# Patient Record
Sex: Female | Born: 1975
Health system: Southern US, Community
[De-identification: ages and names within clinical notes are randomized; demographics above are authoritative.]

## PROBLEM LIST (undated history)

## (undated) DIAGNOSIS — R87619 Unspecified abnormal cytological findings in specimens from cervix uteri: Secondary | ICD-10-CM

## (undated) DIAGNOSIS — Z87442 Personal history of urinary calculi: Secondary | ICD-10-CM

## (undated) DIAGNOSIS — G43909 Migraine, unspecified, not intractable, without status migrainosus: Secondary | ICD-10-CM

## (undated) DIAGNOSIS — E2740 Unspecified adrenocortical insufficiency: Secondary | ICD-10-CM

## (undated) DIAGNOSIS — C55 Malignant neoplasm of uterus, part unspecified: Secondary | ICD-10-CM

## (undated) DIAGNOSIS — M797 Fibromyalgia: Secondary | ICD-10-CM

## (undated) DIAGNOSIS — F329 Major depressive disorder, single episode, unspecified: Secondary | ICD-10-CM

## (undated) DIAGNOSIS — F32A Depression, unspecified: Secondary | ICD-10-CM

## (undated) DIAGNOSIS — K769 Liver disease, unspecified: Secondary | ICD-10-CM

## (undated) DIAGNOSIS — K219 Gastro-esophageal reflux disease without esophagitis: Secondary | ICD-10-CM

## (undated) DIAGNOSIS — D219 Benign neoplasm of connective and other soft tissue, unspecified: Secondary | ICD-10-CM

## (undated) DIAGNOSIS — N809 Endometriosis, unspecified: Secondary | ICD-10-CM

## (undated) DIAGNOSIS — R091 Pleurisy: Secondary | ICD-10-CM

## (undated) DIAGNOSIS — E7800 Pure hypercholesterolemia, unspecified: Secondary | ICD-10-CM

## (undated) DIAGNOSIS — R55 Syncope and collapse: Secondary | ICD-10-CM

## (undated) DIAGNOSIS — M1990 Unspecified osteoarthritis, unspecified site: Secondary | ICD-10-CM

## (undated) DIAGNOSIS — F419 Anxiety disorder, unspecified: Secondary | ICD-10-CM

## (undated) DIAGNOSIS — C439 Malignant melanoma of skin, unspecified: Secondary | ICD-10-CM

## (undated) DIAGNOSIS — R569 Unspecified convulsions: Secondary | ICD-10-CM

## (undated) HISTORY — DX: Unspecified convulsions: R56.9

## (undated) HISTORY — DX: Unspecified abnormal cytological findings in specimens from cervix uteri: R87.619

## (undated) HISTORY — DX: Unspecified osteoarthritis, unspecified site: M19.90

## (undated) HISTORY — DX: Benign neoplasm of connective and other soft tissue, unspecified: D21.9

## (undated) HISTORY — PX: ABDOMINAL HYSTERECTOMY: SHX81

## (undated) HISTORY — PX: LAPAROSCOPY: SHX197

## (undated) HISTORY — DX: Liver disease, unspecified: K76.9

## (undated) HISTORY — DX: Syncope and collapse: R55

## (undated) HISTORY — DX: Gastro-esophageal reflux disease without esophagitis: K21.9

## (undated) HISTORY — PX: COLECTOMY: SHX59

## (undated) HISTORY — PX: DILATION AND CURETTAGE OF UTERUS: SHX78

## (undated) HISTORY — PX: LAPAROSCOPIC GASTRIC SLEEVE RESECTION: SHX5895

## (undated) HISTORY — PX: ABLATION: SHX5711

## (undated) HISTORY — DX: Anxiety disorder, unspecified: F41.9

## (undated) HISTORY — DX: Pure hypercholesterolemia, unspecified: E78.00

## (undated) HISTORY — DX: Malignant neoplasm of uterus, part unspecified: C55

## (undated) HISTORY — DX: Pleurisy: R09.1

---

## 1998-05-13 ENCOUNTER — Other Ambulatory Visit: Admission: RE | Admit: 1998-05-13 | Discharge: 1998-05-13 | Payer: Self-pay | Admitting: Obstetrics and Gynecology

## 1998-10-03 HISTORY — PX: MELANOMA EXCISION: SHX5266

## 1999-06-02 ENCOUNTER — Other Ambulatory Visit: Admission: RE | Admit: 1999-06-02 | Discharge: 1999-06-02 | Payer: Self-pay | Admitting: Obstetrics and Gynecology

## 1999-09-06 ENCOUNTER — Encounter (INDEPENDENT_AMBULATORY_CARE_PROVIDER_SITE_OTHER): Payer: Self-pay

## 1999-09-06 ENCOUNTER — Ambulatory Visit (HOSPITAL_COMMUNITY): Admission: RE | Admit: 1999-09-06 | Discharge: 1999-09-06 | Payer: Self-pay | Admitting: Obstetrics and Gynecology

## 2000-03-04 ENCOUNTER — Emergency Department (HOSPITAL_COMMUNITY): Admission: EM | Admit: 2000-03-04 | Discharge: 2000-03-04 | Payer: Self-pay | Admitting: Emergency Medicine

## 2000-06-21 ENCOUNTER — Other Ambulatory Visit: Admission: RE | Admit: 2000-06-21 | Discharge: 2000-06-21 | Payer: Self-pay | Admitting: *Deleted

## 2001-07-03 ENCOUNTER — Other Ambulatory Visit: Admission: RE | Admit: 2001-07-03 | Discharge: 2001-07-03 | Payer: Self-pay | Admitting: Obstetrics and Gynecology

## 2002-01-26 ENCOUNTER — Inpatient Hospital Stay (HOSPITAL_COMMUNITY): Admission: AD | Admit: 2002-01-26 | Discharge: 2002-01-30 | Payer: Self-pay | Admitting: Obstetrics and Gynecology

## 2002-01-31 ENCOUNTER — Encounter: Admission: RE | Admit: 2002-01-31 | Discharge: 2002-03-02 | Payer: Self-pay | Admitting: Obstetrics and Gynecology

## 2002-03-03 ENCOUNTER — Encounter: Admission: RE | Admit: 2002-03-03 | Discharge: 2002-04-02 | Payer: Self-pay | Admitting: Obstetrics and Gynecology

## 2002-03-05 ENCOUNTER — Other Ambulatory Visit: Admission: RE | Admit: 2002-03-05 | Discharge: 2002-03-05 | Payer: Self-pay | Admitting: Obstetrics and Gynecology

## 2002-05-03 ENCOUNTER — Encounter: Admission: RE | Admit: 2002-05-03 | Discharge: 2002-06-02 | Payer: Self-pay | Admitting: Obstetrics and Gynecology

## 2002-11-01 ENCOUNTER — Other Ambulatory Visit: Admission: RE | Admit: 2002-11-01 | Discharge: 2002-11-01 | Payer: Self-pay | Admitting: Obstetrics and Gynecology

## 2003-11-28 ENCOUNTER — Other Ambulatory Visit: Admission: RE | Admit: 2003-11-28 | Discharge: 2003-11-28 | Payer: Self-pay | Admitting: Obstetrics and Gynecology

## 2005-02-15 ENCOUNTER — Other Ambulatory Visit: Admission: RE | Admit: 2005-02-15 | Discharge: 2005-02-15 | Payer: Self-pay | Admitting: *Deleted

## 2006-04-25 ENCOUNTER — Other Ambulatory Visit: Admission: RE | Admit: 2006-04-25 | Discharge: 2006-04-25 | Payer: Self-pay | Admitting: *Deleted

## 2008-07-29 ENCOUNTER — Ambulatory Visit: Payer: Self-pay

## 2008-08-05 ENCOUNTER — Ambulatory Visit: Payer: Self-pay

## 2008-08-20 ENCOUNTER — Ambulatory Visit: Payer: Self-pay

## 2008-09-03 ENCOUNTER — Ambulatory Visit: Payer: Self-pay

## 2008-09-10 ENCOUNTER — Ambulatory Visit: Payer: Self-pay

## 2008-09-17 ENCOUNTER — Ambulatory Visit: Payer: Self-pay

## 2008-09-24 ENCOUNTER — Ambulatory Visit: Payer: Self-pay

## 2008-11-18 ENCOUNTER — Ambulatory Visit: Payer: Self-pay

## 2011-01-29 ENCOUNTER — Emergency Department (HOSPITAL_COMMUNITY)
Admission: EM | Admit: 2011-01-29 | Discharge: 2011-01-29 | Disposition: A | Discharge status: Home / Self Care | Payer: Self-pay | Attending: Emergency Medicine | Admitting: Emergency Medicine

## 2011-01-29 ENCOUNTER — Emergency Department (HOSPITAL_COMMUNITY): Payer: Self-pay

## 2011-01-29 DIAGNOSIS — R42 Dizziness and giddiness: Secondary | ICD-10-CM | POA: Insufficient documentation

## 2011-01-29 DIAGNOSIS — S52123A Displaced fracture of head of unspecified radius, initial encounter for closed fracture: Secondary | ICD-10-CM | POA: Insufficient documentation

## 2011-01-29 DIAGNOSIS — IMO0002 Reserved for concepts with insufficient information to code with codable children: Secondary | ICD-10-CM | POA: Insufficient documentation

## 2011-01-29 DIAGNOSIS — S0083XA Contusion of other part of head, initial encounter: Secondary | ICD-10-CM | POA: Insufficient documentation

## 2011-01-29 DIAGNOSIS — S0003XA Contusion of scalp, initial encounter: Secondary | ICD-10-CM | POA: Insufficient documentation

## 2011-01-29 DIAGNOSIS — Y92009 Unspecified place in unspecified non-institutional (private) residence as the place of occurrence of the external cause: Secondary | ICD-10-CM | POA: Insufficient documentation

## 2011-01-29 DIAGNOSIS — Z23 Encounter for immunization: Secondary | ICD-10-CM | POA: Insufficient documentation

## 2011-01-29 DIAGNOSIS — W11XXXA Fall on and from ladder, initial encounter: Secondary | ICD-10-CM | POA: Insufficient documentation

## 2011-01-29 DIAGNOSIS — R51 Headache: Secondary | ICD-10-CM | POA: Insufficient documentation

## 2011-02-02 ENCOUNTER — Other Ambulatory Visit: Payer: Self-pay | Admitting: Orthopedic Surgery

## 2011-02-02 DIAGNOSIS — M25532 Pain in left wrist: Secondary | ICD-10-CM

## 2011-02-11 ENCOUNTER — Ambulatory Visit (HOSPITAL_COMMUNITY): Payer: Self-pay | Admitting: Specialist

## 2011-02-18 NOTE — H&P (Signed)
[facility]  Patient:    Denise Avila, Denise Avila Visit Number: [id number] MRN: [id number]          Service Type: OBS Location: 910A [id number] Attending Physician:  Genia Del Dictated by:   Lenoard Aden, M.D. Admit Date:  01/26/2002   CC:         [facility]   History and Physical  CHIEF COMPLAINT:              Worsening low back pain and worsening situational depression for induction.  HISTORY OF PRESENT ILLNESS:   The patient is a 35 year old white female, G2, P0, EDC Feb 09, 2002, at 38 weeks who presents for aforementioned indications for induction.  PAST MEDICAL HISTORY:         Remarkable for cryosurgery x 2 in 1994 and 1996, questionable history of mild endometriosis.  ALLERGIES:                    CODEINE.  MEDICATIONS:                  Prenatal vitamins and Celexa 60 mg q.d.  OB/GYN HISTORY:               History of spontaneous abortion in January 2001 without a D&E.  History of laparoscopy in December 2000 with questionable diagnosis of endometriosis.  FAMILY HISTORY:               Diabetes, heart disease, and liver cancer.  PRENATAL LABORATORY DATA:     Blood type A negative, Rh antibody negative, toxoplasmosis titer negative, VDRL nonreactive, rubella immune, hepatitis B surface antigen negative, HIV nonreactive, GC and chlamydia negative.  Group B strep performed at 35 weeks was also negative.  PREGNANCY HISTORY:            Pregnancy was complicated by size/date discrepancy, cryosurgery to the cervix, and multiple domestic situational issues to include terminal illness and death of her father who died during the pregnancy which has been a worsening problem during her pregnancy.  PHYSICAL EXAMINATION:  GENERAL:                      Well-developed, well-nourished white female in no apparent distress.  HEENT:                        Normal.  LUNGS:                        Clear.  ABDOMEN:                       Soft, gravid, nontender.  Estimated fetal weight 7-1/2 to 8 pounds.  PELVIC:                       No CVA tenderness.  Cervix is closed, 3 cm long, firm, vertex, -2.  EXTREMITIES:                  No cords.  NEUROLOGIC:                   Nonfocal.  IMPRESSION:                   1. Intrauterine pregnancy at 38 weeks.  2. Worsening situational depression on Celexa.                               3. History of cryosurgery.                               4. Unfavorable cervix.  PLAN:                         Proceed with cervical ripening in the form of Cervidil.  Attempt at vaginal delivery.  RhoGAM as needed postpartum. Increased C section risk.  Indication includes worsening emotional situation for the patient and her family which preclude the possible risk of C section and necessitate need for induction and attempted vaginal delivery. Dictated by:   Lenoard Aden, M.D. Attending Physician:  Genia Del DD:  01/25/02 TD:  01/25/02 Job: [id number]

## 2011-02-24 ENCOUNTER — Encounter (INDEPENDENT_AMBULATORY_CARE_PROVIDER_SITE_OTHER): Payer: Self-pay | Admitting: Obstetrics & Gynecology

## 2011-02-24 DIAGNOSIS — Z3480 Encounter for supervision of other normal pregnancy, unspecified trimester: Secondary | ICD-10-CM

## 2011-02-24 DIAGNOSIS — O359XX0 Maternal care for (suspected) fetal abnormality and damage, unspecified, not applicable or unspecified: Secondary | ICD-10-CM

## 2011-03-24 ENCOUNTER — Encounter: Payer: Self-pay | Admitting: Obstetrics & Gynecology

## 2011-05-18 ENCOUNTER — Encounter: Payer: Private Health Insurance - Indemnity | Admitting: Obstetrics & Gynecology

## 2011-07-20 ENCOUNTER — Other Ambulatory Visit: Payer: Self-pay | Admitting: *Deleted

## 2011-07-20 NOTE — Progress Notes (Signed)
Incision is clean and dry.  There is a small open area in the middle with a slight amount of clear drainage.  Patient is reassured.

## 2011-08-22 ENCOUNTER — Ambulatory Visit: Payer: Self-pay | Admitting: Obstetrics & Gynecology

## 2011-08-22 ENCOUNTER — Ambulatory Visit: Payer: Private Health Insurance - Indemnity | Admitting: Obstetrics & Gynecology

## 2012-02-06 DIAGNOSIS — Z0000 Encounter for general adult medical examination without abnormal findings: Secondary | ICD-10-CM | POA: Insufficient documentation

## 2014-12-02 DIAGNOSIS — R87619 Unspecified abnormal cytological findings in specimens from cervix uteri: Secondary | ICD-10-CM

## 2014-12-02 HISTORY — DX: Unspecified abnormal cytological findings in specimens from cervix uteri: R87.619

## 2014-12-08 ENCOUNTER — Other Ambulatory Visit: Payer: Self-pay | Admitting: Physician Assistant

## 2014-12-08 ENCOUNTER — Other Ambulatory Visit (HOSPITAL_COMMUNITY)
Admission: RE | Admit: 2014-12-08 | Discharge: 2014-12-08 | Disposition: A | Discharge status: Home / Self Care | Payer: BLUE CROSS/BLUE SHIELD | Source: Ambulatory Visit | Attending: Physician Assistant | Admitting: Physician Assistant

## 2014-12-08 DIAGNOSIS — Z124 Encounter for screening for malignant neoplasm of cervix: Secondary | ICD-10-CM | POA: Insufficient documentation

## 2014-12-08 DIAGNOSIS — Z1151 Encounter for screening for human papillomavirus (HPV): Secondary | ICD-10-CM | POA: Insufficient documentation

## 2014-12-09 LAB — CYTOLOGY - PAP

## 2015-02-01 ENCOUNTER — Encounter (HOSPITAL_COMMUNITY): Payer: Self-pay

## 2015-02-01 ENCOUNTER — Inpatient Hospital Stay (HOSPITAL_COMMUNITY)
Admission: EM | Admit: 2015-02-01 | Discharge: 2015-02-10 | DRG: 872 | Disposition: A | Discharge status: Home / Self Care | Payer: BLUE CROSS/BLUE SHIELD | Attending: Family Medicine | Admitting: Family Medicine

## 2015-02-01 DIAGNOSIS — N201 Calculus of ureter: Secondary | ICD-10-CM

## 2015-02-01 DIAGNOSIS — N202 Calculus of kidney with calculus of ureter: Secondary | ICD-10-CM | POA: Diagnosis present

## 2015-02-01 DIAGNOSIS — A4151 Sepsis due to Escherichia coli [E. coli]: Principal | ICD-10-CM | POA: Diagnosis present

## 2015-02-01 DIAGNOSIS — T402X5A Adverse effect of other opioids, initial encounter: Secondary | ICD-10-CM | POA: Diagnosis not present

## 2015-02-01 DIAGNOSIS — G43909 Migraine, unspecified, not intractable, without status migrainosus: Secondary | ICD-10-CM | POA: Diagnosis present

## 2015-02-01 DIAGNOSIS — T368X5A Adverse effect of other systemic antibiotics, initial encounter: Secondary | ICD-10-CM | POA: Diagnosis not present

## 2015-02-01 DIAGNOSIS — N200 Calculus of kidney: Secondary | ICD-10-CM

## 2015-02-01 DIAGNOSIS — K521 Toxic gastroenteritis and colitis: Secondary | ICD-10-CM | POA: Diagnosis not present

## 2015-02-01 DIAGNOSIS — R52 Pain, unspecified: Secondary | ICD-10-CM

## 2015-02-01 DIAGNOSIS — R091 Pleurisy: Secondary | ICD-10-CM

## 2015-02-01 DIAGNOSIS — N1330 Unspecified hydronephrosis: Secondary | ICD-10-CM | POA: Diagnosis present

## 2015-02-01 DIAGNOSIS — K5909 Other constipation: Secondary | ICD-10-CM | POA: Diagnosis not present

## 2015-02-01 DIAGNOSIS — Z880 Allergy status to penicillin: Secondary | ICD-10-CM

## 2015-02-01 DIAGNOSIS — N12 Tubulo-interstitial nephritis, not specified as acute or chronic: Secondary | ICD-10-CM | POA: Diagnosis present

## 2015-02-01 DIAGNOSIS — N3080 Other cystitis without hematuria: Secondary | ICD-10-CM | POA: Diagnosis present

## 2015-02-01 DIAGNOSIS — R1032 Left lower quadrant pain: Secondary | ICD-10-CM | POA: Diagnosis not present

## 2015-02-01 DIAGNOSIS — E876 Hypokalemia: Secondary | ICD-10-CM | POA: Diagnosis not present

## 2015-02-01 DIAGNOSIS — D696 Thrombocytopenia, unspecified: Secondary | ICD-10-CM | POA: Diagnosis present

## 2015-02-01 HISTORY — DX: Endometriosis, unspecified: N80.9

## 2015-02-01 HISTORY — DX: Pleurisy: R09.1

## 2015-02-01 LAB — URINALYSIS, ROUTINE W REFLEX MICROSCOPIC
Bilirubin Urine: NEGATIVE
Glucose, UA: NEGATIVE mg/dL
Hgb urine dipstick: NEGATIVE
Ketones, ur: >80 mg/dL — AB
Nitrite: NEGATIVE
PH: 8.5 — AB (ref 5.0–8.0)
PROTEIN: 30 mg/dL — AB
SPECIFIC GRAVITY, URINE: 1.022 (ref 1.005–1.030)
Urobilinogen, UA: 1 mg/dL (ref 0.0–1.0)

## 2015-02-01 LAB — COMPREHENSIVE METABOLIC PANEL
ALT: 20 U/L (ref 14–54)
ANION GAP: 10 (ref 5–15)
AST: 20 U/L (ref 15–41)
Albumin: 4 g/dL (ref 3.5–5.0)
Alkaline Phosphatase: 62 U/L (ref 38–126)
BUN: 20 mg/dL (ref 6–20)
CALCIUM: 9.1 mg/dL (ref 8.9–10.3)
CO2: 19 mmol/L — AB (ref 22–32)
CREATININE: 1.07 mg/dL — AB (ref 0.44–1.00)
Chloride: 109 mmol/L (ref 101–111)
Glucose, Bld: 139 mg/dL — ABNORMAL HIGH (ref 70–99)
Potassium: 3.7 mmol/L (ref 3.5–5.1)
SODIUM: 138 mmol/L (ref 135–145)
TOTAL PROTEIN: 7.1 g/dL (ref 6.5–8.1)
Total Bilirubin: 1 mg/dL (ref 0.3–1.2)

## 2015-02-01 LAB — CBC WITH DIFFERENTIAL/PLATELET
Basophils Absolute: 0 10*3/uL (ref 0.0–0.1)
Basophils Relative: 0 % (ref 0–1)
EOS PCT: 0 % (ref 0–5)
Eosinophils Absolute: 0 10*3/uL (ref 0.0–0.7)
HEMATOCRIT: 38.8 % (ref 36.0–46.0)
Hemoglobin: 13.5 g/dL (ref 12.0–15.0)
LYMPHS ABS: 0.4 10*3/uL — AB (ref 0.7–4.0)
LYMPHS PCT: 3 % — AB (ref 12–46)
MCH: 29.9 pg (ref 26.0–34.0)
MCHC: 34.8 g/dL (ref 30.0–36.0)
MCV: 85.8 fL (ref 78.0–100.0)
MONO ABS: 0.8 10*3/uL (ref 0.1–1.0)
Monocytes Relative: 6 % (ref 3–12)
Neutro Abs: 13 10*3/uL — ABNORMAL HIGH (ref 1.7–7.7)
Neutrophils Relative %: 91 % — ABNORMAL HIGH (ref 43–77)
Platelets: 165 10*3/uL (ref 150–400)
RBC: 4.52 MIL/uL (ref 3.87–5.11)
RDW: 12.5 % (ref 11.5–15.5)
WBC: 14.3 10*3/uL — AB (ref 4.0–10.5)

## 2015-02-01 LAB — URINE MICROSCOPIC-ADD ON

## 2015-02-01 LAB — LIPASE, BLOOD: Lipase: 26 U/L (ref 22–51)

## 2015-02-01 LAB — POC URINE PREG, ED: Preg Test, Ur: NEGATIVE

## 2015-02-01 MED ORDER — SODIUM CHLORIDE 0.9 % IV BOLUS (SEPSIS)
1000.0000 mL | Freq: Once | INTRAVENOUS | Status: AC
  Administered 2015-02-01: 1000 mL via INTRAVENOUS

## 2015-02-01 MED ORDER — ONDANSETRON HCL 4 MG/2ML IJ SOLN: 4.0000 mg | Freq: Once | INTRAMUSCULAR | Status: DC

## 2015-02-01 MED ORDER — HYDROMORPHONE HCL 1 MG/ML IJ SOLN
1.0000 mg | Freq: Once | INTRAMUSCULAR | Status: AC
  Administered 2015-02-01: 1 mg via INTRAVENOUS
  Filled 2015-02-01: qty 1

## 2015-02-01 MED ORDER — PROMETHAZINE HCL 25 MG/ML IJ SOLN
12.5000 mg | Freq: Once | INTRAMUSCULAR | Status: AC
  Administered 2015-02-01: 12.5 mg via INTRAVENOUS
  Filled 2015-02-01: qty 1

## 2015-02-01 MED ORDER — ONDANSETRON 4 MG PO TBDP
8.0000 mg | ORAL_TABLET | Freq: Once | ORAL | Status: AC
  Administered 2015-02-01: 8 mg via ORAL

## 2015-02-01 MED ORDER — ONDANSETRON 4 MG PO TBDP
ORAL_TABLET | ORAL | Status: AC
  Filled 2015-02-01: qty 2

## 2015-02-01 MED ORDER — IOHEXOL 300 MG/ML  SOLN: 25.0000 mL | INTRAMUSCULAR | Status: DC

## 2015-02-01 NOTE — ED Notes (Addendum)
Pt here for abd pain and emesis today. The abd pain has been bothering her the past two weeks denies any diarrhea. Denies pregnancy. Goes to MD tomorrow to discuss hysterectomy. Pt does have endometriosis and this is worse.

## 2015-02-01 NOTE — ED Provider Notes (Signed)
CSN: [id number]     Arrival date & time 02/01/15  1658 History   First MD Initiated Contact with Patient 02/01/15 1909     Chief Complaint  Patient presents with  . Abdominal Pain  . Emesis      HPI Pt here for abd pain and emesis today. The abd pain has been bothering her the past two weeks denies any diarrhea. Denies pregnancy. Goes to MD tomorrow to discuss hysterectomy. Pt does have endometriosis and this is worse.  Past Medical History  Diagnosis Date  . Endometriosis    Past Surgical History  Procedure Laterality Date  . Ablation      uterine  . Dilation and curettage of uterus     No family history on file. History  Substance Use Topics  . Smoking status: Never Smoker   . Smokeless tobacco: Not on file  . Alcohol Use: No   OB History    No data available     Review of Systems  Constitutional: Negative for fever.  Gastrointestinal: Positive for nausea, vomiting and abdominal pain. Negative for constipation and blood in stool.      Allergies  Penicillins  Home Medications   Prior to Admission medications   Not on File   BP 113/59 mmHg  Pulse 70  Temp(Src) 99.7 F (37.6 C) (Oral)  Resp 18  SpO2 97%  LMP 12/23/2014 Physical Exam  Constitutional: She is oriented to person, place, and time. She appears well-developed and well-nourished. No distress.  HENT:  Head: Normocephalic and atraumatic.  Eyes: Pupils are equal, round, and reactive to light.  Neck: Normal range of motion.  Cardiovascular: Normal rate and intact distal pulses.   Pulmonary/Chest: No respiratory distress.  Abdominal: Normal appearance. She exhibits no distension. There is tenderness in the left lower quadrant. There is no rigidity, no rebound and no guarding.  Musculoskeletal: Normal range of motion.  Neurological: She is alert and oriented to person, place, and time. No cranial nerve deficit.  Skin: Skin is warm and dry. No rash noted.  Psychiatric: She has a normal mood and  affect. Her behavior is normal.  Nursing note and vitals reviewed.   ED Course  Procedures (including critical care time)  Medications    HYDROmorphone (DILAUDID) injection 1 mg (not administered)  sodium chloride 0.9 % bolus 1,000 mL (not administered)  promethazine (PHENERGAN) injection 12.5 mg (not administered)  ondansetron (ZOFRAN-ODT) disintegrating tablet 8 mg (8 mg Oral Given 02/01/15 1854)    Labs Review Labs Reviewed  CBC WITH DIFFERENTIAL/PLATELET - Abnormal; Notable for the following:    WBC 14.3 (*)    Neutrophils Relative % 91 (*)    Neutro Abs 13.0 (*)    Lymphocytes Relative 3 (*)    Lymphs Abs 0.4 (*)    All other components within normal limits  COMPREHENSIVE METABOLIC PANEL - Abnormal; Notable for the following:    CO2 19 (*)    Glucose, Bld 139 (*)    Creatinine, Ser 1.07 (*)    All other components within normal limits  URINALYSIS, ROUTINE W REFLEX MICROSCOPIC - Abnormal; Notable for the following:    APPearance CLOUDY (*)    pH 8.5 (*)    Ketones, ur >80 (*)    Protein, ur 30 (*)    Leukocytes, UA MODERATE (*)    All other components within normal limits  URINE MICROSCOPIC-ADD ON - Abnormal; Notable for the following:    Bacteria, UA MANY (*)    All  other components within normal limits  URINE CULTURE  LIPASE, BLOOD  POC URINE PREG, ED  POC URINE PREG, ED    Imaging Review Ct Abdomen Pelvis W Contrast  02/02/2015   CLINICAL DATA:  Endometriosis, pelvic pain. Hysterectomy scheduled for tomorrow.  EXAM: CT ABDOMEN AND PELVIS WITH CONTRAST  TECHNIQUE: Multidetector CT imaging of the abdomen and pelvis was performed using the standard protocol following bolus administration of intravenous contrast.  CONTRAST:  155mL OMNIPAQUE IOHEXOL 300 MG/ML  SOLN  COMPARISON:  None.  FINDINGS: LUNG BASES: Included view of the lung bases are clear. Visualized heart and pericardium are unremarkable.  SOLID ORGANS: The liver, gallbladder, pancreas and adrenal glands are  unremarkable. 12 mm hypodensity in the spleen may reflect a cyst or lymphangioma, spleen is otherwise unremarkable.  GASTROINTESTINAL TRACT: Tiny hiatal hernia. The stomach, small and large bowel are normal in course and caliber without inflammatory changes. Enteric contrast has not yet reached the distal small bowel. Normal appendix.  KIDNEYS/ URINARY TRACT: Kidneys are orthotopic. Two delayed nephrogram on the LEFT, with mild hydroureteronephrosis and urothelial enhancement to the level of the pelvis. 6 mm calcification in the LEFT pelvis may reflect urolithiasis. Too small to characterize hypodensities in RIGHT kidney. No RIGHT obstructive uropathy. Multiple small calcifications the LEFT pelvis.  PERITONEUM/RETROPERITONEUM: Aortoiliac vessels are normal in course and caliber. No lymphadenopathy by CT size criteria. Peripherally enhancing 2.3 x 3.2 cm LEFT adnexal low low-density lesion. Small to moderate round of low-density free fluid in the pelvis without intraperitoneal free air.  SOFT TISSUE/OSSEOUS STRUCTURES: Non-suspicious. Genital piercing. Small fat containing umbilical hernia.  IMPRESSION: Small to moderate amount of free fluid in the pelvis with suspected collapsing, ruptured LEFT adnexal 3.2 cm cyst. This would be better characterized on pelvic sonogram.  LEFT pyelonephritis, mild hydronephrosis into the level of the distal ureter where a 6 mm calcification is seen, likely reflecting obstructing urolithiasis, less likely phlebolith.   Electronically Signed   By: Elon Alas   On: 02/02/2015 00:46   Spoke with Dr. Junious Silk from urology who wants the patient transferred to [facility] long emergency department.  Patient will be stented and then admitted for acute pyelonephritis.  MDM   Final diagnoses:  Pain  Pyelonephritis  Left ureteral stone        Leonard Schwartz, MD 02/02/15 [phone]

## 2015-02-01 NOTE — ED Notes (Signed)
Bedside toilet placed at bedside. Pt ambulated to bedside toilet with this RN

## 2015-02-02 ENCOUNTER — Inpatient Hospital Stay (HOSPITAL_COMMUNITY): Payer: BLUE CROSS/BLUE SHIELD | Admitting: Anesthesiology

## 2015-02-02 ENCOUNTER — Encounter (HOSPITAL_COMMUNITY)
Admission: EM | Disposition: A | Discharge status: Home / Self Care | Payer: Self-pay | Source: Home / Self Care | Attending: Internal Medicine

## 2015-02-02 ENCOUNTER — Emergency Department (HOSPITAL_COMMUNITY): Payer: BLUE CROSS/BLUE SHIELD

## 2015-02-02 ENCOUNTER — Encounter (HOSPITAL_COMMUNITY): Payer: Self-pay | Admitting: Radiology

## 2015-02-02 DIAGNOSIS — N12 Tubulo-interstitial nephritis, not specified as acute or chronic: Secondary | ICD-10-CM

## 2015-02-02 DIAGNOSIS — A4151 Sepsis due to Escherichia coli [E. coli]: Secondary | ICD-10-CM | POA: Diagnosis present

## 2015-02-02 DIAGNOSIS — N1330 Unspecified hydronephrosis: Secondary | ICD-10-CM | POA: Diagnosis present

## 2015-02-02 DIAGNOSIS — N1339 Other hydronephrosis: Secondary | ICD-10-CM | POA: Diagnosis not present

## 2015-02-02 DIAGNOSIS — G43809 Other migraine, not intractable, without status migrainosus: Secondary | ICD-10-CM | POA: Diagnosis not present

## 2015-02-02 DIAGNOSIS — K5909 Other constipation: Secondary | ICD-10-CM | POA: Diagnosis not present

## 2015-02-02 DIAGNOSIS — R1032 Left lower quadrant pain: Secondary | ICD-10-CM | POA: Diagnosis present

## 2015-02-02 DIAGNOSIS — K521 Toxic gastroenteritis and colitis: Secondary | ICD-10-CM | POA: Diagnosis not present

## 2015-02-02 DIAGNOSIS — N3080 Other cystitis without hematuria: Secondary | ICD-10-CM | POA: Diagnosis present

## 2015-02-02 DIAGNOSIS — G43909 Migraine, unspecified, not intractable, without status migrainosus: Secondary | ICD-10-CM | POA: Diagnosis present

## 2015-02-02 DIAGNOSIS — Z880 Allergy status to penicillin: Secondary | ICD-10-CM | POA: Diagnosis not present

## 2015-02-02 DIAGNOSIS — N132 Hydronephrosis with renal and ureteral calculous obstruction: Secondary | ICD-10-CM

## 2015-02-02 DIAGNOSIS — N201 Calculus of ureter: Secondary | ICD-10-CM | POA: Diagnosis not present

## 2015-02-02 DIAGNOSIS — N200 Calculus of kidney: Secondary | ICD-10-CM

## 2015-02-02 DIAGNOSIS — E876 Hypokalemia: Secondary | ICD-10-CM | POA: Diagnosis not present

## 2015-02-02 DIAGNOSIS — T368X5A Adverse effect of other systemic antibiotics, initial encounter: Secondary | ICD-10-CM | POA: Diagnosis not present

## 2015-02-02 DIAGNOSIS — D696 Thrombocytopenia, unspecified: Secondary | ICD-10-CM | POA: Diagnosis present

## 2015-02-02 DIAGNOSIS — T402X5A Adverse effect of other opioids, initial encounter: Secondary | ICD-10-CM | POA: Diagnosis not present

## 2015-02-02 DIAGNOSIS — N202 Calculus of kidney with calculus of ureter: Secondary | ICD-10-CM | POA: Diagnosis present

## 2015-02-02 HISTORY — PX: CYSTOSCOPY W/ URETERAL STENT PLACEMENT: SHX1429

## 2015-02-02 LAB — CBC
HCT: 35.3 % — ABNORMAL LOW (ref 36.0–46.0)
Hemoglobin: 11.8 g/dL — ABNORMAL LOW (ref 12.0–15.0)
MCH: 29.8 pg (ref 26.0–34.0)
MCHC: 33.4 g/dL (ref 30.0–36.0)
MCV: 89.1 fL (ref 78.0–100.0)
PLATELETS: 145 10*3/uL — AB (ref 150–400)
RBC: 3.96 MIL/uL (ref 3.87–5.11)
RDW: 12.7 % (ref 11.5–15.5)
WBC: 20.4 10*3/uL — ABNORMAL HIGH (ref 4.0–10.5)

## 2015-02-02 LAB — BASIC METABOLIC PANEL
ANION GAP: 4 — AB (ref 5–15)
BUN: 22 mg/dL — AB (ref 6–20)
CO2: 21 mmol/L — ABNORMAL LOW (ref 22–32)
Calcium: 8.1 mg/dL — ABNORMAL LOW (ref 8.9–10.3)
Chloride: 110 mmol/L (ref 101–111)
Creatinine, Ser: 0.83 mg/dL (ref 0.44–1.00)
GFR calc Af Amer: >60 mL/min (ref >60)
GFR calc non Af Amer: >60 mL/min (ref >60)
Glucose, Bld: 135 mg/dL — ABNORMAL HIGH (ref 70–99)
Potassium: 3.5 mmol/L (ref 3.5–5.1)
Sodium: 135 mmol/L (ref 135–145)

## 2015-02-02 SURGERY — CYSTOSCOPY, WITH RETROGRADE PYELOGRAM AND URETERAL STENT INSERTION
Anesthesia: General | Site: Ureter | Laterality: Left

## 2015-02-02 MED ORDER — OXYCODONE-ACETAMINOPHEN 5-325 MG PO TABS
1.0000 | ORAL_TABLET | Freq: Four times a day (QID) | ORAL | Status: DC | PRN
  Administered 2015-02-02: 2 via ORAL
  Administered 2015-02-02 – 2015-02-03 (×3): 1 via ORAL
  Filled 2015-02-02: qty 2
  Filled 2015-02-02 (×2): qty 1
  Filled 2015-02-02: qty 2
  Filled 2015-02-02: qty 1

## 2015-02-02 MED ORDER — HYDROMORPHONE HCL 1 MG/ML IJ SOLN
0.5000 mg | INTRAMUSCULAR | Status: DC | PRN
  Administered 2015-02-02 – 2015-02-03 (×2): 0.5 mg via INTRAVENOUS
  Filled 2015-02-02 (×2): qty 1

## 2015-02-02 MED ORDER — FENTANYL CITRATE (PF) 100 MCG/2ML IJ SOLN
INTRAMUSCULAR | Status: DC | PRN
  Administered 2015-02-02: 50 ug via INTRAVENOUS
  Administered 2015-02-02 (×2): 25 ug via INTRAVENOUS
  Administered 2015-02-02: 50 ug via INTRAVENOUS

## 2015-02-02 MED ORDER — PROPOFOL 10 MG/ML IV BOLUS
INTRAVENOUS | Status: AC
  Filled 2015-02-02: qty 20

## 2015-02-02 MED ORDER — TAMSULOSIN HCL 0.4 MG PO CAPS
0.4000 mg | ORAL_CAPSULE | Freq: Every day | ORAL | Status: DC
  Administered 2015-02-02 – 2015-02-09 (×8): 0.4 mg via ORAL
  Filled 2015-02-02 (×9): qty 1

## 2015-02-02 MED ORDER — SODIUM CHLORIDE 0.9 % IV SOLN
INTRAVENOUS | Status: DC
  Administered 2015-02-05 – 2015-02-08 (×5): via INTRAVENOUS

## 2015-02-02 MED ORDER — PROPOFOL 10 MG/ML IV BOLUS
INTRAVENOUS | Status: DC | PRN
  Administered 2015-02-02: 170 mg via INTRAVENOUS

## 2015-02-02 MED ORDER — LACTATED RINGERS IV SOLN: INTRAVENOUS | Status: DC

## 2015-02-02 MED ORDER — FENTANYL CITRATE (PF) 100 MCG/2ML IJ SOLN
INTRAMUSCULAR | Status: AC
Start: 2015-02-02 — End: 2015-02-02
  Filled 2015-02-02: qty 2

## 2015-02-02 MED ORDER — ALUM & MAG HYDROXIDE-SIMETH 200-200-20 MG/5ML PO SUSP: 30.0000 mL | Freq: Four times a day (QID) | ORAL | Status: DC | PRN

## 2015-02-02 MED ORDER — HYDROMORPHONE HCL 1 MG/ML IJ SOLN
0.5000 mg | INTRAMUSCULAR | Status: DC | PRN
  Administered 2015-02-02: 0.5 mg via INTRAVENOUS
  Filled 2015-02-02: qty 1

## 2015-02-02 MED ORDER — MEPERIDINE HCL 50 MG/ML IJ SOLN: 6.2500 mg | INTRAMUSCULAR | Status: DC | PRN

## 2015-02-02 MED ORDER — FENTANYL CITRATE (PF) 100 MCG/2ML IJ SOLN
INTRAMUSCULAR | Status: AC
  Filled 2015-02-02: qty 2

## 2015-02-02 MED ORDER — SCOPOLAMINE 1 MG/3DAYS TD PT72
MEDICATED_PATCH | TRANSDERMAL | Status: AC
  Filled 2015-02-02: qty 1

## 2015-02-02 MED ORDER — SODIUM CHLORIDE 0.9 % IV SOLN
INTRAVENOUS | Status: DC
  Administered 2015-02-02 – 2015-02-06 (×4): via INTRAVENOUS

## 2015-02-02 MED ORDER — PROMETHAZINE HCL 25 MG/ML IJ SOLN
12.5000 mg | Freq: Once | INTRAMUSCULAR | Status: AC
  Administered 2015-02-02: 12.5 mg via INTRAVENOUS
  Filled 2015-02-02: qty 1

## 2015-02-02 MED ORDER — DEXTROSE 5 % IV SOLN
2.0000 g | Freq: Once | INTRAVENOUS | Status: AC
  Administered 2015-02-02: 2 g via INTRAVENOUS
  Filled 2015-02-02: qty 2

## 2015-02-02 MED ORDER — IOHEXOL 300 MG/ML  SOLN
INTRAMUSCULAR | Status: DC | PRN
  Administered 2015-02-02: 50 mL

## 2015-02-02 MED ORDER — MIDAZOLAM HCL 5 MG/5ML IJ SOLN
INTRAMUSCULAR | Status: DC | PRN
  Administered 2015-02-02: 2 mg via INTRAVENOUS

## 2015-02-02 MED ORDER — IOHEXOL 300 MG/ML  SOLN
100.0000 mL | Freq: Once | INTRAMUSCULAR | Status: AC | PRN
  Administered 2015-02-02: 100 mL via INTRAVENOUS

## 2015-02-02 MED ORDER — ENOXAPARIN SODIUM 150 MG/ML ~~LOC~~ SOLN: 1.0000 mg/kg | Freq: Two times a day (BID) | SUBCUTANEOUS | Status: DC

## 2015-02-02 MED ORDER — LIDOCAINE HCL (CARDIAC) 20 MG/ML IV SOLN
INTRAVENOUS | Status: DC | PRN
  Administered 2015-02-02: 50 mg via INTRAVENOUS

## 2015-02-02 MED ORDER — ONDANSETRON HCL 4 MG/2ML IJ SOLN
4.0000 mg | Freq: Four times a day (QID) | INTRAMUSCULAR | Status: DC | PRN
  Administered 2015-02-02 – 2015-02-03 (×2): 4 mg via INTRAVENOUS
  Filled 2015-02-02 (×2): qty 2

## 2015-02-02 MED ORDER — ACETAMINOPHEN 650 MG RE SUPP: 650.0000 mg | Freq: Four times a day (QID) | RECTAL | Status: DC | PRN

## 2015-02-02 MED ORDER — ACETAMINOPHEN 325 MG PO TABS
650.0000 mg | ORAL_TABLET | Freq: Four times a day (QID) | ORAL | Status: DC | PRN
  Administered 2015-02-02 – 2015-02-05 (×6): 650 mg via ORAL
  Filled 2015-02-02 (×7): qty 2

## 2015-02-02 MED ORDER — PROMETHAZINE HCL 25 MG/ML IJ SOLN
6.2500 mg | INTRAMUSCULAR | Status: DC | PRN
Start: 2015-02-02 — End: 2015-02-02

## 2015-02-02 MED ORDER — KETOROLAC TROMETHAMINE 30 MG/ML IJ SOLN: 30.0000 mg | Freq: Once | INTRAMUSCULAR | Status: DC | PRN

## 2015-02-02 MED ORDER — SODIUM CHLORIDE 0.9 % IR SOLN
Status: DC | PRN
  Administered 2015-02-02: 3000 mL

## 2015-02-02 MED ORDER — MIDAZOLAM HCL 2 MG/2ML IJ SOLN
INTRAMUSCULAR | Status: AC
  Filled 2015-02-02: qty 2

## 2015-02-02 MED ORDER — DEXTROSE 5 % IV SOLN
1.0000 g | INTRAVENOUS | Status: DC
  Administered 2015-02-03 – 2015-02-08 (×6): 1 g via INTRAVENOUS
  Filled 2015-02-02 (×6): qty 10

## 2015-02-02 MED ORDER — KETOROLAC TROMETHAMINE 15 MG/ML IJ SOLN
15.0000 mg | Freq: Three times a day (TID) | INTRAMUSCULAR | Status: DC
Start: 2015-02-02 — End: 2015-02-05
  Administered 2015-02-02 – 2015-02-05 (×9): 15 mg via INTRAVENOUS
  Filled 2015-02-02 (×14): qty 1

## 2015-02-02 MED ORDER — LACTATED RINGERS IV SOLN
INTRAVENOUS | Status: DC | PRN
  Administered 2015-02-02: 03:00:00 via INTRAVENOUS

## 2015-02-02 MED ORDER — FENTANYL CITRATE (PF) 100 MCG/2ML IJ SOLN
100.0000 ug | Freq: Once | INTRAMUSCULAR | Status: AC
  Administered 2015-02-02: 100 ug via INTRAVENOUS
  Filled 2015-02-02: qty 2

## 2015-02-02 MED ORDER — HYDROMORPHONE HCL 1 MG/ML IJ SOLN: 0.2500 mg | INTRAMUSCULAR | Status: DC | PRN

## 2015-02-02 MED ORDER — ONDANSETRON HCL 4 MG PO TABS: 4.0000 mg | ORAL_TABLET | Freq: Four times a day (QID) | ORAL | Status: DC | PRN

## 2015-02-02 SURGICAL SUPPLY — 4 items
CATH INTERMIT  6FR 70CM (CATHETERS) ×2 IMPLANT
GUIDEWIRE STR DUAL SENSOR (WIRE) ×2 IMPLANT
PACK CYSTO (CUSTOM PROCEDURE TRAY) ×2 IMPLANT
STENT CONTOUR 6FRX26X.038 (STENTS) ×2 IMPLANT

## 2015-02-02 NOTE — Discharge Instructions (Signed)
Ureteral Stent Implantation, Care After Refer to this sheet in the next few weeks. These instructions provide you with information on caring for yourself after your procedure. Your health care provider may also give you more specific instructions. Your treatment has been planned according to current medical practices, but problems sometimes occur. Call your health care provider if you have any problems or questions after your procedure. WHAT TO EXPECT AFTER THE PROCEDURE You should be back to normal activity within 48 hours after the procedure. Nausea and vomiting may occur and are commonly the result of anesthesia. It is common to experience sharp pain in the back or lower abdomen and penis with voiding. This is caused by movement of the ends of the stent with the act of urinating.It usually goes away within minutes after you have stopped urinating. HOME CARE INSTRUCTIONS Make sure to drink plenty of fluids. You may have small amounts of bleeding, causing your urine to be red. This is normal. Certain movements may trigger pain or a feeling that you need to urinate. You may be given medicines to prevent infection or bladder spasms. Be sure to take all medicines as directed. Only take over-the-counter or prescription medicines for pain, discomfort, or fever as directed by your health care provider. Do not take aspirin, as this can make bleeding worse. Your stent will be left in until the stone is removed. This will be done in the coming weeks. The office will call with surgery date. Be sure to keep all follow-up appointments so your health care provider can check that you are healing properly. SEEK MEDICAL CARE IF:  You experience increasing pain.  Your pain medicine is not working. SEEK IMMEDIATE MEDICAL CARE IF:  Your urine is dark red or has blood clots.  You are leaking urine (incontinent).  You have a fever, chills, feeling sick to your stomach (nausea), or vomiting.  Your pain is not  relieved by pain medicine.  The end of the stent comes out of the urethra.  You are unable to urinate. Document Released: 05/22/2013 Document Revised: 09/24/2013 Document Reviewed: 05/22/2013 [facility] Patient Information 2015 [place], [place]. This information is not intended to replace advice given to you by your health care provider. Make sure you discuss any questions you have with your health care provider.

## 2015-02-02 NOTE — Anesthesia Preprocedure Evaluation (Addendum)
Anesthesia Evaluation  Patient identified by MRN, date of birth, ID band Patient awake    Reviewed: Allergy & Precautions, NPO status   Airway Mallampati: III  TM Distance: >3 FB Neck ROM: Full    Dental no notable dental hx. (+) Teeth Intact, Partial Upper,    Pulmonary neg pulmonary ROS,  breath sounds clear to auscultation  Pulmonary exam normal       Cardiovascular negative cardio ROS  Rhythm:Regular Rate:Normal     Neuro/Psych negative neurological ROS  negative psych ROS   GI/Hepatic negative GI ROS, Neg liver ROS,   Endo/Other    Renal/GU Renal disease (Left ureteral calculus)Left ureteral calculus Acute pyelonephritis  negative genitourinary   Musculoskeletal negative musculoskeletal ROS (+)   Abdominal (+)  Abdomen: soft and tender.    Peds  Hematology negative hematology ROS (+)   Anesthesia Other Findings   Reproductive/Obstetrics negative OB ROS Hx/o Endometriosis                           Anesthesia Physical Anesthesia Plan  ASA: I and emergent  Anesthesia Plan: General   Post-op Pain Management:    Induction: Intravenous  Airway Management Planned: LMA  Additional Equipment:   Intra-op Plan:   Post-operative Plan: Extubation in OR  Informed Consent: I have reviewed the patients History and Physical, chart, labs and discussed the procedure including the risks, benefits and alternatives for the proposed anesthesia with the patient or authorized representative who has indicated his/her understanding and acceptance.   Dental advisory given  Plan Discussed with: CRNA, Anesthesiologist and Surgeon  Anesthesia Plan Comments:         Anesthesia Quick Evaluation

## 2015-02-02 NOTE — Progress Notes (Signed)
Day of Surgery Subjective: Patient reports pain in LLQ and left flank with voiding. Pain better than yesterday. No fever. WBC up to 20.   Objective: Vital signs in last 24 hours: Temp:  [97.8 F (36.6 C)-99.9 F (37.7 C)] 98.8 F (37.1 C) (05/02 1344) Pulse Rate:  [56-86] 79 (05/02 1344) Resp:  [13-26] 16 (05/02 1344) BP: (95-113)/(46-68) 98/48 mmHg (05/02 1344) SpO2:  [95 %-100 %] 96 % (05/02 1344) Weight:  [95.255 kg (210 lb)] 95.255 kg (210 lb) (05/02 0455)  Intake/Output from previous day: 05/01 0701 - 05/02 0700 In: 700 [I.V.:700] Out: 275 [Urine:275] Intake/Output this shift: Total I/O In: 240 [P.O.:240] Out: 400 [Urine:400]  Physical Exam:  NAD In bed   Lab Results:  Recent Labs  02/01/15 1739 02/02/15 0745  HGB 13.5 11.8*  HCT 38.8 35.3*   BMET  Recent Labs  02/01/15 1739 02/02/15 0745  NA 138 135  K 3.7 3.5  CL 109 110  CO2 19* 21*  GLUCOSE 139* 135*  BUN 20 22*  CREATININE 1.07* 0.83  CALCIUM 9.1 8.1*   No results for input(s): LABPT, INR in the last 72 hours. No results for input(s): LABURIN in the last 72 hours. No results found for this or any previous visit.  Studies/Results: Ct Abdomen Pelvis W Contrast  02/02/2015   CLINICAL DATA:  Endometriosis, pelvic pain. Hysterectomy scheduled for tomorrow.  EXAM: CT ABDOMEN AND PELVIS WITH CONTRAST  TECHNIQUE: Multidetector CT imaging of the abdomen and pelvis was performed using the standard protocol following bolus administration of intravenous contrast.  CONTRAST:  125mL OMNIPAQUE IOHEXOL 300 MG/ML  SOLN  COMPARISON:  None.  FINDINGS: LUNG BASES: Included view of the lung bases are clear. Visualized heart and pericardium are unremarkable.  SOLID ORGANS: The liver, gallbladder, pancreas and adrenal glands are unremarkable. 12 mm hypodensity in the spleen may reflect a cyst or lymphangioma, spleen is otherwise unremarkable.  GASTROINTESTINAL TRACT: Tiny hiatal hernia. The stomach, small and large bowel  are normal in course and caliber without inflammatory changes. Enteric contrast has not yet reached the distal small bowel. Normal appendix.  KIDNEYS/ URINARY TRACT: Kidneys are orthotopic. Two delayed nephrogram on the LEFT, with mild hydroureteronephrosis and urothelial enhancement to the level of the pelvis. 6 mm calcification in the LEFT pelvis may reflect urolithiasis. Too small to characterize hypodensities in RIGHT kidney. No RIGHT obstructive uropathy. Multiple small calcifications the LEFT pelvis.  PERITONEUM/RETROPERITONEUM: Aortoiliac vessels are normal in course and caliber. No lymphadenopathy by CT size criteria. Peripherally enhancing 2.3 x 3.2 cm LEFT adnexal low low-density lesion. Small to moderate round of low-density free fluid in the pelvis without intraperitoneal free air.  SOFT TISSUE/OSSEOUS STRUCTURES: Non-suspicious. Genital piercing. Small fat containing umbilical hernia.  IMPRESSION: Small to moderate amount of free fluid in the pelvis with suspected collapsing, ruptured LEFT adnexal 3.2 cm cyst. This would be better characterized on pelvic sonogram.  LEFT pyelonephritis, mild hydronephrosis into the level of the distal ureter where a 6 mm calcification is seen, likely reflecting obstructing urolithiasis, less likely phlebolith.   Electronically Signed   By: Elon Alas   On: 02/02/2015 00:46    Assessment/Plan:  Pyelonephritis, left ureteral stone s/p left ureteral stent - cultures pending. I'll add percocet and tamsulosin for stent pain. Pain is typical of ureteral stents and should improve. I encouraged patient to ambulate in halls some.    LOS: 0 days   Shunda Rabadi 02/02/2015, 4:49 PM

## 2015-02-02 NOTE — ED Notes (Signed)
Patient transported to CT 

## 2015-02-02 NOTE — Anesthesia Postprocedure Evaluation (Signed)
  Anesthesia Post-op Note  Patient: Denise Avila  Procedure(s) Performed: Procedure(s): CYSTOSCOPY WITH RETROGRADE PYELOGRAM/URETERAL STENT PLACEMENT (Left)  Patient Location: PACU  Anesthesia Type:General  Level of Consciousness: awake, alert  and sedated  Airway and Oxygen Therapy: Patient Spontanous Breathing and Patient connected to nasal cannula oxygen  Post-op Pain: mild  Post-op Assessment: Post-op Vital signs reviewed, Patient's Cardiovascular Status Stable, Respiratory Function Stable, Patent Airway, No signs of Nausea or vomiting and Pain level controlled  Post-op Vital Signs: Reviewed and stable  Last Vitals:  Filed Vitals:   02/02/15 0400  BP: 102/58  Pulse: 83  Temp:   Resp: 19    Complications: No apparent anesthesia complications

## 2015-02-02 NOTE — Consult Note (Signed)
Consult: Left hydronephrosis, left ureteral stone, Left pyelonephritis, UTI Requested by: Dr. Audie Pinto   History of Present Illness: This is a 39 year old female with about a two-week history of left lower quadrant abdominal pain. The pain became much more severe today and she had some nausea and vomiting. She also had left flank pain. Patient has a history of endometriosis but this pain his been much worse.  Her white count was 14 and urinalysis showed many bacteria. She doesn't have any voiding complaints. CT scan of the abdomen and pelvis with IV contrast was obtained. This showed delayed uptake of contrast by the left kidney compared to the right and hydronephrosis consistent with obstruction. There appears to be a 6 mm distal stone a few centimeters from the ureterovesical junction. I reviewed all the images. Patient temperature was rising to 99.9.   Past Medical History  Diagnosis Date  . Endometriosis    Past Surgical History  Procedure Laterality Date  . Ablation      uterine  . Dilation and curettage of uterus      Home Medications:   (Not in a hospital admission) Allergies:  Allergies  Allergen Reactions  . Penicillins Rash    History reviewed. No pertinent family history. Social History:  reports that she has never smoked. She does not have any smokeless tobacco history on file. She reports that she does not drink alcohol or use illicit drugs.  ROS: A complete review of systems was performed.  All systems are negative except for pertinent findings as noted. ROS   Physical Exam:  Vital signs in last 24 hours: Temp:  [97.8 F (36.6 C)-99.9 F (37.7 C)] 98.6 F (37 C) (05/02 0232) Pulse Rate:  [56-86] 75 (05/02 0232) Resp:  [13-26] 23 (05/02 0232) BP: (95-113)/(46-68) 112/68 mmHg (05/02 0232) SpO2:  [95 %-100 %] 100 % (05/02 0232) General:  Alert and oriented, No acute distress HEENT: Normocephalic, atraumatic Neck: No JVD or lymphadenopathy Cardiovascular:  Regular rate and rhythm Lungs: Regular rate and effort Abdomen: Soft, nontender, nondistended, no abdominal masses Back: No CVA tenderness Extremities: No edema Neurologic: Grossly intact  Laboratory Data:  Results for orders placed or performed during the hospital encounter of 02/01/15 (from the past 24 hour(s))  CBC with Differential     Status: Abnormal   Collection Time: 02/01/15  5:39 PM  Result Value Ref Range   WBC 14.3 (H) 4.0 - 10.5 K/uL   RBC 4.52 3.87 - 5.11 MIL/uL   Hemoglobin 13.5 12.0 - 15.0 g/dL   HCT 38.8 36.0 - 46.0 %   MCV 85.8 78.0 - 100.0 fL   MCH 29.9 26.0 - 34.0 pg   MCHC 34.8 30.0 - 36.0 g/dL   RDW 12.5 11.5 - 15.5 %   Platelets 165 150 - 400 K/uL   Neutrophils Relative % 91 (H) 43 - 77 %   Neutro Abs 13.0 (H) 1.7 - 7.7 K/uL   Lymphocytes Relative 3 (L) 12 - 46 %   Lymphs Abs 0.4 (L) 0.7 - 4.0 K/uL   Monocytes Relative 6 3 - 12 %   Monocytes Absolute 0.8 0.1 - 1.0 K/uL   Eosinophils Relative 0 0 - 5 %   Eosinophils Absolute 0.0 0.0 - 0.7 K/uL   Basophils Relative 0 0 - 1 %   Basophils Absolute 0.0 0.0 - 0.1 K/uL  Comprehensive metabolic panel     Status: Abnormal   Collection Time: 02/01/15  5:39 PM  Result Value Ref Range  Sodium 138 135 - 145 mmol/L   Potassium 3.7 3.5 - 5.1 mmol/L   Chloride 109 101 - 111 mmol/L   CO2 19 (L) 22 - 32 mmol/L   Glucose, Bld 139 (H) 70 - 99 mg/dL   BUN 20 6 - 20 mg/dL   Creatinine, Ser 1.07 (H) 0.44 - 1.00 mg/dL   Calcium 9.1 8.9 - 10.3 mg/dL   Total Protein 7.1 6.5 - 8.1 g/dL   Albumin 4.0 3.5 - 5.0 g/dL   AST 20 15 - 41 U/L   ALT 20 14 - 54 U/L   Alkaline Phosphatase 62 38 - 126 U/L   Total Bilirubin 1.0 0.3 - 1.2 mg/dL   GFR calc non Af Amer >60 >60 mL/min   GFR calc Af Amer >60 >60 mL/min   Anion gap 10 5 - 15  Lipase, blood     Status: None   Collection Time: 02/01/15  5:39 PM  Result Value Ref Range   Lipase 26 22 - 51 U/L  Urinalysis, Routine w reflex microscopic     Status: Abnormal   Collection  Time: 02/01/15  8:04 PM  Result Value Ref Range   Color, Urine YELLOW YELLOW   APPearance CLOUDY (A) CLEAR   Specific Gravity, Urine 1.022 1.005 - 1.030   pH 8.5 (H) 5.0 - 8.0   Glucose, UA NEGATIVE NEGATIVE mg/dL   Hgb urine dipstick NEGATIVE NEGATIVE   Bilirubin Urine NEGATIVE NEGATIVE   Ketones, ur >80 (A) NEGATIVE mg/dL   Protein, ur 30 (A) NEGATIVE mg/dL   Urobilinogen, UA 1.0 0.0 - 1.0 mg/dL   Nitrite NEGATIVE NEGATIVE   Leukocytes, UA MODERATE (A) NEGATIVE  Urine microscopic-add on     Status: Abnormal   Collection Time: 02/01/15  8:04 PM  Result Value Ref Range   Squamous Epithelial / LPF RARE RARE   WBC, UA 21-50 <3 WBC/hpf   Bacteria, UA MANY (A) RARE  POC urine preg, ED (not at [facility])     Status: None   Collection Time: 02/01/15  8:37 PM  Result Value Ref Range   Preg Test, Ur NEGATIVE NEGATIVE   No results found for this or any previous visit (from the past 240 hour(s)). Creatinine:  Recent Labs  02/01/15 1739  CREATININE 1.07*    Impression/Assessment/plan:  Left pyelonephritis, UTI, left ureteral stone, left hydronephrosis - I discussed the CT findings with the patient and her family. We discussed concern for pending serious infection or sepsis given her white count, UTI, hydronephrosis and ureteral stone. We discussed the nature, potential benefits, risks and alternatives to Cystoscopy, left retrograde pyelogram and left ureteral stent, including side effects of the proposed treatment, the likelihood of the patient achieving the goals of the procedure, and any potential problems that might occur during the procedure or recuperation.  we discussed the rationale for not performing ureteroscopy and stone manipulation in the face of possible infection and in light of this the need first staged procedure with ureteroscopy at a future date. We discussed stent pain/discomfort.  I did discuss with the patient and the family there was a possibility there is no ureteral stone  as there are some pelvic phleboliths and she did not have IV contrast or delayed imaging but I do still feel stent is warranted given the signs of obstruction with delayed uptake of contrast and hydronephrosis. All questions answered. Patient elects to proceed. Urine Cx is pending. Appreciate hospitalist service admission for medical management, IV fluids, abx, etc.  Martha Ellerby 02/02/2015, 2:57 AM

## 2015-02-02 NOTE — Transfer of Care (Signed)
Immediate Anesthesia Transfer of Care Note  Patient: Rosio Weiss  Procedure(s) Performed: Procedure(s): CYSTOSCOPY WITH RETROGRADE PYELOGRAM/URETERAL STENT PLACEMENT (Left)  Patient Location: PACU  Anesthesia Type:General  Level of Consciousness:  sedated, patient cooperative and responds to stimulation  Airway & Oxygen Therapy:Patient Spontanous Breathing and Patient connected to face mask oxgen  Post-op Assessment:  Report given to PACU RN and Post -op Vital signs reviewed and stable  Post vital signs:  Reviewed and stable  Last Vitals:  Filed Vitals:   02/02/15 0232  BP: 112/68  Pulse: 75  Temp: 37 C  Resp: 23    Complications: No apparent anesthesia complications

## 2015-02-02 NOTE — Op Note (Signed)
Preoperative diagnosis: Left pyelonephritis, left hydronephrosis, left ureteral stone Postoperative diagnosis: Same  Procedure: Cystoscopy, left retrograde pyelogram, left ureteral stent placement  Surgeon: Kryslyn Helbig  Type of anesthesia: Gen.  Indication for procedure: Patient is a 39 year old female with signs of infection, left distal ureteral stone left hydronephrosis. She was brought for urgent left ureteral stent placement.  Findings: On cystoscopy the urethra was normal. The trigone and ureteral orifices were normal and in their normal orthotopic position. The bladder mucosa appeared normal apart from some diffuse mild cystitis cystica type changes seen with urinary tract infection. There were no tumors, there were no stones or foreign bodies in the bladder.  On scout imaging a calcification was seen in the left lower quadrant. There was delayed nephrogram with appearance of only the left renal cortex. On the right there was delayed nephrogram of a normal decompressed right renal pelvis and proximal right ureter. The infundibulum and calyces on the right had already drained.   Left retrograde pyelogram-this outlined a single ureter single collecting system unit with left distal ureteral stone appearing is a filling defect. The proximal ureter was dilated, the collecting system was dilated. There was a delayed nephrogram with outlined of the cortex only but after wire placement there was brisk filling of the collecting system.   description of procedure: After consent was obtained patient brought to the operating room. After adequate anesthesia she is placed in lithotomy position and prepped and draped in the usual sterile fashion. The cystoscope was inserted per urethra and the bladder inspected. The urine was drained and the bladder filled with fresh irrigation 3. The left ureteral orifice was cannulated with a 6 [place] open-ended catheter and gentle injection of contrast was performed.  Contrast hit the stone and washed back into the bladder. A sensor wire was advanced proximal to the stone and immediately after passing the wire copious amounts of thick purulent fluid drained. The wire was advanced to the collecting system. The 6 [place] open-ended catheter was advanced into the region of the left proximal ureter and the wire removed. There was a brisk hydronephrotic drip. I sent urine for culture. On imaging now the collecting system appeared as the contrast began to drain. A second gentle retrograde was performed to confirm placement in the lumen in this outlined the proximal ureter and renal pelvis and connected to the contrast in the collecting system. The collecting system was dilated but there were no filling defects. The wire was replaced and the 6 [place] open-ended catheter removed. A 6 x 26 and a meter stent was advanced. The wire was removed with a good coil seen in the left renal pelvis and a good coil in the bladder. Again copious amounts of thick white drainage continued and then began to clear. The stent holes were draining fluid briskly. The bladder was drained and the scope removed. The patient was awakened and brought to the recovery room in stable condition.   Complications: None  Blood loss: Minimal   Specimens: Left kidney aspirate for culture   Drains: 6 x 26 cm left ureteral stent   disposition: Patient stable to PACU

## 2015-02-02 NOTE — Anesthesia Procedure Notes (Signed)
Procedure Name: LMA Insertion Date/Time: 02/02/2015 3:16 AM Performed by: Anne Fu Pre-anesthesia Checklist: Patient identified, Emergency Drugs available, Suction available, Patient being monitored and Timeout performed Patient Re-evaluated:Patient Re-evaluated prior to inductionOxygen Delivery Method: Circle system utilized Preoxygenation: Pre-oxygenation with 100% oxygen Intubation Type: IV induction Ventilation: Mask ventilation without difficulty LMA: LMA inserted LMA Size: 4.0 Number of attempts: 1 Placement Confirmation: positive ETCO2 and breath sounds checked- equal and bilateral Tube secured with: Tape

## 2015-02-02 NOTE — ED Notes (Signed)
Bed: KZ60 Expected date: 02/02/15 Expected time: 1:49 AM Means of arrival: Ambulance Comments: Transfer from [place]

## 2015-02-02 NOTE — ED Notes (Signed)
Dr. Lucy Chris at bedside

## 2015-02-02 NOTE — H&P (Signed)
[facility] Admission History and Physical       Denise Avila FFM:[id number] DOB: [birth year] DOA: 02/01/2015  Referring physician: EDP PCP: No primary care provider on file.  Specialists:   Chief Complaint: Fever Chills   HPI: Denise Avila is a 39 y.o. female with a history of Endometriosis who presents to the [facility] ED with complaints of Left ABD and Flank pain x 2 weeks with fevers and chills past 24 hours with nausea and vomiting.  She was found to have Left Pyelonephritis and Hydronephrosis due to an obstructing stone.   Urology was consulted and requested transfer to [facility] for emergent Ureteral Stent placement.    Review of Systems:  Constitutional: No Weight Loss, No Weight Gain, Night Sweats, +Fevers, +Chills, Dizziness, Light Headedness, Fatigue, or Generalized Weakness HEENT: No Headaches, Difficulty Swallowing,Tooth/Dental Problems,Sore Throat,  No Sneezing, Rhinitis, Ear Ache, Nasal Congestion, or Post Nasal Drip,  Cardio-vascular:  No Chest pain, Orthopnea, PND, Edema in Lower Extremities, Anasarca, Dizziness, Palpitations  Resp: No Dyspnea, No DOE, No Productive Cough, No Non-Productive Cough, No Hemoptysis, No Wheezing.    GI: No Heartburn, Indigestion, +Abdominal Pain, +Nausea, +Vomiting, Diarrhea, Constipation, Hematemesis, Hematochezia, Melena, Change in Bowel Habits,  Loss of Appetite  GU: No Dysuria, No Change in Color of Urine, No Urgency or Urinary Frequency, + Left Flank pain.  Musculoskeletal: No Joint Pain or Swelling, No Decreased Range of Motion, No Back Pain.  Neurologic: No Syncope, No Seizures, Muscle Weakness, Paresthesia, Vision Disturbance or Loss, No Diplopia, No Vertigo, No Difficulty Walking,  Skin: No Rash or Lesions. Psych: No Change in Mood or Affect, No Depression or Anxiety, No Memory loss, No Confusion, or Hallucinations   Past Medical History  Diagnosis Date  . Endometriosis      Past Surgical History  Procedure  Laterality Date  . Ablation      uterine  . Dilation and curettage of uterus        Prior to Admission medications   Not on File     Allergies  Allergen Reactions  . Penicillins Rash    Social History:  reports that she has never smoked. She does not have any smokeless tobacco history on file. She reports that she does not drink alcohol or use illicit drugs.    History reviewed. No pertinent family history.     Physical Exam:  GEN:  Pleasant Obese  40 y.o. Caucasian female examined and in no acute distress; cooperative with exam Filed Vitals:   02/02/15 0126 02/02/15 0131 02/02/15 0132 02/02/15 0232  BP:  104/54 104/54 112/68  Pulse:  77 73 75  Temp: 99.9 F (37.7 C) 99.9 F (37.7 C)  98.6 F (37 C)  TempSrc: Oral   Oral  Resp:  26 20 23   SpO2:  99% 99% 100%   Blood pressure 112/68, pulse 75, temperature 98.6 F (37 C), temperature source Oral, resp. rate 23, last menstrual period 12/23/2014, SpO2 100 %. PSYCH: She is alert and oriented x4; does not appear anxious does not appear depressed; affect is normal HEENT: Normocephalic and Atraumatic, Mucous membranes pink; PERRLA; EOM intact; Fundi:  Benign;  No scleral icterus, Nares: Patent, Oropharynx: Clear,  Fair Dentition,    Neck:  FROM, No Cervical Lymphadenopathy nor Thyromegaly or Carotid Bruit; No JVD; Breasts:: Not examined CHEST WALL: No tenderness CHEST: Normal respiration, clear to auscultation bilaterally HEART: Regular rate and rhythm; no murmurs rubs or gallops BACK: No kyphosis or scoliosis; No CVA tenderness ABDOMEN: Positive  Bowel Sounds, Obese, Soft Non-Tender, No Rebound or Guarding; No Masses, No Organomegaly. Rectal Exam: Not done EXTREMITIES: No Cyanosis, Clubbing, or Edema; No Ulcerations. Genitalia: not examined PULSES: 2+ and symmetric SKIN: Normal hydration no rash or ulceration CNS:  Alert and Oriented x 4, No Focal Deficits Vascular: pulses palpable throughout    Labs on Admission:    Basic Metabolic Panel:  Recent Labs Lab 02/01/15 1739  NA 138  K 3.7  CL 109  CO2 19*  GLUCOSE 139*  BUN 20  CREATININE 1.07*  CALCIUM 9.1   Liver Function Tests:  Recent Labs Lab 02/01/15 1739  AST 20  ALT 20  ALKPHOS 62  BILITOT 1.0  PROT 7.1  ALBUMIN 4.0    Recent Labs Lab 02/01/15 1739  LIPASE 26   No results for input(s): AMMONIA in the last 168 hours. CBC:  Recent Labs Lab 02/01/15 1739  WBC 14.3*  NEUTROABS 13.0*  HGB 13.5  HCT 38.8  MCV 85.8  PLT 165   Cardiac Enzymes: No results for input(s): CKTOTAL, CKMB, CKMBINDEX, TROPONINI in the last 168 hours.  BNP (last 3 results) No results for input(s): BNP in the last 8760 hours.  ProBNP (last 3 results) No results for input(s): PROBNP in the last 8760 hours.  CBG: No results for input(s): GLUCAP in the last 168 hours.  Radiological Exams on Admission: Ct Abdomen Pelvis W Contrast  02/02/2015   CLINICAL DATA:  Endometriosis, pelvic pain. Hysterectomy scheduled for tomorrow.  EXAM: CT ABDOMEN AND PELVIS WITH CONTRAST  TECHNIQUE: Multidetector CT imaging of the abdomen and pelvis was performed using the standard protocol following bolus administration of intravenous contrast.  CONTRAST:  177mL OMNIPAQUE IOHEXOL 300 MG/ML  SOLN  COMPARISON:  None.  FINDINGS: LUNG BASES: Included view of the lung bases are clear. Visualized heart and pericardium are unremarkable.  SOLID ORGANS: The liver, gallbladder, pancreas and adrenal glands are unremarkable. 12 mm hypodensity in the spleen may reflect a cyst or lymphangioma, spleen is otherwise unremarkable.  GASTROINTESTINAL TRACT: Tiny hiatal hernia. The stomach, small and large bowel are normal in course and caliber without inflammatory changes. Enteric contrast has not yet reached the distal small bowel. Normal appendix.  KIDNEYS/ URINARY TRACT: Kidneys are orthotopic. Two delayed nephrogram on the LEFT, with mild hydroureteronephrosis and urothelial enhancement to  the level of the pelvis. 6 mm calcification in the LEFT pelvis may reflect urolithiasis. Too small to characterize hypodensities in RIGHT kidney. No RIGHT obstructive uropathy. Multiple small calcifications the LEFT pelvis.  PERITONEUM/RETROPERITONEUM: Aortoiliac vessels are normal in course and caliber. No lymphadenopathy by CT size criteria. Peripherally enhancing 2.3 x 3.2 cm LEFT adnexal low low-density lesion. Small to moderate round of low-density free fluid in the pelvis without intraperitoneal free air.  SOFT TISSUE/OSSEOUS STRUCTURES: Non-suspicious. Genital piercing. Small fat containing umbilical hernia.  IMPRESSION: Small to moderate amount of free fluid in the pelvis with suspected collapsing, ruptured LEFT adnexal 3.2 cm cyst. This would be better characterized on pelvic sonogram.  LEFT pyelonephritis, mild hydronephrosis into the level of the distal ureter where a 6 mm calcification is seen, likely reflecting obstructing urolithiasis, less likely phlebolith.   Electronically Signed   By: Elon Alas   On: 02/02/2015 00:46     EKG: Independently reviewed.    Assessment/Plan:   39 y.o. female with  Active Problems:   1.   Pyelonephritis/Nephrolithiasis/Hydronephrosis   Urology Dr Junious Silk for Ureteral Stent Placement   IV Abxs   IVFs  2.   Endometriosis   Pain control        3.   DVT Prophylaxis   SCDs          Code Status:     FULL CODE        Family Communication:   Family at Bedside    Disposition Plan:    Inpatient   Status        Time spent:  [facility] Pager [phone]   If [place] Please Contact the Day Rounding Team MD for [facility]  If 7PM-7AM, Please Contact Night-Floor Coverage  [email or web address] Password [email or web address] 02/02/2015, 2:59 AM     ADDENDUM:   Patient was seen and examined on 02/02/2015

## 2015-02-02 NOTE — Progress Notes (Signed)
ANTIBIOTIC CONSULT NOTE - INITIAL  Pharmacy Consult for Rocephin Indication: UTI  Allergies  Allergen Reactions  . Penicillins Rash    Patient Measurements:   Wt=pending  Vital Signs: Temp: 99.7 F (37.6 C) (05/02 0455) Temp Source: Oral (05/02 0232) BP: 104/50 mmHg (05/02 0455) Pulse Rate: 83 (05/02 0455) Intake/Output from previous day: 05/01 0701 - 05/02 0700 In: 700 [I.V.:700] Out: 275 [Urine:275] Intake/Output from this shift: Total I/O In: 700 [I.V.:700] Out: 275 [Urine:275]  Labs:  Recent Labs  02/01/15 1739  WBC 14.3*  HGB 13.5  PLT 165  CREATININE 1.07*   CrCl cannot be calculated (Unknown ideal weight.). No results for input(s): VANCOTROUGH, VANCOPEAK, VANCORANDOM, GENTTROUGH, GENTPEAK, GENTRANDOM, TOBRATROUGH, TOBRAPEAK, TOBRARND, AMIKACINPEAK, AMIKACINTROU, AMIKACIN in the last 72 hours.   Microbiology: No results found for this or any previous visit (from the past 720 hour(s)).  Medical History: Past Medical History  Diagnosis Date  . Endometriosis     Medications:  No prescriptions prior to admission   Scheduled:  . [START ON 02/03/2015] cefTRIAXone (ROCEPHIN)  IV  1 g Intravenous Q24H  . enoxaparin (LOVENOX) injection  1 mg/kg Subcutaneous Q12H   Infusions:  . sodium chloride    . sodium chloride     Assessment: 62 yoF c/o left lower quadrant abdominal pain. Rocephin per Rx for UTI.  Goal of Therapy:  Treat UTI  Plan:   Rocephin 1Gm IV q24h  F/u cultures  Dorrene German 02/02/2015,5:19 AM

## 2015-02-02 NOTE — Plan of Care (Signed)
Problem: Phase I Progression Outcomes Goal: Voiding-avoid urinary catheter unless indicated Outcome: Completed/Met Date Met:  02/02/15 Voided 0515

## 2015-02-02 NOTE — Progress Notes (Signed)
Patient seen and examined  Status post,Cystoscopy, left retrograde pyelogram, left ureteral stent placement by Festus Aloe, MD on 5/2  Plan Left pyelonephritis, left hydronephrosis, left ureteral stone Continue Rocephin Follow cultures Continue gentle hydration Minimize sedation Unfortunately no blood culture ordered, will order now   History of endometriosis stable  Leukocytosis worsening, will monitor

## 2015-02-03 ENCOUNTER — Encounter (HOSPITAL_COMMUNITY): Payer: Self-pay | Admitting: Urology

## 2015-02-03 LAB — CBC
HCT: 35.3 % — ABNORMAL LOW (ref 36.0–46.0)
Hemoglobin: 11.8 g/dL — ABNORMAL LOW (ref 12.0–15.0)
MCH: 30.2 pg (ref 26.0–34.0)
MCHC: 33.4 g/dL (ref 30.0–36.0)
MCV: 90.3 fL (ref 78.0–100.0)
Platelets: 107 10*3/uL — ABNORMAL LOW (ref 150–400)
RBC: 3.91 MIL/uL (ref 3.87–5.11)
RDW: 13.2 % (ref 11.5–15.5)
WBC: 8.3 10*3/uL (ref 4.0–10.5)

## 2015-02-03 LAB — URINE CULTURE: Colony Count: >=100000

## 2015-02-03 LAB — COMPREHENSIVE METABOLIC PANEL
ALT: 22 U/L (ref 14–54)
AST: 22 U/L (ref 15–41)
Albumin: 3.2 g/dL — ABNORMAL LOW (ref 3.5–5.0)
Alkaline Phosphatase: 59 U/L (ref 38–126)
Anion gap: 7 (ref 5–15)
BUN: 19 mg/dL (ref 6–20)
CO2: 23 mmol/L (ref 22–32)
Calcium: 8.2 mg/dL — ABNORMAL LOW (ref 8.9–10.3)
Chloride: 109 mmol/L (ref 101–111)
Creatinine, Ser: 0.98 mg/dL (ref 0.44–1.00)
GFR calc Af Amer: >60 mL/min (ref >60)
GFR calc non Af Amer: >60 mL/min (ref >60)
GLUCOSE: 117 mg/dL — AB (ref 70–99)
POTASSIUM: 3.4 mmol/L — AB (ref 3.5–5.1)
Sodium: 139 mmol/L (ref 135–145)
TOTAL PROTEIN: 6 g/dL — AB (ref 6.5–8.1)
Total Bilirubin: 0.8 mg/dL (ref 0.3–1.2)

## 2015-02-03 MED ORDER — BISACODYL 5 MG PO TBEC: 5.0000 mg | DELAYED_RELEASE_TABLET | Freq: Every day | ORAL | Status: DC | PRN

## 2015-02-03 MED ORDER — POLYETHYLENE GLYCOL 3350 17 G PO PACK
17.0000 g | PACK | Freq: Every day | ORAL | Status: DC
  Administered 2015-02-03 – 2015-02-09 (×7): 17 g via ORAL

## 2015-02-03 MED ORDER — HYDROCODONE-ACETAMINOPHEN 5-325 MG PO TABS
1.0000 | ORAL_TABLET | ORAL | Status: DC | PRN
  Administered 2015-02-03 – 2015-02-05 (×5): 2 via ORAL
  Administered 2015-02-05: 1 via ORAL
  Administered 2015-02-06 – 2015-02-10 (×9): 2 via ORAL
  Filled 2015-02-03 (×14): qty 2
  Filled 2015-02-03: qty 1

## 2015-02-03 MED ORDER — PHENAZOPYRIDINE HCL 200 MG PO TABS
200.0000 mg | ORAL_TABLET | Freq: Three times a day (TID) | ORAL | Status: AC
  Administered 2015-02-03 – 2015-02-06 (×9): 200 mg via ORAL
  Filled 2015-02-03 (×9): qty 1

## 2015-02-03 MED ORDER — DOCUSATE SODIUM 100 MG PO CAPS
100.0000 mg | ORAL_CAPSULE | Freq: Two times a day (BID) | ORAL | Status: DC
  Administered 2015-02-03 – 2015-02-04 (×3): 100 mg via ORAL

## 2015-02-03 MED ORDER — HYDROMORPHONE HCL 1 MG/ML IJ SOLN
1.0000 mg | INTRAMUSCULAR | Status: DC | PRN
  Administered 2015-02-03 – 2015-02-06 (×4): 1 mg via INTRAVENOUS
  Filled 2015-02-03 (×4): qty 1

## 2015-02-03 NOTE — Progress Notes (Addendum)
[facility] PROGRESS NOTE  Denise Avila QVZ:[id number] DOB: 08/21/1976 DOA: 02/01/2015 PCP: No primary care provider on file.  Assessment/Plan: Active Problems:   Pyelonephritis   Nephrolithiasis   Hydronephrosis    Left pyelonephritis, left hydronephrosis, left ureteral stone Continue Rocephin, urine culture shows greater than 100,000 colonies, speciation and sensitivity pending Follow cultures Continue gentle hydration Minimize sedation Blood culture 2 pending Started on flomax  Constipation :will start colace and ambulate   History of endometriosis stable  Leukocytosis resolved, WBC count normal this morning   Code Status: full Family Communication: family updated about patient's clinical progress Disposition Plan: Anticipate discharge tomorrow after the culture results   Brief narrative: 39 year old female with about a two-week history of left lower quadrant abdominal pain. The pain became much more severe today and she had some nausea and vomiting. She also had left flank pain. Patient has a history of endometriosis but this pain his been much worse.  Her white count was 14 and urinalysis showed many bacteria. She doesn't have any voiding complaints. CT scan of the abdomen and pelvis with IV contrast was obtained. This showed delayed uptake of contrast by the left kidney compared to the right and hydronephrosis consistent with obstruction. There appears to be a 6 mm distal stone a few centimeters from the ureterovesical junction  Consultants:  Urology  Procedures:  None  Antibiotics: Rocephin  HPI/Subjective: Patient reports pain in LLQ and left flank with voiding, severe not releived with toradol  Objective: Filed Vitals:   02/02/15 2143 02/03/15 0128 02/03/15 0459 02/03/15 1047  BP: 97/51 97/52 101/53 98/58  Pulse: 86 80 86 74  Temp: 99.3 F (37.4 C) 99.8 F (37.7 C) 99.6 F (37.6 C) 99.3 F (37.4 C)  TempSrc: Oral Oral Oral Oral  Resp:  16 16 16 16   Height:      Weight:      SpO2: 100% 96% 97% 98%    Intake/Output Summary (Last 24 hours) at 02/03/15 1059 Last data filed at 02/03/15 0850  Gross per 24 hour  Intake   1920 ml  Output   1300 ml  Net    620 ml    Exam:  General: No acute respiratory distress Lungs: Clear to auscultation bilaterally without wheezes or crackles Cardiovascular: Regular rate and rhythm without murmur gallop or rub normal S1 and S2 Abdomen: Nontender, nondistended, soft, bowel sounds positive, no rebound, no ascites, no appreciable mass Extremities: No significant cyanosis, clubbing, or edema bilateral lower extremities      Data Reviewed: Basic Metabolic Panel:  Recent Labs Lab 02/01/15 1739 02/02/15 0745 02/03/15 0450  NA 138 135 139  K 3.7 3.5 3.4*  CL 109 110 109  CO2 19* 21* 23  GLUCOSE 139* 135* 117*  BUN 20 22* 19  CREATININE 1.07* 0.83 0.98  CALCIUM 9.1 8.1* 8.2*    Liver Function Tests:  Recent Labs Lab 02/01/15 1739 02/03/15 0450  AST 20 22  ALT 20 22  ALKPHOS 62 59  BILITOT 1.0 0.8  PROT 7.1 6.0*  ALBUMIN 4.0 3.2*    Recent Labs Lab 02/01/15 1739  LIPASE 26   No results for input(s): AMMONIA in the last 168 hours.  CBC:  Recent Labs Lab 02/01/15 1739 02/02/15 0745 02/03/15 0450  WBC 14.3* 20.4* 8.3  NEUTROABS 13.0*  --   --   HGB 13.5 11.8* 11.8*  HCT 38.8 35.3* 35.3*  MCV 85.8 89.1 90.3  PLT 165 145* 107*    Cardiac Enzymes: No  results for input(s): CKTOTAL, CKMB, CKMBINDEX, TROPONINI in the last 168 hours. BNP (last 3 results) No results for input(s): BNP in the last 8760 hours.  ProBNP (last 3 results) No results for input(s): PROBNP in the last 8760 hours.    CBG: No results for input(s): GLUCAP in the last 168 hours.  Recent Results (from the past 240 hour(s))  Urine culture     Status: None   Collection Time: 02/01/15  8:04 PM  Result Value Ref Range Status   Specimen Description URINE, RANDOM  Final   Special  Requests ADDED 2307  Final   Colony Count   Final    >=100,000 COLONIES/ML Performed at [facility]    Culture   Final    Multiple bacterial morphotypes present, none predominant. Suggest appropriate recollection if clinically indicated. Performed at [facility]    Report Status 02/03/2015 FINAL  Final  Urine culture     Status: None (Preliminary result)   Collection Time: 02/02/15  3:31 AM  Result Value Ref Range Status   Specimen Description URINE, RANDOM CYSTOSCOPE  Final   Special Requests NONE  Final   Colony Count   Final    >=100,000 COLONIES/ML Performed at [facility]    Culture   Final    [place] Performed at [facility]    Report Status PENDING  Incomplete  Culture, blood (routine x 2)     Status: None (Preliminary result)   Collection Time: 02/02/15 11:20 AM  Result Value Ref Range Status   Specimen Description BLOOD RIGHT ARM  Final   Special Requests BOTTLES DRAWN AEROBIC AND ANAEROBIC 10CC  Final   Culture   Final           BLOOD CULTURE RECEIVED NO GROWTH TO DATE CULTURE WILL BE HELD FOR 5 DAYS BEFORE ISSUING A FINAL NEGATIVE REPORT Performed at [facility]    Report Status PENDING  Incomplete  Culture, blood (routine x 2)     Status: None (Preliminary result)   Collection Time: 02/02/15 11:23 AM  Result Value Ref Range Status   Specimen Description BLOOD RIGHT HAND  Final   Special Requests BOTTLES DRAWN AEROBIC AND ANAEROBIC 5CC  Final   Culture   Final           BLOOD CULTURE RECEIVED NO GROWTH TO DATE CULTURE WILL BE HELD FOR 5 DAYS BEFORE ISSUING A FINAL NEGATIVE REPORT Performed at [facility]    Report Status PENDING  Incomplete     Studies: Ct Abdomen Pelvis W Contrast  02/02/2015   CLINICAL DATA:  Endometriosis, pelvic pain. Hysterectomy scheduled for tomorrow.  EXAM: CT ABDOMEN AND PELVIS WITH CONTRAST  TECHNIQUE: Multidetector CT imaging of the abdomen and pelvis was performed  using the standard protocol following bolus administration of intravenous contrast.  CONTRAST:  131mL OMNIPAQUE IOHEXOL 300 MG/ML  SOLN  COMPARISON:  None.  FINDINGS: LUNG BASES: Included view of the lung bases are clear. Visualized heart and pericardium are unremarkable.  SOLID ORGANS: The liver, gallbladder, pancreas and adrenal glands are unremarkable. 12 mm hypodensity in the spleen may reflect a cyst or lymphangioma, spleen is otherwise unremarkable.  GASTROINTESTINAL TRACT: Tiny hiatal hernia. The stomach, small and large bowel are normal in course and caliber without inflammatory changes. Enteric contrast has not yet reached the distal small bowel. Normal appendix.  KIDNEYS/ URINARY TRACT: Kidneys are orthotopic. Two delayed nephrogram on the LEFT, with mild hydroureteronephrosis and urothelial  enhancement to the level of the pelvis. 6 mm calcification in the LEFT pelvis may reflect urolithiasis. Too small to characterize hypodensities in RIGHT kidney. No RIGHT obstructive uropathy. Multiple small calcifications the LEFT pelvis.  PERITONEUM/RETROPERITONEUM: Aortoiliac vessels are normal in course and caliber. No lymphadenopathy by CT size criteria. Peripherally enhancing 2.3 x 3.2 cm LEFT adnexal low low-density lesion. Small to moderate round of low-density free fluid in the pelvis without intraperitoneal free air.  SOFT TISSUE/OSSEOUS STRUCTURES: Non-suspicious. Genital piercing. Small fat containing umbilical hernia.  IMPRESSION: Small to moderate amount of free fluid in the pelvis with suspected collapsing, ruptured LEFT adnexal 3.2 cm cyst. This would be better characterized on pelvic sonogram.  LEFT pyelonephritis, mild hydronephrosis into the level of the distal ureter where a 6 mm calcification is seen, likely reflecting obstructing urolithiasis, less likely phlebolith.   Electronically Signed   By: Elon Alas   On: 02/02/2015 00:46    Scheduled Meds: . cefTRIAXone (ROCEPHIN)  IV  1 g  Intravenous Q24H  . ketorolac  15 mg Intravenous 3 times per day  . tamsulosin  0.4 mg Oral QPC supper   Continuous Infusions: . sodium chloride    . sodium chloride 100 mL/hr at 02/02/15 5638    Active Problems:   Pyelonephritis   Nephrolithiasis   Hydronephrosis    Time spent: 40 minutes   [facility] Pager [phone]. If 7PM-7AM, please contact night-coverage at [email or web address], password [facility] 02/03/2015, 10:59 AM  LOS: 1 day

## 2015-02-03 NOTE — Progress Notes (Addendum)
Patient ID: Denise Avila, female   DOB: Dec 13, 1975, 39 y.o.   MRN: [id number] Post-op note  Subjective: The patient is doing well.  She still has left flank discomfort and "pain with urination".  She is voiding without difficulty.  WBC has decreased to 8.3 today.  Urine culture showing >100k colonies but speciation and sensitivities are still pending. She thinks the percocet is making her jittery and dizzy.  She also feels bloated and has not had a BM since Sunday. She did ambulate today.  Objective: Vital signs in last 24 hours: Temp:  [98.8 F (37.1 C)-99.8 F (37.7 C)] 99.3 F (37.4 C) (05/03 1047) Pulse Rate:  [74-86] 74 (05/03 1047) Resp:  [16] 16 (05/03 1047) BP: (97-101)/(48-58) 98/58 mmHg (05/03 1047) SpO2:  [96 %-100 %] 98 % (05/03 1047)  Intake/Output from previous day: 05/02 0701 - 05/03 0700 In: 1680 [P.O.:480; I.V.:1200] Out: 1500 [Urine:1500] Intake/Output this shift: Total I/O In: 240 [P.O.:240] Out: -   Physical Exam:  General: Alert and oriented. Abdomen: Soft, Nondistended.  Lab Results:  Recent Labs  02/01/15 1739 02/02/15 0745 02/03/15 0450  HGB 13.5 11.8* 11.8*  HCT 38.8 35.3* 35.3*   CBC    Component Value Date/Time   WBC 8.3 02/03/2015 0450   RBC 3.91 02/03/2015 0450   HGB 11.8* 02/03/2015 0450   HCT 35.3* 02/03/2015 0450   PLT 107* 02/03/2015 0450   MCV 90.3 02/03/2015 0450   MCH 30.2 02/03/2015 0450   MCHC 33.4 02/03/2015 0450   RDW 13.2 02/03/2015 0450   LYMPHSABS 0.4* 02/01/2015 1739   MONOABS 0.8 02/01/2015 1739   EOSABS 0.0 02/01/2015 1739   BASOSABS 0.0 02/01/2015 1739    Assessment/Plan: Continue Rocephin and f/u urine culture  D/C percocet due sx and constipation.  Will add pyridium.  Explained to pt that if pyridium is not sufficient to alleviate stent pain can try Ultram or Toradol.   Stool softners and laxatives for constipation.     LOS: 1 day   DANCY, AMANDA 02/03/2015, 11:20 AM   Attending attestation: pt  personally seen and examined today on rounds. I agree with the above. Kidney aspirate growing > 100K GNR.

## 2015-02-04 DIAGNOSIS — A4151 Sepsis due to Escherichia coli [E. coli]: Principal | ICD-10-CM

## 2015-02-04 LAB — URINE CULTURE: Colony Count: >=100000

## 2015-02-04 MED ORDER — CITALOPRAM HYDROBROMIDE 20 MG PO TABS
20.0000 mg | ORAL_TABLET | ORAL | Status: DC
  Administered 2015-02-04 – 2015-02-09 (×6): 20 mg via ORAL
  Filled 2015-02-04 (×7): qty 1

## 2015-02-04 MED ORDER — SENNOSIDES-DOCUSATE SODIUM 8.6-50 MG PO TABS
1.0000 | ORAL_TABLET | Freq: Two times a day (BID) | ORAL | Status: DC
  Administered 2015-02-04 – 2015-02-09 (×9): 1 via ORAL
  Filled 2015-02-04 (×16): qty 1

## 2015-02-04 MED ORDER — TRAZODONE HCL 50 MG PO TABS
50.0000 mg | ORAL_TABLET | Freq: Every evening | ORAL | Status: DC | PRN
  Administered 2015-02-04 – 2015-02-09 (×6): 50 mg via ORAL
  Filled 2015-02-04 (×6): qty 1

## 2015-02-04 MED ORDER — GUAIFENESIN 100 MG/5ML PO SOLN
5.0000 mL | ORAL | Status: DC | PRN
  Administered 2015-02-04 – 2015-02-09 (×3): 100 mg via ORAL
  Filled 2015-02-04 (×2): qty 10
  Filled 2015-02-04: qty 50

## 2015-02-04 MED ORDER — CITALOPRAM HYDROBROMIDE 20 MG PO TABS
20.0000 mg | ORAL_TABLET | Freq: Every day | ORAL | Status: DC
  Filled 2015-02-04: qty 1

## 2015-02-04 NOTE — Progress Notes (Signed)
2 Days Post-Op   Subjective: Patient reports no complaints.  Feeling better.  Afebrile.  Urine culture from kidney growing greater than 100,000 gram-negative rods.  Sensitivities pending.  Objective: Vital signs in last 24 hours: Temp:  [98.4 F (36.9 C)-101.2 F (38.4 C)] 99 F (37.2 C) (05/04 0537) Pulse Rate:  [62-89] 66 (05/04 0537) Resp:  [15-24] 16 (05/04 0537) BP: (93-124)/(52-64) 93/52 mmHg (05/04 0537) SpO2:  [96 %] 96 % (05/04 0537)  Intake/Output from previous day: 05/03 0701 - 05/04 0700 In: 2040 [P.O.:840; I.V.:1200] Out: 1900 [Urine:1900] Intake/Output this shift: Total I/O In: 240 [P.O.:240] Out: -   Physical Exam:  No acute distress Watching TV, just ate breakfast  Lab Results:  Recent Labs  02/01/15 1739 02/02/15 0745 02/03/15 0450  HGB 13.5 11.8* 11.8*  HCT 38.8 35.3* 35.3*   BMET  Recent Labs  02/02/15 0745 02/03/15 0450  NA 135 139  K 3.5 3.4*  CL 110 109  CO2 21* 23  GLUCOSE 135* 117*  BUN 22* 19  CREATININE 0.83 0.98  CALCIUM 8.1* 8.2*   No results for input(s): LABPT, INR in the last 72 hours. No results for input(s): LABURIN in the last 72 hours. Results for orders placed or performed during the hospital encounter of 02/01/15  Urine culture     Status: None   Collection Time: 02/01/15  8:04 PM  Result Value Ref Range Status   Specimen Description URINE, RANDOM  Final   Special Requests ADDED 2307  Final   Colony Count   Final    >=100,000 COLONIES/ML Performed at [facility]    Culture   Final    Multiple bacterial morphotypes present, none predominant. Suggest appropriate recollection if clinically indicated. Performed at [facility]    Report Status 02/03/2015 FINAL  Final  Urine culture     Status: None (Preliminary result)   Collection Time: 02/02/15  3:31 AM  Result Value Ref Range Status   Specimen Description URINE, RANDOM CYSTOSCOPE  Final   Special Requests NONE  Final   Colony Count   Final     >=100,000 COLONIES/ML Performed at [facility]    Culture   Final    [place] Performed at [facility]    Report Status PENDING  Incomplete  Culture, blood (routine x 2)     Status: None (Preliminary result)   Collection Time: 02/02/15 11:20 AM  Result Value Ref Range Status   Specimen Description BLOOD RIGHT ARM  Final   Special Requests BOTTLES DRAWN AEROBIC AND ANAEROBIC 10CC  Final   Culture   Final           BLOOD CULTURE RECEIVED NO GROWTH TO DATE CULTURE WILL BE HELD FOR 5 DAYS BEFORE ISSUING A FINAL NEGATIVE REPORT Performed at [facility]    Report Status PENDING  Incomplete  Culture, blood (routine x 2)     Status: None (Preliminary result)   Collection Time: 02/02/15 11:23 AM  Result Value Ref Range Status   Specimen Description BLOOD RIGHT HAND  Final   Special Requests BOTTLES DRAWN AEROBIC AND ANAEROBIC 5CC  Final   Culture   Final           BLOOD CULTURE RECEIVED NO GROWTH TO DATE CULTURE WILL BE HELD FOR 5 DAYS BEFORE ISSUING A FINAL NEGATIVE REPORT Performed at [facility]    Report Status PENDING  Incomplete    Studies/Results: No results found.  Assessment -- "pyelonephritis, left ureteral stone - home on by mouth antibiotics once his sensitivity returned.  Maybe later today.   LOS: 2 days   Litsy Epting 02/04/2015, 12:01 PM

## 2015-02-04 NOTE — Progress Notes (Signed)
[facility] PROGRESS NOTE  Sona Nations HFW:[id number] DOB: 1976/09/05 DOA: 02/01/2015 PCP: No primary care provider on file.  Assessment/Plan: 39 y/o female with PMH of endometriosis presented with L flank pain, associated with nausea, vomiting -admitted with pyelonephritis/sepsis, L sided nephrolithiasis, hydronephrosis.    1. L pyelonephritis/sepsis. Febrile on 5/3. Blood cultures: NGTD. Urine cultures: E coli -cont IV atx, IVF, antiemetics. Monitor. Will transition to PO atx when able to tolerate PO. WBC-improved    2. L sided  left ureteral stone.  -5/2: Cystoscopy, left retrograde pyelogram, left ureteral stent placement; per urology  3. History of endometriosis. L sided ruptured adnexal cyst. Patient is scheduling  hysterectomy as outpatient  4. Thrombocytopenia in the setting of sepsis. No s/s fo bleeding. Cont monitor 5. Constipation. abd exam: unremarkable. Good BS. Increase bowel regimen     Code Status: full Family Communication: d/w patient, her husband  (indicate person spoken with, relationship, and if by phone, the number) Disposition Plan: home pend clinical improvement    Consultants:  Urology   Procedures:  Antibiotics:  Ceftriaxone 5/3>>>   (indicate start date, and stop date if known)  HPI/Subjective: alert  Objective: Filed Vitals:   02/04/15 1515  BP: 108/56  Pulse: 52  Temp: 98.5 F (36.9 C)  Resp: 16    Intake/Output Summary (Last 24 hours) at 02/04/15 1546 Last data filed at 02/04/15 1516  Gross per 24 hour  Intake   1920 ml  Output   1800 ml  Net    120 ml   Filed Weights   02/02/15 0455  Weight: 95.255 kg (210 lb)    Exam:   General:  alert  Cardiovascular: s1,s2 rrr  Respiratory: CTA BL  Abdomen: soft, nt,nd   Musculoskeletal: no Leg edema   Data Reviewed: Basic Metabolic Panel:  Recent Labs Lab 02/01/15 1739 02/02/15 0745 02/03/15 0450  NA 138 135 139  K 3.7 3.5 3.4*  CL 109 110 109  CO2 19* 21*  23  GLUCOSE 139* 135* 117*  BUN 20 22* 19  CREATININE 1.07* 0.83 0.98  CALCIUM 9.1 8.1* 8.2*   Liver Function Tests:  Recent Labs Lab 02/01/15 1739 02/03/15 0450  AST 20 22  ALT 20 22  ALKPHOS 62 59  BILITOT 1.0 0.8  PROT 7.1 6.0*  ALBUMIN 4.0 3.2*    Recent Labs Lab 02/01/15 1739  LIPASE 26   No results for input(s): AMMONIA in the last 168 hours. CBC:  Recent Labs Lab 02/01/15 1739 02/02/15 0745 02/03/15 0450  WBC 14.3* 20.4* 8.3  NEUTROABS 13.0*  --   --   HGB 13.5 11.8* 11.8*  HCT 38.8 35.3* 35.3*  MCV 85.8 89.1 90.3  PLT 165 145* 107*   Cardiac Enzymes: No results for input(s): CKTOTAL, CKMB, CKMBINDEX, TROPONINI in the last 168 hours. BNP (last 3 results) No results for input(s): BNP in the last 8760 hours.  ProBNP (last 3 results) No results for input(s): PROBNP in the last 8760 hours.  CBG: No results for input(s): GLUCAP in the last 168 hours.  Recent Results (from the past 240 hour(s))  Urine culture     Status: None   Collection Time: 02/01/15  8:04 PM  Result Value Ref Range Status   Specimen Description URINE, RANDOM  Final   Special Requests ADDED 2307  Final   Colony Count   Final    >=100,000 COLONIES/ML Performed at [facility]    Culture   Final    Multiple bacterial  morphotypes present, none predominant. Suggest appropriate recollection if clinically indicated. Performed at [facility]    Report Status 02/03/2015 FINAL  Final  Urine culture     Status: None   Collection Time: 02/02/15  3:31 AM  Result Value Ref Range Status   Specimen Description URINE, RANDOM CYSTOSCOPE  Final   Special Requests NONE  Final   Colony Count   Final    >=100,000 COLONIES/ML Performed at [facility]    Culture   Final    ESCHERICHIA COLI Performed at [facility]    Report Status 02/04/2015 FINAL  Final   Organism ID, Bacteria ESCHERICHIA COLI  Final      Susceptibility   Escherichia coli - MIC*     AMPICILLIN <=2 SENSITIVE Sensitive     CEFAZOLIN <=4 SENSITIVE Sensitive     CEFTRIAXONE <=1 SENSITIVE Sensitive     CIPROFLOXACIN <=0.25 SENSITIVE Sensitive     GENTAMICIN <=1 SENSITIVE Sensitive     LEVOFLOXACIN <=0.12 SENSITIVE Sensitive     NITROFURANTOIN <=16 SENSITIVE Sensitive     TOBRAMYCIN <=1 SENSITIVE Sensitive     TRIMETH/SULFA >=320 RESISTANT Resistant     PIP/TAZO <=4 SENSITIVE Sensitive     * ESCHERICHIA COLI  Culture, blood (routine x 2)     Status: None (Preliminary result)   Collection Time: 02/02/15 11:20 AM  Result Value Ref Range Status   Specimen Description BLOOD RIGHT ARM  Final   Special Requests BOTTLES DRAWN AEROBIC AND ANAEROBIC 10CC  Final   Culture   Final           BLOOD CULTURE RECEIVED NO GROWTH TO DATE CULTURE WILL BE HELD FOR 5 DAYS BEFORE ISSUING A FINAL NEGATIVE REPORT Performed at [facility]    Report Status PENDING  Incomplete  Culture, blood (routine x 2)     Status: None (Preliminary result)   Collection Time: 02/02/15 11:23 AM  Result Value Ref Range Status   Specimen Description BLOOD RIGHT HAND  Final   Special Requests BOTTLES DRAWN AEROBIC AND ANAEROBIC 5CC  Final   Culture   Final           BLOOD CULTURE RECEIVED NO GROWTH TO DATE CULTURE WILL BE HELD FOR 5 DAYS BEFORE ISSUING A FINAL NEGATIVE REPORT Performed at [facility]    Report Status PENDING  Incomplete     Studies: No results found.  Scheduled Meds: . cefTRIAXone (ROCEPHIN)  IV  1 g Intravenous Q24H  . docusate sodium  100 mg Oral BID  . ketorolac  15 mg Intravenous 3 times per day  . phenazopyridine  200 mg Oral TID WC  . polyethylene glycol  17 g Oral Daily  . tamsulosin  0.4 mg Oral QPC supper   Continuous Infusions: . sodium chloride    . sodium chloride 100 mL/hr at 02/04/15 1455    Active Problems:   Pyelonephritis   Nephrolithiasis   Hydronephrosis    Time spent: >35 minutes     Kinnie Feil  [facility] Pager  [phone]. If 7PM-7AM, please contact night-coverage at [email or web address], password [facility] 02/04/2015, 3:46 PM  LOS: 2 days

## 2015-02-05 DIAGNOSIS — G43809 Other migraine, not intractable, without status migrainosus: Secondary | ICD-10-CM

## 2015-02-05 MED ORDER — KETOROLAC TROMETHAMINE 15 MG/ML IJ SOLN
15.0000 mg | Freq: Once | INTRAMUSCULAR | Status: AC
  Administered 2015-02-05: 15 mg via INTRAVENOUS
  Filled 2015-02-05: qty 1

## 2015-02-05 MED ORDER — KETOROLAC TROMETHAMINE 15 MG/ML IJ SOLN
15.0000 mg | Freq: Four times a day (QID) | INTRAMUSCULAR | Status: AC | PRN
  Administered 2015-02-05 – 2015-02-06 (×3): 15 mg via INTRAVENOUS
  Filled 2015-02-05 (×2): qty 1

## 2015-02-05 MED ORDER — BUTALBITAL-APAP-CAFFEINE 50-325-40 MG PO TABS
1.0000 | ORAL_TABLET | Freq: Four times a day (QID) | ORAL | Status: DC | PRN
  Administered 2015-02-05 – 2015-02-10 (×9): 1 via ORAL
  Filled 2015-02-05 (×9): qty 1

## 2015-02-05 MED ORDER — BUTALBITAL-APAP-CAFFEINE 50-325-40 MG PO TABS
1.0000 | ORAL_TABLET | Freq: Once | ORAL | Status: AC
  Administered 2015-02-05: 1 via ORAL
  Filled 2015-02-05: qty 1

## 2015-02-05 NOTE — Progress Notes (Signed)
[place] for Rocephin Indication: UTI  Allergies  Allergen Reactions  . Penicillins Rash    Patient Measurements: Height: 6' (182.9 cm) Weight: 210 lb (95.255 kg) IBW/kg (Calculated) : 73.1   Vital Signs: Temp: 99.8 F (37.7 C) (05/05 0530) Temp Source: Oral (05/05 0530) BP: 111/69 mmHg (05/05 0530) Pulse Rate: 64 (05/05 0530) Intake/Output from previous day: 05/04 0701 - 05/05 0700 In: 3908.3 [P.O.:1200; I.V.:2658.3; IV Piggyback:50] Out: 2000 [Urine:2000] Intake/Output from this shift:    Labs:  Recent Labs  02/03/15 0450  WBC 8.3  HGB 11.8*  PLT 107*  CREATININE 0.98   Estimated Creatinine Clearance: 100.8 mL/min (by C-G formula based on Cr of 0.98).    Microbiology: Recent Results (from the past 720 hour(s))  Urine culture     Status: None   Collection Time: 02/01/15  8:04 PM  Result Value Ref Range Status   Specimen Description URINE, RANDOM  Final   Special Requests ADDED 2307  Final   Colony Count   Final    >=100,000 COLONIES/ML Performed at [facility]    Culture   Final    Multiple bacterial morphotypes present, none predominant. Suggest appropriate recollection if clinically indicated. Performed at [facility]    Report Status 02/03/2015 FINAL  Final  Urine culture     Status: None   Collection Time: 02/02/15  3:31 AM  Result Value Ref Range Status   Specimen Description URINE, RANDOM CYSTOSCOPE  Final   Special Requests NONE  Final   Colony Count   Final    >=100,000 COLONIES/ML Performed at [facility]    Culture   Final    ESCHERICHIA COLI Performed at [facility]    Report Status 02/04/2015 FINAL  Final   Organism ID, Bacteria ESCHERICHIA COLI  Final      Susceptibility   Escherichia coli - MIC*    AMPICILLIN <=2 SENSITIVE Sensitive     CEFAZOLIN <=4 SENSITIVE Sensitive     CEFTRIAXONE <=1 SENSITIVE Sensitive     CIPROFLOXACIN <=0.25 SENSITIVE Sensitive      GENTAMICIN <=1 SENSITIVE Sensitive     LEVOFLOXACIN <=0.12 SENSITIVE Sensitive     NITROFURANTOIN <=16 SENSITIVE Sensitive     TOBRAMYCIN <=1 SENSITIVE Sensitive     TRIMETH/SULFA >=320 RESISTANT Resistant     PIP/TAZO <=4 SENSITIVE Sensitive     * ESCHERICHIA COLI  Culture, blood (routine x 2)     Status: None (Preliminary result)   Collection Time: 02/02/15 11:20 AM  Result Value Ref Range Status   Specimen Description BLOOD RIGHT ARM  Final   Special Requests BOTTLES DRAWN AEROBIC AND ANAEROBIC 10CC  Final   Culture   Final           BLOOD CULTURE RECEIVED NO GROWTH TO DATE CULTURE WILL BE HELD FOR 5 DAYS BEFORE ISSUING A FINAL NEGATIVE REPORT Performed at [facility]    Report Status PENDING  Incomplete  Culture, blood (routine x 2)     Status: None (Preliminary result)   Collection Time: 02/02/15 11:23 AM  Result Value Ref Range Status   Specimen Description BLOOD RIGHT HAND  Final   Special Requests BOTTLES DRAWN AEROBIC AND ANAEROBIC 5CC  Final   Culture   Final           BLOOD CULTURE RECEIVED NO GROWTH TO DATE CULTURE WILL BE HELD FOR 5 DAYS BEFORE ISSUING A FINAL NEGATIVE REPORT Performed at [facility]    Report Status PENDING  Incomplete    Medical History: Past Medical History  Diagnosis Date  . Endometriosis     Medications:  Prescriptions prior to admission  Medication Sig Dispense Refill Last Dose  . citalopram (CELEXA) 20 MG tablet Take 20 mg by mouth at bedtime.   01/31/2015 at Unknown time  . traZODone (DESYREL) 50 MG tablet Take 50 mg by mouth at bedtime.   01/31/2015 at unknown   Scheduled:  . butalbital-acetaminophen-caffeine  1 tablet Oral Once  . cefTRIAXone (ROCEPHIN)  IV  1 g Intravenous Q24H  . citalopram  20 mg Oral Q24H  . ketorolac  15 mg Intravenous Once  . phenazopyridine  200 mg Oral TID WC  . polyethylene glycol  17 g Oral Daily  . senna-docusate  1 tablet Oral BID  . tamsulosin  0.4 mg Oral QPC supper    Infusions:  . sodium chloride 100 mL/hr at 02/05/15 1021  . sodium chloride 100 mL/hr at 02/05/15 0042   Assessment:39 yoF presented with lower quandrant abdominal pain x2 weeks and new n/v, flank pain.  CT showed hydronephrosis c/w obstruction of left kidney; 81mm distal stone.  Rocephin per Rx for UTI/pyelonephritis.    Goal of Therapy:  Appropriate abx dosing for indication, eradication of infection.  Today: D4 Rocephin 1g IV q24h Temp curve improving Leukocytosis resolved Still with intermittent nausea. UCx growing E.Coli (see susceptibilities above)   Plan:   Continue Rocephin 1Gm IV q24h  Anticipate de-escalation to PO once nausea resolves.  Clayburn Pert, PharmD, BCPS Pager: [phone] 02/05/2015  12:26 PM

## 2015-02-05 NOTE — Progress Notes (Signed)
[facility] PROGRESS NOTE  Alandra Sando DJS:[id number] DOB: 26-Oct-1975 DOA: 02/01/2015 PCP: No primary care provider on file.  Assessment/Plan: 39 y/o female with PMH of endometriosis presented with L flank pain, associated with nausea, vomiting -admitted with pyelonephritis/sepsis, L sided nephrolithiasis, hydronephrosis.    1. L pyelonephritis/sepsis. Blood cultures: NGTD. Urine cultures: E coli -still intermittent febrile, nauseated. Will cont IV atx, IVF, antiemetics. Monitor. Will transition to PO atx when able to tolerate PO. WBC-improved    2. L sided  left ureteral stone.  -5/2: Cystoscopy, left retrograde pyelogram, left ureteral stent placement. per urology  3. History of endometriosis. L sided ruptured adnexal cyst. Patient is scheduling  hysterectomy as outpatient  4. Thrombocytopenia in the setting of sepsis. No s/s fo bleeding. Cont monitor, repeat CBC in AM  5. Constipation. abd exam: unremarkable. Good BS. Increase bowel regimen 6. Migraines likely worse in the setting of sepsis/fever. Will try Fioricet, toradol prn      Code Status: full Family Communication: d/w patient, her husband  (indicate person spoken with, relationship, and if by phone, the number) Disposition Plan: home pend clinical improvement. 24-48 hrs   Consultants:  Urology   Procedures:  Antibiotics:  Ceftriaxone 5/3>>>   (indicate start date, and stop date if known)  HPI/Subjective: alert  Objective: Filed Vitals:   02/05/15 0530  BP: 111/69  Pulse: 64  Temp: 99.8 F (37.7 C)  Resp: 16    Intake/Output Summary (Last 24 hours) at 02/05/15 1056 Last data filed at 02/05/15 0530  Gross per 24 hour  Intake 3268.33 ml  Output   2000 ml  Net 1268.33 ml   Filed Weights   02/02/15 0455  Weight: 95.255 kg (210 lb)    Exam:   General:  alert  Cardiovascular: s1,s2 rrr  Respiratory: CTA BL  Abdomen: soft, nt,nd   Musculoskeletal: no Leg edema   Data  Reviewed: Basic Metabolic Panel:  Recent Labs Lab 02/01/15 1739 02/02/15 0745 02/03/15 0450  NA 138 135 139  K 3.7 3.5 3.4*  CL 109 110 109  CO2 19* 21* 23  GLUCOSE 139* 135* 117*  BUN 20 22* 19  CREATININE 1.07* 0.83 0.98  CALCIUM 9.1 8.1* 8.2*   Liver Function Tests:  Recent Labs Lab 02/01/15 1739 02/03/15 0450  AST 20 22  ALT 20 22  ALKPHOS 62 59  BILITOT 1.0 0.8  PROT 7.1 6.0*  ALBUMIN 4.0 3.2*    Recent Labs Lab 02/01/15 1739  LIPASE 26   No results for input(s): AMMONIA in the last 168 hours. CBC:  Recent Labs Lab 02/01/15 1739 02/02/15 0745 02/03/15 0450  WBC 14.3* 20.4* 8.3  NEUTROABS 13.0*  --   --   HGB 13.5 11.8* 11.8*  HCT 38.8 35.3* 35.3*  MCV 85.8 89.1 90.3  PLT 165 145* 107*   Cardiac Enzymes: No results for input(s): CKTOTAL, CKMB, CKMBINDEX, TROPONINI in the last 168 hours. BNP (last 3 results) No results for input(s): BNP in the last 8760 hours.  ProBNP (last 3 results) No results for input(s): PROBNP in the last 8760 hours.  CBG: No results for input(s): GLUCAP in the last 168 hours.  Recent Results (from the past 240 hour(s))  Urine culture     Status: None   Collection Time: 02/01/15  8:04 PM  Result Value Ref Range Status   Specimen Description URINE, RANDOM  Final   Special Requests ADDED 2307  Final   Colony Count   Final    >=100,000  COLONIES/ML Performed at [facility]   Final    Multiple bacterial morphotypes present, none predominant. Suggest appropriate recollection if clinically indicated. Performed at [facility]    Report Status 02/03/2015 FINAL  Final  Urine culture     Status: None   Collection Time: 02/02/15  3:31 AM  Result Value Ref Range Status   Specimen Description URINE, RANDOM CYSTOSCOPE  Final   Special Requests NONE  Final   Colony Count   Final    >=100,000 COLONIES/ML Performed at [facility]    Culture   Final    ESCHERICHIA COLI Performed at  [facility]    Report Status 02/04/2015 FINAL  Final   Organism ID, Bacteria ESCHERICHIA COLI  Final      Susceptibility   Escherichia coli - MIC*    AMPICILLIN <=2 SENSITIVE Sensitive     CEFAZOLIN <=4 SENSITIVE Sensitive     CEFTRIAXONE <=1 SENSITIVE Sensitive     CIPROFLOXACIN <=0.25 SENSITIVE Sensitive     GENTAMICIN <=1 SENSITIVE Sensitive     LEVOFLOXACIN <=0.12 SENSITIVE Sensitive     NITROFURANTOIN <=16 SENSITIVE Sensitive     TOBRAMYCIN <=1 SENSITIVE Sensitive     TRIMETH/SULFA >=320 RESISTANT Resistant     PIP/TAZO <=4 SENSITIVE Sensitive     * ESCHERICHIA COLI  Culture, blood (routine x 2)     Status: None (Preliminary result)   Collection Time: 02/02/15 11:20 AM  Result Value Ref Range Status   Specimen Description BLOOD RIGHT ARM  Final   Special Requests BOTTLES DRAWN AEROBIC AND ANAEROBIC 10CC  Final   Culture   Final           BLOOD CULTURE RECEIVED NO GROWTH TO DATE CULTURE WILL BE HELD FOR 5 DAYS BEFORE ISSUING A FINAL NEGATIVE REPORT Performed at [facility]    Report Status PENDING  Incomplete  Culture, blood (routine x 2)     Status: None (Preliminary result)   Collection Time: 02/02/15 11:23 AM  Result Value Ref Range Status   Specimen Description BLOOD RIGHT HAND  Final   Special Requests BOTTLES DRAWN AEROBIC AND ANAEROBIC 5CC  Final   Culture   Final           BLOOD CULTURE RECEIVED NO GROWTH TO DATE CULTURE WILL BE HELD FOR 5 DAYS BEFORE ISSUING A FINAL NEGATIVE REPORT Performed at [facility]    Report Status PENDING  Incomplete     Studies: No results found.  Scheduled Meds: . butalbital-acetaminophen-caffeine  1 tablet Oral Once  . cefTRIAXone (ROCEPHIN)  IV  1 g Intravenous Q24H  . citalopram  20 mg Oral Q24H  . ketorolac  15 mg Intravenous Once  . phenazopyridine  200 mg Oral TID WC  . polyethylene glycol  17 g Oral Daily  . senna-docusate  1 tablet Oral BID  . tamsulosin  0.4 mg Oral QPC supper   Continuous  Infusions: . sodium chloride 100 mL/hr at 02/05/15 1021  . sodium chloride 100 mL/hr at 02/05/15 7846    Active Problems:   Pyelonephritis   Nephrolithiasis   Hydronephrosis    Time spent: >35 minutes     Kinnie Feil  [facility] Pager [phone]. If 7PM-7AM, please contact night-coverage at [email or web address], password [facility] 02/05/2015, 10:56 AM  LOS: 3 days

## 2015-02-06 LAB — CBC
HEMATOCRIT: 30.3 % — AB (ref 36.0–46.0)
HEMOGLOBIN: 10 g/dL — AB (ref 12.0–15.0)
MCH: 29 pg (ref 26.0–34.0)
MCHC: 33 g/dL (ref 30.0–36.0)
MCV: 87.8 fL (ref 78.0–100.0)
Platelets: 146 10*3/uL — ABNORMAL LOW (ref 150–400)
RBC: 3.45 MIL/uL — AB (ref 3.87–5.11)
RDW: 13.1 % (ref 11.5–15.5)
WBC: 5.4 10*3/uL (ref 4.0–10.5)

## 2015-02-06 MED ORDER — POTASSIUM CHLORIDE CRYS ER 20 MEQ PO TBCR
40.0000 meq | EXTENDED_RELEASE_TABLET | Freq: Once | ORAL | Status: AC
  Administered 2015-02-06: 40 meq via ORAL
  Filled 2015-02-06: qty 2

## 2015-02-06 MED ORDER — HYDROMORPHONE HCL 1 MG/ML IJ SOLN
1.0000 mg | INTRAMUSCULAR | Status: DC | PRN
  Administered 2015-02-06 – 2015-02-08 (×2): 1 mg via INTRAVENOUS
  Filled 2015-02-06 (×2): qty 1

## 2015-02-06 NOTE — Progress Notes (Addendum)
Patient ID: Denise Avila, female   DOB: Jun 09, 1976, 39 y.o.   MRN: [id number]  [facility] PROGRESS NOTE  Denise Avila [id number] DOB: 1975/11/24 DOA: 02/01/2015 PCP: No primary care provider on file.   Brief narrative:    39 y/o female with PMH of endometriosis presented with 2 weeks duration of progressive and sharp, 10/10 in severity L flank pain, associated with nausea, vomiting and no specific alleviating factors, radiating to the entire ant abd area. Admitted with pyelonephritis/sepsis, L sided nephrolithiasis, hydronephrosis.   Assessment/Plan:    Active Problems:   Sepsis secondary to left nephrolithiasis, pyelonephritis - criteria for sepsis met on admission with RR as high as 26 bpm, T 99.35F, WBC 14 K, source E. Coli pyelonephritis  - started on rocephin and we are continuing same regimen - appreciate urology team following - continue to provide analgesia as needed   Nephrolithiasis, left sided ureteral stone and with hydronephrosis  - s/p left cystoscopy 5/2 with left retrograde pyelogram, left ureteral stent placement  - management per urology team     History of endometriosis - L sided ruptured adnexal cyst.  - Patient is schedulinghysterectomy as outpatient   Thrombocytopenia in the setting of sepsis - no signs of active bleeding - repeat CBC in AM   Constipation - abd exam: unremarkable. Good BS. - add bowel regimen    Migraines likely worse in the setting of sepsis/fever - continue Fioricet, toradol prn    Hypokalemia - supplement and repeat BMP in AM  DVT prophylaxis - SCD's  Code Status: Full.  Family Communication:  plan of care discussed with the patient Disposition Plan: Home when transitioned to oral ABX regimen and once tolerating PO intake   IV access:  Peripheral IV  Procedures and diagnostic studies:    Ct Abdomen Pelvis W Contrast  02/02/2015   Small to moderate amount of free fluid in the pelvis with suspected collapsing,  ruptured LEFT adnexal 3.2 cm cyst. This would be better characterized on pelvic sonogram.  LEFT pyelonephritis, mild hydronephrosis into the level of the distal ureter where a 6 mm calcification is seen, likely reflecting obstructing urolithiasis, less likely phlebolith.     Medical Consultants:  Urology   Other Consultants:  None   IAnti-Infectives:   Rocephin 5/3 -->  Faye Ramsay, MD  [facility] Pager [phone]  If 7PM-7AM, please contact night-coverage [email or web address] Password [email or web address] 02/06/2015, 5:28 PM   LOS: 4 days   HPI/Subjective: No events overnight. Still with mild left flank pain   Objective: Filed Vitals:   02/05/15 2125 02/06/15 0450 02/06/15 1302 02/06/15 1644  BP: 117/67 117/70 129/84 127/80  Pulse: 62 60 54 52  Temp: 98 F (36.7 C) 99.2 F (37.3 C) 98 F (36.7 C)   TempSrc: Oral Oral Axillary   Resp: 16 16 16    Height:      Weight:      SpO2: 100% 93% 98% 100%    Intake/Output Summary (Last 24 hours) at 02/06/15 1728 Last data filed at 02/06/15 1303  Gross per 24 hour  Intake   2060 ml  Output   1850 ml  Net    210 ml    Exam:   General:  Pt is alert, follows commands appropriately, not in acute distress  Cardiovascular: Regular rate and rhythm,  no rubs, no gallops  Respiratory: Clear to auscultation bilaterally, no wheezing, no crackles, no rhonchi  Abdomen: Soft, tender in left flank area but pt says much better now, non distended, bowel  sounds present, no guarding  Extremities: No edema, pulses DP and PT palpable bilaterally   Data Reviewed: Basic Metabolic Panel:  Recent Labs Lab 02/01/15 1739 02/02/15 0745 02/03/15 0450  NA 138 135 139  K 3.7 3.5 3.4*  CL 109 110 109  CO2 19* 21* 23  GLUCOSE 139* 135* 117*  BUN 20 22* 19  CREATININE 1.07* 0.83 0.98  CALCIUM 9.1 8.1* 8.2*   Liver Function Tests:  Recent Labs Lab 02/01/15 1739 02/03/15 0450  AST 20 22  ALT 20 22  ALKPHOS 62 59  BILITOT 1.0 0.8  PROT 7.1 6.0*   ALBUMIN 4.0 3.2*    Recent Labs Lab 02/01/15 1739  LIPASE 26   CBC:  Recent Labs Lab 02/01/15 1739 02/02/15 0745 02/03/15 0450 02/06/15 0455  WBC 14.3* 20.4* 8.3 5.4  NEUTROABS 13.0*  --   --   --   HGB 13.5 11.8* 11.8* 10.0*  HCT 38.8 35.3* 35.3* 30.3*  MCV 85.8 89.1 90.3 87.8  PLT 165 145* 107* 146*     Recent Results (from the past 240 hour(s))  Urine culture     Status: None   Collection Time: 02/01/15  8:04 PM  Result Value Ref Range Status   Specimen Description URINE, RANDOM  Final   Special Requests ADDED 2307  Final   Colony Count   Final    >=100,000 COLONIES/ML Performed at [facility]    Culture   Final    Multiple bacterial morphotypes present, none predominant. Suggest appropriate recollection if clinically indicated. Performed at [facility]    Report Status 02/03/2015 FINAL  Final  Urine culture     Status: None   Collection Time: 02/02/15  3:31 AM  Result Value Ref Range Status   Specimen Description URINE, RANDOM CYSTOSCOPE  Final   Special Requests NONE  Final   Colony Count   Final    >=100,000 COLONIES/ML Performed at [facility]    Culture   Final    ESCHERICHIA COLI Performed at [facility]    Report Status 02/04/2015 FINAL  Final   Organism ID, Bacteria ESCHERICHIA COLI  Final      Susceptibility   Escherichia coli - MIC*    AMPICILLIN <=2 SENSITIVE Sensitive     CEFAZOLIN <=4 SENSITIVE Sensitive     CEFTRIAXONE <=1 SENSITIVE Sensitive     CIPROFLOXACIN <=0.25 SENSITIVE Sensitive     GENTAMICIN <=1 SENSITIVE Sensitive     LEVOFLOXACIN <=0.12 SENSITIVE Sensitive     NITROFURANTOIN <=16 SENSITIVE Sensitive     TOBRAMYCIN <=1 SENSITIVE Sensitive     TRIMETH/SULFA >=320 RESISTANT Resistant     PIP/TAZO <=4 SENSITIVE Sensitive     * ESCHERICHIA COLI  Culture, blood (routine x 2)     Status: None (Preliminary result)   Collection Time: 02/02/15 11:20 AM  Result Value Ref Range Status    Specimen Description BLOOD RIGHT ARM  Final   Special Requests BOTTLES DRAWN AEROBIC AND ANAEROBIC 10CC  Final   Culture   Final           BLOOD CULTURE RECEIVED NO GROWTH TO DATE CULTURE WILL BE HELD FOR 5 DAYS BEFORE ISSUING A FINAL NEGATIVE REPORT Performed at [facility]    Report Status PENDING  Incomplete  Culture, blood (routine x 2)     Status: None (Preliminary result)   Collection Time: 02/02/15 11:23 AM  Result Value Ref Range Status   Specimen Description BLOOD RIGHT  HAND  Final   Special Requests BOTTLES DRAWN AEROBIC AND ANAEROBIC 5CC  Final   Culture   Final           BLOOD CULTURE RECEIVED NO GROWTH TO DATE CULTURE WILL BE HELD FOR 5 DAYS BEFORE ISSUING A FINAL NEGATIVE REPORT Performed at [facility]    Report Status PENDING  Incomplete     Scheduled Meds: . cefTRIAXone (ROCEPHIN)  IV  1 g Intravenous Q24H  . citalopram  20 mg Oral Q24H  . polyethylene glycol  17 g Oral Daily  . senna-docusate  1 tablet Oral BID  . tamsulosin  0.4 mg Oral QPC supper   Continuous Infusions: . sodium chloride 100 mL/hr at 02/06/15 0003  . sodium chloride 100 mL/hr at 02/06/15 1019

## 2015-02-07 LAB — BASIC METABOLIC PANEL
ANION GAP: 8 (ref 5–15)
BUN: 5 mg/dL — ABNORMAL LOW (ref 6–20)
CALCIUM: 8.2 mg/dL — AB (ref 8.9–10.3)
CO2: 24 mmol/L (ref 22–32)
CREATININE: 0.5 mg/dL (ref 0.44–1.00)
Chloride: 109 mmol/L (ref 101–111)
GFR calc non Af Amer: >60 mL/min (ref >60)
Glucose, Bld: 100 mg/dL — ABNORMAL HIGH (ref 70–99)
Potassium: 3.4 mmol/L — ABNORMAL LOW (ref 3.5–5.1)
Sodium: 141 mmol/L (ref 135–145)

## 2015-02-07 LAB — CBC
HCT: 32 % — ABNORMAL LOW (ref 36.0–46.0)
Hemoglobin: 10.6 g/dL — ABNORMAL LOW (ref 12.0–15.0)
MCH: 28.8 pg (ref 26.0–34.0)
MCHC: 33.1 g/dL (ref 30.0–36.0)
MCV: 87 fL (ref 78.0–100.0)
PLATELETS: 166 10*3/uL (ref 150–400)
RBC: 3.68 MIL/uL — AB (ref 3.87–5.11)
RDW: 13 % (ref 11.5–15.5)
WBC: 4.5 10*3/uL (ref 4.0–10.5)

## 2015-02-07 MED ORDER — POTASSIUM CHLORIDE CRYS ER 20 MEQ PO TBCR
40.0000 meq | EXTENDED_RELEASE_TABLET | Freq: Two times a day (BID) | ORAL | Status: AC
  Administered 2015-02-07 (×2): 40 meq via ORAL
  Filled 2015-02-07 (×2): qty 2

## 2015-02-07 MED ORDER — KETOROLAC TROMETHAMINE 15 MG/ML IJ SOLN
15.0000 mg | Freq: Four times a day (QID) | INTRAMUSCULAR | Status: AC | PRN
  Administered 2015-02-07 – 2015-02-09 (×4): 15 mg via INTRAVENOUS
  Filled 2015-02-07 (×4): qty 1

## 2015-02-07 NOTE — Progress Notes (Signed)
Dear Doctor: Doyle Askew, This patient has been identified as a candidate for Midline  for the following reason (s): poor veins/poor circulatory system (CHF, COPD, emphysema, diabetes, steroid use, IV drug abuse, etc.) If you agree, please write an order for the indicated device. For any questions contact the Vascular Access Team at [phone] if no answer, please leave a message.  This patient now has iv in upper arm shoulder area.  Difficult veins.  Thank you for supporting the early vascular access assessment program.

## 2015-02-07 NOTE — Progress Notes (Signed)
Patient ID: Denise Avila, female   DOB: [birth year], 39 y.o.   MRN: [id number]  [facility] PROGRESS NOTE  Denise Avila [id number] DOB: June 03, 1976 DOA: 02/01/2015 PCP: No primary care provider on file.   Brief narrative:    39 y/o female with PMH of endometriosis presented with 2 weeks duration of progressive and sharp, 10/10 in severity L flank pain, associated with nausea, vomiting and no specific alleviating factors, radiating to the entire ant abd area. Admitted with pyelonephritis/sepsis, L sided nephrolithiasis, hydronephrosis.   Assessment/Plan:    Active Problems:   Sepsis secondary to left nephrolithiasis, pyelonephritis - criteria for sepsis met on admission with RR as high as 26 bpm, T 99.46F, WBC 14 K, source E. Coli pyelonephritis  - started on rocephin, pt still with poor oral intake so will hold off transitioning to PO ABX today and will reassess in AM - appreciate urology team following - continue to provide analgesia as needed   Nephrolithiasis, left sided ureteral stone and with hydronephrosis  - s/p left cystoscopy 5/2 with left retrograde pyelogram, left ureteral stent placement  - management per urology team     History of endometriosis - L sided ruptured adnexal cyst.  - Patient is schedulinghysterectomy as outpatient   Thrombocytopenia in the setting of sepsis - no signs of active bleeding, resolved  - repeat CBC in AM   Constipation - abd exam: unremarkable. Good BS. - has small BM yesterday    Migraines likely worse in the setting of sepsis/fever - continue Fioricet, toradol prn    Hypokalemia - continue to supplement and repeat BMP in AM  DVT prophylaxis - SCD's  Code Status: Full.  Family Communication:  plan of care discussed with the patient Disposition Plan: Home when transitioned to oral ABX regimen and once tolerating PO intake better  IV access:  Peripheral IV  Procedures and diagnostic studies:    Ct Abdomen Pelvis W  Contrast  02/02/2015   Small to moderate amount of free fluid in the pelvis with suspected collapsing, ruptured LEFT adnexal 3.2 cm cyst. This would be better characterized on pelvic sonogram.  LEFT pyelonephritis, mild hydronephrosis into the level of the distal ureter where a 6 mm calcification is seen, likely reflecting obstructing urolithiasis, less likely phlebolith.     Medical Consultants:  Urology   Other Consultants:  None   IAnti-Infectives:   Rocephin 5/3 -->  Faye Ramsay, MD  [facility] Pager [phone]  If 7PM-7AM, please contact night-coverage [email or web address] Password [email or web address] 02/07/2015, 11:11 AM   LOS: 5 days   HPI/Subjective: No events overnight. Still with mild left flank pain   Objective: Filed Vitals:   02/06/15 1302 02/06/15 1644 02/06/15 2231 02/07/15 0438  BP: 129/84 127/80 128/57 125/70  Pulse: 54 52 59 55  Temp: 98 F (36.7 C)  100.2 F (37.9 C) 99.4 F (37.4 C)  TempSrc: Axillary  Oral Oral  Resp: 16  16 16   Height:      Weight:      SpO2: 98% 100% 99% 97%    Intake/Output Summary (Last 24 hours) at 02/07/15 1111 Last data filed at 02/07/15 0549  Gross per 24 hour  Intake 1846.66 ml  Output   1850 ml  Net  -3.34 ml    Exam:   General:  Pt is alert, follows commands appropriately, not in acute distress  Cardiovascular: Regular rate and rhythm,  no rubs, no gallops  Respiratory: Clear to auscultation bilaterally, no wheezing, no crackles, no rhonchi  Abdomen:  Soft, tender in left flank area but improved, non distended, bowel sounds present, no guarding  Extremities: No edema, pulses DP and PT palpable bilaterally   Data Reviewed: Basic Metabolic Panel:  Recent Labs Lab 02/01/15 1739 02/02/15 0745 02/03/15 0450 02/07/15 0423  NA 138 135 139 141  K 3.7 3.5 3.4* 3.4*  CL 109 110 109 109  CO2 19* 21* 23 24  GLUCOSE 139* 135* 117* 100*  BUN 20 22* 19 5*  CREATININE 1.07* 0.83 0.98 0.50  CALCIUM 9.1 8.1* 8.2* 8.2*   Liver  Function Tests:  Recent Labs Lab 02/01/15 1739 02/03/15 0450  AST 20 22  ALT 20 22  ALKPHOS 62 59  BILITOT 1.0 0.8  PROT 7.1 6.0*  ALBUMIN 4.0 3.2*    Recent Labs Lab 02/01/15 1739  LIPASE 26   CBC:  Recent Labs Lab 02/01/15 1739 02/02/15 0745 02/03/15 0450 02/06/15 0455 02/07/15 0423  WBC 14.3* 20.4* 8.3 5.4 4.5  NEUTROABS 13.0*  --   --   --   --   HGB 13.5 11.8* 11.8* 10.0* 10.6*  HCT 38.8 35.3* 35.3* 30.3* 32.0*  MCV 85.8 89.1 90.3 87.8 87.0  PLT 165 145* 107* 146* 166     Recent Results (from the past 240 hour(s))  Urine culture     Status: None   Collection Time: 02/01/15  8:04 PM  Result Value Ref Range Status   Specimen Description URINE, RANDOM  Final   Special Requests ADDED 2307  Final   Colony Count   Final    >=100,000 COLONIES/ML Performed at [facility]    Culture   Final    Multiple bacterial morphotypes present, none predominant. Suggest appropriate recollection if clinically indicated. Performed at [facility]    Report Status 02/03/2015 FINAL  Final  Urine culture     Status: None   Collection Time: 02/02/15  3:31 AM  Result Value Ref Range Status   Specimen Description URINE, RANDOM CYSTOSCOPE  Final   Special Requests NONE  Final   Colony Count   Final    >=100,000 COLONIES/ML Performed at [facility]    Culture   Final    ESCHERICHIA COLI Performed at [facility]    Report Status 02/04/2015 FINAL  Final   Organism ID, Bacteria ESCHERICHIA COLI  Final      Susceptibility   Escherichia coli - MIC*    AMPICILLIN <=2 SENSITIVE Sensitive     CEFAZOLIN <=4 SENSITIVE Sensitive     CEFTRIAXONE <=1 SENSITIVE Sensitive     CIPROFLOXACIN <=0.25 SENSITIVE Sensitive     GENTAMICIN <=1 SENSITIVE Sensitive     LEVOFLOXACIN <=0.12 SENSITIVE Sensitive     NITROFURANTOIN <=16 SENSITIVE Sensitive     TOBRAMYCIN <=1 SENSITIVE Sensitive     TRIMETH/SULFA >=320 RESISTANT Resistant     PIP/TAZO <=4  SENSITIVE Sensitive     * ESCHERICHIA COLI  Culture, blood (routine x 2)     Status: None (Preliminary result)   Collection Time: 02/02/15 11:20 AM  Result Value Ref Range Status   Specimen Description BLOOD RIGHT ARM  Final   Special Requests BOTTLES DRAWN AEROBIC AND ANAEROBIC 10CC  Final   Culture   Final           BLOOD CULTURE RECEIVED NO GROWTH TO DATE CULTURE WILL BE HELD FOR 5 DAYS BEFORE ISSUING A FINAL NEGATIVE REPORT Performed at [facility]    Report Status PENDING  Incomplete  Culture,  blood (routine x 2)     Status: None (Preliminary result)   Collection Time: 02/02/15 11:23 AM  Result Value Ref Range Status   Specimen Description BLOOD RIGHT HAND  Final   Special Requests BOTTLES DRAWN AEROBIC AND ANAEROBIC 5CC  Final   Culture   Final           BLOOD CULTURE RECEIVED NO GROWTH TO DATE CULTURE WILL BE HELD FOR 5 DAYS BEFORE ISSUING A FINAL NEGATIVE REPORT Performed at [facility]    Report Status PENDING  Incomplete     Scheduled Meds: . cefTRIAXone (ROCEPHIN)  IV  1 g Intravenous Q24H  . citalopram  20 mg Oral Q24H  . polyethylene glycol  17 g Oral Daily  . senna-docusate  1 tablet Oral BID  . tamsulosin  0.4 mg Oral QPC supper   Continuous Infusions: . sodium chloride 50 mL/hr at 02/06/15 2204

## 2015-02-07 NOTE — Progress Notes (Signed)
5 Days Post-Op Subjective: Patient reports her discharge has been slowed bu headache, nausea, poor po. She said her flank pain and lower urinary tract symptoms have resolved.   Objective: Vital signs in last 24 hours: Temp:  [98 F (36.7 C)-100.2 F (37.9 C)] 99.4 F (37.4 C) (05/07 0438) Pulse Rate:  [52-59] 55 (05/07 0438) Resp:  [16] 16 (05/07 0438) BP: (125-129)/(57-84) 125/70 mmHg (05/07 0438) SpO2:  [97 %-100 %] 97 % (05/07 0438)  Intake/Output from previous day: 05/06 0701 - 05/07 0700 In: 2266.7 [P.O.:480; I.V.:1686.7; IV Piggyback:100] Out: 2550 [Urine:2550] Intake/Output this shift:    Physical Exam:  NAD Texting on her phone  Lab Results:  Recent Labs  02/06/15 0455 02/07/15 0423  HGB 10.0* 10.6*  HCT 30.3* 32.0*   BMET  Recent Labs  02/07/15 0423  NA 141  K 3.4*  CL 109  CO2 24  GLUCOSE 100*  BUN 5*  CREATININE 0.50  CALCIUM 8.2*   No results for input(s): LABPT, INR in the last 72 hours. No results for input(s): LABURIN in the last 72 hours. Results for orders placed or performed during the hospital encounter of 02/01/15  Urine culture     Status: None   Collection Time: 02/01/15  8:04 PM  Result Value Ref Range Status   Specimen Description URINE, RANDOM  Final   Special Requests ADDED 2307  Final   Colony Count   Final    >=100,000 COLONIES/ML Performed at [facility]    Culture   Final    Multiple bacterial morphotypes present, none predominant. Suggest appropriate recollection if clinically indicated. Performed at [facility]    Report Status 02/03/2015 FINAL  Final  Urine culture     Status: None   Collection Time: 02/02/15  3:31 AM  Result Value Ref Range Status   Specimen Description URINE, RANDOM CYSTOSCOPE  Final   Special Requests NONE  Final   Colony Count   Final    >=100,000 COLONIES/ML Performed at [facility]    Culture   Final    ESCHERICHIA COLI Performed at [facility]    Report Status 02/04/2015 FINAL  Final   Organism ID, Bacteria ESCHERICHIA COLI  Final      Susceptibility   Escherichia coli - MIC*    AMPICILLIN <=2 SENSITIVE Sensitive     CEFAZOLIN <=4 SENSITIVE Sensitive     CEFTRIAXONE <=1 SENSITIVE Sensitive     CIPROFLOXACIN <=0.25 SENSITIVE Sensitive     GENTAMICIN <=1 SENSITIVE Sensitive     LEVOFLOXACIN <=0.12 SENSITIVE Sensitive     NITROFURANTOIN <=16 SENSITIVE Sensitive     TOBRAMYCIN <=1 SENSITIVE Sensitive     TRIMETH/SULFA >=320 RESISTANT Resistant     PIP/TAZO <=4 SENSITIVE Sensitive     * ESCHERICHIA COLI  Culture, blood (routine x 2)     Status: None (Preliminary result)   Collection Time: 02/02/15 11:20 AM  Result Value Ref Range Status   Specimen Description BLOOD RIGHT ARM  Final   Special Requests BOTTLES DRAWN AEROBIC AND ANAEROBIC 10CC  Final   Culture   Final           BLOOD CULTURE RECEIVED NO GROWTH TO DATE CULTURE WILL BE HELD FOR 5 DAYS BEFORE ISSUING A FINAL NEGATIVE REPORT Performed at [facility]    Report Status PENDING  Incomplete  Culture, blood (routine x 2)     Status: None (Preliminary result)   Collection Time: 02/02/15 11:23 AM  Result Value Ref Range Status   Specimen Description BLOOD RIGHT HAND  Final   Special Requests BOTTLES DRAWN AEROBIC AND ANAEROBIC 5CC  Final   Culture   Final           BLOOD CULTURE RECEIVED NO GROWTH TO DATE CULTURE WILL BE HELD FOR 5 DAYS BEFORE ISSUING A FINAL NEGATIVE REPORT Performed at [facility]    Report Status PENDING  Incomplete    Studies/Results: No results found.  Postoperative day 5-left ureteral stent for left ureteral stone and pyelonephritis caused by Escherichia coli. Transitioning to by mouth abx. I'll set her up for the OR over the coming weeks for definitive stone management with ureteroscopy.   LOS: 5 days   Denise Avila 02/07/2015, 9:09 AM

## 2015-02-08 LAB — BASIC METABOLIC PANEL
Anion gap: 7 (ref 5–15)
BUN: 10 mg/dL (ref 6–20)
CALCIUM: 8.3 mg/dL — AB (ref 8.9–10.3)
CHLORIDE: 108 mmol/L (ref 101–111)
CO2: 23 mmol/L (ref 22–32)
Creatinine, Ser: 0.58 mg/dL (ref 0.44–1.00)
GFR calc Af Amer: >60 mL/min (ref >60)
GFR calc non Af Amer: >60 mL/min (ref >60)
Glucose, Bld: 97 mg/dL (ref 70–99)
Potassium: 4.1 mmol/L (ref 3.5–5.1)
Sodium: 138 mmol/L (ref 135–145)

## 2015-02-08 LAB — CULTURE, BLOOD (ROUTINE X 2)
CULTURE: NO GROWTH
Culture: NO GROWTH

## 2015-02-08 LAB — CBC
HCT: 34.7 % — ABNORMAL LOW (ref 36.0–46.0)
Hemoglobin: 11.7 g/dL — ABNORMAL LOW (ref 12.0–15.0)
MCH: 29.5 pg (ref 26.0–34.0)
MCHC: 33.7 g/dL (ref 30.0–36.0)
MCV: 87.4 fL (ref 78.0–100.0)
Platelets: 228 10*3/uL (ref 150–400)
RBC: 3.97 MIL/uL (ref 3.87–5.11)
RDW: 13 % (ref 11.5–15.5)
WBC: 5 10*3/uL (ref 4.0–10.5)

## 2015-02-08 MED ORDER — CIPROFLOXACIN HCL 500 MG PO TABS
500.0000 mg | ORAL_TABLET | Freq: Two times a day (BID) | ORAL | Status: DC
  Administered 2015-02-08 – 2015-02-09 (×2): 500 mg via ORAL
  Filled 2015-02-08 (×4): qty 1

## 2015-02-08 NOTE — Progress Notes (Signed)
[facility] for Rocephin Indication: UTI, pyelonephritis  Allergies  Allergen Reactions  . Penicillins Rash    Patient Measurements: Height: 6' (182.9 cm) Weight: 210 lb (95.255 kg) IBW/kg (Calculated) : 73.1   Vital Signs: Temp: 98 F (36.7 C) (05/08 0501) Temp Source: Oral (05/08 0501) BP: 109/52 mmHg (05/08 0501) Pulse Rate: 50 (05/08 0501) Intake/Output from previous day: 05/07 0701 - 05/08 0700 In: 1667.5 [P.O.:720; I.V.:897.5; IV Piggyback:50] Out: -  Intake/Output from this shift:    Labs:  Recent Labs  02/06/15 0455 02/07/15 0423 02/08/15 0508  WBC 5.4 4.5 5.0  HGB 10.0* 10.6* 11.7*  PLT 146* 166 228  CREATININE  --  0.50 0.58   Estimated Creatinine Clearance: 123.4 mL/min (by C-G formula based on Cr of 0.58).    Microbiology: Recent Results (from the past 720 hour(s))  Urine culture     Status: None   Collection Time: 02/01/15  8:04 PM  Result Value Ref Range Status   Specimen Description URINE, RANDOM  Final   Special Requests ADDED 2307  Final   Colony Count   Final    >=100,000 COLONIES/ML Performed at [facility]    Culture   Final    Multiple bacterial morphotypes present, none predominant. Suggest appropriate recollection if clinically indicated. Performed at [facility]    Report Status 02/03/2015 FINAL  Final  Urine culture     Status: None   Collection Time: 02/02/15  3:31 AM  Result Value Ref Range Status   Specimen Description URINE, RANDOM CYSTOSCOPE  Final   Special Requests NONE  Final   Colony Count   Final    >=100,000 COLONIES/ML Performed at [facility]    Culture   Final    ESCHERICHIA COLI Performed at [facility]    Report Status 02/04/2015 FINAL  Final   Organism ID, Bacteria ESCHERICHIA COLI  Final      Susceptibility   Escherichia coli - MIC*    AMPICILLIN <=2 SENSITIVE Sensitive     CEFAZOLIN <=4 SENSITIVE Sensitive     CEFTRIAXONE <=1  SENSITIVE Sensitive     CIPROFLOXACIN <=0.25 SENSITIVE Sensitive     GENTAMICIN <=1 SENSITIVE Sensitive     LEVOFLOXACIN <=0.12 SENSITIVE Sensitive     NITROFURANTOIN <=16 SENSITIVE Sensitive     TOBRAMYCIN <=1 SENSITIVE Sensitive     TRIMETH/SULFA >=320 RESISTANT Resistant     PIP/TAZO <=4 SENSITIVE Sensitive     * ESCHERICHIA COLI  Culture, blood (routine x 2)     Status: None   Collection Time: 02/02/15 11:20 AM  Result Value Ref Range Status   Specimen Description BLOOD RIGHT ARM  Final   Special Requests BOTTLES DRAWN AEROBIC AND ANAEROBIC 10CC  Final   Culture   Final    NO GROWTH 5 DAYS Performed at [facility]    Report Status 02/08/2015 FINAL  Final  Culture, blood (routine x 2)     Status: None   Collection Time: 02/02/15 11:23 AM  Result Value Ref Range Status   Specimen Description BLOOD RIGHT HAND  Final   Special Requests BOTTLES DRAWN AEROBIC AND ANAEROBIC 5CC  Final   Culture   Final    NO GROWTH 5 DAYS Performed at [facility]    Report Status 02/08/2015 FINAL  Final    Medical History: Past Medical History  Diagnosis Date  . Endometriosis     Medications:  Prescriptions prior to  admission  Medication Sig Dispense Refill Last Dose  . citalopram (CELEXA) 20 MG tablet Take 20 mg by mouth at bedtime.   01/31/2015 at Unknown time  . traZODone (DESYREL) 50 MG tablet Take 50 mg by mouth at bedtime.   01/31/2015 at unknown   Scheduled:  . cefTRIAXone (ROCEPHIN)  IV  1 g Intravenous Q24H  . citalopram  20 mg Oral Q24H  . polyethylene glycol  17 g Oral Daily  . senna-docusate  1 tablet Oral BID  . tamsulosin  0.4 mg Oral QPC supper   Infusions:  . sodium chloride 50 mL/hr at 02/08/15 0303   Assessment: 65 yoF presented with lower quandrant abdominal pain x2 weeks and new n/v, flank pain.  CT showed hydronephrosis c/w obstruction of left kidney; 31mm distal stone.  Rocephin per Rx for UTI/pyelonephritis.    Goal of Therapy:  Appropriate  abx dosing for indication, eradication of infection.  Today, 02/08/2015: D#7 Rocephin 1g IV q24h Tmax: 57F Leukocytosis resolved UCx growing E.Coli (see susceptibilities above)   Plan:   Continue Rocephin 1g IV q24h  Await word on planned duration of therapy.  Anticipate de-escalation to PO once oral intake improves.   Lindell Spar, PharmD, BCPS Pager: [phone] 02/08/2015 9:47 AM

## 2015-02-08 NOTE — Progress Notes (Signed)
Patient ID: Denise Avila, female   DOB: 06/05/1976, 39 y.o.   MRN: [id number]  [facility] PROGRESS NOTE  Denise Avila [id number]:[id number] DOB: 10/12/1975 DOA: 02/01/2015 PCP: No primary care provider on file.   Brief narrative:    39 y/o female with PMH of endometriosis presented with 2 weeks duration of progressive and sharp, 10/10 in severity L flank pain, associated with nausea, vomiting and no specific alleviating factors, radiating to the entire ant abd area. Admitted with pyelonephritis/sepsis, L sided nephrolithiasis, hydronephrosis.   Assessment/Plan:    Active Problems:   Sepsis secondary to left nephrolithiasis, pyelonephritis - criteria for sepsis met on admission with RR as high as 26 bpm, T 99.76F, WBC 14 K, source E. Coli pyelonephritis  - started on rocephin, today is day #6/14, will transition to oral Cipro today and see how pt tolerates  - appreciate urology team following, pt advised to follow up with Dr. Junious Silk  - he will set her up for the OR over the coming weeks for definitive stone management with ureteroscopy. - continue to provide analgesia as needed   Nephrolithiasis, left sided ureteral stone and with hydronephrosis  - s/p left cystoscopy 5/2 with left retrograde pyelogram, left ureteral stent placement  - transition to oral ABX Cipro 5/8 and possible d/c in AMif pt tolerating well    History of endometriosis - L sided ruptured adnexal cyst.  - Patient is schedulinghysterectomy as outpatient   Thrombocytopenia in the setting of sepsis - no signs of active bleeding, resolved    Constipation - abd exam: unremarkable. Good BS. - has small BM yesterday    Migraines likely worse in the setting of sepsis/fever - continue Fioricet, toradol prn    Hypokalemia - supplemented and WNL this AM - repeat BMP in AM  DVT prophylaxis - SCD's  Code Status: Full.  Family Communication:  plan of care discussed with the patient Disposition Plan: Barrier to  discharge --> pt reports poor oral intake, will transition to oral ABX today and if she is tolerating well, will plan on d/c in AM home   IV access:  Peripheral IV  Procedures and diagnostic studies:    Ct Abdomen Pelvis W Contrast  02/02/2015   Small to moderate amount of free fluid in the pelvis with suspected collapsing, ruptured LEFT adnexal 3.2 cm cyst. This would be better characterized on pelvic sonogram.  LEFT pyelonephritis, mild hydronephrosis into the level of the distal ureter where a 6 mm calcification is seen, likely reflecting obstructing urolithiasis, less likely phlebolith.     Medical Consultants:  Urology   Other Consultants:  None   IAnti-Infectives:   Rocephin 5/3 --> May 16th, 2016   Faye Ramsay, MD  [facility] Pager [phone]  If 7PM-7AM, please contact night-coverage [email or web address] Password [email or web address] 02/08/2015, 2:05 PM   LOS: 6 days   HPI/Subjective: No events overnight. Still with nausea and poor oral intake.   Objective: Filed Vitals:   02/07/15 0438 02/07/15 1339 02/07/15 2025 02/08/15 0501  BP: 125/70 119/55 107/62 109/52  Pulse: 55 50 53 50  Temp: 99.4 F (37.4 C) 99 F (37.2 C) 98 F (36.7 C) 98 F (36.7 C)  TempSrc: Oral Oral Axillary Oral  Resp: _0 Height:      Weight:      SpO2: 97% 97% 98% 95%    Intake/Output Summary (Last 24 hours) at 02/08/15 1405 Last data filed at 02/08/15 0550  Gross per 24 hour  Intake 845.01 ml  Output      0 ml  Net 845.01 ml    Exam:   General:  Pt is alert, follows commands appropriately, not in acute distress  Cardiovascular: Regular rhythm, bradycardic, no rubs, no gallops  Respiratory: Clear to auscultation bilaterally, no wheezing, no crackles, no rhonchi  Abdomen: Soft, non tender and non distended, bowel sounds present, no guarding  Extremities: No edema, pulses DP and PT palpable bilaterally   Data Reviewed: Basic Metabolic Panel:  Recent Labs Lab 02/01/15 1739  02/02/15 0745 02/03/15 0450 02/07/15 0423 02/08/15 0508  NA 138 135 139 141 138  K 3.7 3.5 3.4* 3.4* 4.1  CL 109 110 109 109 108  CO2 19* 21* _0 GLUCOSE 139* 135* 117* 100* 97  BUN 20 22* 19 5* 10  CREATININE 1.07* 0.83 0.98 0.50 0.58  CALCIUM 9.1 8.1* 8.2* 8.2* 8.3*   Liver Function Tests:  Recent Labs Lab 02/01/15 1739 02/03/15 0450  AST 20 22  ALT 20 22  ALKPHOS 62 59  BILITOT 1.0 0.8  PROT 7.1 6.0*  ALBUMIN 4.0 3.2*    Recent Labs Lab 02/01/15 1739  LIPASE 26   CBC:  Recent Labs Lab 02/01/15 1739 02/02/15 0745 02/03/15 0450 02/06/15 0455 02/07/15 0423 02/08/15 0508  WBC 14.3* 20.4* 8.3 5.4 4.5 5.0  NEUTROABS 13.0*  --   --   --   --   --   HGB 13.5 11.8* 11.8* 10.0* 10.6* 11.7*  HCT 38.8 35.3* 35.3* 30.3* 32.0* 34.7*  MCV 85.8 89.1 90.3 87.8 87.0 87.4  PLT 165 145* 107* 146* 166 228     Recent Results (from the past 240 hour(s))  Urine culture     Status: None   Collection Time: 02/01/15  8:04 PM  Result Value Ref Range Status   Specimen Description URINE, RANDOM  Final   Special Requests ADDED 2307  Final   Colony Count   Final    >=100,000 COLONIES/ML Performed at [facility]    Culture   Final    Multiple bacterial morphotypes present, none predominant. Suggest appropriate recollection if clinically indicated. Performed at [facility]    Report Status 02/03/2015 FINAL  Final  Urine culture     Status: None   Collection Time: 02/02/15  3:31 AM  Result Value Ref Range Status   Specimen Description URINE, RANDOM CYSTOSCOPE  Final   Special Requests NONE  Final   Colony Count   Final    >=100,000 COLONIES/ML Performed at [place]   Final    ESCHERICHIA COLI Performed at [facility]    Report Status 02/04/2015 FINAL  Final   Organism ID, Bacteria ESCHERICHIA COLI  Final      Susceptibility   Escherichia coli - MIC*    AMPICILLIN <=2 SENSITIVE Sensitive     CEFAZOLIN <=4  SENSITIVE Sensitive     CEFTRIAXONE <=1 SENSITIVE Sensitive     CIPROFLOXACIN <=0.25 SENSITIVE Sensitive     GENTAMICIN <=1 SENSITIVE Sensitive     LEVOFLOXACIN <=0.12 SENSITIVE Sensitive     NITROFURANTOIN <=16 SENSITIVE Sensitive     TOBRAMYCIN <=1 SENSITIVE Sensitive     TRIMETH/SULFA >=320 RESISTANT Resistant     PIP/TAZO <=4 SENSITIVE Sensitive     * ESCHERICHIA COLI  Culture, blood (routine x 2)     Status: None   Collection Time: 02/02/15 11:20 AM  Result Value Ref Range Status   Specimen Description BLOOD  RIGHT ARM  Final   Special Requests BOTTLES DRAWN AEROBIC AND ANAEROBIC 10CC  Final   Culture   Final    NO GROWTH 5 DAYS Performed at [facility]    Report Status 02/08/2015 FINAL  Final  Culture, blood (routine x 2)     Status: None   Collection Time: 02/02/15 11:23 AM  Result Value Ref Range Status   Specimen Description BLOOD RIGHT HAND  Final   Special Requests BOTTLES DRAWN AEROBIC AND ANAEROBIC 5CC  Final   Culture   Final    NO GROWTH 5 DAYS Performed at [facility]    Report Status 02/08/2015 FINAL  Final     Scheduled Meds: . ciprofloxacin  500 mg Oral BID  . citalopram  20 mg Oral Q24H  . polyethylene glycol  17 g Oral Daily  . senna-docusate  1 tablet Oral BID  . tamsulosin  0.4 mg Oral QPC supper   Continuous Infusions:

## 2015-02-09 DIAGNOSIS — N1339 Other hydronephrosis: Secondary | ICD-10-CM

## 2015-02-09 LAB — BASIC METABOLIC PANEL
Anion gap: 11 (ref 5–15)
BUN: 11 mg/dL (ref 6–20)
CO2: 21 mmol/L — ABNORMAL LOW (ref 22–32)
CREATININE: 0.65 mg/dL (ref 0.44–1.00)
Calcium: 8.5 mg/dL — ABNORMAL LOW (ref 8.9–10.3)
Chloride: 106 mmol/L (ref 101–111)
GFR calc Af Amer: >60 mL/min (ref >60)
GFR calc non Af Amer: >60 mL/min (ref >60)
GLUCOSE: 103 mg/dL — AB (ref 70–99)
Potassium: 4.1 mmol/L (ref 3.5–5.1)
Sodium: 138 mmol/L (ref 135–145)

## 2015-02-09 MED ORDER — PHENAZOPYRIDINE HCL 100 MG PO TABS
100.0000 mg | ORAL_TABLET | Freq: Three times a day (TID) | ORAL | Status: DC
  Administered 2015-02-09 – 2015-02-10 (×2): 100 mg via ORAL
  Filled 2015-02-09 (×3): qty 1

## 2015-02-09 MED ORDER — LEVOFLOXACIN 500 MG PO TABS
500.0000 mg | ORAL_TABLET | Freq: Every day | ORAL | Status: DC
  Administered 2015-02-09: 500 mg via ORAL
  Filled 2015-02-09 (×2): qty 1

## 2015-02-09 NOTE — Progress Notes (Signed)
Patient ID: Denise Avila, female   DOB: 07-08-1976, 39 y.o.   MRN: [id number]  [facility] PROGRESS NOTE  Denise Avila [id number] DOB: June 14, 1976 DOA: 02/01/2015 PCP: No primary care provider on file.   Brief narrative:    39 y/o female with PMH of endometriosis presented with 2 weeks duration of progressive and sharp, 10/10 in severity L flank pain, associated with nausea, vomiting and no specific alleviating factors, radiating to the entire ant abd area. Admitted with pyelonephritis/sepsis, L sided nephrolithiasis, hydronephrosis.   Assessment/Plan:    Active Problems:   Sepsis secondary to left nephrolithiasis, pyelonephritis - criteria for sepsis met on admission with RR as high as 26 bpm, T 99.72F, WBC 14 K, source E. Coli pyelonephritis  - started on rocephin, today is for 6 days - We'll need to follow-up with urologist after discharge for further evaluation - Reported diarrhea on Cipro as such will change to Levaquin    Nephrolithiasis, left sided ureteral stone and with hydronephrosis  - s/p left cystoscopy 5/2 with left retrograde pyelogram, left ureteral stent placement  - transition to oral ABX Cipro 5/8 and possible d/c in AMif pt tolerating well     History of endometriosis - L sided ruptured adnexal cyst.  - Patient is schedulinghysterectomy as outpatient    Thrombocytopenia in the setting of sepsis - no signs of active bleeding, resolved     Constipation - resolved and reporting loose stools with addition of Cipro    Migraines likely worse in the setting of sepsis/fever - continue Fioricet, toradol prn     Hypokalemia - Within normal limits on last check  DVT prophylaxis - SCD's  Code Status: Full.  Family Communication:  plan of care discussed with the patient Disposition Plan: If tolerating oral antibiotics will consider discharge next a.m.  IV access:  Peripheral IV  Procedures and diagnostic studies:    Ct Abdomen Pelvis W  Contrast  02/02/2015   Small to moderate amount of free fluid in the pelvis with suspected collapsing, ruptured LEFT adnexal 3.2 cm cyst. This would be better characterized on pelvic sonogram.  LEFT pyelonephritis, mild hydronephrosis into the level of the distal ureter where a 6 mm calcification is seen, likely reflecting obstructing urolithiasis, less likely phlebolith.     Medical Consultants:  Urology   Other Consultants:  None   IAnti-Infectives:   Rocephin 5/3 --> May 16th, 2016   Velvet Bathe, MD  [facility] Pager [phone]  If 7PM-7AM, please contact night-coverage [email or web address] Password [email or web address] 02/09/2015, 6:30 PM   LOS: 7 days   HPI/Subjective: Patient reports several bouts of diarrhea starting at midnight last night. Denies any bloody diarrhea.  Objective: Filed Vitals:   02/08/15 1433 02/08/15 2221 02/09/15 0456 02/09/15 1348  BP: 110/64 111/69 102/56 100/56  Pulse: 50 47 56 65  Temp: 98.9 F (37.2 C) 99 F (37.2 C) 98.4 F (36.9 C) 98.3 F (36.8 C)  TempSrc: Oral Oral Oral Oral  Resp: 16 16 16 16   Height:      Weight:      SpO2: 97% 98% 98%     Intake/Output Summary (Last 24 hours) at 02/09/15 1830 Last data filed at 02/09/15 1300  Gross per 24 hour  Intake 1387.5 ml  Output      0 ml  Net 1387.5 ml    Exam:   General:  Pt is alert, follows commands appropriately, not in acute distress  Cardiovascular: Regular rhythm, bradycardic, no rubs, no gallops  Respiratory: Clear to  auscultation bilaterally, no wheezing, no crackles, no rhonchi  Abdomen: Soft, non tender and non distended, bowel sounds present, no guarding  Extremities: No edema, pulses DP and PT palpable bilaterally   Data Reviewed: Basic Metabolic Panel:  Recent Labs Lab 02/03/15 0450 02/07/15 0423 02/08/15 0508 02/09/15 0451  NA 139 141 138 138  K 3.4* 3.4* 4.1 4.1  CL 109 109 108 106  CO2 23 24 23  21*  GLUCOSE 117* 100* 97 103*  BUN 19 5* 10 11  CREATININE 0.98 0.50 0.58 0.65   CALCIUM 8.2* 8.2* 8.3* 8.5*   Liver Function Tests:  Recent Labs Lab 02/03/15 0450  AST 22  ALT 22  ALKPHOS 59  BILITOT 0.8  PROT 6.0*  ALBUMIN 3.2*   No results for input(s): LIPASE, AMYLASE in the last 168 hours. CBC:  Recent Labs Lab 02/03/15 0450 02/06/15 0455 02/07/15 0423 02/08/15 0508  WBC 8.3 5.4 4.5 5.0  HGB 11.8* 10.0* 10.6* 11.7*  HCT 35.3* 30.3* 32.0* 34.7*  MCV 90.3 87.8 87.0 87.4  PLT 107* 146* 166 228     Recent Results (from the past 240 hour(s))  Urine culture     Status: None   Collection Time: 02/01/15  8:04 PM  Result Value Ref Range Status   Specimen Description URINE, RANDOM  Final   Special Requests ADDED 2307  Final   Colony Count   Final    >=100,000 COLONIES/ML Performed at [facility]    Culture   Final    Multiple bacterial morphotypes present, none predominant. Suggest appropriate recollection if clinically indicated. Performed at [facility]    Report Status 02/03/2015 FINAL  Final  Urine culture     Status: None   Collection Time: 02/02/15  3:31 AM  Result Value Ref Range Status   Specimen Description URINE, RANDOM CYSTOSCOPE  Final   Special Requests NONE  Final   Colony Count   Final    >=100,000 COLONIES/ML Performed at [facility]    Culture   Final    ESCHERICHIA COLI Performed at [facility]    Report Status 02/04/2015 FINAL  Final   Organism ID, Bacteria ESCHERICHIA COLI  Final      Susceptibility   Escherichia coli - MIC*    AMPICILLIN <=2 SENSITIVE Sensitive     CEFAZOLIN <=4 SENSITIVE Sensitive     CEFTRIAXONE <=1 SENSITIVE Sensitive     CIPROFLOXACIN <=0.25 SENSITIVE Sensitive     GENTAMICIN <=1 SENSITIVE Sensitive     LEVOFLOXACIN <=0.12 SENSITIVE Sensitive     NITROFURANTOIN <=16 SENSITIVE Sensitive     TOBRAMYCIN <=1 SENSITIVE Sensitive     TRIMETH/SULFA >=320 RESISTANT Resistant     PIP/TAZO <=4 SENSITIVE Sensitive     * ESCHERICHIA COLI  Culture, blood  (routine x 2)     Status: None   Collection Time: 02/02/15 11:20 AM  Result Value Ref Range Status   Specimen Description BLOOD RIGHT ARM  Final   Special Requests BOTTLES DRAWN AEROBIC AND ANAEROBIC 10CC  Final   Culture   Final    NO GROWTH 5 DAYS Performed at [facility]    Report Status 02/08/2015 FINAL  Final  Culture, blood (routine x 2)     Status: None   Collection Time: 02/02/15 11:23 AM  Result Value Ref Range Status   Specimen Description BLOOD RIGHT HAND  Final   Special Requests BOTTLES DRAWN AEROBIC AND ANAEROBIC 5CC  Final   Culture  Final    NO GROWTH 5 DAYS Performed at [facility]    Report Status 02/08/2015 FINAL  Final     Scheduled Meds: . citalopram  20 mg Oral Q24H  . levofloxacin  500 mg Oral QHS  . phenazopyridine  100 mg Oral TID WC  . polyethylene glycol  17 g Oral Daily  . senna-docusate  1 tablet Oral BID  . tamsulosin  0.4 mg Oral QPC supper   Continuous Infusions:

## 2015-02-10 ENCOUNTER — Other Ambulatory Visit: Payer: Self-pay | Admitting: Urology

## 2015-02-10 MED ORDER — TAMSULOSIN HCL 0.4 MG PO CAPS: 0.4000 mg | ORAL_CAPSULE | Freq: Every day | ORAL | Status: DC

## 2015-02-10 MED ORDER — PHENAZOPYRIDINE HCL 100 MG PO TABS: 100.0000 mg | ORAL_TABLET | Freq: Three times a day (TID) | ORAL | Status: DC

## 2015-02-10 MED ORDER — HYDROCODONE-ACETAMINOPHEN 5-325 MG PO TABS: 1.0000 | ORAL_TABLET | ORAL | Status: DC | PRN

## 2015-02-10 MED ORDER — LEVOFLOXACIN 500 MG PO TABS: 500.0000 mg | ORAL_TABLET | Freq: Every day | ORAL | Status: DC

## 2015-02-10 NOTE — Discharge Summary (Signed)
Physician Discharge Summary  Denise Avila [id number]:[id number] DOB: [birth year] DOA: 02/01/2015  PCP: No primary care provider on file.  Admit date: 02/01/2015 Discharge date: 02/10/2015  Time spent: More than 35 minutes   Recommendations for Outpatient Follow-up:  1. Patient will need follow-up with urologist after discharge. She is to call their office to follow-up with them based on neurologist recommendation  Discharge Diagnoses:  Active Problems:   Pyelonephritis   Nephrolithiasis   Hydronephrosis   Discharge Condition: Stable  Diet recommendation: Regular diet  Filed Weights   02/02/15 0455  Weight: 95.255 kg (210 lb)    History of present illness:  39 y/o female with PMH of endometriosis presented with 2 weeks duration of progressive and sharp, 10/10 in severity L flank pain, associated with nausea, vomiting and no specific alleviating factors, radiating to the entire ant abd area. Admitted with pyelonephritis/sepsis, L sided nephrolithiasis, hydronephrosis.  Hospital Course:  Sepsis secondary to left nephrolithiasis, pyelonephritis - criteria for sepsis met on admission with RR as high as 26 bpm, T 99.59F, WBC 14 K, source E. Coli pyelonephritis  - We'll need to follow-up with urologist after discharge for further evaluation - Reported diarrhea on Cipro as such will change to Levaquin   Nephrolithiasis, left sided ureteral stone and with hydronephrosis  - s/p left cystoscopy 5/2 with left retrograde pyelogram, left ureteral stent placement  - transition to oral ABX Levaquin patient to continue for 7 more days for treatment of 14 days total of antibiotics   History of endometriosis - L sided ruptured adnexal cyst.  - Patient is schedulinghysterectomy as outpatient   Thrombocytopenia in the setting of sepsis - no signs of active bleeding, resolved    Constipation -Resolved   Migraines likely worse in the setting of sepsis/fever - continue Fioricet, toradol  prn   Hypokalemia - Within normal limits on last check   Procedures:  Please see above and refer to EMR  Consultations:  Urologist: Dr. Junious Silk  Discharge Exam: Filed Vitals:   02/10/15 0536  BP: 106/56  Pulse: 67  Temp: 98.9 F (37.2 C)  Resp: 16    General: Pt in nad, alert and awake Cardiovascular: no cyanosis Respiratory: No increased work of breathing, no audible wheezes, chest rise  Discharge Instructions   Discharge Instructions    Call MD for:  difficulty breathing, headache or visual disturbances    Complete by:  As directed      Call MD for:  temperature >100.4    Complete by:  As directed      Diet - low sodium heart healthy    Complete by:  As directed      Increase activity slowly    Complete by:  As directed           Discharge Medication List as of 02/10/2015 10:51 AM    START taking these medications   Details  HYDROcodone-acetaminophen (NORCO/VICODIN) 5-325 MG per tablet Take 1-2 tablets by mouth every 4 (four) hours as needed for moderate pain or severe pain., Starting 02/10/2015, Until Discontinued, Print    levofloxacin (LEVAQUIN) 500 MG tablet Take 1 tablet (500 mg total) by mouth at bedtime., Starting 02/10/2015, Until Discontinued, Print    phenazopyridine (PYRIDIUM) 100 MG tablet Take 1 tablet (100 mg total) by mouth 3 (three) times daily with meals., Starting 02/10/2015, Until Discontinued, Normal    tamsulosin (FLOMAX) 0.4 MG CAPS capsule Take 1 capsule (0.4 mg total) by mouth daily after supper., Starting 02/10/2015, Until Discontinued, Print  CONTINUE these medications which have NOT CHANGED   Details  citalopram (CELEXA) 20 MG tablet Take 20 mg by mouth at bedtime., Until Discontinued, Historical Med    traZODone (DESYREL) 50 MG tablet Take 50 mg by mouth at bedtime., Until Discontinued, Historical Med       Allergies  Allergen Reactions  . Penicillins Rash   Follow-up Information    Follow up with [facility],  MATTHEW, MD.   Specialty:  Urology   Why:  Office will call with appointment   Contact information:   [place] [id number] [phone]        The results of significant diagnostics from this hospitalization (including imaging, microbiology, ancillary and laboratory) are listed below for reference.    Significant Diagnostic Studies: Ct Abdomen Pelvis W Contrast  02/02/2015   CLINICAL DATA:  Endometriosis, pelvic pain. Hysterectomy scheduled for tomorrow.  EXAM: CT ABDOMEN AND PELVIS WITH CONTRAST  TECHNIQUE: Multidetector CT imaging of the abdomen and pelvis was performed using the standard protocol following bolus administration of intravenous contrast.  CONTRAST:  19m OMNIPAQUE IOHEXOL 300 MG/ML  SOLN  COMPARISON:  None.  FINDINGS: LUNG BASES: Included view of the lung bases are clear. Visualized heart and pericardium are unremarkable.  SOLID ORGANS: The liver, gallbladder, pancreas and adrenal glands are unremarkable. 12 mm hypodensity in the spleen may reflect a cyst or lymphangioma, spleen is otherwise unremarkable.  GASTROINTESTINAL TRACT: Tiny hiatal hernia. The stomach, small and large bowel are normal in course and caliber without inflammatory changes. Enteric contrast has not yet reached the distal small bowel. Normal appendix.  KIDNEYS/ URINARY TRACT: Kidneys are orthotopic. Two delayed nephrogram on the LEFT, with mild hydroureteronephrosis and urothelial enhancement to the level of the pelvis. 6 mm calcification in the LEFT pelvis may reflect urolithiasis. Too small to characterize hypodensities in RIGHT kidney. No RIGHT obstructive uropathy. Multiple small calcifications the LEFT pelvis.  PERITONEUM/RETROPERITONEUM: Aortoiliac vessels are normal in course and caliber. No lymphadenopathy by CT size criteria. Peripherally enhancing 2.3 x 3.2 cm LEFT adnexal low low-density lesion. Small to moderate round of low-density free fluid in the pelvis without intraperitoneal free  air.  SOFT TISSUE/OSSEOUS STRUCTURES: Non-suspicious. Genital piercing. Small fat containing umbilical hernia.  IMPRESSION: Small to moderate amount of free fluid in the pelvis with suspected collapsing, ruptured LEFT adnexal 3.2 cm cyst. This would be better characterized on pelvic sonogram.  LEFT pyelonephritis, mild hydronephrosis into the level of the distal ureter where a 6 mm calcification is seen, likely reflecting obstructing urolithiasis, less likely phlebolith.   Electronically Signed   By: CElon Alas  On: 02/02/2015 00:46    Microbiology: Recent Results (from the past 240 hour(s))  Urine culture     Status: None   Collection Time: 02/01/15  8:04 PM  Result Value Ref Range Status   Specimen Description URINE, RANDOM  Final   Special Requests ADDED 2307  Final   Colony Count   Final    >=100,000 COLONIES/ML [facility]   Culture   Final    Multiple bacterial morphotypes present, none predominant. Suggest appropriate recollection if clinically indicated. [facility]formed at [facility]   Report Status 02/03/2015 FINAL  Final  Urine culture     Status: None   Collection Time: 02/02/15  3:31 AM  Result Value Ref Range Status   Specimen Description URINE, RANDOM CYSTOSCOPE  Final   Special Requests NONE  Final   Colony Count  Final    >=100,000 COLONIES/ML Performed at [facility]    Culture   Final    ESCHERICHIA COLI Performed at [facility]    Report Status 02/04/2015 FINAL  Final   Organism ID, Bacteria ESCHERICHIA COLI  Final      Susceptibility   Escherichia coli - MIC*    AMPICILLIN <=2 SENSITIVE Sensitive     CEFAZOLIN <=4 SENSITIVE Sensitive     CEFTRIAXONE <=1 SENSITIVE Sensitive     CIPROFLOXACIN <=0.25 SENSITIVE Sensitive     GENTAMICIN <=1 SENSITIVE Sensitive     LEVOFLOXACIN <=0.12 SENSITIVE Sensitive     NITROFURANTOIN <=16 SENSITIVE Sensitive     TOBRAMYCIN <=1 SENSITIVE Sensitive     TRIMETH/SULFA  >=320 RESISTANT Resistant     PIP/TAZO <=4 SENSITIVE Sensitive     * ESCHERICHIA COLI  Culture, blood (routine x 2)     Status: None   Collection Time: 02/02/15 11:20 AM  Result Value Ref Range Status   Specimen Description BLOOD RIGHT ARM  Final   Special Requests BOTTLES DRAWN AEROBIC AND ANAEROBIC 10CC  Final   Culture   Final    NO GROWTH 5 DAYS Performed at [facility]    Report Status 02/08/2015 FINAL  Final  Culture, blood (routine x 2)     Status: None   Collection Time: 02/02/15 11:23 AM  Result Value Ref Range Status   Specimen Description BLOOD RIGHT HAND  Final   Special Requests BOTTLES DRAWN AEROBIC AND ANAEROBIC 5CC  Final   Culture   Final    NO GROWTH 5 DAYS Performed at [facility]    Report Status 02/08/2015 FINAL  Final     Labs: Basic Metabolic Panel:  Recent Labs Lab 02/07/15 0423 02/08/15 0508 02/09/15 0451  NA 141 138 138  K 3.4* 4.1 4.1  CL 109 108 106  CO2 24 23 21*  GLUCOSE 100* 97 103*  BUN 5* 10 11  CREATININE 0.50 0.58 0.65  CALCIUM 8.2* 8.3* 8.5*   Liver Function Tests: No results for input(s): AST, ALT, ALKPHOS, BILITOT, PROT, ALBUMIN in the last 168 hours. No results for input(s): LIPASE, AMYLASE in the last 168 hours. No results for input(s): AMMONIA in the last 168 hours. CBC:  Recent Labs Lab 02/06/15 0455 02/07/15 0423 02/08/15 0508  WBC 5.4 4.5 5.0  HGB 10.0* 10.6* 11.7*  HCT 30.3* 32.0* 34.7*  MCV 87.8 87.0 87.4  PLT 146* 166 228   Cardiac Enzymes: No results for input(s): CKTOTAL, CKMB, CKMBINDEX, TROPONINI in the last 168 hours. BNP: BNP (last 3 results) No results for input(s): BNP in the last 8760 hours.  ProBNP (last 3 results) No results for input(s): PROBNP in the last 8760 hours.  CBG: No results for input(s): GLUCAP in the last 168 hours.     Signed:  Velvet Bathe  [facility] 02/10/2015, 1:27 PM

## 2015-02-10 NOTE — Progress Notes (Signed)
RN reviewed discharge instructions with patient. All questions answered.   Paperwork and prescriptions given.   NT rolled patient down in wheelchair with all belongings to family car.

## 2015-02-11 ENCOUNTER — Encounter (HOSPITAL_COMMUNITY): Payer: Self-pay | Admitting: *Deleted

## 2015-02-11 ENCOUNTER — Other Ambulatory Visit (HOSPITAL_COMMUNITY): Payer: Self-pay | Admitting: Anesthesiology

## 2015-02-11 NOTE — Progress Notes (Signed)
LM with Fraser Din at [facility] requesting a reminder to Dr Junious Silk that pre op orders need to be signed

## 2015-02-13 ENCOUNTER — Ambulatory Visit (HOSPITAL_COMMUNITY): Payer: BLUE CROSS/BLUE SHIELD | Admitting: Certified Registered Nurse Anesthetist

## 2015-02-13 ENCOUNTER — Encounter (HOSPITAL_COMMUNITY)
Admission: RE | Disposition: A | Discharge status: Home / Self Care | Payer: Self-pay | Source: Ambulatory Visit | Attending: Urology

## 2015-02-13 ENCOUNTER — Ambulatory Visit (HOSPITAL_COMMUNITY)
Admission: RE | Admit: 2015-02-13 | Discharge: 2015-02-13 | Disposition: A | Discharge status: Home / Self Care | Payer: BLUE CROSS/BLUE SHIELD | Source: Ambulatory Visit | Attending: Urology | Admitting: Urology

## 2015-02-13 ENCOUNTER — Encounter (HOSPITAL_COMMUNITY): Payer: Self-pay | Admitting: Certified Registered Nurse Anesthetist

## 2015-02-13 DIAGNOSIS — N201 Calculus of ureter: Secondary | ICD-10-CM

## 2015-02-13 DIAGNOSIS — Z881 Allergy status to other antibiotic agents status: Secondary | ICD-10-CM | POA: Insufficient documentation

## 2015-02-13 DIAGNOSIS — Z880 Allergy status to penicillin: Secondary | ICD-10-CM | POA: Diagnosis not present

## 2015-02-13 DIAGNOSIS — G43909 Migraine, unspecified, not intractable, without status migrainosus: Secondary | ICD-10-CM | POA: Insufficient documentation

## 2015-02-13 HISTORY — DX: Personal history of urinary calculi: Z87.442

## 2015-02-13 HISTORY — DX: Migraine, unspecified, not intractable, without status migrainosus: G43.909

## 2015-02-13 HISTORY — PX: CYSTOSCOPY WITH URETEROSCOPY AND STENT PLACEMENT: SHX6377

## 2015-02-13 HISTORY — PX: HOLMIUM LASER APPLICATION: SHX5852

## 2015-02-13 LAB — HCG, SERUM, QUALITATIVE: Preg, Serum: NEGATIVE

## 2015-02-13 SURGERY — CYSTOURETEROSCOPY, WITH STENT INSERTION
Anesthesia: General

## 2015-02-13 MED ORDER — LIDOCAINE HCL (CARDIAC) 20 MG/ML IV SOLN
INTRAVENOUS | Status: DC | PRN
  Administered 2015-02-13: 50 mg via INTRAVENOUS

## 2015-02-13 MED ORDER — MIDAZOLAM HCL 5 MG/5ML IJ SOLN
INTRAMUSCULAR | Status: DC | PRN
  Administered 2015-02-13: 2 mg via INTRAVENOUS

## 2015-02-13 MED ORDER — PROMETHAZINE HCL 25 MG/ML IJ SOLN
6.2500 mg | INTRAMUSCULAR | Status: DC | PRN
  Administered 2015-02-13: 6.25 mg via INTRAVENOUS
  Filled 2015-02-13: qty 1

## 2015-02-13 MED ORDER — FENTANYL CITRATE (PF) 100 MCG/2ML IJ SOLN
INTRAMUSCULAR | Status: DC | PRN
  Administered 2015-02-13 (×3): 25 ug via INTRAVENOUS
  Administered 2015-02-13: 50 ug via INTRAVENOUS

## 2015-02-13 MED ORDER — SODIUM CHLORIDE 0.9 % IR SOLN
Status: DC | PRN
  Administered 2015-02-13: 4000 mL

## 2015-02-13 MED ORDER — ONDANSETRON HCL 4 MG/2ML IJ SOLN
INTRAMUSCULAR | Status: DC | PRN
  Administered 2015-02-13: 4 mg via INTRAVENOUS

## 2015-02-13 MED ORDER — KETOROLAC TROMETHAMINE 30 MG/ML IJ SOLN
INTRAMUSCULAR | Status: AC
  Filled 2015-02-13: qty 1

## 2015-02-13 MED ORDER — CEFAZOLIN SODIUM-DEXTROSE 2-3 GM-% IV SOLR
INTRAVENOUS | Status: AC
  Filled 2015-02-13: qty 50

## 2015-02-13 MED ORDER — CEFAZOLIN SODIUM-DEXTROSE 2-3 GM-% IV SOLR
2.0000 g | Freq: Once | INTRAVENOUS | Status: AC
  Administered 2015-02-13: 2 g via INTRAVENOUS

## 2015-02-13 MED ORDER — LIDOCAINE HCL (CARDIAC) 20 MG/ML IV SOLN
INTRAVENOUS | Status: AC
  Filled 2015-02-13: qty 5

## 2015-02-13 MED ORDER — HYDROMORPHONE HCL 1 MG/ML IJ SOLN
0.2500 mg | INTRAMUSCULAR | Status: DC | PRN
  Administered 2015-02-13 (×4): 0.5 mg via INTRAVENOUS

## 2015-02-13 MED ORDER — OXYCODONE HCL 5 MG/5ML PO SOLN
5.0000 mg | Freq: Once | ORAL | Status: DC | PRN
  Filled 2015-02-13: qty 5

## 2015-02-13 MED ORDER — LACTATED RINGERS IV SOLN
INTRAVENOUS | Status: DC
  Administered 2015-02-13: 09:00:00 via INTRAVENOUS

## 2015-02-13 MED ORDER — HYDROMORPHONE HCL 1 MG/ML IJ SOLN
INTRAMUSCULAR | Status: AC
  Filled 2015-02-13: qty 1

## 2015-02-13 MED ORDER — ONDANSETRON HCL 4 MG/2ML IJ SOLN
INTRAMUSCULAR | Status: AC
  Filled 2015-02-13: qty 2

## 2015-02-13 MED ORDER — ACETAMINOPHEN 10 MG/ML IV SOLN
1000.0000 mg | Freq: Once | INTRAVENOUS | Status: AC
  Administered 2015-02-13: 1000 mg via INTRAVENOUS
  Filled 2015-02-13: qty 100

## 2015-02-13 MED ORDER — PROPOFOL 10 MG/ML IV BOLUS
INTRAVENOUS | Status: DC | PRN
  Administered 2015-02-13: 170 mg via INTRAVENOUS

## 2015-02-13 MED ORDER — KETOROLAC TROMETHAMINE 30 MG/ML IJ SOLN
30.0000 mg | Freq: Once | INTRAMUSCULAR | Status: AC | PRN
  Administered 2015-02-13: 30 mg via INTRAVENOUS

## 2015-02-13 MED ORDER — OXYCODONE HCL 5 MG PO TABS: 5.0000 mg | ORAL_TABLET | Freq: Once | ORAL | Status: DC | PRN

## 2015-02-13 MED ORDER — MIDAZOLAM HCL 2 MG/2ML IJ SOLN
INTRAMUSCULAR | Status: AC
  Filled 2015-02-13: qty 2

## 2015-02-13 MED ORDER — FENTANYL CITRATE (PF) 250 MCG/5ML IJ SOLN
INTRAMUSCULAR | Status: AC
  Filled 2015-02-13: qty 5

## 2015-02-13 MED ORDER — LACTATED RINGERS IV SOLN
INTRAVENOUS | Status: DC | PRN
  Administered 2015-02-13: 07:00:00 via INTRAVENOUS

## 2015-02-13 MED ORDER — PROPOFOL 10 MG/ML IV BOLUS
INTRAVENOUS | Status: AC
  Filled 2015-02-13: qty 20

## 2015-02-13 MED ORDER — MEPERIDINE HCL 50 MG/ML IJ SOLN: 6.2500 mg | INTRAMUSCULAR | Status: DC | PRN

## 2015-02-13 SURGICAL SUPPLY — 25 items
BAG URO CATCHER STRL LF (DRAPE) ×3 IMPLANT
BASKET STNLS GEMINI 4WIRE 3FR (BASKET) IMPLANT
BASKET ZERO TIP NITINOL 2.4FR (BASKET) ×3 IMPLANT
BSKT STON RTRVL GEM 120X11 3FR (BASKET)
BSKT STON RTRVL ZERO TP 2.4FR (BASKET) ×1
CATH INTERMIT  6FR 70CM (CATHETERS) IMPLANT
CATH URET 5FR 28IN CONE TIP (BALLOONS)
CATH URET 5FR 70CM CONE TIP (BALLOONS) IMPLANT
CATH URET DUAL LUMEN 6-10FR 50 (CATHETERS) IMPLANT
CLOTH BEACON ORANGE TIMEOUT ST (SAFETY) ×3 IMPLANT
EXTRACTOR STONE NITINOL NGAGE (UROLOGICAL SUPPLIES) IMPLANT
FIBER LASER FLEXIVA 1000 (UROLOGICAL SUPPLIES) IMPLANT
FIBER LASER FLEXIVA 200 (UROLOGICAL SUPPLIES) ×3 IMPLANT
FIBER LASER FLEXIVA 365 (UROLOGICAL SUPPLIES) IMPLANT
FIBER LASER FLEXIVA 550 (UROLOGICAL SUPPLIES) IMPLANT
FIBER LASER TRAC TIP (UROLOGICAL SUPPLIES) ×3 IMPLANT
GLOVE BIO SURGEON STRL SZ7.5 (GLOVE) ×3 IMPLANT
GOWN STRL REUS W/TWL XL LVL3 (GOWN DISPOSABLE) ×3 IMPLANT
GUIDEWIRE ANG ZIPWIRE 038X150 (WIRE) IMPLANT
GUIDEWIRE STR DUAL SENSOR (WIRE) ×3 IMPLANT
MANIFOLD NEPTUNE II (INSTRUMENTS) ×3 IMPLANT
PACK CYSTO (CUSTOM PROCEDURE TRAY) ×3 IMPLANT
STENT CONTOUR 6FRX26X.038 (STENTS) ×3 IMPLANT
TUBING CONNECTING 10 (TUBING) ×3 IMPLANT
WIRE COONS/BENSON .038X145CM (WIRE) IMPLANT

## 2015-02-13 NOTE — Anesthesia Procedure Notes (Signed)
Procedure Name: LMA Insertion Date/Time: 02/13/2015 7:41 AM Performed by: Deliah Boston Pre-anesthesia Checklist: Patient identified, Emergency Drugs available, Suction available and Patient being monitored Patient Re-evaluated:Patient Re-evaluated prior to inductionOxygen Delivery Method: Circle system utilized Preoxygenation: Pre-oxygenation with 100% oxygen Intubation Type: IV induction Ventilation: Mask ventilation without difficulty LMA: LMA inserted LMA Size: 4.0 Number of attempts: 1 Placement Confirmation: positive ETCO2 and breath sounds checked- equal and bilateral Tube secured with: Tape Dental Injury: Teeth and Oropharynx as per pre-operative assessment

## 2015-02-13 NOTE — Transfer of Care (Signed)
Immediate Anesthesia Transfer of Care Note  Patient: Denise Avila  Procedure(s) Performed: Procedure(s): LEFT URETEROSCOPY WITH HOLMIUM LASER AND STENT PLACEMENT (Left) HOLMIUM LASER APPLICATION (N/A)  Patient Location: PACU  Anesthesia Type:General  Level of Consciousness: Patient easily awoken, sedated, comfortable, cooperative, following commands, responds to stimulation.   Airway & Oxygen Therapy: Patient spontaneously breathing, ventilating well, oxygen via simple oxygen mask.  Post-op Assessment: Report given to PACU RN, vital signs reviewed and stable, moving all extremities.   Post vital signs: Reviewed and stable.  Complications: No apparent anesthesia complications

## 2015-02-13 NOTE — Op Note (Signed)
Preoperative diagnosis: Left ureteral stone Postoperative diagnosis: Left ureteral stone  Procedure: Cystoscopy, left ureteroscopy with holmium laser lithotripsy, stone basket extraction, left ureteral stent exchange  Surgeon: Lurae Hornbrook  Type of anesthesia: Gen.  Indication for procedure: Patient is a 39 year old female with a pyelonephritis and obstructing left distal ureteral stone. She was stented about 2 weeks ago and brought back today for definitive stone management with left ureteroscopy. She has been well on Levaquin.  Findings: In the left distal ureter stone was located and mostly dusted. The 2 largest fragments were retrieved with basket extraction.  Patient had no rash or obvious reaction after 2 g of cefazolin.  Description of procedure: After consent was obtained patient brought to the operating room. After adequate anesthesia she was placed in lithotomy position and prepped and draped in the usual sterile fashion. A timeout was performed to confirm the patient and procedure. The cystoscope was passed per urethra and the left ureteral stent grasped and removed through the urethral meatus after the bladder was triple rinsed. A sensor wire was advanced and cold in the collecting system. The semirigid ureteroscope was advanced adjacent to the wire after the stent was removed. The stone was located. A tuner microns laser fiber was deployed. The stone was fragmented into small pieces is a setting of 0.2-0.4 energy and 20-40 on the rate. Once it was whittled down to 2 small pieces and Nitinol basket was deployed and the 2 pieces were retrieved. Ureteroscope was then passed up over the iliacs into the proximal ureter without difficulty. There were no other stone fragments. The ureter appeared normal. The wire was in the lumen of the ureter. As the scope was withdrawn again there were no significant fragments in the ureteral injury. The wire was backloaded on the cystoscope and a 6 x 26 and a  meter stent was advanced. The wire was removed with a good coil reconstituting in the collecting system and a good coil in the bladder. The bladder was drained and the scope removed. She was awakened and taken to the recovery room in stable condition.  Complications: None Blood loss: Minimal  Specimens: Stone fragments to office lab  Drains: 6 x 26 cm left ureteral stent with string.

## 2015-02-13 NOTE — Anesthesia Postprocedure Evaluation (Signed)
Anesthesia Post Note  Patient: Denise Avila  Procedure(s) Performed: Procedure(s) (LRB): LEFT URETEROSCOPY WITH HOLMIUM LASER AND STENT PLACEMENT (Left) HOLMIUM LASER APPLICATION (N/A)  Anesthesia type: General  Patient location: PACU  Post pain: Pain level controlled  Post assessment: Post-op Vital signs reviewed  Last Vitals: BP 95/56 mmHg  Pulse 61  Temp(Src) 36.8 C (Oral)  Resp 16  Ht 6' (1.829 m)  Wt 198 lb (89.812 kg)  BMI 26.85 kg/m2  SpO2 98%  LMP 02/03/2015  Post vital signs: Reviewed  Level of consciousness: sedated  Complications: No apparent anesthesia complications

## 2015-02-13 NOTE — Discharge Instructions (Signed)
Ureteral Stent Implantation, Care After Refer to this sheet in the next few weeks. These instructions provide you with information on caring for yourself after your procedure. Your health care provider may also give you more specific instructions. Your treatment has been planned according to current medical practices, but problems sometimes occur. Call your health care provider if you have any problems or questions after your procedure. WHAT TO EXPECT AFTER THE PROCEDURE You should be back to normal activity within 48 hours after the procedure. Nausea and vomiting may occur and are commonly the result of anesthesia. It is common to experience sharp pain in the back or lower abdomen and penis with voiding. This is caused by movement of the ends of the stent with the act of urinating.It usually goes away within minutes after you have stopped urinating. HOME CARE INSTRUCTIONS Make sure to drink plenty of fluids. You may have small amounts of bleeding, causing your urine to be red. This is normal. Certain movements may trigger pain or a feeling that you need to urinate. You may be given medicines to prevent infection or bladder spasms. Be sure to take all medicines as directed. Only take over-the-counter or prescription medicines for pain, discomfort, or fever as directed by your health care provider. Do not take aspirin, as this can make bleeding worse.  Removal of the stent: Remove the stent by pulling the string with slow steady pressure on Monday morning, 02/16/2015.  Be sure to keep all follow-up appointments so your health care provider can check that you are healing properly. An ultrasound will be taken to make sure the ureter remains open.   SEEK MEDICAL CARE IF:  You experience increasing pain.  Your pain medicine is not working. SEEK IMMEDIATE MEDICAL CARE IF:  Your urine is dark red or has blood clots.  You are leaking urine (incontinent).  You have a fever, chills, feeling sick to  your stomach (nausea), or vomiting.  Your pain is not relieved by pain medicine.  The end of the stent comes out of the urethra.  You are unable to urinate. Document Released: 05/22/2013 Document Revised: 09/24/2013 Document Reviewed: 05/22/2013 [facility] Patient Information 2015 [place], [place]. This information is not intended to replace advice given to you by your health care provider. Make sure you discuss any questions you have with your health care provider.

## 2015-02-13 NOTE — Progress Notes (Signed)
Pt now is complaining of nausea with headache.  Flank pain unchanged.  Dr. Junious Silk of aware of pt's situation as well as they need pain medication and pyridium refilled.

## 2015-02-13 NOTE — H&P (Addendum)
H&P  History of Present Illness: Pt presents for left distal ureteral stone management. She was stented about 2 weeks ago for left pyelonephritis (e coli) and has been on Levaquin. No dysuria or fever.   Past Medical History  Diagnosis Date  . Endometriosis   . Migraines   . History of kidney stones    Past Surgical History  Procedure Laterality Date  . Ablation      uterine  . Dilation and curettage of uterus    . Cystoscopy w/ ureteral stent placement Left 02/02/2015    Procedure: CYSTOSCOPY WITH RETROGRADE PYELOGRAM/URETERAL STENT PLACEMENT;  Surgeon: Festus Aloe, MD;  Location: [facility];  Service: Urology;  Laterality: Left;  . Laparoscopy      x 4    Home Medications:  Prescriptions prior to admission  Medication Sig Dispense Refill Last Dose  . citalopram (CELEXA) 20 MG tablet Take 20 mg by mouth at bedtime.   02/12/2015 at 2200  . HYDROcodone-acetaminophen (NORCO/VICODIN) 5-325 MG per tablet Take 1-2 tablets by mouth every 4 (four) hours as needed for moderate pain or severe pain. 30 tablet 0 02/13/2015 at 0430  . levofloxacin (LEVAQUIN) 500 MG tablet Take 1 tablet (500 mg total) by mouth at bedtime. 5 tablet 0 02/12/2015 at 2200  . phenazopyridine (PYRIDIUM) 100 MG tablet Take 1 tablet (100 mg total) by mouth 3 (three) times daily with meals. 10 tablet 0 02/12/2015 at 1800  . tamsulosin (FLOMAX) 0.4 MG CAPS capsule Take 1 capsule (0.4 mg total) by mouth daily after supper. 30 capsule 0 02/12/2015 at 2200  . traZODone (DESYREL) 50 MG tablet Take 50 mg by mouth at bedtime.   02/12/2015 at 2200   Allergies:  Allergies  Allergen Reactions  . Amoxicillin     Rash all over [place]  . Ciprofloxacin Diarrhea and Nausea Only  . Penicillins Rash    Family History  Problem Relation Age of Onset  . Hypertension Mother   . Cancer - Other Father   . Diabetes Mellitus II Maternal Grandmother   . Heart disease Maternal Grandmother   . Cancer - Lung Maternal Grandfather   .  Osteoporosis Paternal Grandmother   . Melanoma Paternal Grandfather    Social History:  reports that she has never smoked. She has never used smokeless tobacco. She reports that she does not drink alcohol or use illicit drugs.  ROS: A complete review of systems was performed.  All systems are negative except for pertinent findings as noted. Review of Systems  Neurological: Positive for headaches.     Physical Exam:  Vital signs in last 24 hours: Temp:  [98 F (36.7 C)] 98 F (36.7 C) (05/13 0539) Pulse Rate:  [88] 88 (05/13 0539) Resp:  [18] 18 (05/13 0539) BP: (104)/(71) 104/71 mmHg (05/13 0539) SpO2:  [98 %] 98 % (05/13 0539) Weight:  [89.812 kg (198 lb)] 89.812 kg (198 lb) (05/13 0539) General:  Alert and oriented, No acute distress HEENT: Normocephalic, atraumatic Neck: No JVD or lymphadenopathy Cardiovascular: Regular rate and rhythm Lungs: Regular rate and effort Abdomen: Soft, nontender, nondistended, no abdominal masses Back: No CVA tenderness Extremities: No edema Neurologic: Grossly intact  Laboratory Data:  Results for orders placed or performed during the hospital encounter of 02/13/15 (from the past 24 hour(s))  hCG, serum, qualitative     Status: None   Collection Time: 02/13/15  6:30 AM  Result Value Ref Range   Preg, Serum NEGATIVE NEGATIVE   No results found for this  or any previous visit (from the past 240 hour(s)). Creatinine:  Recent Labs  02/07/15 0423 02/08/15 0508 02/09/15 0451  CREATININE 0.50 0.58 0.65    Impression/Assessment/plan: I discussed with the patient the nature, potential benefits, risks and alternatives to left URS/HLL/stent, including side effects of the proposed treatment, the likelihood of the patient achieving the goals of the procedure, and any potential problems that might occur during the procedure or recuperation. All questions answered. Patient elects to proceed.   Add: pt with "rash" with amoxicillin". Discussed  nature r/b/a to use of cefazolin IV. She elects to proceed.   Forestine Macho 02/13/2015, 7:14 AM

## 2015-02-13 NOTE — Anesthesia Preprocedure Evaluation (Signed)
Anesthesia Evaluation  Patient identified by MRN, date of birth, ID band Patient awake    Reviewed: Allergy & Precautions, NPO status   Airway Mallampati: III  TM Distance: >3 FB Neck ROM: Full    Dental no notable dental hx. (+) Teeth Intact, Partial Upper,    Pulmonary neg pulmonary ROS,  breath sounds clear to auscultation  Pulmonary exam normal       Cardiovascular negative cardio ROS Normal cardiovascular examRhythm:Regular Rate:Normal     Neuro/Psych  Headaches, negative psych ROS   GI/Hepatic negative GI ROS, Neg liver ROS,   Endo/Other    Renal/GU Renal disease (Left ureteral calculus)Left ureteral calculus Acute pyelonephritis     Musculoskeletal negative musculoskeletal ROS (+)   Abdominal (+)  Abdomen: soft and tender.    Peds  Hematology negative hematology ROS (+)   Anesthesia Other Findings   Reproductive/Obstetrics negative OB ROS Hx/o Endometriosis                             Anesthesia Physical  Anesthesia Plan  ASA: I  Anesthesia Plan: General   Post-op Pain Management:    Induction: Intravenous  Airway Management Planned: LMA  Additional Equipment: None  Intra-op Plan:   Post-operative Plan: Extubation in OR  Informed Consent: I have reviewed the patients History and Physical, chart, labs and discussed the procedure including the risks, benefits and alternatives for the proposed anesthesia with the patient or authorized representative who has indicated his/her understanding and acceptance.   Dental advisory given  Plan Discussed with: CRNA  Anesthesia Plan Comments:         Anesthesia Quick Evaluation

## 2015-02-13 NOTE — Progress Notes (Signed)
Mrs. Dula c/o of severe headache without nausea.  Her left flank pain is tolerable per pt.  Room darkened,Ice pack on head and IV tylenol infusing. Pt resting ok without any other complaints.

## 2015-02-16 ENCOUNTER — Encounter (HOSPITAL_COMMUNITY): Payer: Self-pay | Admitting: Urology

## 2015-02-24 ENCOUNTER — Encounter (HOSPITAL_COMMUNITY): Payer: Self-pay | Admitting: Emergency Medicine

## 2015-02-24 ENCOUNTER — Emergency Department (HOSPITAL_COMMUNITY)
Admission: EM | Admit: 2015-02-24 | Discharge: 2015-02-24 | Disposition: A | Discharge status: Home / Self Care | Payer: BLUE CROSS/BLUE SHIELD | Attending: Emergency Medicine | Admitting: Emergency Medicine

## 2015-02-24 ENCOUNTER — Emergency Department (HOSPITAL_COMMUNITY): Payer: BLUE CROSS/BLUE SHIELD

## 2015-02-24 DIAGNOSIS — Z792 Long term (current) use of antibiotics: Secondary | ICD-10-CM | POA: Diagnosis not present

## 2015-02-24 DIAGNOSIS — Z8679 Personal history of other diseases of the circulatory system: Secondary | ICD-10-CM | POA: Diagnosis not present

## 2015-02-24 DIAGNOSIS — Z87442 Personal history of urinary calculi: Secondary | ICD-10-CM | POA: Diagnosis not present

## 2015-02-24 DIAGNOSIS — R091 Pleurisy: Secondary | ICD-10-CM | POA: Diagnosis not present

## 2015-02-24 DIAGNOSIS — Z880 Allergy status to penicillin: Secondary | ICD-10-CM | POA: Insufficient documentation

## 2015-02-24 DIAGNOSIS — Z79899 Other long term (current) drug therapy: Secondary | ICD-10-CM | POA: Insufficient documentation

## 2015-02-24 DIAGNOSIS — R52 Pain, unspecified: Secondary | ICD-10-CM

## 2015-02-24 DIAGNOSIS — Z8742 Personal history of other diseases of the female genital tract: Secondary | ICD-10-CM | POA: Diagnosis not present

## 2015-02-24 DIAGNOSIS — R079 Chest pain, unspecified: Secondary | ICD-10-CM | POA: Diagnosis present

## 2015-02-24 LAB — CBC
HCT: 39.7 % (ref 36.0–46.0)
HEMOGLOBIN: 13.1 g/dL (ref 12.0–15.0)
MCH: 29 pg (ref 26.0–34.0)
MCHC: 33 g/dL (ref 30.0–36.0)
MCV: 87.8 fL (ref 78.0–100.0)
PLATELETS: 197 10*3/uL (ref 150–400)
RBC: 4.52 MIL/uL (ref 3.87–5.11)
RDW: 13 % (ref 11.5–15.5)
WBC: 5 10*3/uL (ref 4.0–10.5)

## 2015-02-24 LAB — I-STAT TROPONIN, ED: Troponin i, poc: 0 ng/mL (ref 0.00–0.08)

## 2015-02-24 LAB — BASIC METABOLIC PANEL
Anion gap: 11 (ref 5–15)
BUN: 12 mg/dL (ref 6–20)
CALCIUM: 9.2 mg/dL (ref 8.9–10.3)
CO2: 25 mmol/L (ref 22–32)
Chloride: 105 mmol/L (ref 101–111)
Creatinine, Ser: 0.76 mg/dL (ref 0.44–1.00)
GFR calc Af Amer: >60 mL/min (ref >60)
GFR calc non Af Amer: >60 mL/min (ref >60)
Glucose, Bld: 98 mg/dL (ref 65–99)
Potassium: 3.9 mmol/L (ref 3.5–5.1)
Sodium: 141 mmol/L (ref 135–145)

## 2015-02-24 MED ORDER — ONDANSETRON 8 MG PO TBDP: 8.0000 mg | ORAL_TABLET | Freq: Three times a day (TID) | ORAL | Status: DC | PRN

## 2015-02-24 MED ORDER — IOHEXOL 350 MG/ML SOLN
100.0000 mL | Freq: Once | INTRAVENOUS | Status: AC | PRN
  Administered 2015-02-24: 100 mL via INTRAVENOUS

## 2015-02-24 MED ORDER — ONDANSETRON HCL 4 MG/2ML IJ SOLN
4.0000 mg | Freq: Once | INTRAMUSCULAR | Status: AC
  Administered 2015-02-24: 4 mg via INTRAVENOUS
  Filled 2015-02-24: qty 2

## 2015-02-24 MED ORDER — HYDROMORPHONE HCL 1 MG/ML IJ SOLN
1.0000 mg | Freq: Once | INTRAMUSCULAR | Status: AC
  Administered 2015-02-24: 1 mg via INTRAVENOUS
  Filled 2015-02-24: qty 1

## 2015-02-24 MED ORDER — OXYCODONE-ACETAMINOPHEN 7.5-325 MG PO TABS: 1.0000 | ORAL_TABLET | ORAL | Status: DC | PRN

## 2015-02-24 MED ORDER — SODIUM CHLORIDE 0.9 % IV SOLN
INTRAVENOUS | Status: DC
  Administered 2015-02-24: 14:00:00 via INTRAVENOUS

## 2015-02-24 MED ORDER — SUCRALFATE 1 G PO TABS: 1.0000 g | ORAL_TABLET | Freq: Four times a day (QID) | ORAL | Status: DC

## 2015-02-24 MED ORDER — PREDNISONE 20 MG PO TABS: 40.0000 mg | ORAL_TABLET | Freq: Every day | ORAL | Status: DC

## 2015-02-24 MED ORDER — KETOROLAC TROMETHAMINE 30 MG/ML IJ SOLN
30.0000 mg | Freq: Once | INTRAMUSCULAR | Status: AC
  Administered 2015-02-24: 30 mg via INTRAVENOUS
  Filled 2015-02-24: qty 1

## 2015-02-24 NOTE — ED Notes (Signed)
MD at bedside. 

## 2015-02-24 NOTE — ED Notes (Signed)
Patient transported to CT 

## 2015-02-24 NOTE — ED Notes (Signed)
Pt from [facility]. Post ureteroscopy and JJ placement on May 13th and subsequent JJ removal. Now having mid and left sided chest pain radiating to right flank described as "pressure." Also having dysuria but no hematuria. MD would like rule out of PE or retained chip of left ureteral stone. Denies vomiting or diarrhea but has had nausea. No other c/c.

## 2015-02-24 NOTE — Discharge Instructions (Signed)

## 2015-02-24 NOTE — ED Notes (Signed)
Pt, being sent by Gaynelle Arabian MD, r/o PE and obstructive stone.   C/o dysuria and chest pain radiating into L arm.  Pt had stone removal 5/13.

## 2015-02-24 NOTE — ED Provider Notes (Addendum)
CSN: [id number]     Arrival date & time 02/24/15  1238 History   First MD Initiated Contact with Patient 02/24/15 1304     Chief Complaint  Patient presents with  . Chest Pain  . Flank Pain    left     (Consider location/radiation/quality/duration/timing/severity/associated sxs/prior Treatment) HPI Comments: Patient complaining of persistent left-sided pleuritic chest pain times 3 weeks. Pain is also worse with exertion.pain does not radiate to the patient's left arm ago through to her back. No associated syncope or near-syncope. Denies any leg pain or swelling. No prior history of DVT. They have a cough several days ago but since resolved. No fever or chills. Also had recent surgery for kidney stone but denies any hematuria or dysuria. No vomiting or diarrhea. Saw her urologist today who sent her here for evaluation of possible pulmonary embolism versus worsening renal colic  Patient is a 39 y.o. female presenting with chest pain and flank pain. The history is provided by the patient.  Chest Pain Flank Pain Associated symptoms include chest pain.    Past Medical History  Diagnosis Date  . Endometriosis   . Migraines   . History of kidney stones    Past Surgical History  Procedure Laterality Date  . Ablation      uterine  . Dilation and curettage of uterus    . Cystoscopy w/ ureteral stent placement Left 02/02/2015    Procedure: CYSTOSCOPY WITH RETROGRADE PYELOGRAM/URETERAL STENT PLACEMENT;  Surgeon: Festus Aloe, MD;  Location: [facility];  Service: Urology;  Laterality: Left;  . Laparoscopy      x 4  . Cystoscopy with ureteroscopy and stent placement Left 02/13/2015    Procedure: LEFT URETEROSCOPY WITH HOLMIUM LASER AND STENT PLACEMENT;  Surgeon: Festus Aloe, MD;  Location: [facility];  Service: Urology;  Laterality: Left;  . Holmium laser application N/A 1/32/4401    Procedure: HOLMIUM LASER APPLICATION;  Surgeon: Festus Aloe, MD;  Location: [facility];  Service: Urology;   Laterality: N/A;   Family History  Problem Relation Age of Onset  . Hypertension Mother   . Cancer - Other Father   . Diabetes Mellitus II Maternal Grandmother   . Heart disease Maternal Grandmother   . Cancer - Lung Maternal Grandfather   . Osteoporosis Paternal Grandmother   . Melanoma Paternal Grandfather    History  Substance Use Topics  . Smoking status: Never Smoker   . Smokeless tobacco: Never Used  . Alcohol Use: No   OB History    No data available     Review of Systems  Cardiovascular: Positive for chest pain.  Genitourinary: Positive for flank pain.  All other systems reviewed and are negative.     Allergies  Amoxicillin; Ciprofloxacin; and Penicillins  Home Medications   Prior to Admission medications   Medication Sig Start Date End Date Taking? Authorizing Provider  citalopram (CELEXA) 20 MG tablet Take 20 mg by mouth at bedtime.    Historical Provider, MD  HYDROcodone-acetaminophen (NORCO/VICODIN) 5-325 MG per tablet Take 1-2 tablets by mouth every 4 (four) hours as needed for moderate pain or severe pain. 02/10/15   Velvet Bathe, MD  levofloxacin (LEVAQUIN) 500 MG tablet Take 1 tablet (500 mg total) by mouth at bedtime. 02/10/15   Velvet Bathe, MD  phenazopyridine (PYRIDIUM) 100 MG tablet Take 1 tablet (100 mg total) by mouth 3 (three) times daily with meals. 02/10/15   Velvet Bathe, MD  tamsulosin (FLOMAX) 0.4 MG CAPS capsule Take 1 capsule (  0.4 mg total) by mouth daily after supper. 02/10/15   Velvet Bathe, MD  traZODone (DESYREL) 50 MG tablet Take 50 mg by mouth at bedtime.    Historical Provider, MD   BP 107/73 mmHg  Pulse 86  Temp(Src) 98.2 F (36.8 C) (Oral)  Resp 16  SpO2 100%  LMP 02/03/2015 Physical Exam  Constitutional: She is oriented to person, place, and time. She appears well-developed and well-nourished.  Non-toxic appearance. No distress.  HENT:  Head: Normocephalic and atraumatic.  Eyes: Conjunctivae, EOM and lids are normal. Pupils  are equal, round, and reactive to light.  Neck: Normal range of motion. Neck supple. No tracheal deviation present. No thyroid mass present.  Cardiovascular: Normal rate, regular rhythm and normal heart sounds.  Exam reveals no gallop.   No murmur heard. Pulmonary/Chest: Effort normal and breath sounds normal. No stridor. No respiratory distress. She has no decreased breath sounds. She has no wheezes. She has no rhonchi. She has no rales.  Abdominal: Soft. Normal appearance and bowel sounds are normal. She exhibits no distension. There is no tenderness. There is no rebound and no CVA tenderness.  Musculoskeletal: Normal range of motion. She exhibits no edema or tenderness.  Neurological: She is alert and oriented to person, place, and time. She has normal strength. No cranial nerve deficit or sensory deficit. GCS eye subscore is 4. GCS verbal subscore is 5. GCS motor subscore is 6.  Skin: Skin is warm and dry. No abrasion and no rash noted.  Psychiatric: She has a normal mood and affect. Her speech is normal and behavior is normal.  Nursing note and vitals reviewed.   ED Course  Procedures (including critical care time) Labs Review Labs Reviewed  BASIC METABOLIC PANEL  [place], ED    Imaging Review No results found.   EKG Interpretation   Date/Time:  Tuesday Feb 24 2015 13:01:36 EDT Ventricular Rate:  81 PR Interval:  149 QRS Duration: 86 QT Interval:  410 QTC Calculation: 476 R Axis:   7 Text Interpretation:  Sinus rhythm Borderline T abnormalities, anterior  leads Confirmed by Erica Richwine  MD, Atzel Mccambridge ([id number]) on 02/24/2015 1:21:07 PM      MDM   Final diagnoses:  Pain  Pain   Patient's chest abdominal CT negative for acute process. Patient given pain meds here and does so more colorful. Suspect the patient has pleurisy. Given Toradol IV here. Will place on anti-inflammatory discharge return precautions given     Lacretia Leigh, MD 02/24/15 1544  Lacretia Leigh, MD 02/24/15 [phone]

## 2015-04-13 ENCOUNTER — Encounter: Payer: Self-pay | Admitting: Gynecologic Oncology

## 2015-04-13 ENCOUNTER — Ambulatory Visit: Payer: BLUE CROSS/BLUE SHIELD | Attending: Gynecologic Oncology | Admitting: Gynecologic Oncology

## 2015-04-13 VITALS — BP 106/66 | HR 66 | Temp 98.5°F | Resp 18 | Ht 72.0 in | Wt 199.3 lb

## 2015-04-13 DIAGNOSIS — R87619 Unspecified abnormal cytological findings in specimens from cervix uteri: Secondary | ICD-10-CM

## 2015-04-13 NOTE — Progress Notes (Signed)
Consult Note: Gyn-Onc  Consult was requested by Dr. Nelda Marseille for the evaluation of Denise Avila 39 y.o. female with endometriosis and pelvic pain  CC:  Chief Complaint  Patient presents with  . Abnormal Pap Smear  . Endometriosis    Assessment/Plan:  Ms. Denise Avila  is a 39 y.o.  year old with abnormal uterine bleeding, endometriosis, chronic pelvic pain who has failed medical management with OCP's and IUD's and desires definitive surgical management with a hysterectomy.  I discussed with [name] that in order for surgery to resolve both her symptoms of bleeding and pain she would require hysterectomy with BSO because endometriosis in the pelvis would continue to be stimulated by residual ovarian tissue if a hysterectomy alone was performed, and therefore her symptoms would not be alleviated. Discussed that oophorectomy in premenopausal women in the absence of malignancy is associated with an earlier onset of course mortality. I discussed that we may be able to mitigate some of this with supplying the hormone replacement therapy (estrogenic only preparation) until age of natural menopause. This has not been associated with progression of endometriosis, or increased risk for breast cancer.  I discussed that in patients with documented severe pelvic adhesive disease there is substantial increased operative risks. These risks particular pertained to operative injury of visceral organs such as bowel, bladder, or ureters. I discussed that such injuries may be occult and only recognized postoperatively and require readmission, reoperation, and substantial postsurgical morbidity. I discussed that alternatives to therapy that the patient could consider would be pain management, and additional trials at medical therapy. However I agree with the patient that she has had an adequate trial of conservative medical therapies in the past and that surgical intervention is reasonable to entertain at this time  if she feels comfortable accepting the unavoidable surgical risks.  I also explained that her pelvic pain symptoms may not be cured by surgery, particularly if she has established central defects in pain perception or if her pain is caused by a non-gynecologic etiology. I also discussed that surgery can invoke injury that is new and a new source for ongoing pain.  I think she would be a good candidate for a minimally invasive approach with robotic assisted total hysterectomy with BSO. I discussed that conversion to laparotomy may be necessary if completion  Via is minimally invasive route is not felt to be safe. I discussed with the patient anticipated postoperative recovery (overnight stay) and restrictions postoperatively. I discussed operative risk including  bleeding, infection, damage to internal organs (such as bladder,ureters, bowels), blood clot, reoperation and rehospitalization.   HPI: Denise Avila is a very pleasant 39 year old woman who is seen in consultation at the request of Dr Nelda Marseille for a 20 year history of chronic pelvic pain, and abnormal uterine bleeding. She used oral contraception of pills for approximately 10 years in her teens and early 52s for manner menorrhagia. This failed to ameliorate her symptoms. She went on to attempt an intrauterine device for control symptoms however this became embedded into the uterine wall requiring retrieval (in the office). She again trial oral contraception of pills more recently for 3-4 month. However this did not control her symptoms. She underwent an endometrial ablation in 2004 however postoperatively continued to have irregular vaginal spotting and occasional bright red bleeding. She was being prepared for a hysterectomy when she took a preoperative pregnancy test which revealed an unexpected pregnancy. She has a history for this of primary infertility felt to be secondary  to endometriosis. She also had a history of protestant insufficiency and  pregnancy had experienced 6 spontaneous miscarriages. However she was successful in carrying this unexpected pregnancy to term.  Since delivery of her son she's continued to have chronic pelvic pain. It is unclear if this cyclic or constant. She takes Aleve for the pain however this that she is minimal release. She has bleeding most days of the month but his spotting and occasional bright red bleeding more consistent with a period. He strongly desires definitive surgical intervention.  She has a remote history of abnormal Pap smears with a history of cryotherapy and possibly a LEEP procedure. In July 2016 she had an ASCUS Pap that was HPV negative.  She has had for laparoscopies in the past for pelvic pain and infertility. She was told by her gynecologist at the time that should severe pelvic adhesive disease. She did not have resection of any gynecologic tissue during these surgeries per patient. She is no significant family history for breast or ovarian cancer. Her father had liver cancer, maternal grandfather had lung cancer, a paternal grandfather had melanoma.\  Current Meds:  Outpatient Encounter Prescriptions as of 04/13/2015  Medication Sig  . citalopram (CELEXA) 20 MG tablet Take 20 mg by mouth at bedtime.  . clonazePAM (KLONOPIN) 0.5 MG tablet Take 0.5 mg by mouth 2 (two) times daily as needed for anxiety.  . diclofenac (VOLTAREN) 75 MG EC tablet Take 75 mg by mouth 2 (two) times daily.  Marland Kitchen docusate sodium (COLACE) 100 MG capsule Take 100 mg by mouth daily.  . ondansetron (ZOFRAN ODT) 8 MG disintegrating tablet Take 1 tablet (8 mg total) by mouth every 8 (eight) hours as needed for nausea or vomiting.  . phenazopyridine (PYRIDIUM) 100 MG tablet Take 1 tablet (100 mg total) by mouth 3 (three) times daily with meals.  . sucralfate (CARAFATE) 1 G tablet Take 1 tablet (1 g total) by mouth 4 (four) times daily.  . traZODone (DESYREL) 50 MG tablet Take 100 mg by mouth at bedtime.   Marland Kitchen  HYDROcodone-acetaminophen (NORCO/VICODIN) 5-325 MG per tablet Take 1-2 tablets by mouth every 4 (four) hours as needed for moderate pain or severe pain. (Patient not taking: Reported on 04/13/2015)  . oxyCODONE-acetaminophen (PERCOCET) 7.5-325 MG per tablet Take 1 tablet by mouth every 4 (four) hours as needed for severe pain. (Patient not taking: Reported on 04/13/2015)  . tamsulosin (FLOMAX) 0.4 MG CAPS capsule Take 1 capsule (0.4 mg total) by mouth daily after supper. (Patient not taking: Reported on 04/13/2015)  . [DISCONTINUED] levofloxacin (LEVAQUIN) 500 MG tablet Take 1 tablet (500 mg total) by mouth at bedtime. (Patient not taking: Reported on 02/24/2015)  . [DISCONTINUED] predniSONE (DELTASONE) 20 MG tablet Take 2 tablets (40 mg total) by mouth daily. (Patient not taking: Reported on 04/13/2015)   No facility-administered encounter medications on file as of 04/13/2015.    Allergy:  Allergies  Allergen Reactions  . Amoxicillin     Rash all over [place]  . Ciprofloxacin Diarrhea and Nausea Only  . Penicillins Rash    Social Hx:   History   Social History  . Marital Status: Married    Spouse Name: N/A  . Number of Children: N/A  . Years of Education: N/A   Occupational History  . Not on file.   Social History Main Topics  . Smoking status: Never Smoker   . Smokeless tobacco: Never Used  . Alcohol Use: No  . Drug Use: No  .  Sexual Activity: Yes   Other Topics Concern  . Not on file   Social History Narrative    Past Surgical Hx:  Past Surgical History  Procedure Laterality Date  . Ablation      uterine  . Dilation and curettage of uterus    . Cystoscopy w/ ureteral stent placement Left 02/02/2015    Procedure: CYSTOSCOPY WITH RETROGRADE PYELOGRAM/URETERAL STENT PLACEMENT;  Surgeon: Festus Aloe, MD;  Location: [facility];  Service: Urology;  Laterality: Left;  . Laparoscopy      x 4  . Cystoscopy with ureteroscopy and stent placement Left 02/13/2015    Procedure: LEFT  URETEROSCOPY WITH HOLMIUM LASER AND STENT PLACEMENT;  Surgeon: Festus Aloe, MD;  Location: [facility];  Service: Urology;  Laterality: Left;  . Holmium laser application N/A [id number]    Procedure: HOLMIUM LASER APPLICATION;  Surgeon: Festus Aloe, MD;  Location: [facility];  Service: Urology;  Laterality: N/A;  . Melanoma excision  2000    melanoma removed from face, basal cell removed from nose    Past Medical Hx:  Past Medical History  Diagnosis Date  . Endometriosis   . Migraines   . History of kidney stones   . Pleurisy 02/2015    while hospitalized   . Fibroids     age 86  . Abnormal Pap smear of cervix 12/2014    Past Gynecological History:  + abnrmal paps in past, + laparoscopies for inferiltity, endometriosis. SVD x 1.  No LMP recorded.  Family Hx:  Family History  Problem Relation Age of Onset  . Hypertension Mother   . Cancer - Other Father     liver  . Diabetes Mellitus II Maternal Grandmother   . Heart disease Maternal Grandmother   . Cancer - Lung Maternal Grandfather   . Osteoporosis Paternal Grandmother   . Melanoma Paternal Grandfather     Review of Systems:  Constitutional  Feels well,    ENT Normal appearing ears and nares bilaterally Skin/Breast  No rash, sores, jaundice, itching, dryness Cardiovascular  No chest pain, shortness of breath, or edema  Pulmonary  No cough or wheeze.  Gastro Intestinal  No nausea, vomitting, or diarrhoea. No bright red blood per rectum, no abdominal pain, change in bowel movement, or constipation.  Genito Urinary  No frequency, urgency, dysuria, see HPI Musculo Skeletal  No myalgia, arthralgia, joint swelling or pain  Neurologic  No weakness, numbness, change in gait,  Psychology  No depression, anxiety, insomnia.   Vitals:  Blood pressure 106/66, pulse 66, temperature 98.5 F (36.9 C), temperature source Oral, resp. rate 18, height 6' (1.829 m), weight 199 lb 4.8 oz (90.402 kg), SpO2 100 %.  Physical  Exam: WD in NAD Neck  Supple NROM, without any enlargements.  Lymph Node Survey No cervical supraclavicular or inguinal adenopathy Cardiovascular  Pulse normal rate, regularity and rhythm. S1 and S2 normal.  Lungs  Clear to auscultation bilateraly, without wheezes/crackles/rhonchi. Good air movement.  Skin  No rash/lesions/breakdown  Psychiatry  Alert and oriented to person, place, and time  Abdomen  Normoactive bowel sounds, abdomen soft, non-tender and overweight without evidence of hernia.  Back No CVA tenderness Genito Urinary  Vulva/vagina: Normal external female genitalia.  No lesions. No discharge or bleeding.  Bladder/urethra:  No lesions or masses, well supported bladder  Vagina: normal  Cervix: Normal appearing, no lesions.  Uterus: Small, mobile, no parametrial involvement or nodularity.  Adnexa: no palpable masses. Rectal  Good tone, no masses no  cul de sac nodularity.  Extremities  No bilateral cyanosis, clubbing or edema.   Donaciano Eva, MD   04/13/2015, 1:52 PM

## 2015-04-13 NOTE — Patient Instructions (Signed)
Preparing for your Surgery  Plan for surgery on August 18 with Dr. Denman George.  Pre-operative Testing -You will receive a phone call from presurgical testing at [facility] to arrange for a pre-operative testing appointment before your surgery.  This appointment normally occurs one to two weeks before your scheduled surgery.   -Bring your insurance card, copy of an advanced directive if applicable, medication list  -At that visit, you will be asked to sign a consent for a possible blood transfusion in case a transfusion becomes necessary during surgery.  The need for a blood transfusion is rare but having consent is a necessary part of your care.     -You should not be taking blood thinners or aspirin at least ten days prior to surgery unless instructed by your surgeon.  Day Before Surgery at [place] will be asked to take in only clear liquids the day before surgery.  Examples of clear liquids include broths, jello, and clear juices.  No carbonated Beverages. You will be advised to have nothing to eat or drink after midnight the evening before.    Your role in recovery Your role is to become active as soon as directed by your doctor, while still giving yourself time to heal.  Rest when you feel tired. You will be asked to do the following in order to speed your recovery:  - Cough and breathe deeply. This helps toclear and expand your lungs and can prevent pneumonia. You may be given a spirometer to practice deep breathing. A staff member will show you how to use the spirometer. - Do mild physical activity. Walking or moving your legs help your circulation and body functions return to normal. A staff member will help you when you try to walk and will provide you with simple exercises. Do not try to get up or walk alone the first time. - Actively manage your pain. Managing your pain lets you move in comfort. We will ask you to rate your pain on a scale of zero to 10. It is your  responsibility to tell your doctor or nurse where and how much you hurt so your pain can be treated.  Special Considerations -If you are diabetic, you may be placed on insulin after surgery to have closer control over your blood sugars to promote healing and recovery.  This does not mean that you will be discharged on insulin.  If applicable, your oral antidiabetics will be resumed when you are tolerating a solid diet.  -Your final pathology results from surgery should be available by the Friday after surgery and the results will be relayed to you when available.  Blood Transfusion Information WHAT IS A BLOOD TRANSFUSION? A transfusion is the replacement of blood or some of its parts. Blood is made up of multiple cells which provide different functions.  Red blood cells carry oxygen and are used for blood loss replacement.  White blood cells fight against infection.  Platelets control bleeding.  Plasma helps clot blood.  Other blood products are available for specialized needs, such as hemophilia or other clotting disorders. BEFORE THE TRANSFUSION  Who gives blood for transfusions?   You may be able to donate blood to be used at a later date on yourself (autologous donation).  Relatives can be asked to donate blood. This is generally not any safer than if you have received blood from a stranger. The same precautions are taken to ensure safety when a relative's blood is donated.  Healthy volunteers  who are fully evaluated to make sure their blood is safe. This is blood bank blood. Transfusion therapy is the safest it has ever been in the practice of medicine. Before blood is taken from a donor, a complete history is taken to make sure that person has no history of diseases nor engages in risky social behavior (examples are intravenous drug use or sexual activity with multiple partners). The donor's travel history is screened to minimize risk of transmitting infections, such as malaria. The  donated blood is tested for signs of infectious diseases, such as HIV and hepatitis. The blood is then tested to be sure it is compatible with you in order to minimize the chance of a transfusion reaction. If you or a relative donates blood, this is often done in anticipation of surgery and is not appropriate for emergency situations. It takes many days to process the donated blood. RISKS AND COMPLICATIONS Although transfusion therapy is very safe and saves many lives, the main dangers of transfusion include:   Getting an infectious disease.  Developing a transfusion reaction. This is an allergic reaction to something in the blood you were given. Every precaution is taken to prevent this. The decision to have a blood transfusion has been considered carefully by your caregiver before blood is given. Blood is not given unless the benefits outweigh the risks. AFTER THE TRANSFUSION  Right after receiving a blood transfusion, you will usually feel much better and more energetic. This is especially true if your red blood cells have gotten low (anemic). The transfusion raises the level of the red blood cells which carry oxygen, and this usually causes an energy increase.  The nurse administering the transfusion will monitor you carefully for complications. HOME CARE INSTRUCTIONS  No special instructions are needed after a transfusion. You may find your energy is better. Speak with your caregiver about any limitations on activity for underlying diseases you may have. SEEK MEDICAL CARE IF:   Your condition is not improving after your transfusion.  You develop redness or irritation at the intravenous (IV) site. SEEK IMMEDIATE MEDICAL CARE IF:  Any of the following symptoms occur over the next 12 hours:  Shaking chills.  You have a temperature by mouth above 102 F (38.9 C), not controlled by medicine.  Chest, back, or muscle pain.  People around you feel you are not acting correctly or are  confused.  Shortness of breath or difficulty breathing.  Dizziness and fainting.  You get a rash or develop hives.  You have a decrease in urine output.  Your urine turns a dark color or changes to pink, red, or brown. Any of the following symptoms occur over the next 10 days:  You have a temperature by mouth above 102 F (38.9 C), not controlled by medicine.  Shortness of breath.  Weakness after normal activity.  The white part of the eye turns yellow (jaundice).  You have a decrease in the amount of urine or are urinating less often.  Your urine turns a dark color or changes to pink, red, or brown. Document Released: 09/16/2000 Document Revised: 12/12/2011 Document Reviewed: 05/05/2008 [facility] Patient Information 2015 [place], [place]. This information is not intended to replace advice given to you by your health care provider. Make sure you discuss any questions you have with your health care provider.

## 2015-05-15 NOTE — Patient Instructions (Addendum)
YOUR PROCEDURE IS SCHEDULED ON :  05/21/15  REPORT TO [facility] MAIN ENTRANCE FOLLOW SIGNS TO EAST ELEVATOR - GO TO 3rd FLOOR CHECK IN AT 3 EAST NURSES STATION (SHORT STAY) AT:  5:30 AM  CALL THIS NUMBER IF YOU HAVE PROBLEMS THE MORNING OF SURGERY [phone]  REMEMBER:ONLY 1 PER PERSON MAY GO TO SHORT STAY WITH YOU TO GET READY THE MORNING OF YOUR SURGERY  DO NOT EAT FOOD OR DRINK LIQUIDS AFTER MIDNIGHT  TAKE THESE MEDICINES THE MORNING OF SURGERY: CLONAZEPAM / [place] LIQUID DIET  Foods Allowed                                                                     Foods Excluded  Coffee and tea, regular and decaf                             liquids that you cannot  Plain Jell-O in any flavor                                             see through such as: Fruit ices (not with fruit pulp)                                     milk, soups, orange juice  Iced Popsicles                                                 All solid food Carbonated beverages, regular and diet                                    Cranberry, grape and apple juices Sports drinks like Gatorade Lightly seasoned clear broth or consume(fat free) Sugar, honey syrup _____________________________________________________________________  YOU MAY NOT HAVE ANY METAL ON YOUR BODY INCLUDING HAIR PINS AND PIERCING'S. DO NOT WEAR JEWELRY, MAKEUP, LOTIONS, POWDERS OR PERFUMES. DO NOT WEAR NAIL POLISH. DO NOT SHAVE 48 HRS PRIOR TO SURGERY. MEN MAY SHAVE FACE AND NECK.  DO NOT [place]. [facility] IS NOT RESPONSIBLE FOR VALUABLES.  CONTACTS, DENTURES OR PARTIALS MAY NOT BE WORN TO SURGERY. LEAVE SUITCASE IN CAR. CAN BE BROUGHT TO ROOM AFTER SURGERY.  PATIENTS DISCHARGED THE DAY OF SURGERY WILL NOT BE ALLOWED TO DRIVE HOME.  PLEASE READ OVER THE FOLLOWING INSTRUCTION  SHEETS _________________________________________________________________________________                                          [facility] - PREPARING FOR SURGERY  Before surgery, you can play an important role.  Because skin is not sterile, your  skin needs to be as free of germs as possible.  You can reduce the number of germs on your skin by washing with CHG (chlorahexidine gluconate) soap before surgery.  CHG is an antiseptic cleaner which kills germs and bonds with the skin to continue killing germs even after washing. Please DO NOT use if you have an allergy to CHG or antibacterial soaps.  If your skin becomes reddened/irritated stop using the CHG and inform your nurse when you arrive at Short Stay. Do not shave (including legs and underarms) for at least 48 hours prior to the first CHG shower.  You may shave your face. Please follow these instructions carefully:   1.  Shower with CHG Soap the night before surgery and the  morning of Surgery.   2.  If you choose to wash your hair, wash your hair first as usual with your  normal  Shampoo.   3.  After you shampoo, rinse your hair and body thoroughly to remove the  shampoo.                                         4.  Use CHG as you would any other liquid soap.  You can apply chg directly  to the skin and wash . Gently wash with scrungie or clean wascloth    5.  Apply the CHG Soap to your body ONLY FROM THE NECK DOWN.   Do not use on open                           Wound or open sores. Avoid contact with eyes, ears mouth and genitals (private parts).                        Genitals (private parts) with your normal soap.              6.  Wash thoroughly, paying special attention to the area where your surgery  will be performed.   7.  Thoroughly rinse your body with warm water from the neck down.   8.  DO NOT shower/wash with your normal soap after using and rinsing off  the CHG Soap .                9.  Pat yourself dry with a clean  towel.             10.  Wear clean night clothes to bed after shower             11.  Place clean sheets on your bed the night of your first shower and do not  sleep with pets.  Day of Surgery : Do not apply any lotions/deodorants the morning of surgery.  Please wear clean clothes to the hospital/surgery center.  FAILURE TO FOLLOW THESE INSTRUCTIONS MAY RESULT IN THE CANCELLATION OF YOUR SURGERY    PATIENT SIGNATURE_________________________________  ______________________________________________________________________     Denise Avila  An incentive spirometer is a tool that can help keep your lungs clear and active. This tool measures how well you are filling your lungs with each breath. Taking long deep breaths may help reverse or decrease the chance of developing breathing (pulmonary) problems (especially infection) following:  A long period of time when you are unable to move or be active. BEFORE THE PROCEDURE  If the spirometer includes an indicator to show your best effort, your nurse or respiratory therapist will set it to a desired goal.  If possible, sit up straight or lean slightly forward. Try not to slouch.  Hold the incentive spirometer in an upright position. INSTRUCTIONS FOR USE   Sit on the edge of your bed if possible, or sit up as far as you can in bed or on a chair.  Hold the incentive spirometer in an upright position.  Breathe out normally.  Place the mouthpiece in your mouth and seal your lips tightly around it.  Breathe in slowly and as deeply as possible, raising the piston or the ball toward the top of the column.  Hold your breath for 3-5 seconds or for as long as possible. Allow the piston or ball to fall to the bottom of the column.  Remove the mouthpiece from your mouth and breathe out normally.  Rest for a few seconds and repeat Steps 1 through 7 at least 10 times every 1-2 hours when you are awake. Take your time and take a few  normal breaths between deep breaths.  The spirometer may include an indicator to show your best effort. Use the indicator as a goal to work toward during each repetition.  After each set of 10 deep breaths, practice coughing to be sure your lungs are clear. If you have an incision (the cut made at the time of surgery), support your incision when coughing by placing a pillow or rolled up towels firmly against it. Once you are able to get out of bed, walk around indoors and cough well. You may stop using the incentive spirometer when instructed by your caregiver.  RISKS AND COMPLICATIONS  Take your time so you do not get dizzy or light-headed.  If you are in pain, you may need to take or ask for pain medication before doing incentive spirometry. It is harder to take a deep breath if you are having pain. AFTER USE  Rest and breathe slowly and easily.  It can be helpful to keep track of a log of your progress. Your caregiver can provide you with a simple table to help with this. If you are using the spirometer at home, follow these instructions: [place] IF:   You are having difficultly using the spirometer.  You have trouble using the spirometer as often as instructed.  Your pain medication is not giving enough relief while using the spirometer.  You develop fever of 100.5 F (38.1 C) or higher. SEEK IMMEDIATE MEDICAL CARE IF:   You cough up bloody sputum that had not been present before.  You develop fever of 102 F (38.9 C) or greater.  You develop worsening pain at or near the incision site. MAKE SURE YOU:   Understand these instructions.  Will watch your condition.  Will get help right away if you are not doing well or get worse. Document Released: 01/30/2007 Document Revised: 12/12/2011 Document Reviewed: 04/02/2007 ExitCare Patient Information 2014 Exit[employer], [place].   ________________________________________________________________________  WHAT IS A BLOOD  TRANSFUSION? Blood Transfusion Information  A transfusion is the replacement of blood or some of its parts. Blood is made up of multiple cells which provide different functions.  Red blood cells carry oxygen and are used for blood loss replacement.  White blood cells fight against infection.  Platelets control bleeding.  Plasma helps clot blood.  Other blood products are available for specialized needs, such as hemophilia or other clotting  disorders. BEFORE THE TRANSFUSION  Who gives blood for transfusions?   Healthy volunteers who are fully evaluated to make sure their blood is safe. This is blood bank blood. Transfusion therapy is the safest it has ever been in the practice of medicine. Before blood is taken from a donor, a complete history is taken to make sure that person has no history of diseases nor engages in risky social behavior (examples are intravenous drug use or sexual activity with multiple partners). The donor's travel history is screened to minimize risk of transmitting infections, such as malaria. The donated blood is tested for signs of infectious diseases, such as HIV and hepatitis. The blood is then tested to be sure it is compatible with you in order to minimize the chance of a transfusion reaction. If you or a relative donates blood, this is often done in anticipation of surgery and is not appropriate for emergency situations. It takes many days to process the donated blood. RISKS AND COMPLICATIONS Although transfusion therapy is very safe and saves many lives, the main dangers of transfusion include:   Getting an infectious disease.  Developing a transfusion reaction. This is an allergic reaction to something in the blood you were given. Every precaution is taken to prevent this. The decision to have a blood transfusion has been considered carefully by your caregiver before blood is given. Blood is not given unless the benefits outweigh the risks. AFTER THE  TRANSFUSION  Right after receiving a blood transfusion, you will usually feel much better and more energetic. This is especially true if your red blood cells have gotten low (anemic). The transfusion raises the level of the red blood cells which carry oxygen, and this usually causes an energy increase.  The nurse administering the transfusion will monitor you carefully for complications. HOME CARE INSTRUCTIONS  No special instructions are needed after a transfusion. You may find your energy is better. Speak with your caregiver about any limitations on activity for underlying diseases you may have. SEEK MEDICAL CARE IF:   Your condition is not improving after your transfusion.  You develop redness or irritation at the intravenous (IV) site. SEEK IMMEDIATE MEDICAL CARE IF:  Any of the following symptoms occur over the next 12 hours:  Shaking chills.  You have a temperature by mouth above 102 F (38.9 C), not controlled by medicine.  Chest, back, or muscle pain.  People around you feel you are not acting correctly or are confused.  Shortness of breath or difficulty breathing.  Dizziness and fainting.  You get a rash or develop hives.  You have a decrease in urine output.  Your urine turns a dark color or changes to pink, red, or brown. Any of the following symptoms occur over the next 10 days:  You have a temperature by mouth above 102 F (38.9 C), not controlled by medicine.  Shortness of breath.  Weakness after normal activity.  The white part of the eye turns yellow (jaundice).  You have a decrease in the amount of urine or are urinating less often.  Your urine turns a dark color or changes to pink, red, or brown. Document Released: 09/16/2000 Document Revised: 12/12/2011 Document Reviewed: 05/05/2008 [facility] Patient Information 2014 [place], [place].  _______________________________________________________________________

## 2015-05-18 ENCOUNTER — Encounter (HOSPITAL_COMMUNITY)
Admission: RE | Admit: 2015-05-18 | Discharge: 2015-05-18 | Disposition: A | Discharge status: Home / Self Care | Payer: BLUE CROSS/BLUE SHIELD | Source: Ambulatory Visit | Attending: Gynecologic Oncology | Admitting: Gynecologic Oncology

## 2015-05-18 ENCOUNTER — Encounter (HOSPITAL_COMMUNITY): Payer: Self-pay

## 2015-05-18 DIAGNOSIS — N831 Corpus luteum cyst: Secondary | ICD-10-CM | POA: Diagnosis not present

## 2015-05-18 DIAGNOSIS — N802 Endometriosis of fallopian tube: Secondary | ICD-10-CM | POA: Diagnosis not present

## 2015-05-18 DIAGNOSIS — D259 Leiomyoma of uterus, unspecified: Secondary | ICD-10-CM | POA: Diagnosis not present

## 2015-05-18 DIAGNOSIS — R102 Pelvic and perineal pain: Secondary | ICD-10-CM | POA: Diagnosis present

## 2015-05-18 DIAGNOSIS — N830 Follicular cyst of ovary: Secondary | ICD-10-CM | POA: Diagnosis not present

## 2015-05-18 DIAGNOSIS — Z791 Long term (current) use of non-steroidal anti-inflammatories (NSAID): Secondary | ICD-10-CM | POA: Diagnosis not present

## 2015-05-18 DIAGNOSIS — Z79899 Other long term (current) drug therapy: Secondary | ICD-10-CM | POA: Diagnosis not present

## 2015-05-18 DIAGNOSIS — N801 Endometriosis of ovary: Secondary | ICD-10-CM | POA: Diagnosis not present

## 2015-05-18 DIAGNOSIS — N800 Endometriosis of uterus: Secondary | ICD-10-CM | POA: Diagnosis not present

## 2015-05-18 HISTORY — DX: Malignant melanoma of skin, unspecified: C43.9

## 2015-05-18 HISTORY — DX: Depression, unspecified: F32.A

## 2015-05-18 HISTORY — DX: Major depressive disorder, single episode, unspecified: F32.9

## 2015-05-18 LAB — CBC WITH DIFFERENTIAL/PLATELET
Basophils Absolute: 0 10*3/uL (ref 0.0–0.1)
Basophils Relative: 0 % (ref 0–1)
Eosinophils Absolute: 0.1 10*3/uL (ref 0.0–0.7)
Eosinophils Relative: 3 % (ref 0–5)
HEMATOCRIT: 35.1 % — AB (ref 36.0–46.0)
HEMOGLOBIN: 11.7 g/dL — AB (ref 12.0–15.0)
LYMPHS ABS: 1.7 10*3/uL (ref 0.7–4.0)
LYMPHS PCT: 35 % (ref 12–46)
MCH: 29.8 pg (ref 26.0–34.0)
MCHC: 33.3 g/dL (ref 30.0–36.0)
MCV: 89.3 fL (ref 78.0–100.0)
MONO ABS: 0.3 10*3/uL (ref 0.1–1.0)
MONOS PCT: 6 % (ref 3–12)
NEUTROS ABS: 2.8 10*3/uL (ref 1.7–7.7)
NEUTROS PCT: 56 % (ref 43–77)
Platelets: 167 10*3/uL (ref 150–400)
RBC: 3.93 MIL/uL (ref 3.87–5.11)
RDW: 12.8 % (ref 11.5–15.5)
WBC: 4.9 10*3/uL (ref 4.0–10.5)

## 2015-05-18 LAB — URINALYSIS, ROUTINE W REFLEX MICROSCOPIC
BILIRUBIN URINE: NEGATIVE
Glucose, UA: NEGATIVE mg/dL
Hgb urine dipstick: NEGATIVE
Ketones, ur: NEGATIVE mg/dL
Leukocytes, UA: NEGATIVE
Nitrite: NEGATIVE
PH: 5.5 (ref 5.0–8.0)
Protein, ur: NEGATIVE mg/dL
SPECIFIC GRAVITY, URINE: 1.026 (ref 1.005–1.030)
Urobilinogen, UA: 1 mg/dL (ref 0.0–1.0)

## 2015-05-18 LAB — COMPREHENSIVE METABOLIC PANEL
ALT: 15 U/L (ref 14–54)
ANION GAP: 4 — AB (ref 5–15)
AST: 15 U/L (ref 15–41)
Albumin: 3.9 g/dL (ref 3.5–5.0)
Alkaline Phosphatase: 54 U/L (ref 38–126)
BILIRUBIN TOTAL: 0.7 mg/dL (ref 0.3–1.2)
BUN: 19 mg/dL (ref 6–20)
CO2: 26 mmol/L (ref 22–32)
Calcium: 8.5 mg/dL — ABNORMAL LOW (ref 8.9–10.3)
Chloride: 107 mmol/L (ref 101–111)
Creatinine, Ser: 0.67 mg/dL (ref 0.44–1.00)
GFR calc Af Amer: >60 mL/min (ref >60)
Glucose, Bld: 92 mg/dL (ref 65–99)
Potassium: 3.8 mmol/L (ref 3.5–5.1)
Sodium: 137 mmol/L (ref 135–145)
TOTAL PROTEIN: 6.6 g/dL (ref 6.5–8.1)

## 2015-05-18 LAB — ABO/RH: ABO/RH(D): A NEG

## 2015-05-18 LAB — PREGNANCY, URINE: PREG TEST UR: NEGATIVE

## 2015-05-20 MED ORDER — GENTAMICIN SULFATE 40 MG/ML IJ SOLN
5.0000 mg/kg | INTRAVENOUS | Status: AC
  Administered 2015-05-21: 450 mg via INTRAVENOUS
  Filled 2015-05-20: qty 11.25

## 2015-05-21 ENCOUNTER — Ambulatory Visit (HOSPITAL_COMMUNITY): Payer: BLUE CROSS/BLUE SHIELD | Admitting: Certified Registered Nurse Anesthetist

## 2015-05-21 ENCOUNTER — Encounter (HOSPITAL_COMMUNITY): Payer: Self-pay | Admitting: *Deleted

## 2015-05-21 ENCOUNTER — Ambulatory Visit (HOSPITAL_COMMUNITY)
Admission: RE | Admit: 2015-05-21 | Discharge: 2015-05-22 | Disposition: A | Discharge status: Home / Self Care | Payer: BLUE CROSS/BLUE SHIELD | Source: Ambulatory Visit | Attending: Gynecologic Oncology | Admitting: Gynecologic Oncology

## 2015-05-21 ENCOUNTER — Encounter (HOSPITAL_COMMUNITY)
Admission: RE | Disposition: A | Discharge status: Home / Self Care | Payer: Self-pay | Source: Ambulatory Visit | Attending: Gynecologic Oncology

## 2015-05-21 DIAGNOSIS — D259 Leiomyoma of uterus, unspecified: Secondary | ICD-10-CM | POA: Insufficient documentation

## 2015-05-21 DIAGNOSIS — N809 Endometriosis, unspecified: Secondary | ICD-10-CM | POA: Diagnosis not present

## 2015-05-21 DIAGNOSIS — N939 Abnormal uterine and vaginal bleeding, unspecified: Secondary | ICD-10-CM

## 2015-05-21 DIAGNOSIS — Z791 Long term (current) use of non-steroidal anti-inflammatories (NSAID): Secondary | ICD-10-CM | POA: Insufficient documentation

## 2015-05-21 DIAGNOSIS — N801 Endometriosis of ovary: Secondary | ICD-10-CM | POA: Insufficient documentation

## 2015-05-21 DIAGNOSIS — N831 Corpus luteum cyst: Secondary | ICD-10-CM | POA: Insufficient documentation

## 2015-05-21 DIAGNOSIS — N830 Follicular cyst of ovary: Secondary | ICD-10-CM | POA: Insufficient documentation

## 2015-05-21 DIAGNOSIS — N800 Endometriosis of uterus: Secondary | ICD-10-CM | POA: Insufficient documentation

## 2015-05-21 DIAGNOSIS — N802 Endometriosis of fallopian tube: Secondary | ICD-10-CM | POA: Insufficient documentation

## 2015-05-21 DIAGNOSIS — Z79899 Other long term (current) drug therapy: Secondary | ICD-10-CM | POA: Insufficient documentation

## 2015-05-21 DIAGNOSIS — N80109 Endometriosis of ovary, unspecified side, unspecified depth: Secondary | ICD-10-CM

## 2015-05-21 HISTORY — PX: ROBOTIC ASSISTED TOTAL HYSTERECTOMY WITH BILATERAL SALPINGO OOPHERECTOMY: SHX6086

## 2015-05-21 LAB — TYPE AND SCREEN
ABO/RH(D): A NEG
ANTIBODY SCREEN: NEGATIVE

## 2015-05-21 SURGERY — HYSTERECTOMY, TOTAL, ROBOT-ASSISTED, LAPAROSCOPIC, WITH BILATERAL SALPINGO-OOPHORECTOMY
Anesthesia: General | Laterality: Bilateral

## 2015-05-21 MED ORDER — ROCURONIUM BROMIDE 100 MG/10ML IV SOLN
INTRAVENOUS | Status: AC
  Filled 2015-05-21: qty 1

## 2015-05-21 MED ORDER — GLYCOPYRROLATE 0.2 MG/ML IJ SOLN
INTRAMUSCULAR | Status: AC
  Filled 2015-05-21: qty 3

## 2015-05-21 MED ORDER — CLINDAMYCIN PHOSPHATE 900 MG/50ML IV SOLN
INTRAVENOUS | Status: AC
  Filled 2015-05-21: qty 50

## 2015-05-21 MED ORDER — FENTANYL CITRATE (PF) 100 MCG/2ML IJ SOLN
25.0000 ug | INTRAMUSCULAR | Status: DC | PRN
  Administered 2015-05-21 (×3): 50 ug via INTRAVENOUS

## 2015-05-21 MED ORDER — MIDAZOLAM HCL 2 MG/2ML IJ SOLN
INTRAMUSCULAR | Status: AC
  Filled 2015-05-21: qty 4

## 2015-05-21 MED ORDER — FENTANYL CITRATE (PF) 250 MCG/5ML IJ SOLN
INTRAMUSCULAR | Status: AC
  Filled 2015-05-21: qty 25

## 2015-05-21 MED ORDER — ONDANSETRON HCL 4 MG PO TABS: 4.0000 mg | ORAL_TABLET | Freq: Four times a day (QID) | ORAL | Status: DC | PRN

## 2015-05-21 MED ORDER — FENTANYL CITRATE (PF) 100 MCG/2ML IJ SOLN
INTRAMUSCULAR | Status: DC | PRN
  Administered 2015-05-21: 100 ug via INTRAVENOUS
  Administered 2015-05-21: 50 ug via INTRAVENOUS

## 2015-05-21 MED ORDER — KETOROLAC TROMETHAMINE 30 MG/ML IJ SOLN
30.0000 mg | Freq: Four times a day (QID) | INTRAMUSCULAR | Status: AC | PRN
  Administered 2015-05-21 – 2015-05-22 (×3): 30 mg via INTRAVENOUS
  Filled 2015-05-21 (×2): qty 1

## 2015-05-21 MED ORDER — OXYCODONE-ACETAMINOPHEN 5-325 MG PO TABS
1.0000 | ORAL_TABLET | ORAL | Status: DC | PRN
  Administered 2015-05-21 – 2015-05-22 (×5): 2 via ORAL
  Filled 2015-05-21 (×6): qty 2

## 2015-05-21 MED ORDER — KCL IN DEXTROSE-NACL 20-5-0.45 MEQ/L-%-% IV SOLN
INTRAVENOUS | Status: DC
  Administered 2015-05-21 (×2): via INTRAVENOUS
  Filled 2015-05-21 (×4): qty 1000

## 2015-05-21 MED ORDER — ROCURONIUM BROMIDE 100 MG/10ML IV SOLN
INTRAVENOUS | Status: DC | PRN
  Administered 2015-05-21: 30 mg via INTRAVENOUS
  Administered 2015-05-21: 10 mg via INTRAVENOUS

## 2015-05-21 MED ORDER — EPHEDRINE SULFATE 50 MG/ML IJ SOLN
INTRAMUSCULAR | Status: DC | PRN
  Administered 2015-05-21 (×2): 10 mg via INTRAVENOUS

## 2015-05-21 MED ORDER — LACTATED RINGERS IV SOLN: INTRAVENOUS | Status: DC

## 2015-05-21 MED ORDER — FENTANYL CITRATE (PF) 100 MCG/2ML IJ SOLN
INTRAMUSCULAR | Status: AC
  Filled 2015-05-21: qty 2

## 2015-05-21 MED ORDER — IBUPROFEN 800 MG PO TABS
800.0000 mg | ORAL_TABLET | Freq: Three times a day (TID) | ORAL | Status: DC | PRN
  Administered 2015-05-22: 800 mg via ORAL
  Filled 2015-05-21: qty 1

## 2015-05-21 MED ORDER — ONDANSETRON HCL 4 MG/2ML IJ SOLN
4.0000 mg | Freq: Four times a day (QID) | INTRAMUSCULAR | Status: DC | PRN
  Administered 2015-05-21: 4 mg via INTRAVENOUS
  Filled 2015-05-21: qty 2

## 2015-05-21 MED ORDER — STERILE WATER FOR IRRIGATION IR SOLN
Status: DC | PRN
  Administered 2015-05-21: 1000 mL

## 2015-05-21 MED ORDER — PROPOFOL 10 MG/ML IV BOLUS
INTRAVENOUS | Status: AC
  Filled 2015-05-21: qty 20

## 2015-05-21 MED ORDER — SUCCINYLCHOLINE CHLORIDE 20 MG/ML IJ SOLN
INTRAMUSCULAR | Status: DC | PRN
  Administered 2015-05-21: 100 mg via INTRAVENOUS

## 2015-05-21 MED ORDER — ONDANSETRON HCL 4 MG/2ML IJ SOLN
INTRAMUSCULAR | Status: AC
  Filled 2015-05-21: qty 2

## 2015-05-21 MED ORDER — NEOSTIGMINE METHYLSULFATE 10 MG/10ML IV SOLN
INTRAVENOUS | Status: DC | PRN
  Administered 2015-05-21: 4 mg via INTRAVENOUS

## 2015-05-21 MED ORDER — ONDANSETRON HCL 4 MG/2ML IJ SOLN
INTRAMUSCULAR | Status: DC | PRN
  Administered 2015-05-21: 4 mg via INTRAVENOUS

## 2015-05-21 MED ORDER — LACTATED RINGERS IR SOLN
Status: DC | PRN
  Administered 2015-05-21: 1000 mL

## 2015-05-21 MED ORDER — PROMETHAZINE HCL 25 MG/ML IJ SOLN
INTRAMUSCULAR | Status: AC
  Filled 2015-05-21: qty 1

## 2015-05-21 MED ORDER — MEPERIDINE HCL 50 MG/ML IJ SOLN
6.2500 mg | INTRAMUSCULAR | Status: DC | PRN
  Administered 2015-05-21: 6.25 mg via INTRAVENOUS
  Administered 2015-05-21: 12.5 mg via INTRAVENOUS

## 2015-05-21 MED ORDER — TRAZODONE HCL 50 MG PO TABS: 50.0000 mg | ORAL_TABLET | Freq: Every evening | ORAL | Status: DC | PRN

## 2015-05-21 MED ORDER — LIDOCAINE HCL (CARDIAC) 20 MG/ML IV SOLN
INTRAVENOUS | Status: DC | PRN
  Administered 2015-05-21: 50 mg via INTRAVENOUS

## 2015-05-21 MED ORDER — NEOSTIGMINE METHYLSULFATE 10 MG/10ML IV SOLN
INTRAVENOUS | Status: AC
  Filled 2015-05-21: qty 1

## 2015-05-21 MED ORDER — TAMSULOSIN HCL 0.4 MG PO CAPS
0.4000 mg | ORAL_CAPSULE | Freq: Every day | ORAL | Status: DC
  Administered 2015-05-21: 0.4 mg via ORAL
  Filled 2015-05-21 (×2): qty 1

## 2015-05-21 MED ORDER — PROPOFOL 10 MG/ML IV BOLUS
INTRAVENOUS | Status: DC | PRN
  Administered 2015-05-21: 150 mg via INTRAVENOUS

## 2015-05-21 MED ORDER — CLINDAMYCIN PHOSPHATE 900 MG/50ML IV SOLN
900.0000 mg | INTRAVENOUS | Status: AC
  Administered 2015-05-21: 900 mg via INTRAVENOUS

## 2015-05-21 MED ORDER — MEPERIDINE HCL 50 MG/ML IJ SOLN
INTRAMUSCULAR | Status: AC
  Filled 2015-05-21: qty 1

## 2015-05-21 MED ORDER — PROMETHAZINE HCL 25 MG/ML IJ SOLN
6.2500 mg | INTRAMUSCULAR | Status: DC | PRN
  Administered 2015-05-21: 6.25 mg via INTRAVENOUS

## 2015-05-21 MED ORDER — GLYCOPYRROLATE 0.2 MG/ML IJ SOLN
INTRAMUSCULAR | Status: DC | PRN
  Administered 2015-05-21: .6 mg via INTRAVENOUS

## 2015-05-21 MED ORDER — LIDOCAINE HCL (CARDIAC) 20 MG/ML IV SOLN
INTRAVENOUS | Status: AC
  Filled 2015-05-21: qty 5

## 2015-05-21 MED ORDER — HYDROMORPHONE HCL 1 MG/ML IJ SOLN
0.2000 mg | INTRAMUSCULAR | Status: DC | PRN
  Administered 2015-05-21 – 2015-05-22 (×5): 0.5 mg via INTRAVENOUS
  Administered 2015-05-22 (×2): 0.6 mg via INTRAVENOUS
  Filled 2015-05-21 (×7): qty 1

## 2015-05-21 MED ORDER — LACTATED RINGERS IV SOLN
INTRAVENOUS | Status: DC | PRN
Start: 2015-05-21 — End: 2015-05-21
  Administered 2015-05-21 (×2): via INTRAVENOUS

## 2015-05-21 MED ORDER — MIDAZOLAM HCL 5 MG/5ML IJ SOLN
INTRAMUSCULAR | Status: DC | PRN
  Administered 2015-05-21: 2 mg via INTRAVENOUS

## 2015-05-21 MED ORDER — KETOROLAC TROMETHAMINE 30 MG/ML IJ SOLN
INTRAMUSCULAR | Status: AC
  Filled 2015-05-21: qty 1

## 2015-05-21 SURGICAL SUPPLY — 51 items
ADH SKN CLS APL DERMABOND .7 (GAUZE/BANDAGES/DRESSINGS) ×1
BAG SPEC RTRVL LRG 6X4 10 (ENDOMECHANICALS)
CHLORAPREP W/TINT 26ML (MISCELLANEOUS) ×2 IMPLANT
COVER SURGICAL LIGHT HANDLE (MISCELLANEOUS) IMPLANT
COVER TIP SHEARS 8 DVNC (MISCELLANEOUS) ×1 IMPLANT
COVER TIP SHEARS 8MM DA VINCI (MISCELLANEOUS) ×1
DERMABOND ADVANCED (GAUZE/BANDAGES/DRESSINGS) ×1
DERMABOND ADVANCED .7 DNX12 (GAUZE/BANDAGES/DRESSINGS) ×1 IMPLANT
DRAPE ARM DVNC X/XI (DISPOSABLE) ×4 IMPLANT
DRAPE COLUMN DVNC XI (DISPOSABLE) ×1 IMPLANT
DRAPE DA VINCI XI ARM (DISPOSABLE) ×4
DRAPE DA VINCI XI COLUMN (DISPOSABLE) ×1
DRAPE SHEET LG 3/4 BI-LAMINATE (DRAPES) ×4 IMPLANT
DRAPE SURG IRRIG POUCH 19X23 (DRAPES) ×2 IMPLANT
DRAPE TABLE BACK 44X90 PK DISP (DRAPES) IMPLANT
DRAPE WARM FLUID 44X44 (DRAPE) ×2 IMPLANT
ELECT REM PT RETURN 9FT ADLT (ELECTROSURGICAL) ×2
ELECTRODE REM PT RTRN 9FT ADLT (ELECTROSURGICAL) ×1 IMPLANT
GLOVE BIO SURGEON STRL SZ 6 (GLOVE) ×6 IMPLANT
GLOVE BIO SURGEON STRL SZ 6.5 (GLOVE) ×8 IMPLANT
GOWN STRL REUS W/ TWL LRG LVL3 (GOWN DISPOSABLE) ×2 IMPLANT
GOWN STRL REUS W/TWL LRG LVL3 (GOWN DISPOSABLE) ×4
HOLDER FOLEY CATH W/STRAP (MISCELLANEOUS) ×2 IMPLANT
KIT BASIN OR (CUSTOM PROCEDURE TRAY) ×2 IMPLANT
LIQUID BAND (GAUZE/BANDAGES/DRESSINGS) ×2 IMPLANT
MANIPULATOR UTERINE 4.5 ZUMI (MISCELLANEOUS) ×2 IMPLANT
OCCLUDER COLPOPNEUMO (BALLOONS) ×2 IMPLANT
PEN SKIN MARKING BROAD (MISCELLANEOUS) ×2 IMPLANT
PORT ACCESS TROCAR AIRSEAL 12 (TROCAR) ×1 IMPLANT
PORT ACCESS TROCAR AIRSEAL 5M (TROCAR) ×1
POUCH SPECIMEN RETRIEVAL 10MM (ENDOMECHANICALS) IMPLANT
SEAL CANN UNIV 5-8 DVNC XI (MISCELLANEOUS) ×4 IMPLANT
SEAL XI 5MM-8MM UNIVERSAL (MISCELLANEOUS) ×4
SET IRRIG TUBING LAPAROSCOPIC (IRRIGATION / IRRIGATOR) ×2 IMPLANT
SET TRI-LUMEN FLTR TB AIRSEAL (TUBING) ×2 IMPLANT
SET TUBE IRRIG SUCTION NO TIP (IRRIGATION / IRRIGATOR) IMPLANT
SHEET LAVH (DRAPES) ×2 IMPLANT
SOLUTION ELECTROLUBE (MISCELLANEOUS) ×2 IMPLANT
SUT VIC AB 0 CT1 27 (SUTURE) ×2
SUT VIC AB 0 CT1 27XBRD ANTBC (SUTURE) ×1 IMPLANT
SUT VIC AB 4-0 PS2 27 (SUTURE) ×4 IMPLANT
SYR 50ML LL SCALE MARK (SYRINGE) ×2 IMPLANT
TOWEL OR 17X26 10 PK STRL BLUE (TOWEL DISPOSABLE) ×2 IMPLANT
TOWEL OR NON WOVEN STRL DISP B (DISPOSABLE) ×2 IMPLANT
TRAP SPECIMEN MUCOUS 40CC (MISCELLANEOUS) IMPLANT
TRAY FOLEY W/METER SILVER 14FR (SET/KITS/TRAYS/PACK) ×2 IMPLANT
TRAY FOLEY W/METER SILVER 16FR (SET/KITS/TRAYS/PACK) IMPLANT
TRAY LAPAROSCOPIC (CUSTOM PROCEDURE TRAY) ×2 IMPLANT
TROCAR BLADELESS OPT 5 100 (ENDOMECHANICALS) ×2 IMPLANT
TROCAR XCEL 12X100 BLDLESS (ENDOMECHANICALS) IMPLANT
WATER STERILE IRR 1500ML POUR (IV SOLUTION) IMPLANT

## 2015-05-21 NOTE — H&P (Signed)
Consult was requested by Dr. Nelda Marseille for the evaluation of Pavneet Markwood 39 y.o. female with endometriosis and pelvic pain  CC:  Chief Complaint  Patient presents with  . Abnormal Pap Smear  . Endometriosis    Assessment/Plan:  Ms. Trisha Ken is a 39 y.o. year old with abnormal uterine bleeding, endometriosis, chronic pelvic pain who has failed medical management with OCP's and IUD's and desires definitive surgical management with a hysterectomy.  I discussed with Gerald that in order for surgery to resolve both her symptoms of bleeding and pain she would require hysterectomy with BSO because endometriosis in the pelvis would continue to be stimulated by residual ovarian tissue if a hysterectomy alone was performed, and therefore her symptoms would not be alleviated. Discussed that oophorectomy in premenopausal women in the absence of malignancy is associated with an earlier onset of course mortality. I discussed that we may be able to mitigate some of this with supplying the hormone replacement therapy (estrogenic only preparation) until age of natural menopause. This has not been associated with progression of endometriosis, or increased risk for breast cancer.  I discussed that in patients with documented severe pelvic adhesive disease there is substantial increased operative risks. These risks particular pertained to operative injury of visceral organs such as bowel, bladder, or ureters. I discussed that such injuries may be occult and only recognized postoperatively and require readmission, reoperation, and substantial postsurgical morbidity. I discussed that alternatives to therapy that the patient could consider would be pain management, and additional trials at medical therapy. However I agree with the patient that she has had an adequate trial of conservative medical therapies in the past and that surgical intervention is reasonable to entertain at this time if she feels  comfortable accepting the unavoidable surgical risks.  I also explained that her pelvic pain symptoms may not be cured by surgery, particularly if she has established central defects in pain perception or if her pain is caused by a non-gynecologic etiology. I also discussed that surgery can invoke injury that is new and a new source for ongoing pain.  I think she would be a good candidate for a minimally invasive approach with robotic assisted total hysterectomy with BSO. I discussed that conversion to laparotomy may be necessary if completion Via is minimally invasive route is not felt to be safe. I discussed with the patient anticipated postoperative recovery (overnight stay) and restrictions postoperatively. I discussed operative risk including bleeding, infection, damage to internal organs (such as bladder,ureters, bowels), blood clot, reoperation and rehospitalization.   HPI: Tamre Cass is a very pleasant 39 year old woman who is seen in consultation at the request of Dr Nelda Marseille for a 20 year history of chronic pelvic pain, and abnormal uterine bleeding. She used oral contraception of pills for approximately 10 years in her teens and early 28s for manner menorrhagia. This failed to ameliorate her symptoms. She went on to attempt an intrauterine device for control symptoms however this became embedded into the uterine wall requiring retrieval (in the office). She again trial oral contraception of pills more recently for 3-4 month. However this did not control her symptoms. She underwent an endometrial ablation in 2004 however postoperatively continued to have irregular vaginal spotting and occasional bright red bleeding. She was being prepared for a hysterectomy when she took a preoperative pregnancy test which revealed an unexpected pregnancy. She has a history for this of primary infertility felt to be secondary to endometriosis. She also had a history of  protestant insufficiency and pregnancy had  experienced 6 spontaneous miscarriages. However she was successful in carrying this unexpected pregnancy to term.  Since delivery of her son she's continued to have chronic pelvic pain. It is unclear if this cyclic or constant. She takes Aleve for the pain however this that she is minimal release. She has bleeding most days of the month but his spotting and occasional bright red bleeding more consistent with a period. He strongly desires definitive surgical intervention.  She has a remote history of abnormal Pap smears with a history of cryotherapy and possibly a LEEP procedure. In July 2016 she had an ASCUS Pap that was HPV negative.  She has had for laparoscopies in the past for pelvic pain and infertility. She was told by her gynecologist at the time that should severe pelvic adhesive disease. She did not have resection of any gynecologic tissue during these surgeries per patient. She is no significant family history for breast or ovarian cancer. Her father had liver cancer, maternal grandfather had lung cancer, a paternal grandfather had melanoma.\  Current Meds:  Outpatient Encounter Prescriptions as of 04/13/2015  Medication Sig  . citalopram (CELEXA) 20 MG tablet Take 20 mg by mouth at bedtime.  . clonazePAM (KLONOPIN) 0.5 MG tablet Take 0.5 mg by mouth 2 (two) times daily as needed for anxiety.  . diclofenac (VOLTAREN) 75 MG EC tablet Take 75 mg by mouth 2 (two) times daily.  Marland Kitchen docusate sodium (COLACE) 100 MG capsule Take 100 mg by mouth daily.  . ondansetron (ZOFRAN ODT) 8 MG disintegrating tablet Take 1 tablet (8 mg total) by mouth every 8 (eight) hours as needed for nausea or vomiting.  . phenazopyridine (PYRIDIUM) 100 MG tablet Take 1 tablet (100 mg total) by mouth 3 (three) times daily with meals.  . sucralfate (CARAFATE) 1 G tablet Take 1 tablet (1 g total) by mouth 4 (four) times daily.  . traZODone (DESYREL) 50 MG tablet Take 100 mg by mouth at bedtime.    Marland Kitchen HYDROcodone-acetaminophen (NORCO/VICODIN) 5-325 MG per tablet Take 1-2 tablets by mouth every 4 (four) hours as needed for moderate pain or severe pain. (Patient not taking: Reported on 04/13/2015)  . oxyCODONE-acetaminophen (PERCOCET) 7.5-325 MG per tablet Take 1 tablet by mouth every 4 (four) hours as needed for severe pain. (Patient not taking: Reported on 04/13/2015)  . tamsulosin (FLOMAX) 0.4 MG CAPS capsule Take 1 capsule (0.4 mg total) by mouth daily after supper. (Patient not taking: Reported on 04/13/2015)  . [DISCONTINUED] levofloxacin (LEVAQUIN) 500 MG tablet Take 1 tablet (500 mg total) by mouth at bedtime. (Patient not taking: Reported on 02/24/2015)  . [DISCONTINUED] predniSONE (DELTASONE) 20 MG tablet Take 2 tablets (40 mg total) by mouth daily. (Patient not taking: Reported on 04/13/2015)   No facility-administered encounter medications on file as of 04/13/2015.    Allergy:  Allergies  Allergen Reactions  . Amoxicillin     Rash all over [place]  . Ciprofloxacin Diarrhea and Nausea Only  . Penicillins Rash    Social Hx:  History   Social History  . Marital Status: Married    Spouse Name: N/A  . Number of Children: N/A  . Years of Education: N/A   Occupational History  . Not on file.   Social History Main Topics  . Smoking status: Never Smoker   . Smokeless tobacco: Never Used  . Alcohol Use: No  . Drug Use: No  . Sexual Activity: Yes   Other Topics Concern  .  Not on file   Social History Narrative    Past Surgical Hx:  Past Surgical History  Procedure Laterality Date  . Ablation      uterine  . Dilation and curettage of uterus    . Cystoscopy w/ ureteral stent placement Left 02/02/2015    Procedure: CYSTOSCOPY WITH RETROGRADE PYELOGRAM/URETERAL STENT PLACEMENT; Surgeon: Festus Aloe, MD; Location: [facility]; Service: Urology; Laterality: Left;   . Laparoscopy      x 4  . Cystoscopy with ureteroscopy and stent placement Left 02/13/2015    Procedure: LEFT URETEROSCOPY WITH HOLMIUM LASER AND STENT PLACEMENT; Surgeon: Festus Aloe, MD; Location: [facility]; Service: Urology; Laterality: Left;  . Holmium laser application N/A 0/07/2724    Procedure: HOLMIUM LASER APPLICATION; Surgeon: Festus Aloe, MD; Location: [facility]; Service: Urology; Laterality: N/A;  . Melanoma excision  2000    melanoma removed from face, basal cell removed from nose    Past Medical Hx:  Past Medical History  Diagnosis Date  . Endometriosis   . Migraines   . History of kidney stones   . Pleurisy 02/2015    while hospitalized   . Fibroids     age 69  . Abnormal Pap smear of cervix 12/2014    Past Gynecological History: + abnrmal paps in past, + laparoscopies for inferiltity, endometriosis. SVD x 1. No LMP recorded.  Family Hx:  Family History  Problem Relation Age of Onset  . Hypertension Mother   . Cancer - Other Father     liver  . Diabetes Mellitus II Maternal Grandmother   . Heart disease Maternal Grandmother   . Cancer - Lung Maternal Grandfather   . Osteoporosis Paternal Grandmother   . Melanoma Paternal Grandfather     Review of Systems:  Constitutional  Feels well,  ENT Normal appearing ears and nares bilaterally Skin/Breast  No rash, sores, jaundice, itching, dryness Cardiovascular  No chest pain, shortness of breath, or edema  Pulmonary  No cough or wheeze.  Gastro Intestinal  No nausea, vomitting, or diarrhoea. No bright red blood per rectum, no abdominal pain, change in bowel movement, or constipation.  Genito Urinary  No frequency, urgency, dysuria, see HPI Musculo Skeletal  No myalgia, arthralgia, joint swelling or pain  Neurologic  No weakness, numbness, change in gait,  Psychology  No depression,  anxiety, insomnia.   Vitals: Blood pressure 106/66, pulse 66, temperature 98.5 F (36.9 C), temperature source Oral, resp. rate 18, height 6' (1.829 m), weight 199 lb 4.8 oz (90.402 kg), SpO2 100 %.  Physical Exam: WD in NAD Neck  Supple NROM, without any enlargements.  Lymph Node Survey No cervical supraclavicular or inguinal adenopathy Cardiovascular  Pulse normal rate, regularity and rhythm. S1 and S2 normal.  Lungs  Clear to auscultation bilateraly, without wheezes/crackles/rhonchi. Good air movement.  Skin  No rash/lesions/breakdown  Psychiatry  Alert and oriented to person, place, and time  Abdomen  Normoactive bowel sounds, abdomen soft, non-tender and overweight without evidence of hernia.  Back No CVA tenderness Genito Urinary  Vulva/vagina: Normal external female genitalia. No lesions. No discharge or bleeding. Bladder/urethra: No lesions or masses, well supported bladder Vagina: normal Cervix: Normal appearing, no lesions. Uterus: Small, mobile, no parametrial involvement or nodularity. Adnexa: no palpable masses. Rectal  Good tone, no masses no cul de sac nodularity.  Extremities  No bilateral cyanosis, clubbing or edema.   Donaciano Eva, MD

## 2015-05-21 NOTE — Anesthesia Preprocedure Evaluation (Addendum)
Anesthesia Evaluation  Patient identified by MRN, date of birth, ID band Patient awake    Reviewed: Allergy & Precautions, NPO status , Patient's Chart, lab work & pertinent test results  Airway Mallampati: II  TM Distance: >3 FB Neck ROM: Full    Dental no notable dental hx.    Pulmonary neg pulmonary ROS,  breath sounds clear to auscultation  Pulmonary exam normal       Cardiovascular negative cardio ROS Normal cardiovascular examRhythm:Regular Rate:Normal     Neuro/Psych negative neurological ROS  negative psych ROS   GI/Hepatic negative GI ROS, Neg liver ROS,   Endo/Other  negative endocrine ROS  Renal/GU negative Renal ROS  negative genitourinary   Musculoskeletal negative musculoskeletal ROS (+)   Abdominal   Peds negative pediatric ROS (+)  Hematology negative hematology ROS (+)   Anesthesia Other Findings   Reproductive/Obstetrics negative OB ROS                             Anesthesia Physical Anesthesia Plan  ASA: II  Anesthesia Plan: General   Post-op Pain Management:    Induction: Intravenous  Airway Management Planned: Oral ETT  Additional Equipment:   Intra-op Plan:   Post-operative Plan: Extubation in OR  Informed Consent: I have reviewed the patients History and Physical, chart, labs and discussed the procedure including the risks, benefits and alternatives for the proposed anesthesia with the patient or authorized representative who has indicated his/her understanding and acceptance.   Dental advisory given  Plan Discussed with: CRNA  Anesthesia Plan Comments:         Anesthesia Quick Evaluation

## 2015-05-21 NOTE — Transfer of Care (Signed)
Immediate Anesthesia Transfer of Care Note  Patient: Denise Avila  Procedure(s) Performed: Procedure(s): XI ROBOTIC ASSISTED TOTAL HYSTERECTOMY WITH BILATERAL SALPINGO OOPHORECTOMY (Bilateral)  Patient Location: PACU  Anesthesia Type:General  Level of Consciousness: awake and alert   Airway & Oxygen Therapy: Patient Spontanous Breathing and Patient connected to face mask oxygen  Post-op Assessment: Report given to RN and Post -op Vital signs reviewed and stable  Post vital signs: Reviewed and stable  Last Vitals:  Filed Vitals:   05/21/15 0921  BP:   Pulse: 55  Temp:   Resp:     Complications: No apparent anesthesia complications

## 2015-05-21 NOTE — Anesthesia Procedure Notes (Signed)
Procedure Name: Intubation Date/Time: 05/21/2015 7:36 AM Performed by: Dimas Millin, Berenise Hunton F Pre-anesthesia Checklist: Patient identified, Emergency Drugs available, Suction available, Patient being monitored and Timeout performed Patient Re-evaluated:Patient Re-evaluated prior to inductionOxygen Delivery Method: Circle system utilized Preoxygenation: Pre-oxygenation with 100% oxygen Intubation Type: IV induction Ventilation: Mask ventilation without difficulty Laryngoscope Size: Miller and 2 Grade View: Grade I Tube type: Oral Tube size: 7.0 mm Number of attempts: 1 Airway Equipment and Method: Stylet Placement Confirmation: ETT inserted through vocal cords under direct vision,  positive ETCO2 and breath sounds checked- equal and bilateral Secured at: 22 cm Tube secured with: Tape Dental Injury: Teeth and Oropharynx as per pre-operative assessment  Comments: Endoguard used for protection of teeth.

## 2015-05-21 NOTE — Op Note (Signed)
OPERATIVE NOTE  Surgeon: Donaciano Eva   Assistants: Dr Delsa Sale (an MD assistant was necessary for tissue manipulation, management of robotic instrumentation, retraction and positioning due to the complexity of the case and hospital policies).   Anesthesia: General endotracheal anesthesia  ASA Class: 2   Pre-operative Diagnosis: abnormal uterine bleeding and pelvic pain  Post-operative Diagnosis: endometriosis  Operation: Robotic-assisted laparoscopic hysterectomy with bilateral salpingoophorectomy   Surgeon: Donaciano Eva  Assistant Surgeon: Lahoma Crocker MD  Anesthesia: GET  Urine Output: 300cc  Operative Findings:  : normal appearing uterus, powderburn endometriosis lesion on right ovary. No pelvic adhesive disease.  Estimated Blood Loss:  less than 50 mL      Total IV Fluids: 500 ml         Specimens: uterus, cervix, bilateral tubes and ovaries         Complications:  None; patient tolerated the procedure well.         Disposition: PACU - hemodynamically stable.  Procedure Details  The patient was seen in the Holding Room. The risks, benefits, complications, treatment options, and expected outcomes were discussed with the patient.  The patient concurred with the proposed plan, giving informed consent.  The site of surgery properly noted/marked. The patient was identified as Denise Avila and the procedure verified as a Robotic-assisted hysterectomy with bilateral salpingo oophorectomy. A Time Out was held and the above information confirmed.  After induction of anesthesia, the patient was draped and prepped in the usual sterile manner. Pt was placed in supine position after anesthesia and draped and prepped in the usual sterile manner. The abdominal drape was placed after the CholoraPrep had been allowed to dry for 3 minutes.  Her arms were tucked to her side with all appropriate precautions.  The shoulders were secured to the table with padded  shoulder blocks.  The patient was placed in the semi-lithotomy position in [place].  The perineum was prepped with Betadine.  Foley catheter was placed.  A sterile speculum was placed in the vagina.  The cervix was grasped with a single-tooth tenaculum and dilated with Kennon Rounds dilators.  The ZUMI uterine manipulator with a medium colpotomizer ring was placed without difficulty.  A pneum occluder balloon was placed over the manipulator.  A second time-out was performed.  OG tube placement was confirmed and to suction.    Procedure:  The patient was brought to the operating room where general anesthesia was administered with no complications.  The patient was placed in the dorsal lithotomy position in padded Allen stirrups.  The arms were tucked at the sides with gel pads protecting the elbows and foam protecting the hands. The patient was then prepped.  A Foley was placed to gravity.  A medium size KOH ring was used to place around the cervix after the cervix had been dilated and then a RUMI manipulator was attached in the normal manner.  The patient was then draped in the normal manner.  Next, a 5 mm skin incision was made 1 cm below the subcostal margin in the midclavicular line.  The 5 mm Optiview port and scope was used for direct entry.  Opening pressure was under 10 mm CO2.  The abdomen was insufflated and the findings were noted as above.   At this point and all points during the procedure, the patient's intra-abdominal pressure did not exceed 15 mmHg. Next, an 69mm skin incision was made in the umbilicus and a right and left port was placed about  10 cm lateral to the robot port on the right and left side.  All ports were placed under direct visualization.  The patient was placed in steep Trendelenburg.  Bowel was away into the upper abdomen.  The robot was docked in the normal manner.  The hysterectomy was started after the round ligament on the right side was incised and the retroperitoneum was  entered and the pararectal space was developed.  The ureter was noted to be on the medial leaf of the broad ligament.  The peritoneum above the ureter was incised and stretched and the infundibulopelvic ligament was skeletonized, cauterized and cut.  The posterior peritoneum was taken down to the level of the KOH ring.  The anterior peritoneum was also taken down.  The bladder flap was created to the level of the KOH ring.  The uterine artery on the right side was skeletonized, cauterized and cut in the normal manner.  A similar procedure was performed on the left.  The colpotomy was made and the uterus, cervix, bilateral ovaries and tubes were amputated and delivered through the vagina.  Pedicles were inspected and excellent hemostasis was achieved.    The colpotomy at the vaginal cuff was closed with Vicryl on a CT1 needle in a running manner.  Irrigation was used and excellent hemostasis was achieved.  At this point in the procedure was completed.  Robotic instruments were removed under direct visulaization.  The robot was undocked. The 10 mm ports were closed with Vicryl on a UR-5 needle and the fascia was closed with 0 Vicryl on a UR-5 needle.  The skin was closed with 4-0 Vicryl in a subcuticular manner.  Dermabond was applied.  Sponge, lap and needle counts correct x 2.  The patient was taken to the recovery room in stable condition.  The vagina was swabbed with  minimal bleeding noted.   All instrument and needle counts were correct x  3.   The patient was transferred to the recovery room in a stable condition.  Donaciano Eva, MD

## 2015-05-21 NOTE — Anesthesia Postprocedure Evaluation (Signed)
  Anesthesia Post-op Note  Patient: Denise Avila  Procedure(s) Performed: Procedure(s) (LRB): XI ROBOTIC ASSISTED TOTAL HYSTERECTOMY WITH BILATERAL SALPINGO OOPHORECTOMY (Bilateral)  Patient Location: PACU  Anesthesia Type: General  Level of Consciousness: awake and alert   Airway and Oxygen Therapy: Patient Spontanous Breathing  Post-op Pain: mild  Post-op Assessment: Post-op Vital signs reviewed, Patient's Cardiovascular Status Stable, Respiratory Function Stable, Patent Airway and No signs of Nausea or vomiting  Last Vitals:  Filed Vitals:   05/21/15 1237  BP: 98/62  Pulse: 66  Temp: 36.7 C  Resp: 18    Post-op Vital Signs: stable   Complications: No apparent anesthesia complications

## 2015-05-22 ENCOUNTER — Encounter (HOSPITAL_COMMUNITY): Payer: Self-pay | Admitting: Gynecologic Oncology

## 2015-05-22 DIAGNOSIS — D259 Leiomyoma of uterus, unspecified: Secondary | ICD-10-CM | POA: Diagnosis not present

## 2015-05-22 LAB — BASIC METABOLIC PANEL
ANION GAP: 7 (ref 5–15)
BUN: 9 mg/dL (ref 6–20)
CO2: 25 mmol/L (ref 22–32)
Calcium: 8.3 mg/dL — ABNORMAL LOW (ref 8.9–10.3)
Chloride: 106 mmol/L (ref 101–111)
Creatinine, Ser: 0.64 mg/dL (ref 0.44–1.00)
GFR calc Af Amer: >60 mL/min (ref >60)
GLUCOSE: 105 mg/dL — AB (ref 65–99)
POTASSIUM: 3.9 mmol/L (ref 3.5–5.1)
Sodium: 138 mmol/L (ref 135–145)

## 2015-05-22 LAB — CBC
HEMATOCRIT: 33.9 % — AB (ref 36.0–46.0)
Hemoglobin: 11 g/dL — ABNORMAL LOW (ref 12.0–15.0)
MCH: 29 pg (ref 26.0–34.0)
MCHC: 32.4 g/dL (ref 30.0–36.0)
MCV: 89.4 fL (ref 78.0–100.0)
PLATELETS: 147 10*3/uL — AB (ref 150–400)
RBC: 3.79 MIL/uL — AB (ref 3.87–5.11)
RDW: 12.9 % (ref 11.5–15.5)
WBC: 6.9 10*3/uL (ref 4.0–10.5)

## 2015-05-22 MED ORDER — IBUPROFEN 800 MG PO TABS: 800.0000 mg | ORAL_TABLET | Freq: Three times a day (TID) | ORAL | Status: DC | PRN

## 2015-05-22 MED ORDER — ONDANSETRON HCL 4 MG PO TABS: 4.0000 mg | ORAL_TABLET | Freq: Four times a day (QID) | ORAL | Status: DC | PRN

## 2015-05-22 MED ORDER — DIPHENHYDRAMINE HCL 25 MG PO CAPS
25.0000 mg | ORAL_CAPSULE | Freq: Four times a day (QID) | ORAL | Status: DC | PRN
  Administered 2015-05-22: 25 mg via ORAL
  Filled 2015-05-22: qty 1

## 2015-05-22 MED ORDER — ESTROGENS CONJUGATED 0.45 MG PO TABS
0.4500 mg | ORAL_TABLET | Freq: Every day | ORAL | Status: DC
Start: 2015-05-22 — End: 2015-06-22

## 2015-05-22 MED ORDER — GABAPENTIN 300 MG PO CAPS: 300.0000 mg | ORAL_CAPSULE | Freq: Three times a day (TID) | ORAL | Status: DC

## 2015-05-22 MED ORDER — DIPHENHYDRAMINE HCL 25 MG PO CAPS: 25.0000 mg | ORAL_CAPSULE | Freq: Four times a day (QID) | ORAL | Status: DC | PRN

## 2015-05-22 MED ORDER — OXYCODONE-ACETAMINOPHEN 5-325 MG PO TABS: 1.0000 | ORAL_TABLET | ORAL | Status: DC | PRN

## 2015-05-22 NOTE — Progress Notes (Signed)
Discharge instructions and prescriptions given to patient.  Questions answered

## 2015-05-22 NOTE — Discharge Instructions (Signed)
05/21/2015  Return to work: 4 weeks  Pain Medication: Take an anti-inflammatory regularly, around the clock, for 2 weeks. You can take EITHER diclofenac, or ibuprofen or naproxen (xoltaren), but NOT ALL THREE drugs together.  Activity: 1. Be up and out of the bed during the day.  Take a nap if needed.  You may walk up steps but be careful and use the hand rail.  Stair climbing will tire you more than you think, you may need to stop part way and rest.   2. No lifting or straining for 6 weeks.  3. No driving for 1 weeks.  Do Not drive if you are taking narcotic pain medicine.  4. Shower daily.  Use soap and water on your incision and pat dry; don't rub.   5. No sexual activity and nothing in the vagina for 6 weeks.  Diet: 1. Low sodium Heart Healthy Diet is recommended.  2. It is safe to use a laxative if you have difficulty moving your bowels.   Wound Care: 1. Keep clean and dry.  Shower daily.  Reasons to call the Doctor:   Fever - Oral temperature greater than 100.4 degrees Fahrenheit  Foul-smelling vaginal discharge  Difficulty urinating  Nausea and vomiting  Increased pain at the site of the incision that is unrelieved with pain medicine.  Difficulty breathing with or without chest pain  New calf pain especially if only on one side  Sudden, continuing increased vaginal bleeding with or without clots.   Follow-up: 1. See Everitt Amber in 4 weeks.  Contacts: For questions or concerns you should contact:  Dr. Everitt Amber at [phone]  or at [place]

## 2015-05-22 NOTE — Discharge Summary (Signed)
Physician Discharge Summary  Patient ID: Denise Avila MRN: [id number] DOB/AGE: [birth year] 39 y.o.  Admit date: 05/21/2015 Discharge date: 05/22/2015  Admission Diagnoses: <principal problem not specified>  Discharge Diagnoses:  Active Problems:   Endometriosis of ovary   Abnormal uterine bleeding   Endometriosis   Discharged Condition: good  Hospital Course: patient was admitted for total robotic hysterectomy, BSO for endometriosis and pelvic pain on 05/21/15. Surgery was uncomplicated and uneventful. Postoperatively she experienced greater than expected pain with no localizing source. The patient has a history significant for chronic pain and narcotic use. She was meeting discharge criteria on POD 1.  Consults: None  Significant Diagnostic Studies: labs:  CBC    Component Value Date/Time   WBC 6.9 05/22/2015 0425   RBC 3.79* 05/22/2015 0425   HGB 11.0* 05/22/2015 0425   HCT 33.9* 05/22/2015 0425   PLT 147* 05/22/2015 0425   MCV 89.4 05/22/2015 0425   MCH 29.0 05/22/2015 0425   MCHC 32.4 05/22/2015 0425   RDW 12.9 05/22/2015 0425   LYMPHSABS 1.7 05/18/2015 1450   MONOABS 0.3 05/18/2015 1450   EOSABS 0.1 05/18/2015 1450   BASOSABS 0.0 05/18/2015 1450      Treatments: surgery: see above  Discharge Exam: Blood pressure 90/50, pulse 56, temperature 98.1 F (36.7 C), temperature source Oral, resp. rate 18, height 6' (1.829 m), weight 197 lb 15.6 oz (89.8 kg), SpO2 97 %. General appearance: alert GI: soft, non-tender; bowel sounds normal; no masses,  no organomegaly Incision/Wound: clean, dry and intact x 4  Disposition: 01-Home or Self Care  Discharge Instructions    (HEART FAILURE PATIENTS) Call MD:  Anytime you have any of the following symptoms: 1) 3 pound weight gain in 24 hours or 5 pounds in 1 week 2) shortness of breath, with or without a dry hacking cough 3) swelling in the hands, feet or stomach 4) if you have to sleep on extra pillows at night in order  to breathe.    Complete by:  As directed      Call MD for:  difficulty breathing, headache or visual disturbances    Complete by:  As directed      Call MD for:  extreme fatigue    Complete by:  As directed      Call MD for:  hives    Complete by:  As directed      Call MD for:  persistant dizziness or light-headedness    Complete by:  As directed      Call MD for:  persistant nausea and vomiting    Complete by:  As directed      Call MD for:  redness, tenderness, or signs of infection (pain, swelling, redness, odor or green/yellow discharge around incision site)    Complete by:  As directed      Call MD for:  severe uncontrolled pain    Complete by:  As directed      Call MD for:  temperature >100.4    Complete by:  As directed      Diet - low sodium heart healthy    Complete by:  As directed      Diet general    Complete by:  As directed      Driving Restrictions    Complete by:  As directed   No driving for 7 days or until off narcotic pain medication     Increase activity slowly    Complete by:  As directed  Remove dressing in 24 hours    Complete by:  As directed      Sexual Activity Restrictions    Complete by:  As directed   No intercourse for 6 weeks            Medication List    TAKE these medications        citalopram 20 MG tablet  Commonly known as:  CELEXA  Take 20 mg by mouth at bedtime.     clonazePAM 0.5 MG tablet  Commonly known as:  KLONOPIN  Take 0.5 mg by mouth 2 (two) times daily as needed for anxiety.     diclofenac 75 MG EC tablet  Commonly known as:  VOLTAREN  Take 75 mg by mouth 2 (two) times daily.     diphenhydrAMINE 25 mg capsule  Commonly known as:  BENADRYL  Take 1 capsule (25 mg total) by mouth every 6 (six) hours as needed for itching.     docusate sodium 100 MG capsule  Commonly known as:  COLACE  Take 200 mg by mouth daily.     gabapentin 300 MG capsule  Commonly known as:  NEURONTIN  Take 1 capsule (300 mg total) by  mouth 3 (three) times daily.     HYDROcodone-acetaminophen 5-325 MG per tablet  Commonly known as:  NORCO/VICODIN  Take 1-2 tablets by mouth every 4 (four) hours as needed for moderate pain or severe pain.     ibuprofen 800 MG tablet  Commonly known as:  ADVIL,MOTRIN  Take 1 tablet (800 mg total) by mouth every 8 (eight) hours as needed (mild pain).     naproxen sodium 220 MG tablet  Commonly known as:  ANAPROX  Take 440 mg by mouth 3 (three) times daily as needed (pain).     ondansetron 4 MG tablet  Commonly known as:  ZOFRAN  Take 1 tablet (4 mg total) by mouth every 6 (six) hours as needed for nausea.     ondansetron 8 MG disintegrating tablet  Commonly known as:  ZOFRAN ODT  Take 1 tablet (8 mg total) by mouth every 8 (eight) hours as needed for nausea or vomiting.     oxyCODONE-acetaminophen 7.5-325 MG per tablet  Commonly known as:  PERCOCET  Take 1 tablet by mouth every 4 (four) hours as needed for severe pain.     oxyCODONE-acetaminophen 5-325 MG per tablet  Commonly known as:  PERCOCET/ROXICET  Take 1-2 tablets by mouth every 4 (four) hours as needed (moderate to severe pain (when tolerating fluids)).     phenazopyridine 100 MG tablet  Commonly known as:  PYRIDIUM  Take 1 tablet (100 mg total) by mouth 3 (three) times daily with meals.     sucralfate 1 G tablet  Commonly known as:  CARAFATE  Take 1 tablet (1 g total) by mouth 4 (four) times daily.     tamsulosin 0.4 MG Caps capsule  Commonly known as:  FLOMAX  Take 1 capsule (0.4 mg total) by mouth daily after supper.     traZODone 50 MG tablet  Commonly known as:  DESYREL  Take 100 mg by mouth at bedtime.         Signed: Donaciano Eva 05/22/2015, 9:18 AM

## 2015-05-22 NOTE — Progress Notes (Signed)
Staff walked with pt post op. She walked about 180 ft then stated that she felt dizzy and weak , and needed to sit down.  Staff grabbed a chair and helped pt in it, then took pt back to bed. Will continue to monitor.

## 2015-05-25 ENCOUNTER — Telehealth: Payer: Self-pay | Admitting: *Deleted

## 2015-05-25 ENCOUNTER — Other Ambulatory Visit: Payer: Self-pay | Admitting: Gynecologic Oncology

## 2015-05-25 DIAGNOSIS — E8940 Asymptomatic postprocedural ovarian failure: Secondary | ICD-10-CM

## 2015-05-25 MED ORDER — ESTRADIOL 0.5 MG PO TABS: 0.5000 mg | ORAL_TABLET | Freq: Every day | ORAL | Status: DC

## 2015-05-25 NOTE — Telephone Encounter (Signed)
Message left with Pt's husband with   Post-op appointment details. Pt has a scheduled appointment on 06/22/2015 with Dr. Denman George.

## 2015-05-25 NOTE — Progress Notes (Signed)
Received phone call from patient stating premarin tablets were too expensive.  Advised patient's husband on return call that estradiol 0.5 mg once daily would be sent to [facility] and would be on $4 list.  Advised to call for any questions or concerns.

## 2015-06-01 ENCOUNTER — Other Ambulatory Visit: Payer: Self-pay | Admitting: Gynecologic Oncology

## 2015-06-01 DIAGNOSIS — N801 Endometriosis of ovary: Secondary | ICD-10-CM

## 2015-06-01 DIAGNOSIS — N80109 Endometriosis of ovary, unspecified side, unspecified depth: Secondary | ICD-10-CM

## 2015-06-02 ENCOUNTER — Telehealth: Payer: Self-pay | Admitting: *Deleted

## 2015-06-02 MED ORDER — IBUPROFEN 800 MG PO TABS: 800.0000 mg | ORAL_TABLET | Freq: Three times a day (TID) | ORAL | Status: DC | PRN

## 2015-06-02 MED ORDER — OXYCODONE-ACETAMINOPHEN 5-325 MG PO TABS: 1.0000 | ORAL_TABLET | ORAL | Status: DC | PRN

## 2015-06-02 NOTE — Telephone Encounter (Signed)
Received electronic medication refill request from patient - called and notified patient that Joylene John, NP has sent refill for ibuprofen to her pharmacy and a paper prescription for Percocet has been placed in the prescription binder at [facility] for her to pickup. Instructed patient that she will need to bring a photo ID to pickup prescription.  Patient states she is still experiencing generalized abdominal pain and soreness - notes that the pain is the worst at the incision under her left breast. Denies any fevers, redness, swelling, or pus drainage from any incision sites. She states the upper left incision did have a small amount of clear/red drainage yesterday - told patient she can place neosporin and a bandaid over that site. Instructed patient to continue alternating ibuprofen with the percocet. Patient inquired if she needs to refill gabapentin that was prescribed at hospital discharge. Per Joylene John, NP patient instructed that she does not need to refill the gabapentin.  Instructed patient to please call our office if the pain persists or increases, she notices any signs of infection or has any other concerns prior to her followup appt scheduled for 06/22/15 with Dr. Denman George. Patient agreeable to this.

## 2015-06-22 ENCOUNTER — Encounter: Payer: Self-pay | Admitting: Gynecologic Oncology

## 2015-06-22 ENCOUNTER — Ambulatory Visit: Payer: BLUE CROSS/BLUE SHIELD | Attending: Gynecologic Oncology | Admitting: Gynecologic Oncology

## 2015-06-22 VITALS — BP 109/64 | HR 73 | Temp 98.2°F | Resp 18

## 2015-06-22 DIAGNOSIS — E8940 Asymptomatic postprocedural ovarian failure: Secondary | ICD-10-CM

## 2015-06-22 DIAGNOSIS — Z79890 Hormone replacement therapy (postmenopausal): Secondary | ICD-10-CM

## 2015-06-22 DIAGNOSIS — N801 Endometriosis of ovary: Secondary | ICD-10-CM | POA: Diagnosis present

## 2015-06-22 DIAGNOSIS — N80109 Endometriosis of ovary, unspecified side, unspecified depth: Secondary | ICD-10-CM

## 2015-06-22 MED ORDER — MAGNESIUM CITRATE PO SOLN: 296.0000 mL | Freq: Once | ORAL | Status: DC

## 2015-06-22 MED ORDER — ESTROGENS CONJUGATED 0.625 MG PO TABS: 0.4500 mg | ORAL_TABLET | Freq: Every day | ORAL | Status: DC

## 2015-06-22 NOTE — Progress Notes (Signed)
POSTOPERATIVE FOLLOWUP  Assessment:    39 y.o. year old with history of endoemtriosis.   S/p robotic hyst, bso on 05/21/15.   Plan: 1) Pathology reports reviewed today 2) Treatment counseling - she requires no specific followup or treatment other than estrogen replacement until approximately age 71. We will increase the dose of this today to 0.65 to see if this improves her symptosm of mood irritability. Counseled about mag citrate prn and regular miralx and avoidance of narcotics for constipation. I discussed that patients can develop chronic pain postoperatively particularly if it had preoperative chronic pelvic pain. We believe this is due to up regulation of C fibers in the pelvis. If this is the case she may need to see a chronic pelvic pain specialist for management of this.  With respect to her left chest wall pain that developed postoperatively. I do not believe that this is a cardiac or pulmonary process. Given its temporal onset postoperatively it may be secondary to some positioning in the operating room and potentially related to her thoracic spine. I recommend that she be evaluated by physical therapy for this and notify us if she does not have improvement in symptoms so that we can further evaluate. She was given the opportunity to ask questions, which were answered to her satisfaction, and she is agreement with the above mentioned plan of care.  3)  Return to clinic on a prn basis only. She will return to Dr Nelda Marseille for annual well woman care.    HPI:  Adisson Deak is a 39 y.o. year old No obstetric history on file. initially seen in consultation on 05/15/15 for abnormal uterine bleeding and endometroisis pain refractory to medical therapies.  She then underwent a robotic assisted TLH, BSO on 7/78/24 without complications.  Her postoperative course was uncomplicated.  Her final pathology revealed endometriosis of the right ovary. Interestingly, intraoperatively, her surgical findings  were unremarkable - there was no adhesive disease and only a 1cm powderburn lesion on the left ovary, but no other abnormalities.  She is seen today for a postoperative check and to discuss her pathology results and ongoing plan.  Since discharge from the hospital, she is feeling pain in her lower abdomen and constipation. She has significant discomfort since surgery.She is having symptoms of mood irritability postop. She is having symptoms of left chest wall discomfort postop - worse with elevation of arms above head. The pain is not better with antacids/antireflux meds. It is not associated with cough. She has no concerning risk factors for cardiac disease.    Review of systems: Constitutional:  She has no weight gain or weight loss. She has no fever or chills. Eyes: No blurred vision Ears, Nose, Mouth, Throat: No dizziness, headaches or changes in hearing. No mouth sores. Cardiovascular: + chest pain, no palpitations or edema. Respiratory:  No shortness of breath, wheezing or cough Gastrointestinal: She has normal bowel movements without diarrhea +constipation. She denies any nausea or vomiting. She denies blood in her stool or heart burn. Genitourinary:  She denies pelvic pain, pelvic pressure or changes in her urinary function. She has no hematuria, dysuria, or incontinence. + vaginal bleeding postop Musculoskeletal: Denies muscle weakness or joint pains.  Skin:  She has no skin changes, rashes or itching Neurological:  Denies dizziness or headaches. No neuropathy, no numbness or tingling. Psychiatric:  She denies depression or anxiety. Hematologic/Lymphatic:   No easy bruising or bleeding   Physical Exam: Blood pressure 109/64, pulse 73, temperature 98.2 F (36.8  C), temperature source Oral, resp. rate 18. General: Well dressed, well nourished in no apparent distress.   HEENT:  Normocephalic and atraumatic, no lesions.  Extraocular muscles intact. Sclerae anicteric. Pupils equal,  round, reactive. No mouth sores or ulcers. Thyroid is normal size, not nodular, midline. Skin:  No lesions or rashes. Abdomen:  Soft, nontender, nondistended.  No palpable masses.  No hepatosplenomegaly.  No ascites. Normal bowel sounds.  No hernias.  Incisions are well healed Genitourinary: Normal EGBUS  Vaginal cuff intact.  No bleeding or discharge.  No cul de sac fullness. Healing normally with no blood in vagina. Extremities: No cyanosis, clubbing or edema.  No calf tenderness or erythema. No palpable cords. Psychiatric: Mood and affect are appropriate. Neurological: Awake, alert and oriented x 3. Sensation is intact, no neuropathy.  Musculoskeletal: No pain, normal strength and range of motion.  Donaciano Eva, MD

## 2015-06-22 NOTE — Patient Instructions (Signed)
No follow ups at this time. Please call our office if you have any concerns.

## 2015-08-20 ENCOUNTER — Encounter (HOSPITAL_COMMUNITY): Payer: Self-pay

## 2015-08-20 ENCOUNTER — Ambulatory Visit (HOSPITAL_COMMUNITY)
Admission: EM | Admit: 2015-08-20 | Discharge: 2015-08-22 | Disposition: A | Discharge status: Home / Self Care | Payer: BLUE CROSS/BLUE SHIELD | Attending: Emergency Medicine | Admitting: Emergency Medicine

## 2015-08-20 DIAGNOSIS — Z79899 Other long term (current) drug therapy: Secondary | ICD-10-CM | POA: Insufficient documentation

## 2015-08-20 DIAGNOSIS — K5900 Constipation, unspecified: Secondary | ICD-10-CM

## 2015-08-20 DIAGNOSIS — T8132XA Disruption of internal operation (surgical) wound, not elsewhere classified, initial encounter: Secondary | ICD-10-CM | POA: Insufficient documentation

## 2015-08-20 DIAGNOSIS — T8131XA Disruption of external operation (surgical) wound, not elsewhere classified, initial encounter: Secondary | ICD-10-CM

## 2015-08-20 DIAGNOSIS — Z85820 Personal history of malignant melanoma of skin: Secondary | ICD-10-CM | POA: Insufficient documentation

## 2015-08-20 DIAGNOSIS — Y838 Other surgical procedures as the cause of abnormal reaction of the patient, or of later complication, without mention of misadventure at the time of the procedure: Secondary | ICD-10-CM | POA: Insufficient documentation

## 2015-08-20 DIAGNOSIS — T81328A Disruption or dehiscence of closure of other specified internal operation (surgical) wound, initial encounter: Secondary | ICD-10-CM

## 2015-08-20 DIAGNOSIS — F329 Major depressive disorder, single episode, unspecified: Secondary | ICD-10-CM | POA: Insufficient documentation

## 2015-08-20 DIAGNOSIS — N939 Abnormal uterine and vaginal bleeding, unspecified: Secondary | ICD-10-CM | POA: Insufficient documentation

## 2015-08-20 DIAGNOSIS — Z87442 Personal history of urinary calculi: Secondary | ICD-10-CM | POA: Insufficient documentation

## 2015-08-20 DIAGNOSIS — Z791 Long term (current) use of non-steroidal anti-inflammatories (NSAID): Secondary | ICD-10-CM | POA: Insufficient documentation

## 2015-08-20 DIAGNOSIS — Z90710 Acquired absence of both cervix and uterus: Secondary | ICD-10-CM | POA: Insufficient documentation

## 2015-08-20 NOTE — ED Notes (Signed)
Pt presents with c/o abdominal pain that started earlier this week and vaginal bleeding that started this evening. Pt reports she had a hysterectomy on [id number], no complications after the surgery. Pt reports she has been passing clots since the bleeding started. Pt also reports some vomiting tonight as well.

## 2015-08-21 ENCOUNTER — Encounter (HOSPITAL_COMMUNITY): Payer: Self-pay

## 2015-08-21 ENCOUNTER — Emergency Department (HOSPITAL_COMMUNITY): Payer: BLUE CROSS/BLUE SHIELD | Admitting: Certified Registered Nurse Anesthetist

## 2015-08-21 ENCOUNTER — Encounter (HOSPITAL_COMMUNITY)
Admission: EM | Disposition: A | Discharge status: Home / Self Care | Payer: Self-pay | Source: Home / Self Care | Attending: Emergency Medicine

## 2015-08-21 ENCOUNTER — Emergency Department (HOSPITAL_COMMUNITY): Payer: BLUE CROSS/BLUE SHIELD

## 2015-08-21 DIAGNOSIS — T81328A Disruption or dehiscence of closure of other specified internal operation (surgical) wound, initial encounter: Secondary | ICD-10-CM

## 2015-08-21 DIAGNOSIS — T8131XA Disruption of external operation (surgical) wound, not elsewhere classified, initial encounter: Secondary | ICD-10-CM | POA: Diagnosis not present

## 2015-08-21 DIAGNOSIS — N939 Abnormal uterine and vaginal bleeding, unspecified: Secondary | ICD-10-CM | POA: Diagnosis not present

## 2015-08-21 HISTORY — PX: REPAIR VAGINAL CUFF: SHX6067

## 2015-08-21 LAB — COMPREHENSIVE METABOLIC PANEL
ALBUMIN: 4 g/dL (ref 3.5–5.0)
ALK PHOS: 65 U/L (ref 38–126)
ALT: 27 U/L (ref 14–54)
AST: 28 U/L (ref 15–41)
Anion gap: 7 (ref 5–15)
BUN: 29 mg/dL — ABNORMAL HIGH (ref 6–20)
CALCIUM: 8.7 mg/dL — AB (ref 8.9–10.3)
CHLORIDE: 104 mmol/L (ref 101–111)
CO2: 25 mmol/L (ref 22–32)
CREATININE: 0.67 mg/dL (ref 0.44–1.00)
GFR calc Af Amer: >60 mL/min (ref >60)
GFR calc non Af Amer: >60 mL/min (ref >60)
GLUCOSE: 116 mg/dL — AB (ref 65–99)
Potassium: 3.8 mmol/L (ref 3.5–5.1)
SODIUM: 136 mmol/L (ref 135–145)
Total Bilirubin: 0.5 mg/dL (ref 0.3–1.2)
Total Protein: 7 g/dL (ref 6.5–8.1)

## 2015-08-21 LAB — LIPASE, BLOOD: LIPASE: 40 U/L (ref 11–51)

## 2015-08-21 LAB — URINALYSIS, ROUTINE W REFLEX MICROSCOPIC
BILIRUBIN URINE: NEGATIVE
Bilirubin Urine: NEGATIVE
GLUCOSE, UA: NEGATIVE mg/dL
Glucose, UA: NEGATIVE mg/dL
HGB URINE DIPSTICK: NEGATIVE
KETONES UR: NEGATIVE mg/dL
Ketones, ur: NEGATIVE mg/dL
Leukocytes, UA: NEGATIVE
Nitrite: NEGATIVE
Nitrite: NEGATIVE
PROTEIN: 100 mg/dL — AB
PROTEIN: NEGATIVE mg/dL
SPECIFIC GRAVITY, URINE: 1.026 (ref 1.005–1.030)
Specific Gravity, Urine: 1.021 (ref 1.005–1.030)
pH: 5.5 (ref 5.0–8.0)
pH: 7 (ref 5.0–8.0)

## 2015-08-21 LAB — URINE MICROSCOPIC-ADD ON

## 2015-08-21 LAB — WET PREP, GENITAL
CLUE CELLS WET PREP: NONE SEEN
SPERM: NONE SEEN
TRICH WET PREP: NONE SEEN
YEAST WET PREP: NONE SEEN

## 2015-08-21 LAB — CBC
HCT: 35 % — ABNORMAL LOW (ref 36.0–46.0)
HEMOGLOBIN: 11.7 g/dL — AB (ref 12.0–15.0)
MCH: 29.5 pg (ref 26.0–34.0)
MCHC: 33.4 g/dL (ref 30.0–36.0)
MCV: 88.4 fL (ref 78.0–100.0)
PLATELETS: 156 10*3/uL (ref 150–400)
RBC: 3.96 MIL/uL (ref 3.87–5.11)
RDW: 12.9 % (ref 11.5–15.5)
WBC: 9.4 10*3/uL (ref 4.0–10.5)

## 2015-08-21 SURGERY — REPAIR, VAGINAL CUFF
Anesthesia: General

## 2015-08-21 MED ORDER — DOCUSATE SODIUM 100 MG PO CAPS
300.0000 mg | ORAL_CAPSULE | Freq: Every day | ORAL | Status: DC
  Administered 2015-08-21 – 2015-08-22 (×2): 300 mg via ORAL

## 2015-08-21 MED ORDER — CLINDAMYCIN PHOSPHATE 900 MG/50ML IV SOLN
900.0000 mg | INTRAVENOUS | Status: AC
  Administered 2015-08-21: 900 mg via INTRAVENOUS

## 2015-08-21 MED ORDER — OXYCODONE-ACETAMINOPHEN 5-325 MG PO TABS: 1.0000 | ORAL_TABLET | ORAL | Status: DC | PRN

## 2015-08-21 MED ORDER — PROMETHAZINE HCL 25 MG/ML IJ SOLN
6.2500 mg | INTRAMUSCULAR | Status: DC | PRN
  Administered 2015-08-21: 6.25 mg via INTRAVENOUS

## 2015-08-21 MED ORDER — HYDROMORPHONE HCL 1 MG/ML IJ SOLN
1.0000 mg | Freq: Once | INTRAMUSCULAR | Status: DC
  Filled 2015-08-21: qty 1

## 2015-08-21 MED ORDER — ADULT MULTIVITAMIN W/MINERALS CH: 1.0000 | ORAL_TABLET | Freq: Every day | ORAL | Status: DC

## 2015-08-21 MED ORDER — PROPOFOL 10 MG/ML IV BOLUS
INTRAVENOUS | Status: AC
  Filled 2015-08-21: qty 20

## 2015-08-21 MED ORDER — PROPOFOL 10 MG/ML IV BOLUS
INTRAVENOUS | Status: DC | PRN
  Administered 2015-08-21: 150 mg via INTRAVENOUS

## 2015-08-21 MED ORDER — SODIUM CHLORIDE 0.9 % IV BOLUS (SEPSIS)
1000.0000 mL | Freq: Once | INTRAVENOUS | Status: AC
  Administered 2015-08-21: 1000 mL via INTRAVENOUS

## 2015-08-21 MED ORDER — DEXAMETHASONE SODIUM PHOSPHATE 10 MG/ML IJ SOLN
INTRAMUSCULAR | Status: AC
  Filled 2015-08-21: qty 1

## 2015-08-21 MED ORDER — ROCURONIUM BROMIDE 100 MG/10ML IV SOLN
INTRAVENOUS | Status: AC
  Filled 2015-08-21: qty 1

## 2015-08-21 MED ORDER — ESTRADIOL 1 MG PO TABS: 0.5000 mg | ORAL_TABLET | Freq: Every day | ORAL | Status: DC

## 2015-08-21 MED ORDER — ONDANSETRON 4 MG PO TBDP
8.0000 mg | ORAL_TABLET | Freq: Three times a day (TID) | ORAL | Status: DC | PRN
Start: 2015-08-21 — End: 2015-08-21

## 2015-08-21 MED ORDER — MIDAZOLAM HCL 2 MG/2ML IJ SOLN
INTRAMUSCULAR | Status: AC
  Filled 2015-08-21: qty 2

## 2015-08-21 MED ORDER — HYDROMORPHONE HCL 1 MG/ML IJ SOLN
1.0000 mg | Freq: Once | INTRAMUSCULAR | Status: AC
  Administered 2015-08-21: 1 mg via INTRAVENOUS

## 2015-08-21 MED ORDER — POTASSIUM CHLORIDE IN NACL 20-0.45 MEQ/L-% IV SOLN
INTRAVENOUS | Status: DC
  Administered 2015-08-21: 16:00:00 via INTRAVENOUS
  Filled 2015-08-21 (×2): qty 1000

## 2015-08-21 MED ORDER — HYDROMORPHONE HCL 1 MG/ML IJ SOLN
0.5000 mg | Freq: Once | INTRAMUSCULAR | Status: AC
  Administered 2015-08-21: 0.5 mg via INTRAVENOUS
  Filled 2015-08-21: qty 1

## 2015-08-21 MED ORDER — DIPHENHYDRAMINE HCL 25 MG PO CAPS: 25.0000 mg | ORAL_CAPSULE | Freq: Four times a day (QID) | ORAL | Status: DC | PRN

## 2015-08-21 MED ORDER — IBUPROFEN 200 MG PO TABS
800.0000 mg | ORAL_TABLET | Freq: Three times a day (TID) | ORAL | Status: DC | PRN
  Filled 2015-08-21: qty 4

## 2015-08-21 MED ORDER — HYDROMORPHONE HCL 1 MG/ML IJ SOLN
INTRAMUSCULAR | Status: AC
  Filled 2015-08-21: qty 1

## 2015-08-21 MED ORDER — HYDROCODONE-ACETAMINOPHEN 5-325 MG PO TABS
1.0000 | ORAL_TABLET | ORAL | Status: DC | PRN
  Administered 2015-08-21 – 2015-08-22 (×4): 2 via ORAL
  Filled 2015-08-21 (×4): qty 2

## 2015-08-21 MED ORDER — LIDOCAINE HCL (CARDIAC) 20 MG/ML IV SOLN
INTRAVENOUS | Status: AC
Start: 2015-08-21 — End: 2015-08-21
  Filled 2015-08-21: qty 5

## 2015-08-21 MED ORDER — LACTATED RINGERS IV SOLN
INTRAVENOUS | Status: DC | PRN
  Administered 2015-08-21 (×2): via INTRAVENOUS

## 2015-08-21 MED ORDER — MIDAZOLAM HCL 5 MG/5ML IJ SOLN
INTRAMUSCULAR | Status: DC | PRN
  Administered 2015-08-21: 2 mg via INTRAVENOUS

## 2015-08-21 MED ORDER — TRAZODONE HCL 100 MG PO TABS
100.0000 mg | ORAL_TABLET | Freq: Every day | ORAL | Status: DC
  Administered 2015-08-21: 100 mg via ORAL
  Filled 2015-08-21 (×2): qty 1

## 2015-08-21 MED ORDER — ACETAMINOPHEN 325 MG PO TABS: 650.0000 mg | ORAL_TABLET | ORAL | Status: DC | PRN

## 2015-08-21 MED ORDER — HYDROMORPHONE HCL 1 MG/ML IJ SOLN
0.5000 mg | Freq: Once | INTRAMUSCULAR | Status: DC
  Filled 2015-08-21: qty 1

## 2015-08-21 MED ORDER — METRONIDAZOLE IN NACL 5-0.79 MG/ML-% IV SOLN
INTRAVENOUS | Status: AC
  Filled 2015-08-21: qty 100

## 2015-08-21 MED ORDER — TAMSULOSIN HCL 0.4 MG PO CAPS
0.4000 mg | ORAL_CAPSULE | Freq: Every day | ORAL | Status: DC
  Filled 2015-08-21: qty 1

## 2015-08-21 MED ORDER — DICLOFENAC SODIUM 75 MG PO TBEC
75.0000 mg | DELAYED_RELEASE_TABLET | Freq: Two times a day (BID) | ORAL | Status: DC
  Administered 2015-08-21 – 2015-08-22 (×2): 75 mg via ORAL
  Filled 2015-08-21 (×3): qty 1

## 2015-08-21 MED ORDER — SUGAMMADEX SODIUM 200 MG/2ML IV SOLN
INTRAVENOUS | Status: DC | PRN
  Administered 2015-08-21: 200 mg via INTRAVENOUS

## 2015-08-21 MED ORDER — HYDROMORPHONE HCL 1 MG/ML IJ SOLN
1.0000 mg | Freq: Once | INTRAMUSCULAR | Status: AC
  Administered 2015-08-21: 1 mg via INTRAMUSCULAR

## 2015-08-21 MED ORDER — CYCLOBENZAPRINE HCL 10 MG PO TABS
10.0000 mg | ORAL_TABLET | Freq: Two times a day (BID) | ORAL | Status: DC
  Administered 2015-08-21 – 2015-08-22 (×2): 10 mg via ORAL
  Filled 2015-08-21 (×3): qty 1

## 2015-08-21 MED ORDER — ZOLPIDEM TARTRATE 5 MG PO TABS
5.0000 mg | ORAL_TABLET | Freq: Every evening | ORAL | Status: DC | PRN
Start: 2015-08-21 — End: 2015-08-22

## 2015-08-21 MED ORDER — ROCURONIUM BROMIDE 100 MG/10ML IV SOLN
INTRAVENOUS | Status: DC | PRN
  Administered 2015-08-21: 10 mg via INTRAVENOUS
  Administered 2015-08-21: 40 mg via INTRAVENOUS
  Administered 2015-08-21: 10 mg via INTRAVENOUS

## 2015-08-21 MED ORDER — ONDANSETRON HCL 4 MG/2ML IJ SOLN
INTRAMUSCULAR | Status: DC | PRN
Start: 2015-08-21 — End: 2015-08-21
  Administered 2015-08-21: 4 mg via INTRAVENOUS

## 2015-08-21 MED ORDER — HYDROMORPHONE HCL 1 MG/ML IJ SOLN
0.2000 mg | INTRAMUSCULAR | Status: DC | PRN
  Administered 2015-08-21 (×2): 0.5 mg via INTRAVENOUS
  Filled 2015-08-21 (×3): qty 1

## 2015-08-21 MED ORDER — ONDANSETRON HCL 4 MG/2ML IJ SOLN
4.0000 mg | Freq: Once | INTRAMUSCULAR | Status: AC
  Administered 2015-08-21: 4 mg via INTRAVENOUS
  Filled 2015-08-21: qty 2

## 2015-08-21 MED ORDER — POLYETHYLENE GLYCOL 3350 17 G PO PACK
17.0000 g | PACK | Freq: Every day | ORAL | Status: DC | PRN
  Administered 2015-08-22: 17 g via ORAL
  Filled 2015-08-21: qty 1

## 2015-08-21 MED ORDER — GABAPENTIN 300 MG PO CAPS: 300.0000 mg | ORAL_CAPSULE | Freq: Three times a day (TID) | ORAL | Status: DC

## 2015-08-21 MED ORDER — IOHEXOL 300 MG/ML  SOLN
100.0000 mL | Freq: Once | INTRAMUSCULAR | Status: AC | PRN
  Administered 2015-08-21: 100 mL via INTRAVENOUS

## 2015-08-21 MED ORDER — FENTANYL CITRATE (PF) 250 MCG/5ML IJ SOLN
INTRAMUSCULAR | Status: AC
  Filled 2015-08-21: qty 5

## 2015-08-21 MED ORDER — OCUVITE-LUTEIN PO CAPS
1.0000 | ORAL_CAPSULE | Freq: Every day | ORAL | Status: DC
  Administered 2015-08-21 – 2015-08-22 (×2): 1 via ORAL
  Filled 2015-08-21 (×3): qty 1

## 2015-08-21 MED ORDER — LIDOCAINE HCL (CARDIAC) 20 MG/ML IV SOLN
INTRAVENOUS | Status: DC | PRN
  Administered 2015-08-21: 60 mg via INTRAVENOUS

## 2015-08-21 MED ORDER — CLONAZEPAM 0.5 MG PO TABS: 0.5000 mg | ORAL_TABLET | Freq: Two times a day (BID) | ORAL | Status: DC | PRN

## 2015-08-21 MED ORDER — CLINDAMYCIN PHOSPHATE 900 MG/50ML IV SOLN
INTRAVENOUS | Status: AC
  Filled 2015-08-21: qty 50

## 2015-08-21 MED ORDER — FENTANYL CITRATE (PF) 100 MCG/2ML IJ SOLN
INTRAMUSCULAR | Status: DC | PRN
  Administered 2015-08-21: 100 ug via INTRAVENOUS
  Administered 2015-08-21: 50 ug via INTRAVENOUS

## 2015-08-21 MED ORDER — SUCRALFATE 1 G PO TABS
1.0000 g | ORAL_TABLET | Freq: Four times a day (QID) | ORAL | Status: DC
  Filled 2015-08-21 (×3): qty 1

## 2015-08-21 MED ORDER — SUGAMMADEX SODIUM 200 MG/2ML IV SOLN
INTRAVENOUS | Status: AC
  Filled 2015-08-21: qty 2

## 2015-08-21 MED ORDER — ESTROGENS CONJUGATED 0.45 MG PO TABS
0.4500 mg | ORAL_TABLET | Freq: Every day | ORAL | Status: DC
  Administered 2015-08-21 – 2015-08-22 (×2): 0.45 mg via ORAL
  Filled 2015-08-21 (×3): qty 1

## 2015-08-21 MED ORDER — HYDROMORPHONE HCL 1 MG/ML IJ SOLN
0.2500 mg | INTRAMUSCULAR | Status: DC | PRN
  Administered 2015-08-21 (×4): 0.5 mg via INTRAVENOUS

## 2015-08-21 MED ORDER — INFLUENZA VAC SPLIT QUAD 0.5 ML IM SUSY
0.5000 mL | PREFILLED_SYRINGE | INTRAMUSCULAR | Status: DC
  Filled 2015-08-21 (×2): qty 0.5

## 2015-08-21 MED ORDER — DEXAMETHASONE SODIUM PHOSPHATE 10 MG/ML IJ SOLN
INTRAMUSCULAR | Status: DC | PRN
  Administered 2015-08-21: 10 mg via INTRAVENOUS

## 2015-08-21 MED ORDER — BISACODYL 10 MG RE SUPP: 10.0000 mg | Freq: Every day | RECTAL | Status: DC | PRN

## 2015-08-21 MED ORDER — LACTATED RINGERS IV SOLN
INTRAVENOUS | Status: DC
  Administered 2015-08-21: 1000 mL via INTRAVENOUS

## 2015-08-21 MED ORDER — PANTOPRAZOLE SODIUM 40 MG PO TBEC
40.0000 mg | DELAYED_RELEASE_TABLET | Freq: Every day | ORAL | Status: DC
  Administered 2015-08-21 – 2015-08-22 (×2): 40 mg via ORAL
  Filled 2015-08-21 (×2): qty 1

## 2015-08-21 MED ORDER — CITALOPRAM HYDROBROMIDE 20 MG PO TABS
20.0000 mg | ORAL_TABLET | Freq: Every day | ORAL | Status: DC
  Administered 2015-08-21: 20 mg via ORAL
  Filled 2015-08-21 (×2): qty 1

## 2015-08-21 MED ORDER — PROMETHAZINE HCL 25 MG/ML IJ SOLN
INTRAMUSCULAR | Status: AC
  Filled 2015-08-21: qty 1

## 2015-08-21 MED ORDER — HYDROMORPHONE HCL 1 MG/ML IJ SOLN
1.0000 mg | Freq: Once | INTRAMUSCULAR | Status: AC
  Administered 2015-08-21: 1 mg via INTRAVENOUS
  Filled 2015-08-21: qty 1

## 2015-08-21 MED ORDER — ALUM & MAG HYDROXIDE-SIMETH 200-200-20 MG/5ML PO SUSP: 30.0000 mL | ORAL | Status: DC | PRN

## 2015-08-21 MED ORDER — ONDANSETRON HCL 4 MG/2ML IJ SOLN
INTRAMUSCULAR | Status: AC
  Filled 2015-08-21: qty 2

## 2015-08-21 MED ORDER — METRONIDAZOLE IN NACL 5-0.79 MG/ML-% IV SOLN
500.0000 mg | INTRAVENOUS | Status: AC
  Administered 2015-08-21: 500 mg via INTRAVENOUS

## 2015-08-21 MED ORDER — HYDROCODONE-ACETAMINOPHEN 5-325 MG PO TABS
1.0000 | ORAL_TABLET | ORAL | Status: DC | PRN
  Filled 2015-08-21: qty 2

## 2015-08-21 SURGICAL SUPPLY — 23 items
DRAPE LEGGING IMPERMEABLE (DRAPES) ×2 IMPLANT
DRAPE LG THREE QUARTER DISP (DRAPES) ×4 IMPLANT
DRAPE WARM FLUID 44X44 (DRAPE) ×2 IMPLANT
ELECT BLADE 6 FLAT ULTRCLN (ELECTRODE) ×2 IMPLANT
ELECT REM PT RETURN 9FT ADLT (ELECTROSURGICAL) ×2
ELECTRODE REM PT RTRN 9FT ADLT (ELECTROSURGICAL) ×1 IMPLANT
GAUZE SPONGE 4X4 16PLY XRAY LF (GAUZE/BANDAGES/DRESSINGS) ×2 IMPLANT
GLOVE BIO SURGEON STRL SZ 6 (GLOVE) ×2 IMPLANT
GOWN STRL REUS W/TWL LRG LVL4 (GOWN DISPOSABLE) ×4 IMPLANT
HOLDER FOLEY CATH W/STRAP (MISCELLANEOUS) ×2 IMPLANT
KIT BASIN OR (CUSTOM PROCEDURE TRAY) ×2 IMPLANT
MANIFOLD NEPTUNE II (INSTRUMENTS) ×2 IMPLANT
MARKER SKIN DUAL TIP RULER LAB (MISCELLANEOUS) ×2 IMPLANT
PACK MINOR VAGINAL W LONG (CUSTOM PROCEDURE TRAY) ×2 IMPLANT
PENCIL BUTTON HOLSTER BLD 10FT (ELECTRODE) ×2 IMPLANT
SHEET LAVH (DRAPES) ×2 IMPLANT
SUT PDS AB 0 CT1 36 (SUTURE) ×10 IMPLANT
SUT VIC AB 0 CT1 18XCR BRD 8 (SUTURE) ×1 IMPLANT
SUT VIC AB 0 CT1 8-18 (SUTURE) ×2
TOWEL OR 17X26 10 PK STRL BLUE (TOWEL DISPOSABLE) ×4 IMPLANT
TOWEL OR NON WOVEN STRL DISP B (DISPOSABLE) ×2 IMPLANT
TRAY FOLEY W/METER SILVER 14FR (SET/KITS/TRAYS/PACK) ×2 IMPLANT
YANKAUER SUCT BULB TIP NO VENT (SUCTIONS) ×2 IMPLANT

## 2015-08-21 NOTE — Transfer of Care (Signed)
Immediate Anesthesia Transfer of Care Note  Patient: Denise Avila  Procedure(s) Performed: Procedure(s): REPAIR VAGINAL CUFF, EXAM UNDER ANESTHESIA (N/A)  Patient Location: PACU  Anesthesia Type:General  Level of Consciousness:  sedated, patient cooperative and responds to stimulation  Airway & Oxygen Therapy:Patient Spontanous Breathing and Patient connected to face mask oxgen  Post-op Assessment:  Report given to PACU RN and Post -op Vital signs reviewed and stable  Post vital signs:  Reviewed and stable  Last Vitals:  Filed Vitals:   08/21/15 0417  BP: 111/78  Pulse: 82  Temp:   Resp: 16    Complications: No apparent anesthesia complications

## 2015-08-21 NOTE — Anesthesia Procedure Notes (Signed)
Procedure Name: Intubation Date/Time: 08/21/2015 7:05 AM Performed by: Maxwell Caul Pre-anesthesia Checklist: Patient identified, Emergency Drugs available, Suction available and Patient being monitored Patient Re-evaluated:Patient Re-evaluated prior to inductionOxygen Delivery Method: Circle System Utilized Preoxygenation: Pre-oxygenation with 100% oxygen Intubation Type: IV induction Ventilation: Mask ventilation without difficulty Laryngoscope Size: Mac and 3 Grade View: Grade III Tube type: Oral Tube size: 7.0 mm Number of attempts: 2 Airway Equipment and Method: Stylet Placement Confirmation: ETT inserted through vocal cords under direct vision,  positive ETCO2 and breath sounds checked- equal and bilateral Secured at: 22 cm Tube secured with: Tape Dental Injury: Teeth and Oropharynx as per pre-operative assessment

## 2015-08-21 NOTE — ED Provider Notes (Signed)
CSN: [id number]     Arrival date & time 08/20/15  2311 History   First MD Initiated Contact with Patient 08/20/15 2358     Chief Complaint  Patient presents with  . Vaginal Bleeding  . Abdominal Pain     (Consider location/radiation/quality/duration/timing/severity/associated sxs/prior Treatment) HPI Denise Avila is a 39 y.o. female presents to ED with complaint of abdominal pain and bleeding. Pt states pain has been mild in diffuse abdomen for a week. States pain worsened this morning. Reports bright red blood with clots in underwear and "all over the bathroom" onset tonight. Pt reports hx of hysterectomy 3 months ago, no complications since. Denies rectal pain. States last bowel movement was this morning and was normal.  Denies blood in stool this morning. Pt also reports hx of kidney stones but states it did not feel like this. Stats urine is bloody, looks like "fruit punch." Denies pain with urination. States abdomen feels distended. Pain is all over but states mainly around belly button and left upper quadrant. No tx prior to coming in.    Past Medical History  Diagnosis Date  . Endometriosis   . Migraines   . History of kidney stones   . Pleurisy 02/2015    while hospitalized   . Fibroids     age 44  . Abnormal Pap smear of cervix 12/2014  . Skin cancer (melanoma) ([facility])   . Depression    Past Surgical History  Procedure Laterality Date  . Ablation      uterine  . Dilation and curettage of uterus    . Cystoscopy w/ ureteral stent placement Left 02/02/2015    Procedure: CYSTOSCOPY WITH RETROGRADE PYELOGRAM/URETERAL STENT PLACEMENT;  Surgeon: Festus Aloe, MD;  Location: [facility];  Service: Urology;  Laterality: Left;  . Laparoscopy      x 4  . Cystoscopy with ureteroscopy and stent placement Left 02/13/2015    Procedure: LEFT URETEROSCOPY WITH HOLMIUM LASER AND STENT PLACEMENT;  Surgeon: Festus Aloe, MD;  Location: [facility];  Service: Urology;  Laterality: Left;  .  Holmium laser application N/A [id number]    Procedure: HOLMIUM LASER APPLICATION;  Surgeon: Festus Aloe, MD;  Location: [facility];  Service: Urology;  Laterality: N/A;  . Melanoma excision  2000    melanoma removed from face, basal cell removed from nose  . Robotic assisted total hysterectomy with bilateral salpingo oopherectomy Bilateral 05/21/2015    Procedure: XI ROBOTIC ASSISTED TOTAL HYSTERECTOMY WITH BILATERAL SALPINGO OOPHORECTOMY;  Surgeon: Everitt Amber, MD;  Location: [facility];  Service: Gynecology;  Laterality: Bilateral;   Family History  Problem Relation Age of Onset  . Hypertension Mother   . Cancer - Other Father     liver  . Diabetes Mellitus II Maternal Grandmother   . Heart disease Maternal Grandmother   . Cancer - Lung Maternal Grandfather   . Osteoporosis Paternal Grandmother   . Melanoma Paternal Grandfather    Social History  Substance Use Topics  . Smoking status: Never Smoker   . Smokeless tobacco: Never Used  . Alcohol Use: No   OB History    No data available     Review of Systems  Constitutional: Negative for fever and chills.  Respiratory: Negative for cough, chest tightness and shortness of breath.   Cardiovascular: Negative for chest pain, palpitations and leg swelling.  Gastrointestinal: Positive for nausea, vomiting, abdominal pain and abdominal distention. Negative for diarrhea, constipation and rectal pain.  Genitourinary: Positive for hematuria. Negative for dysuria, flank  pain, vaginal pain and pelvic pain.  Musculoskeletal: Negative for myalgias, arthralgias, neck pain and neck stiffness.  Skin: Negative for rash.  Neurological: Negative for dizziness, weakness and headaches.  All other systems reviewed and are negative.     Allergies  Amoxicillin; Ciprofloxacin; and Penicillins  Home Medications   Prior to Admission medications   Medication Sig Start Date End Date Taking? Authorizing Provider  citalopram (CELEXA) 20 MG tablet Take 20  mg by mouth at bedtime.    Historical Provider, MD  clonazePAM (KLONOPIN) 0.5 MG tablet Take 0.5 mg by mouth 2 (two) times daily as needed for anxiety.    Historical Provider, MD  cyclobenzaprine (FLEXERIL) 10 MG tablet  06/18/15   Historical Provider, MD  diclofenac (VOLTAREN) 75 MG EC tablet Take 75 mg by mouth 2 (two) times daily.    Historical Provider, MD  diphenhydrAMINE (BENADRYL) 25 mg capsule Take 1 capsule (25 mg total) by mouth every 6 (six) hours as needed for itching. 05/22/15   Everitt Amber, MD  docusate sodium (COLACE) 100 MG capsule Take 200 mg by mouth daily.     Historical Provider, MD  estradiol (ESTRACE) 0.5 MG tablet Take 1 tablet (0.5 mg total) by mouth daily. 05/25/15   Dorothyann Gibbs, NP  estrogens, conjugated, (PREMARIN) 0.625 MG tablet Take 1 tablet (0.625 mg total) by mouth daily. Take daily for 21 days then do not take for 7 days. 06/22/15   Everitt Amber, MD  gabapentin (NEURONTIN) 300 MG capsule Take 1 capsule (300 mg total) by mouth 3 (three) times daily. 05/22/15   Everitt Amber, MD  HYDROcodone-acetaminophen (NORCO/VICODIN) 5-325 MG per tablet Take 1-2 tablets by mouth every 4 (four) hours as needed for moderate pain or severe pain. 02/10/15   Velvet Bathe, MD  ibuprofen (ADVIL,MOTRIN) 800 MG tablet Take 1 tablet (800 mg total) by mouth every 8 (eight) hours as needed (mild pain). 06/02/15   Dorothyann Gibbs, NP  magnesium citrate solution Take 296 mLs by mouth once. 06/22/15   Everitt Amber, MD  naproxen sodium (ANAPROX) 220 MG tablet Take 440 mg by mouth 3 (three) times daily as needed (pain).    Historical Provider, MD  ondansetron (ZOFRAN ODT) 8 MG disintegrating tablet Take 1 tablet (8 mg total) by mouth every 8 (eight) hours as needed for nausea or vomiting. 02/24/15   Lacretia Leigh, MD  ondansetron (ZOFRAN) 4 MG tablet Take 1 tablet (4 mg total) by mouth every 6 (six) hours as needed for nausea. 05/22/15   Everitt Amber, MD  oxyCODONE-acetaminophen (PERCOCET/ROXICET) 5-325 MG per  tablet Take 1-2 tablets by mouth every 4 (four) hours as needed (moderate to severe pain). 06/02/15   Dorothyann Gibbs, NP  phenazopyridine (PYRIDIUM) 100 MG tablet Take 1 tablet (100 mg total) by mouth 3 (three) times daily with meals. Patient taking differently: Take 100 mg by mouth 3 (three) times daily as needed for pain.  02/10/15   Velvet Bathe, MD  sucralfate (CARAFATE) 1 G tablet Take 1 tablet (1 g total) by mouth 4 (four) times daily. Patient taking differently: Take 1 g by mouth 2 (two) times daily.  02/24/15   Lacretia Leigh, MD  tamsulosin (FLOMAX) 0.4 MG CAPS capsule Take 1 capsule (0.4 mg total) by mouth daily after supper. 02/10/15   Velvet Bathe, MD  traZODone (DESYREL) 50 MG tablet Take 100 mg by mouth at bedtime.     Historical Provider, MD   BP 101/67 mmHg  Pulse 77  Temp(Src)  97.4 F (36.3 C) (Oral)  Resp 18  SpO2 100%  LMP 05/04/2015 (Approximate) Physical Exam  Constitutional: She is oriented to person, place, and time. She appears well-developed and well-nourished. No distress.  HENT:  Head: Normocephalic.  Eyes: Conjunctivae are normal.  Neck: Neck supple.  Cardiovascular: Normal rate, regular rhythm and normal heart sounds.   Pulmonary/Chest: Effort normal and breath sounds normal. No respiratory distress. She has no wheezes. She has no rales.  Abdominal: Soft. Bowel sounds are normal. She exhibits no distension. There is tenderness. There is no rebound.  Abdomen diffusely tender  Genitourinary:  Bright red blood around rectum and vaginal opening. Solid stool in rectum, brown, bright red blood on finger  Musculoskeletal: She exhibits no edema.  Neurological: She is alert and oriented to person, place, and time.  Skin: Skin is warm and dry.  Psychiatric: She has a normal mood and affect. Her behavior is normal.  Nursing note and vitals reviewed.   ED Course  Procedures (including critical care time) Labs Review Labs Reviewed  LIPASE, BLOOD  COMPREHENSIVE  METABOLIC PANEL  CBC  URINALYSIS, ROUTINE W REFLEX MICROSCOPIC (NOT AT [facility])    Imaging Review No results found. I have personally reviewed and evaluated these images and lab results as part of my medical decision-making.   EKG Interpretation None      MDM   Final diagnoses:  None    Pt with abdominal pain, bleeding. Unsure of source of bleeding at this time. Will check UA, labs, CT abd pelvis. Fluids, medications ordered. Vs norma at this time.   CT negative other than constipation. Pelvic exam performed, pt with vaginal bleeding, pink tissue at the cuff, with active oozing of blood. Pt is very tender over the area. Concerned about cuff injury vs endometriosis. Pt denies intercourse or injuries recently.   5:04 AM Spoke with Dr. Denman George, pt's surgeon, will come by and see her in ED. redosed pain and nausea medications.   6:25 AM Dr. Denman George at bedside, will take to OR.   Jeannett Senior, PA-C 08/22/15 [place], MD 08/22/15 [phone]

## 2015-08-21 NOTE — ED Notes (Signed)
Iv team at bedside  

## 2015-08-21 NOTE — ED Notes (Addendum)
Attempted lab draw, but unsuccessful. PA,Tatyana made aware and RN,Kristen.

## 2015-08-21 NOTE — Anesthesia Postprocedure Evaluation (Signed)
  Anesthesia Post-op Note  Patient: Denise Avila  Procedure(s) Performed: Procedure(s) (LRB): REPAIR VAGINAL CUFF, EXAM UNDER ANESTHESIA (N/A)  Patient Location: PACU  Anesthesia Type: General  Level of Consciousness: awake and alert   Airway and Oxygen Therapy: Patient Spontanous Breathing  Post-op Pain: mild  Post-op Assessment: Post-op Vital signs reviewed, Patient's Cardiovascular Status Stable, Respiratory Function Stable, Patent Airway and No signs of Nausea or vomiting  Last Vitals:  Filed Vitals:   08/21/15 0915  BP: 99/62  Pulse: 89  Temp: 36.7 C  Resp: 12    Post-op Vital Signs: stable   Complications: No apparent anesthesia complications

## 2015-08-21 NOTE — Anesthesia Preprocedure Evaluation (Signed)
Anesthesia Evaluation  Patient identified by MRN, date of birth, ID band Patient awake    Reviewed: Allergy & Precautions, NPO status , Patient's Chart, lab work & pertinent test results  Airway Mallampati: II  TM Distance: >3 FB Neck ROM: Full    Dental no notable dental hx. (+) Dental Advisory Given, Teeth Intact   Pulmonary neg pulmonary ROS,    Pulmonary exam normal breath sounds clear to auscultation       Cardiovascular negative cardio ROS Normal cardiovascular exam Rhythm:Regular Rate:Normal     Neuro/Psych negative neurological ROS  negative psych ROS   GI/Hepatic negative GI ROS, Neg liver ROS,   Endo/Other  negative endocrine ROS  Renal/GU negative Renal ROS  negative genitourinary   Musculoskeletal negative musculoskeletal ROS (+)   Abdominal   Peds negative pediatric ROS (+)  Hematology negative hematology ROS (+)   Anesthesia Other Findings   Reproductive/Obstetrics negative OB ROS                             Anesthesia Physical Anesthesia Plan  ASA: I and emergent  Anesthesia Plan: General   Post-op Pain Management:    Induction: Intravenous  Airway Management Planned: Oral ETT  Additional Equipment:   Intra-op Plan:   Post-operative Plan: Extubation in OR  Informed Consent:   Plan Discussed with: Surgeon  Anesthesia Plan Comments:         Anesthesia Quick Evaluation

## 2015-08-21 NOTE — Op Note (Signed)
OPERATIVE NOTE  PATIENT: Rein Reining DATE: 08/21/15   Preop Diagnosis: vaginal cuff dehiscence  Postoperative Diagnosis: same  Surgery: examination under anesthesia, repair and revision of vaginal cuff  Surgeons:  Donaciano Eva, MD Assistant: none  Anesthesia: General   Estimated blood loss: <52ml  IVF:  106ml   Urine output: [id number] ml   Complications: None   Pathology: vaginal cuff    Operative findings: A 3cm defect in the left sided vaginal cuff was noted. Non bleeding. No evidence for abscess or hematoma.  Procedure: The patient was identified in the preoperative holding area. Informed consent was signed on the chart. Patient was seen history was reviewed and exam was performed.   The patient was then taken to the operating room and placed in the supine position with SCD hose on. General anesthesia was then induced without difficulty. She was then placed in the dorsolithotomy position. The perineum was prepped with Betadine. The vagina was very carefully prepped with Betadine in the distal portion. IV clinda and flagyl were administered. The patient was then draped after the prep was dried. A foley catheter was placed under sterile conditions.  Timeout was performed the patient, procedure, antibiotic, allergy, and length of procedure.   The weighted speculum was placed in the posterior vagina. The right angle retractor was placed anteriorally to visualize the vaginal cuff. The defect was appreciated. Using metzenbaum scissors the remainder of the cuff was opened in its entirety. Copious irrigation with 1000cc of saline took place of the  Cul de sac and vagina. The vaginal cuff edges anteriorally and posteriorally were grasped with allis clamps and the bladder was dissected from the anterior vaginal cuff with sharp metzenbaum scissor dissection. The rectum was ensured to be not in close proximity to the posterior vaginal cuff.   Metzenbaum scissors and a  15 blade scalpel were used to sharply resect the distal 48mm of vaginal cuff circumferentially. This was sent for pathology. There was brisk bleeding from the cut edges.   A sponge stick was used to gently displace the sigmoid colon into the peritoneal cavity and away from the cuff. The vaginal cuff was then closed using 0-PDS. A total of 5 interrupted figure of 8 sutures were used to close the cuff. The left and right vaginal angles were closed first to ensure adequate visualization of these regions. The tied suture was held with hemostats for manipulation. The remainder of the cuff was closed with an additional 3 figure of 8 0-PDS sutures ensuring the posterior peritoneum was included and the vaginal cuff edges were everted.   After cuff closure a digital exam confirmed an intact cuff.  No bleeding was present.  Copious irrigation of the vagina again took place.   All instrument, suture, laparotomy, Ray-Tec, and needle counts were correct x2. The patient tolerated the procedure well and was taken recovery room in stable condition. This is Everitt Amber dictating an operative note on Denise Avila.  Donaciano Eva, MD

## 2015-08-21 NOTE — ED Notes (Signed)
Per Dr. Denman George, OR is ready and waiting. Consent and OR checklist are complete

## 2015-08-21 NOTE — ED Notes (Signed)
Initial order for 0.5mg  IV Dilaudid was pulled from pyxis without a waste. Patient did not have IV access so provider changed the order to 1mg  IM Dilaudid. Epic & Pyxis were not communicating due to downtime of Pyxis and 1mg  IM dose was not available to obtain. Patient received 1mg  IM Dilaudid. No waste to document as patient received the entire 1mg  of medication.

## 2015-08-21 NOTE — H&P (Addendum)
H&P: Gyn-Onc  Consult was requested by ER for the evaluation of Denise Avila 39 y.o. female with vaginal bleeding and pelvic pain 3 months after hysterectomy  CC:  Chief Complaint  Patient presents with  . Vaginal Bleeding  . Abdominal Pain    Assessment/Plan:  Ms. Denise Avila  is a 39 y.o.  year old with vaginal cuff dehiscence 3 months s/p robotic assisted total hysterectomy.  Recommend examination under anesthesia and vaginal cuff closure/revision.  I discussed the diagnosis with the patient and the anticipated procedure.  I discussed that there is a small possibility for requiring laparotomy to repair the vagina.  NPO,  Clinda Flagyl on call to OR.   HPI: Denise Avila is a 39 year old woman with a history of chronic pelvic pain and endometriosis. She is 3 months s/p robotic assisted total hysterectomy, BSO for pelvic pain on 05/21/15. Intraoperative findings were unremarkable and her surgery was uncomplicated with minimal EBL.  Postoperatively she experienced increased postop discomfort than what is typically encountered and has had problems with constipation postop.  Her pathology revealed an endometriotic cyst on the right and a small endometrioma implant on the left.   She began experiencing pelvic pain and discomfort on 08/20/15 and had emesis. She began passing blood vaginally (period volume) and with increasing pain she presented to the [facility] ED in the night of 11/17-11/18.  CT scan revealed stool filled colon, but no other abnormalities. No free air. No pelvic abscess or collection.  Labs were unremarkable.   The patient reports her last episode of intercourse was 2 weeks ago and denies placing other items in the vagina.   Current Meds: No current facility-administered medications on file prior to encounter.   Current Outpatient Prescriptions on File Prior to Encounter  Medication Sig Dispense Refill  . citalopram (CELEXA) 20 MG tablet Take 20 mg by  mouth at bedtime.    . clonazePAM (KLONOPIN) 0.5 MG tablet Take 0.5 mg by mouth 2 (two) times daily as needed for anxiety.    . cyclobenzaprine (FLEXERIL) 10 MG tablet Take 10 mg by mouth 2 (two) times daily.   0  . diclofenac (VOLTAREN) 75 MG EC tablet Take 75 mg by mouth 2 (two) times daily.    Marland Kitchen docusate sodium (COLACE) 100 MG capsule Take 300 mg by mouth daily.     Marland Kitchen estrogens, conjugated, (PREMARIN) 0.625 MG tablet Take 1 tablet (0.625 mg total) by mouth daily. Take daily for 21 days then do not take for 7 days. 30 tablet 6  . ibuprofen (ADVIL,MOTRIN) 800 MG tablet Take 1 tablet (800 mg total) by mouth every 8 (eight) hours as needed (mild pain). 30 tablet 0  . phenazopyridine (PYRIDIUM) 100 MG tablet Take 1 tablet (100 mg total) by mouth 3 (three) times daily with meals. (Patient taking differently: Take 100 mg by mouth 3 (three) times daily as needed for pain. ) 10 tablet 0  . traZODone (DESYREL) 50 MG tablet Take 100 mg by mouth at bedtime.     . diphenhydrAMINE (BENADRYL) 25 mg capsule Take 1 capsule (25 mg total) by mouth every 6 (six) hours as needed for itching. (Patient not taking: Reported on 08/21/2015) 30 capsule 0  . estradiol (ESTRACE) 0.5 MG tablet Take 1 tablet (0.5 mg total) by mouth daily. (Patient not taking: Reported on 08/21/2015) 306 tablet 6  . gabapentin (NEURONTIN) 300 MG capsule Take 1 capsule (300 mg total) by mouth 3 (three) times daily. (Patient not taking: Reported  on 08/21/2015) 42 capsule 1  . HYDROcodone-acetaminophen (NORCO/VICODIN) 5-325 MG per tablet Take 1-2 tablets by mouth every 4 (four) hours as needed for moderate pain or severe pain. (Patient not taking: Reported on 08/21/2015) 30 tablet 0  . magnesium citrate solution Take 296 mLs by mouth once. (Patient not taking: Reported on 08/21/2015) 300 mL 1  . ondansetron (ZOFRAN ODT) 8 MG disintegrating tablet Take 1 tablet (8 mg total) by mouth every 8 (eight) hours as needed for nausea or vomiting. (Patient not  taking: Reported on 08/21/2015) 20 tablet 0  . ondansetron (ZOFRAN) 4 MG tablet Take 1 tablet (4 mg total) by mouth every 6 (six) hours as needed for nausea. (Patient not taking: Reported on 08/21/2015) 20 tablet 0  . oxyCODONE-acetaminophen (PERCOCET/ROXICET) 5-325 MG per tablet Take 1-2 tablets by mouth every 4 (four) hours as needed (moderate to severe pain). (Patient not taking: Reported on 08/21/2015) 30 tablet 0  . sucralfate (CARAFATE) 1 G tablet Take 1 tablet (1 g total) by mouth 4 (four) times daily. (Patient not taking: Reported on 08/21/2015) 30 tablet 0  . tamsulosin (FLOMAX) 0.4 MG CAPS capsule Take 1 capsule (0.4 mg total) by mouth daily after supper. (Patient not taking: Reported on 08/21/2015) 30 capsule 0     Allergy:  Allergies  Allergen Reactions  . Ciprofloxacin Diarrhea and Nausea Only  . Penicillins Swelling and Rash    Has patient had a PCN reaction causing immediate rash, facial/tongue/throat swelling, SOB or lightheadedness with hypotension: Yes Has patient had a PCN reaction causing severe rash involving mucus membranes or skin necrosis: Yes Has patient had a PCN reaction that required hospitalization No  Lips swelling Has patient had a PCN reaction occurring within the last 10 years: Yes If all of the above answers are "NO", then may proceed with Cephalosporin use.     Social Hx:   Social History   Social History  . Marital Status: Married    Spouse Name: N/A  . Number of Children: N/A  . Years of Education: N/A   Occupational History  . Not on file.   Social History Main Topics  . Smoking status: Never Smoker   . Smokeless tobacco: Never Used  . Alcohol Use: No  . Drug Use: No  . Sexual Activity: Yes   Other Topics Concern  . Not on file   Social History Narrative    Past Surgical Hx:  Past Surgical History  Procedure Laterality Date  . Ablation      uterine  . Dilation and curettage of uterus    . Cystoscopy w/ ureteral stent  placement Left 02/02/2015    Procedure: CYSTOSCOPY WITH RETROGRADE PYELOGRAM/URETERAL STENT PLACEMENT;  Surgeon: Festus Aloe, MD;  Location: [facility];  Service: Urology;  Laterality: Left;  . Laparoscopy      x 4  . Cystoscopy with ureteroscopy and stent placement Left 02/13/2015    Procedure: LEFT URETEROSCOPY WITH HOLMIUM LASER AND STENT PLACEMENT;  Surgeon: Festus Aloe, MD;  Location: [facility];  Service: Urology;  Laterality: Left;  . Holmium laser application N/A [id number]    Procedure: HOLMIUM LASER APPLICATION;  Surgeon: Festus Aloe, MD;  Location: [facility];  Service: Urology;  Laterality: N/A;  . Melanoma excision  2000    melanoma removed from face, basal cell removed from nose  . Robotic assisted total hysterectomy with bilateral salpingo oopherectomy Bilateral 05/21/2015    Procedure: XI ROBOTIC ASSISTED TOTAL HYSTERECTOMY WITH BILATERAL SALPINGO OOPHORECTOMY;  Surgeon: Everitt Amber,  MD;  Location: [facility];  Service: Gynecology;  Laterality: Bilateral;    Past Medical Hx:  Past Medical History  Diagnosis Date  . Endometriosis   . Migraines   . History of kidney stones   . Pleurisy 02/2015    while hospitalized   . Fibroids     age 15  . Abnormal Pap smear of cervix 12/2014  . Skin cancer [place]) [place])   . Depression     Past Gynecological History:  Hysterectomy for endometriosis on 05/20/17. On HRT  Patient's last menstrual period was 05/04/2015 (approximate).   Family Hx:  Family History  Problem Relation Age of Onset  . Hypertension Mother   . Cancer - Other Father     liver  . Diabetes Mellitus II Maternal Grandmother   . Heart disease Maternal Grandmother   . Cancer - Lung Maternal Grandfather   . Osteoporosis Paternal Grandmother   . Melanoma Paternal Grandfather     Review of Systems:  Constitutional  Feels generally unwell with pain  ENT Normal appearing ears and nares bilaterally Skin/Breast  No rash, sores, jaundice, itching,  dryness Cardiovascular  No chest pain, shortness of breath, or edema  Pulmonary  No cough or wheeze.  Gastro Intestinal  + nausea, vomitting, or diarrhoea. No bright red blood per rectum, +abdominal pain, denies change in bowel movement, or constipation.  Genito Urinary  No frequency, urgency, dysuria, + leakage of blood vaginally Musculo Skeletal  No myalgia, arthralgia, joint swelling or pain  Neurologic  No weakness, numbness, change in gait,  Psychology  No depression, anxiety, insomnia.   Vitals:  Blood pressure 111/78, pulse 82, temperature 97.4 F (36.3 C), temperature source Oral, resp. rate 16, last menstrual period 05/04/2015, SpO2 99 %.  Physical Exam: WD in NAD Neck  Supple NROM, without any enlargements.  Lymph Node Survey No cervical supraclavicular or inguinal adenopathy Cardiovascular  Pulse normal rate, regularity and rhythm. S1 and S2 normal.  Lungs  Clear to auscultation bilateraly, without wheezes/crackles/rhonchi. Good air movement.  Skin  No rash/lesions/breakdown  Psychiatry  Alert and oriented to person, place, and time  Abdomen  Normoactive bowel sounds, abdomen soft, non-tender and obese without evidence of hernia.  Back No CVA tenderness Genito Urinary  Vulva/vagina: Normal external female genitalia.  No lesions. No discharge or bleeding.  Bladder/urethra:  No lesions or masses, well supported bladder  Vagina: No blood in vault. On speculum exam there is a friable 0.5cm piece of tissue at the vaginal apex. No intestines within the vault. On bimanual exam, there is tenderness at the vaginal apex. There is a 3cm defect at the left vaginal apex and digital palpation of this causes passage of fluid from the vagina.  Cervix: surgically absent  Uterus: surgically absent Small, mobile, no parametrial involvement or nodularity.  Adnexa: no palpable masses. Rectal  deferred Extremities  No bilateral cyanosis, clubbing or edema.   Donaciano Eva, MD  08/21/2015, 6:02 AM

## 2015-08-22 LAB — CBC
HCT: 33.4 % — ABNORMAL LOW (ref 36.0–46.0)
Hemoglobin: 11.1 g/dL — ABNORMAL LOW (ref 12.0–15.0)
MCH: 30.2 pg (ref 26.0–34.0)
MCHC: 33.2 g/dL (ref 30.0–36.0)
MCV: 91 fL (ref 78.0–100.0)
PLATELETS: 151 10*3/uL (ref 150–400)
RBC: 3.67 MIL/uL — ABNORMAL LOW (ref 3.87–5.11)
RDW: 13.1 % (ref 11.5–15.5)
WBC: 7.8 10*3/uL (ref 4.0–10.5)

## 2015-08-22 MED ORDER — OXYCODONE-ACETAMINOPHEN 5-325 MG PO TABS: 1.0000 | ORAL_TABLET | ORAL | Status: DC | PRN

## 2015-08-22 NOTE — Discharge Summary (Signed)
Physician Discharge Summary  Patient ID: Denise Avila MRN: [id number] DOB/AGE: 01-19-1976 39 y.o.  Admit date: 08/20/2015 Discharge date: 08/22/2015  Admission Diagnoses: Vaginal cuff dehiscence  Discharge Diagnoses:  Principal Problem:   Vaginal cuff dehiscence Active Problems:   Vaginal bleeding   Discharged Condition: good  Hospital Course: She presented to the ED with abdominal pain and vaginal bleeding.  She is 3 months s/p a robotic assisted total hysterectomy, BSO.  On 08/20/2015 - 08/21/2015, the patient underwent the following: Procedure(s): REPAIR VAGINAL CUFF, EXAM UNDER ANESTHESIA.   The postoperative course was uneventful.  She was discharged to home on postoperative day 1 tolerating a regular diet.  Consults: None  Significant Diagnostic Studies: radiology: CT scan: no acute findings  Treatments: surgery: see above  Discharge Exam: Blood pressure 101/61, pulse 57, temperature 98.2 F (36.8 C), temperature source Oral, resp. rate 15, last menstrual period 05/04/2015, SpO2 97 %. General appearance: alert Resp: clear to auscultation bilaterally Cardio: regular rate and rhythm, S1, S2 normal, no murmur, click, rub or gallop GI: soft, non-tender; bowel sounds normal; no masses,  no organomegaly Extremities: extremities normal, atraumatic, no cyanosis or edema PV loss: none  Disposition: 01-Home or Self Care  Discharge Instructions    Activity as tolerated - No restrictions    Complete by:  As directed      Call MD for:  extreme fatigue    Complete by:  As directed      Call MD for:  persistant dizziness or light-headedness    Complete by:  As directed      Call MD for:  persistant nausea and vomiting    Complete by:  As directed      Call MD for:  severe uncontrolled pain    Complete by:  As directed      Call MD for:  temperature >100.4    Complete by:  As directed      Diet general    Complete by:  As directed      Driving Restrictions     Complete by:  As directed   No driving while taking narcotics     Increase activity slowly    Complete by:  As directed      Lifting restrictions    Complete by:  As directed   No lifting > 5 lbs for 6 weeks     May shower / Bathe    Complete by:  As directed   No tub baths for 6 weeks     May walk up steps    Complete by:  As directed      Sexual Activity Restrictions    Complete by:  As directed   No intercourse for 12 weeks            Medication List    TAKE these medications        citalopram 20 MG tablet  Commonly known as:  CELEXA  Take 20 mg by mouth at bedtime.     clonazePAM 0.5 MG tablet  Commonly known as:  KLONOPIN  Take 0.5 mg by mouth 2 (two) times daily as needed for anxiety.     cyclobenzaprine 10 MG tablet  Commonly known as:  FLEXERIL  Take 10 mg by mouth 2 (two) times daily.     diclofenac 75 MG EC tablet  Commonly known as:  VOLTAREN  Take 75 mg by mouth 2 (two) times daily.     diphenhydrAMINE 25 mg capsule  Commonly known  as:  BENADRYL  Take 1 capsule (25 mg total) by mouth every 6 (six) hours as needed for itching.     docusate sodium 100 MG capsule  Commonly known as:  COLACE  Take 300 mg by mouth daily.     estradiol 0.5 MG tablet  Commonly known as:  ESTRACE  Take 1 tablet (0.5 mg total) by mouth daily.     estrogens (conjugated) 0.625 MG tablet  Commonly known as:  PREMARIN  Take 1 tablet (0.625 mg total) by mouth daily. Take daily for 21 days then do not take for 7 days.     gabapentin 300 MG capsule  Commonly known as:  NEURONTIN  Take 1 capsule (300 mg total) by mouth 3 (three) times daily.     HYDROcodone-acetaminophen 5-325 MG tablet  Commonly known as:  NORCO/VICODIN  Take 1-2 tablets by mouth every 4 (four) hours as needed for moderate pain or severe pain.     ibuprofen 800 MG tablet  Commonly known as:  ADVIL,MOTRIN  Take 1 tablet (800 mg total) by mouth every 8 (eight) hours as needed (mild pain).     magnesium  citrate solution  Take 296 mLs by mouth once.     multivitamin with minerals Tabs tablet  Take 1 tablet by mouth daily. Occuvite eye + multi     omeprazole 20 MG capsule  Commonly known as:  PRILOSEC  Take 20 mg by mouth daily.     ondansetron 4 MG tablet  Commonly known as:  ZOFRAN  Take 1 tablet (4 mg total) by mouth every 6 (six) hours as needed for nausea.     ondansetron 8 MG disintegrating tablet  Commonly known as:  ZOFRAN ODT  Take 1 tablet (8 mg total) by mouth every 8 (eight) hours as needed for nausea or vomiting.     oxyCODONE-acetaminophen 5-325 MG tablet  Commonly known as:  PERCOCET/ROXICET  Take 1-2 tablets by mouth every 4 (four) hours as needed for severe pain.     phenazopyridine 100 MG tablet  Commonly known as:  PYRIDIUM  Take 1 tablet (100 mg total) by mouth 3 (three) times daily with meals.     sucralfate 1 G tablet  Commonly known as:  CARAFATE  Take 1 tablet (1 g total) by mouth 4 (four) times daily.     tamsulosin 0.4 MG Caps capsule  Commonly known as:  FLOMAX  Take 1 capsule (0.4 mg total) by mouth daily after supper.     traZODone 50 MG tablet  Commonly known as:  DESYREL  Take 100 mg by mouth at bedtime.           Follow-up Information    Follow up with Donaciano Eva, MD In 2 weeks.   Specialty:  Obstetrics and Gynecology   Why:  postop f/u   Contact information:   [place] [address] [phone]       Signed: Agnes Lawrence 08/22/2015, 9:49 AM

## 2015-08-22 NOTE — Discharge Instructions (Signed)
Preventing Constipation After Surgery Constipation is when a person has fewer than 3 bowel movements a week; has difficulty having a bowel movement; or has stools that are dry, hard, or larger than normal. Many things can make constipation likely after surgery. They include:  Medicines, especially numbing medicines (anesthetics) and very strong pain medicines called narcotics.  Feeling stressed because of the surgery.  Eating different foods than normal.  Being less active. Symptoms of constipation include:  Having fewer than 3 bowel movements a week.  Straining to have a bowel movement.  Having hard, dry, or larger-than-normal stools.  Feeling full or bloated.  Having pain in the lower abdomen.  Not feeling relief after having a bowel movement. HOME CARE INSTRUCTIONS  Diet  Eat foods that have a lot of fiber. These include fruits, vegetables, whole grains, and beans. Limit foods high in fat and processed sugars. These include french fries, hamburgers, cookies, and candy.  Take a fiber supplement as directed. If you are not taking a fiber supplement and think that you are not getting enough fiber from foods, talk to your health care provider about adding a fiber supplement to your diet.  Drink clear fluids, especially water. Avoid drinking alcohol, caffeine, and soda. These can make constipation worse.  Drink enough fluids to keep your urine clear or pale yellow. Activity   After surgery, return to your normal activities slowly or when your health care provider says it is okay.  Start walking as soon as you can. Try to go a little farther each day.  Once your health care provider approves, do some sort of regular exercise. This helps prevent constipation. Bowel Movements  Go to the restroom when you have the urge to go. Do not hold it in.  Try drinking something hot to get a bowel movement started.  Keep track of how often you use the restroom. If you miss 2-3 bowel  movements, talk to your health care provider about medicines that prevent constipation. Your health care provider may suggest a stool softener, laxative, or fiber supplement.  Only take over-the-counter or prescription medicines as directed by your health care provider.  Do not take other medicines without talking to your health care provider first. If you become constipated and take a medicine to make you have a bowel movement, the problem may get worse. Other kinds of medicine can also make the problem worse. SEEK MEDICAL CARE IF:  You used stool softeners or laxatives and still have not had a bowel movement within 24-48 hours after using them.  You have not had a bowel movement in 3 days. SEEK IMMEDIATE MEDICAL CARE IF:   Your constipation lasts for more than 4 days or gets worse.  You have bright red blood in your stool.  You have abdominal or rectal pain.  You have very bad cramping.  You have thin, pencil-like stools.  You have unexplained weight loss.  You have a fever or persistent symptoms for more than 2-3 days.  You have a fever and your symptoms suddenly get worse.   This information is not intended to replace advice given to you by your health care provider. Make sure you discuss any questions you have with your health care provider.   Document Released: 01/14/2013 Document Revised: 10/10/2014 Document Reviewed: 01/14/2013 Elsevier Interactive Patient Education [employer].

## 2015-08-24 ENCOUNTER — Telehealth: Payer: Self-pay | Admitting: Gynecologic Oncology

## 2015-08-24 NOTE — Telephone Encounter (Signed)
Called to enquire about recovery. Left message. I will attempt contact tomorrow.  Donaciano Eva, MD

## 2015-08-26 ENCOUNTER — Other Ambulatory Visit: Payer: Self-pay | Admitting: Gynecologic Oncology

## 2015-08-26 MED ORDER — PEG 3350-KCL-NABCB-NACL-NASULF 236 G PO SOLR: 4000.0000 mL | Freq: Once | ORAL | Status: DC

## 2015-09-18 ENCOUNTER — Ambulatory Visit: Payer: BLUE CROSS/BLUE SHIELD | Attending: Gynecologic Oncology | Admitting: Gynecologic Oncology

## 2015-09-18 ENCOUNTER — Encounter: Payer: Self-pay | Admitting: Gynecologic Oncology

## 2015-09-18 VITALS — BP 101/74 | HR 67 | Temp 97.7°F | Resp 18 | Ht 72.0 in | Wt 211.5 lb

## 2015-09-18 DIAGNOSIS — N801 Endometriosis of ovary: Secondary | ICD-10-CM

## 2015-09-18 DIAGNOSIS — N80109 Endometriosis of ovary, unspecified side, unspecified depth: Secondary | ICD-10-CM

## 2015-09-18 DIAGNOSIS — K5901 Slow transit constipation: Secondary | ICD-10-CM | POA: Insufficient documentation

## 2015-09-18 MED ORDER — PEG 3350-KCL-NABCB-NACL-NASULF 236 G PO SOLR: 4000.0000 mL | Freq: Once | ORAL | Status: DC

## 2015-09-18 NOTE — Patient Instructions (Signed)
We will call you with an appt to meet with a gastroenterologist at [facility].  Follow up with Dr. Denman George on an as needed basis.  Refill on Golytely has been sent to your pharmacy.

## 2015-09-18 NOTE — Progress Notes (Signed)
POSTOPERATIVE FOLLOWUP  Assessment:    39 y.o. year old with history of endometriosis s/p robotic hyst, bso on 99991111 complicated by vaginal cuff dehiscence, s/p repair of vaginal cuff on 08/21/15. Chronic constipation.  Plan: 1) Vaginal cuff healing well. Recommended no intercourse for 8-10 weeks post repair. Discussed that delayed absorbable sutures were used and this will be associated with vaginal discharge. 2) Chronic constipation: patient reports it was present from May, 2016 - prior to her original surgery. Not on narcotics. No evidence of obstruction on imaging.  Will give go lytely again (as this helped her last time) and referral to GI for evaluation for a potential functional cause. I do not believe it is related to her surgery.  3)  F/U: Return to clinic on a prn basis only. She will return to Dr Nelda Marseille for annual well woman care.    HPI:  Denise Avila is a 39 y.o. year old No obstetric history on file. initially seen in consultation on 05/15/15 for abnormal uterine bleeding and endometroisis pain refractory to medical therapies.  She then underwent a robotic assisted TLH, BSO on 99991111 without complications.  Her postoperative course was uncomplicated.  Her final pathology revealed endometriosis of the right ovary. Interestingly, intraoperatively, her surgical findings were unremarkable - there was no adhesive disease and only a 1cm powderburn lesion on the left ovary, but no other abnormalities.  On 08/21/15 she presented to the [facility] ED (3 months after her hysterectomy) for vaginal discharge and pelvic pain. A CT of the abdomen showed a colon full of stool, but no obstruction or abnormalities. On physical exam she had a vaginal cuff dehiscence without evisceration. She was taken to the OR that day for a vaginal revision of the cuff and primary closure with delayed absorbable suture.  She is seen today for a postoperative check. Since her second surgery she has been doing well. She has  light spotting occasionally. Her main issue has been progressive constipation. She reports she has had worsening constipation since May, 2016 (before hysterectomy). She passes flatus and has no emesis, but feels bloated and full. She tries daily colace and miralax with no relief. She has tried milk of mag and mag citrate with no relief. She had relief 1 week ago with half a gallon of golytely.    Review of systems: Constitutional:  She has no weight gain or weight loss. She has no fever or chills. Eyes: No blurred vision Ears, Nose, Mouth, Throat: No dizziness, headaches or changes in hearing. No mouth sores. Cardiovascular:  no palpitations or edema. Respiratory:  No shortness of breath, wheezing or cough Gastrointestinal: + constipation and bloating. Genitourinary:  She denies pelvic pain, pelvic pressure or changes in her urinary function. She has no hematuria, dysuria, or incontinence. + vaginal spotting postop Musculoskeletal: Denies muscle weakness or joint pains.  Skin:  She has no skin changes, rashes or itching Neurological:  Denies dizziness or headaches. No neuropathy, no numbness or tingling. Psychiatric:  She denies depression or anxiety. Hematologic/Lymphatic:   No easy bruising or bleeding   Physical Exam: Blood pressure 101/74, pulse 67, temperature 97.7 F (36.5 C), temperature source Oral, resp. rate 18, height 6' (1.829 m), weight 211 lb 8 oz (95.936 kg), last menstrual period 05/04/2015, SpO2 100 %. General: Well dressed, well nourished in no apparent distress.   HEENT:  Normocephalic and atraumatic, no lesions.  Extraocular muscles intact. Sclerae anicteric. Pupils equal, round, reactive. No mouth sores or ulcers. Thyroid is normal size,  not nodular, midline. Skin:  No lesions or rashes. Abdomen:  Soft, nontender, nondistended.  No palpable masses.  No hepatosplenomegaly.  No ascites. Normal bowel sounds.  No hernias.  Incisions are well healed.  Genitourinary: Normal  EGBUS  Vaginal cuff intact.  No bleeding or discharge.  No cul de sac fullness. Sutures present/ Extremities: No cyanosis, clubbing or edema.  No calf tenderness or erythema. No palpable cords. Psychiatric: Mood and affect are appropriate. Neurological: Awake, alert and oriented x 3. Sensation is intact, no neuropathy.  Musculoskeletal: No pain, normal strength and range of motion.  Donaciano Eva, MD

## 2015-09-23 ENCOUNTER — Other Ambulatory Visit: Payer: Self-pay | Admitting: Gynecologic Oncology

## 2015-09-25 ENCOUNTER — Ambulatory Visit
Admission: RE | Admit: 2015-09-25 | Discharge: 2015-09-25 | Disposition: A | Discharge status: Home / Self Care | Payer: BLUE CROSS/BLUE SHIELD | Source: Ambulatory Visit | Attending: Physician Assistant | Admitting: Physician Assistant

## 2015-09-25 ENCOUNTER — Other Ambulatory Visit: Payer: Self-pay | Admitting: Physician Assistant

## 2015-09-25 DIAGNOSIS — K5909 Other constipation: Secondary | ICD-10-CM

## 2015-09-25 DIAGNOSIS — K56609 Unspecified intestinal obstruction, unspecified as to partial versus complete obstruction: Secondary | ICD-10-CM

## 2015-11-25 ENCOUNTER — Ambulatory Visit
Admission: RE | Admit: 2015-11-25 | Discharge: 2015-11-25 | Disposition: A | Discharge status: Home / Self Care | Payer: BLUE CROSS/BLUE SHIELD | Source: Ambulatory Visit | Attending: Physician Assistant | Admitting: Physician Assistant

## 2015-11-25 ENCOUNTER — Other Ambulatory Visit: Payer: Self-pay | Admitting: Physician Assistant

## 2015-11-25 DIAGNOSIS — K5900 Constipation, unspecified: Secondary | ICD-10-CM

## 2015-11-27 ENCOUNTER — Ambulatory Visit
Admission: RE | Admit: 2015-11-27 | Discharge: 2015-11-27 | Disposition: A | Discharge status: Home / Self Care | Payer: BLUE CROSS/BLUE SHIELD | Source: Ambulatory Visit | Attending: Physician Assistant | Admitting: Physician Assistant

## 2015-11-27 ENCOUNTER — Other Ambulatory Visit: Payer: Self-pay | Admitting: Physician Assistant

## 2015-11-27 DIAGNOSIS — K5900 Constipation, unspecified: Secondary | ICD-10-CM

## 2015-12-14 ENCOUNTER — Other Ambulatory Visit: Payer: Self-pay | Admitting: Physician Assistant

## 2015-12-14 ENCOUNTER — Ambulatory Visit
Admission: RE | Admit: 2015-12-14 | Discharge: 2015-12-14 | Disposition: A | Discharge status: Home / Self Care | Payer: BLUE CROSS/BLUE SHIELD | Source: Ambulatory Visit | Attending: Physician Assistant | Admitting: Physician Assistant

## 2015-12-14 DIAGNOSIS — R140 Abdominal distension (gaseous): Secondary | ICD-10-CM

## 2015-12-14 DIAGNOSIS — K5900 Constipation, unspecified: Secondary | ICD-10-CM

## 2015-12-14 DIAGNOSIS — R109 Unspecified abdominal pain: Secondary | ICD-10-CM

## 2016-02-03 DIAGNOSIS — R140 Abdominal distension (gaseous): Secondary | ICD-10-CM | POA: Diagnosis not present

## 2016-02-03 DIAGNOSIS — R112 Nausea with vomiting, unspecified: Secondary | ICD-10-CM | POA: Diagnosis not present

## 2016-02-03 DIAGNOSIS — K5909 Other constipation: Secondary | ICD-10-CM | POA: Diagnosis not present

## 2016-02-03 DIAGNOSIS — Z6830 Body mass index (BMI) 30.0-30.9, adult: Secondary | ICD-10-CM | POA: Diagnosis not present

## 2016-02-03 DIAGNOSIS — K599 Functional intestinal disorder, unspecified: Secondary | ICD-10-CM | POA: Diagnosis not present

## 2016-02-09 DIAGNOSIS — R112 Nausea with vomiting, unspecified: Secondary | ICD-10-CM | POA: Diagnosis not present

## 2016-02-11 DIAGNOSIS — R112 Nausea with vomiting, unspecified: Secondary | ICD-10-CM | POA: Diagnosis not present

## 2016-03-29 DIAGNOSIS — L259 Unspecified contact dermatitis, unspecified cause: Secondary | ICD-10-CM | POA: Diagnosis not present

## 2016-04-06 DIAGNOSIS — K5900 Constipation, unspecified: Secondary | ICD-10-CM | POA: Diagnosis not present

## 2016-04-06 DIAGNOSIS — R109 Unspecified abdominal pain: Secondary | ICD-10-CM | POA: Diagnosis not present

## 2016-04-06 DIAGNOSIS — R140 Abdominal distension (gaseous): Secondary | ICD-10-CM | POA: Diagnosis not present

## 2016-05-02 DIAGNOSIS — K5901 Slow transit constipation: Secondary | ICD-10-CM | POA: Diagnosis not present

## 2016-05-19 DIAGNOSIS — K5901 Slow transit constipation: Secondary | ICD-10-CM | POA: Diagnosis not present

## 2016-05-19 DIAGNOSIS — Z6832 Body mass index (BMI) 32.0-32.9, adult: Secondary | ICD-10-CM | POA: Diagnosis not present

## 2016-05-19 DIAGNOSIS — Z90722 Acquired absence of ovaries, bilateral: Secondary | ICD-10-CM | POA: Diagnosis not present

## 2016-05-19 DIAGNOSIS — Z87442 Personal history of urinary calculi: Secondary | ICD-10-CM | POA: Diagnosis not present

## 2016-05-19 DIAGNOSIS — K598 Other specified functional intestinal disorders: Secondary | ICD-10-CM | POA: Diagnosis not present

## 2016-05-19 DIAGNOSIS — Z01818 Encounter for other preprocedural examination: Secondary | ICD-10-CM | POA: Diagnosis not present

## 2016-05-19 DIAGNOSIS — K5900 Constipation, unspecified: Secondary | ICD-10-CM | POA: Diagnosis not present

## 2016-05-19 DIAGNOSIS — R112 Nausea with vomiting, unspecified: Secondary | ICD-10-CM | POA: Diagnosis not present

## 2016-05-24 DIAGNOSIS — K5900 Constipation, unspecified: Secondary | ICD-10-CM | POA: Diagnosis not present

## 2016-05-24 DIAGNOSIS — K5901 Slow transit constipation: Secondary | ICD-10-CM | POA: Diagnosis not present

## 2016-05-24 DIAGNOSIS — N289 Disorder of kidney and ureter, unspecified: Secondary | ICD-10-CM | POA: Diagnosis not present

## 2016-05-24 DIAGNOSIS — D1809 Hemangioma of other sites: Secondary | ICD-10-CM | POA: Diagnosis not present

## 2016-05-25 DIAGNOSIS — K598 Other specified functional intestinal disorders: Secondary | ICD-10-CM | POA: Diagnosis not present

## 2016-05-25 DIAGNOSIS — K5900 Constipation, unspecified: Secondary | ICD-10-CM | POA: Diagnosis not present

## 2016-05-25 DIAGNOSIS — K5909 Other constipation: Secondary | ICD-10-CM | POA: Diagnosis not present

## 2016-05-25 DIAGNOSIS — K5901 Slow transit constipation: Secondary | ICD-10-CM | POA: Diagnosis not present

## 2016-05-25 DIAGNOSIS — Z880 Allergy status to penicillin: Secondary | ICD-10-CM | POA: Diagnosis not present

## 2016-05-26 DIAGNOSIS — K598 Other specified functional intestinal disorders: Secondary | ICD-10-CM | POA: Diagnosis not present

## 2016-05-28 DIAGNOSIS — K598 Other specified functional intestinal disorders: Secondary | ICD-10-CM | POA: Diagnosis not present

## 2016-06-09 DIAGNOSIS — G43919 Migraine, unspecified, intractable, without status migrainosus: Secondary | ICD-10-CM | POA: Diagnosis not present

## 2016-06-10 DIAGNOSIS — R112 Nausea with vomiting, unspecified: Secondary | ICD-10-CM | POA: Diagnosis not present

## 2016-06-10 DIAGNOSIS — R197 Diarrhea, unspecified: Secondary | ICD-10-CM | POA: Diagnosis not present

## 2016-06-10 DIAGNOSIS — R188 Other ascites: Secondary | ICD-10-CM | POA: Diagnosis not present

## 2016-06-10 DIAGNOSIS — R9431 Abnormal electrocardiogram [ECG] [EKG]: Secondary | ICD-10-CM | POA: Diagnosis not present

## 2016-06-10 DIAGNOSIS — K429 Umbilical hernia without obstruction or gangrene: Secondary | ICD-10-CM | POA: Diagnosis not present

## 2016-06-12 DIAGNOSIS — G939 Disorder of brain, unspecified: Secondary | ICD-10-CM | POA: Diagnosis not present

## 2016-06-12 DIAGNOSIS — G43919 Migraine, unspecified, intractable, without status migrainosus: Secondary | ICD-10-CM | POA: Diagnosis not present

## 2016-06-12 DIAGNOSIS — R160 Hepatomegaly, not elsewhere classified: Secondary | ICD-10-CM | POA: Diagnosis not present

## 2016-06-12 DIAGNOSIS — G43019 Migraine without aura, intractable, without status migrainosus: Secondary | ICD-10-CM | POA: Diagnosis not present

## 2016-06-12 DIAGNOSIS — K760 Fatty (change of) liver, not elsewhere classified: Secondary | ICD-10-CM | POA: Diagnosis not present

## 2016-06-12 DIAGNOSIS — R51 Headache: Secondary | ICD-10-CM | POA: Diagnosis not present

## 2016-06-13 DIAGNOSIS — R51 Headache: Secondary | ICD-10-CM | POA: Diagnosis not present

## 2016-06-13 DIAGNOSIS — R112 Nausea with vomiting, unspecified: Secondary | ICD-10-CM | POA: Diagnosis not present

## 2016-06-13 DIAGNOSIS — R9431 Abnormal electrocardiogram [ECG] [EKG]: Secondary | ICD-10-CM | POA: Diagnosis not present

## 2016-06-13 DIAGNOSIS — E43 Unspecified severe protein-calorie malnutrition: Secondary | ICD-10-CM | POA: Diagnosis not present

## 2016-06-13 DIAGNOSIS — G43019 Migraine without aura, intractable, without status migrainosus: Secondary | ICD-10-CM | POA: Diagnosis not present

## 2016-06-13 DIAGNOSIS — Z90710 Acquired absence of both cervix and uterus: Secondary | ICD-10-CM | POA: Diagnosis not present

## 2016-06-13 DIAGNOSIS — G43919 Migraine, unspecified, intractable, without status migrainosus: Secondary | ICD-10-CM | POA: Diagnosis not present

## 2016-06-13 DIAGNOSIS — R197 Diarrhea, unspecified: Secondary | ICD-10-CM | POA: Diagnosis not present

## 2016-06-13 DIAGNOSIS — R079 Chest pain, unspecified: Secondary | ICD-10-CM | POA: Diagnosis not present

## 2016-06-13 DIAGNOSIS — I499 Cardiac arrhythmia, unspecified: Secondary | ICD-10-CM | POA: Diagnosis not present

## 2016-06-13 DIAGNOSIS — G43909 Migraine, unspecified, not intractable, without status migrainosus: Secondary | ICD-10-CM | POA: Diagnosis not present

## 2016-06-13 DIAGNOSIS — R110 Nausea: Secondary | ICD-10-CM | POA: Diagnosis not present

## 2016-06-13 DIAGNOSIS — G43009 Migraine without aura, not intractable, without status migrainosus: Secondary | ICD-10-CM | POA: Diagnosis not present

## 2016-06-14 DIAGNOSIS — R079 Chest pain, unspecified: Secondary | ICD-10-CM | POA: Diagnosis not present

## 2016-06-14 DIAGNOSIS — G43909 Migraine, unspecified, not intractable, without status migrainosus: Secondary | ICD-10-CM | POA: Diagnosis not present

## 2016-06-15 DIAGNOSIS — G43919 Migraine, unspecified, intractable, without status migrainosus: Secondary | ICD-10-CM | POA: Diagnosis not present

## 2016-06-15 DIAGNOSIS — I499 Cardiac arrhythmia, unspecified: Secondary | ICD-10-CM | POA: Diagnosis not present

## 2016-06-15 DIAGNOSIS — G43009 Migraine without aura, not intractable, without status migrainosus: Secondary | ICD-10-CM | POA: Diagnosis not present

## 2016-06-16 DIAGNOSIS — G43919 Migraine, unspecified, intractable, without status migrainosus: Secondary | ICD-10-CM | POA: Diagnosis not present

## 2016-06-18 DIAGNOSIS — G43919 Migraine, unspecified, intractable, without status migrainosus: Secondary | ICD-10-CM | POA: Diagnosis not present

## 2016-06-19 DIAGNOSIS — R51 Headache: Secondary | ICD-10-CM | POA: Diagnosis not present

## 2016-06-19 DIAGNOSIS — Z90710 Acquired absence of both cervix and uterus: Secondary | ICD-10-CM | POA: Diagnosis not present

## 2016-06-19 DIAGNOSIS — G43019 Migraine without aura, intractable, without status migrainosus: Secondary | ICD-10-CM | POA: Diagnosis not present

## 2016-06-19 DIAGNOSIS — R9431 Abnormal electrocardiogram [ECG] [EKG]: Secondary | ICD-10-CM | POA: Diagnosis not present

## 2016-07-11 DIAGNOSIS — Z9889 Other specified postprocedural states: Secondary | ICD-10-CM | POA: Diagnosis not present

## 2016-07-11 DIAGNOSIS — Z880 Allergy status to penicillin: Secondary | ICD-10-CM | POA: Diagnosis not present

## 2016-07-11 DIAGNOSIS — Z881 Allergy status to other antibiotic agents status: Secondary | ICD-10-CM | POA: Diagnosis not present

## 2016-07-11 DIAGNOSIS — Z79899 Other long term (current) drug therapy: Secondary | ICD-10-CM | POA: Diagnosis not present

## 2016-07-11 DIAGNOSIS — Z791 Long term (current) use of non-steroidal anti-inflammatories (NSAID): Secondary | ICD-10-CM | POA: Diagnosis not present

## 2016-07-11 DIAGNOSIS — Z9049 Acquired absence of other specified parts of digestive tract: Secondary | ICD-10-CM | POA: Diagnosis not present

## 2016-07-11 DIAGNOSIS — G43909 Migraine, unspecified, not intractable, without status migrainosus: Secondary | ICD-10-CM | POA: Diagnosis not present

## 2016-07-11 DIAGNOSIS — Z980 Intestinal bypass and anastomosis status: Secondary | ICD-10-CM | POA: Diagnosis not present

## 2016-07-20 DIAGNOSIS — G43909 Migraine, unspecified, not intractable, without status migrainosus: Secondary | ICD-10-CM | POA: Diagnosis not present

## 2016-07-20 DIAGNOSIS — Z23 Encounter for immunization: Secondary | ICD-10-CM | POA: Diagnosis not present

## 2016-07-20 DIAGNOSIS — R109 Unspecified abdominal pain: Secondary | ICD-10-CM | POA: Diagnosis not present

## 2016-07-20 DIAGNOSIS — R112 Nausea with vomiting, unspecified: Secondary | ICD-10-CM | POA: Insufficient documentation

## 2016-07-20 DIAGNOSIS — N951 Menopausal and female climacteric states: Secondary | ICD-10-CM | POA: Diagnosis not present

## 2016-07-20 DIAGNOSIS — R609 Edema, unspecified: Secondary | ICD-10-CM | POA: Diagnosis not present

## 2016-07-20 DIAGNOSIS — R1084 Generalized abdominal pain: Secondary | ICD-10-CM | POA: Diagnosis not present

## 2016-07-20 DIAGNOSIS — A09 Infectious gastroenteritis and colitis, unspecified: Secondary | ICD-10-CM | POA: Diagnosis not present

## 2016-07-20 DIAGNOSIS — F419 Anxiety disorder, unspecified: Secondary | ICD-10-CM | POA: Diagnosis not present

## 2016-07-20 DIAGNOSIS — D7389 Other diseases of spleen: Secondary | ICD-10-CM | POA: Diagnosis not present

## 2016-07-20 DIAGNOSIS — Z6834 Body mass index (BMI) 34.0-34.9, adult: Secondary | ICD-10-CM | POA: Diagnosis not present

## 2016-07-20 DIAGNOSIS — Z9049 Acquired absence of other specified parts of digestive tract: Secondary | ICD-10-CM | POA: Diagnosis not present

## 2016-07-20 DIAGNOSIS — R197 Diarrhea, unspecified: Secondary | ICD-10-CM | POA: Diagnosis not present

## 2016-07-20 DIAGNOSIS — R188 Other ascites: Secondary | ICD-10-CM | POA: Diagnosis not present

## 2016-07-20 DIAGNOSIS — E440 Moderate protein-calorie malnutrition: Secondary | ICD-10-CM | POA: Diagnosis not present

## 2016-07-20 DIAGNOSIS — R599 Enlarged lymph nodes, unspecified: Secondary | ICD-10-CM | POA: Diagnosis not present

## 2016-07-21 DIAGNOSIS — Z6834 Body mass index (BMI) 34.0-34.9, adult: Secondary | ICD-10-CM | POA: Diagnosis not present

## 2016-07-21 DIAGNOSIS — R197 Diarrhea, unspecified: Secondary | ICD-10-CM | POA: Diagnosis not present

## 2016-07-21 DIAGNOSIS — R109 Unspecified abdominal pain: Secondary | ICD-10-CM | POA: Diagnosis not present

## 2016-07-21 DIAGNOSIS — R609 Edema, unspecified: Secondary | ICD-10-CM | POA: Diagnosis not present

## 2016-07-21 DIAGNOSIS — R51 Headache: Secondary | ICD-10-CM | POA: Diagnosis not present

## 2016-07-21 DIAGNOSIS — R112 Nausea with vomiting, unspecified: Secondary | ICD-10-CM | POA: Diagnosis not present

## 2016-07-22 DIAGNOSIS — R51 Headache: Secondary | ICD-10-CM | POA: Diagnosis not present

## 2016-07-22 DIAGNOSIS — R109 Unspecified abdominal pain: Secondary | ICD-10-CM | POA: Diagnosis not present

## 2016-07-22 DIAGNOSIS — R112 Nausea with vomiting, unspecified: Secondary | ICD-10-CM | POA: Diagnosis not present

## 2016-07-22 DIAGNOSIS — R197 Diarrhea, unspecified: Secondary | ICD-10-CM | POA: Diagnosis not present

## 2016-07-23 DIAGNOSIS — R197 Diarrhea, unspecified: Secondary | ICD-10-CM | POA: Diagnosis not present

## 2016-07-23 DIAGNOSIS — R109 Unspecified abdominal pain: Secondary | ICD-10-CM | POA: Diagnosis not present

## 2016-07-23 DIAGNOSIS — R51 Headache: Secondary | ICD-10-CM | POA: Diagnosis not present

## 2016-07-23 DIAGNOSIS — R112 Nausea with vomiting, unspecified: Secondary | ICD-10-CM | POA: Diagnosis not present

## 2016-07-24 DIAGNOSIS — E440 Moderate protein-calorie malnutrition: Secondary | ICD-10-CM | POA: Insufficient documentation

## 2016-07-24 DIAGNOSIS — R197 Diarrhea, unspecified: Secondary | ICD-10-CM | POA: Diagnosis not present

## 2016-07-24 DIAGNOSIS — Z6834 Body mass index (BMI) 34.0-34.9, adult: Secondary | ICD-10-CM | POA: Diagnosis not present

## 2016-07-25 DIAGNOSIS — I878 Other specified disorders of veins: Secondary | ICD-10-CM | POA: Diagnosis not present

## 2016-07-25 DIAGNOSIS — R160 Hepatomegaly, not elsewhere classified: Secondary | ICD-10-CM | POA: Diagnosis not present

## 2016-07-25 DIAGNOSIS — Z6834 Body mass index (BMI) 34.0-34.9, adult: Secondary | ICD-10-CM | POA: Diagnosis not present

## 2016-07-25 DIAGNOSIS — R197 Diarrhea, unspecified: Secondary | ICD-10-CM | POA: Diagnosis not present

## 2016-07-25 DIAGNOSIS — R918 Other nonspecific abnormal finding of lung field: Secondary | ICD-10-CM | POA: Diagnosis not present

## 2016-07-25 DIAGNOSIS — R933 Abnormal findings on diagnostic imaging of other parts of digestive tract: Secondary | ICD-10-CM | POA: Diagnosis not present

## 2016-07-26 DIAGNOSIS — R1033 Periumbilical pain: Secondary | ICD-10-CM | POA: Diagnosis not present

## 2016-07-26 DIAGNOSIS — R112 Nausea with vomiting, unspecified: Secondary | ICD-10-CM | POA: Diagnosis not present

## 2016-07-26 DIAGNOSIS — R197 Diarrhea, unspecified: Secondary | ICD-10-CM | POA: Diagnosis not present

## 2016-07-26 DIAGNOSIS — Z6834 Body mass index (BMI) 34.0-34.9, adult: Secondary | ICD-10-CM | POA: Diagnosis not present

## 2016-07-26 DIAGNOSIS — R109 Unspecified abdominal pain: Secondary | ICD-10-CM | POA: Diagnosis not present

## 2016-07-26 DIAGNOSIS — I878 Other specified disorders of veins: Secondary | ICD-10-CM | POA: Diagnosis not present

## 2016-07-27 DIAGNOSIS — R160 Hepatomegaly, not elsewhere classified: Secondary | ICD-10-CM | POA: Diagnosis not present

## 2016-07-27 DIAGNOSIS — Z6834 Body mass index (BMI) 34.0-34.9, adult: Secondary | ICD-10-CM | POA: Diagnosis not present

## 2016-07-27 DIAGNOSIS — R197 Diarrhea, unspecified: Secondary | ICD-10-CM | POA: Diagnosis not present

## 2016-07-27 DIAGNOSIS — I878 Other specified disorders of veins: Secondary | ICD-10-CM | POA: Diagnosis not present

## 2016-08-08 DIAGNOSIS — K5901 Slow transit constipation: Secondary | ICD-10-CM | POA: Diagnosis not present

## 2016-08-10 DIAGNOSIS — F419 Anxiety disorder, unspecified: Secondary | ICD-10-CM | POA: Diagnosis not present

## 2016-08-10 DIAGNOSIS — G479 Sleep disorder, unspecified: Secondary | ICD-10-CM | POA: Diagnosis not present

## 2016-08-10 DIAGNOSIS — Z79890 Hormone replacement therapy (postmenopausal): Secondary | ICD-10-CM | POA: Diagnosis not present

## 2016-08-10 DIAGNOSIS — F329 Major depressive disorder, single episode, unspecified: Secondary | ICD-10-CM | POA: Diagnosis not present

## 2016-08-17 DIAGNOSIS — Z6832 Body mass index (BMI) 32.0-32.9, adult: Secondary | ICD-10-CM | POA: Diagnosis not present

## 2016-08-17 DIAGNOSIS — Z9049 Acquired absence of other specified parts of digestive tract: Secondary | ICD-10-CM | POA: Diagnosis not present

## 2016-08-17 DIAGNOSIS — R5383 Other fatigue: Secondary | ICD-10-CM | POA: Diagnosis not present

## 2016-08-17 DIAGNOSIS — R197 Diarrhea, unspecified: Secondary | ICD-10-CM | POA: Diagnosis not present

## 2016-08-17 DIAGNOSIS — K591 Functional diarrhea: Secondary | ICD-10-CM | POA: Diagnosis not present

## 2016-08-17 DIAGNOSIS — Z980 Intestinal bypass and anastomosis status: Secondary | ICD-10-CM | POA: Diagnosis not present

## 2016-08-29 DIAGNOSIS — M3501 Sicca syndrome with keratoconjunctivitis: Secondary | ICD-10-CM | POA: Diagnosis not present

## 2016-08-29 DIAGNOSIS — H31091 Other chorioretinal scars, right eye: Secondary | ICD-10-CM | POA: Diagnosis not present

## 2016-08-29 DIAGNOSIS — H04123 Dry eye syndrome of bilateral lacrimal glands: Secondary | ICD-10-CM | POA: Diagnosis not present

## 2016-08-30 DIAGNOSIS — Z79890 Hormone replacement therapy (postmenopausal): Secondary | ICD-10-CM | POA: Diagnosis not present

## 2016-08-30 DIAGNOSIS — F329 Major depressive disorder, single episode, unspecified: Secondary | ICD-10-CM | POA: Diagnosis not present

## 2016-08-30 DIAGNOSIS — Z90710 Acquired absence of both cervix and uterus: Secondary | ICD-10-CM | POA: Diagnosis not present

## 2016-09-01 DIAGNOSIS — G4733 Obstructive sleep apnea (adult) (pediatric): Secondary | ICD-10-CM | POA: Diagnosis not present

## 2016-09-01 DIAGNOSIS — Z6833 Body mass index (BMI) 33.0-33.9, adult: Secondary | ICD-10-CM | POA: Diagnosis not present

## 2016-09-01 DIAGNOSIS — G43519 Persistent migraine aura without cerebral infarction, intractable, without status migrainosus: Secondary | ICD-10-CM | POA: Diagnosis not present

## 2016-09-05 DIAGNOSIS — R197 Diarrhea, unspecified: Secondary | ICD-10-CM | POA: Diagnosis not present

## 2016-09-05 DIAGNOSIS — K591 Functional diarrhea: Secondary | ICD-10-CM | POA: Diagnosis not present

## 2016-09-14 DIAGNOSIS — R0683 Snoring: Secondary | ICD-10-CM | POA: Diagnosis not present

## 2016-09-14 DIAGNOSIS — R400 Somnolence: Secondary | ICD-10-CM | POA: Diagnosis not present

## 2016-09-14 DIAGNOSIS — G4733 Obstructive sleep apnea (adult) (pediatric): Secondary | ICD-10-CM | POA: Diagnosis not present

## 2016-10-06 DIAGNOSIS — Z880 Allergy status to penicillin: Secondary | ICD-10-CM | POA: Diagnosis not present

## 2016-10-06 DIAGNOSIS — Z6832 Body mass index (BMI) 32.0-32.9, adult: Secondary | ICD-10-CM | POA: Diagnosis not present

## 2016-10-06 DIAGNOSIS — R413 Other amnesia: Secondary | ICD-10-CM | POA: Diagnosis not present

## 2016-10-06 DIAGNOSIS — D445 Neoplasm of uncertain behavior of pineal gland: Secondary | ICD-10-CM | POA: Diagnosis not present

## 2016-10-06 DIAGNOSIS — R51 Headache: Secondary | ICD-10-CM | POA: Diagnosis not present

## 2016-10-10 DIAGNOSIS — F419 Anxiety disorder, unspecified: Secondary | ICD-10-CM | POA: Diagnosis not present

## 2016-10-10 DIAGNOSIS — F329 Major depressive disorder, single episode, unspecified: Secondary | ICD-10-CM | POA: Diagnosis not present

## 2016-10-11 DIAGNOSIS — R109 Unspecified abdominal pain: Secondary | ICD-10-CM | POA: Diagnosis not present

## 2016-10-11 DIAGNOSIS — R197 Diarrhea, unspecified: Secondary | ICD-10-CM | POA: Diagnosis not present

## 2016-10-11 DIAGNOSIS — F329 Major depressive disorder, single episode, unspecified: Secondary | ICD-10-CM | POA: Diagnosis not present

## 2016-10-11 DIAGNOSIS — Z9049 Acquired absence of other specified parts of digestive tract: Secondary | ICD-10-CM | POA: Diagnosis not present

## 2016-10-11 DIAGNOSIS — Z90710 Acquired absence of both cervix and uterus: Secondary | ICD-10-CM | POA: Diagnosis not present

## 2016-10-11 DIAGNOSIS — R51 Headache: Secondary | ICD-10-CM | POA: Diagnosis not present

## 2016-10-11 DIAGNOSIS — Z79899 Other long term (current) drug therapy: Secondary | ICD-10-CM | POA: Diagnosis not present

## 2016-10-11 DIAGNOSIS — K5900 Constipation, unspecified: Secondary | ICD-10-CM | POA: Diagnosis not present

## 2016-10-25 ENCOUNTER — Encounter: Payer: Self-pay | Admitting: Physical Therapy

## 2016-10-25 ENCOUNTER — Ambulatory Visit: Payer: BLUE CROSS/BLUE SHIELD | Attending: Family Medicine | Admitting: Physical Therapy

## 2016-10-25 ENCOUNTER — Ambulatory Visit: Payer: BLUE CROSS/BLUE SHIELD | Admitting: Occupational Therapy

## 2016-10-25 DIAGNOSIS — R2689 Other abnormalities of gait and mobility: Secondary | ICD-10-CM | POA: Diagnosis not present

## 2016-10-25 DIAGNOSIS — R293 Abnormal posture: Secondary | ICD-10-CM | POA: Diagnosis not present

## 2016-10-25 DIAGNOSIS — R2681 Unsteadiness on feet: Secondary | ICD-10-CM

## 2016-10-25 DIAGNOSIS — M79601 Pain in right arm: Secondary | ICD-10-CM

## 2016-10-25 DIAGNOSIS — R296 Repeated falls: Secondary | ICD-10-CM | POA: Diagnosis not present

## 2016-10-25 DIAGNOSIS — R278 Other lack of coordination: Secondary | ICD-10-CM | POA: Insufficient documentation

## 2016-10-25 DIAGNOSIS — R41844 Frontal lobe and executive function deficit: Secondary | ICD-10-CM | POA: Insufficient documentation

## 2016-10-25 DIAGNOSIS — M79605 Pain in left leg: Secondary | ICD-10-CM | POA: Diagnosis not present

## 2016-10-25 DIAGNOSIS — M6281 Muscle weakness (generalized): Secondary | ICD-10-CM | POA: Diagnosis not present

## 2016-10-25 DIAGNOSIS — R42 Dizziness and giddiness: Secondary | ICD-10-CM | POA: Diagnosis not present

## 2016-10-25 DIAGNOSIS — M79602 Pain in left arm: Secondary | ICD-10-CM | POA: Insufficient documentation

## 2016-10-25 DIAGNOSIS — M79604 Pain in right leg: Secondary | ICD-10-CM | POA: Diagnosis not present

## 2016-10-25 NOTE — Therapy (Signed)
[facility] [address] [place], [place], [address] Phone: [phone]   Fax:  [phone]  Physical Therapy Evaluation  Patient Details  Name: Denise Avila MRN: [id number] Date of Birth: 27-Nov-1975 Referring Provider: Gaynelle Arabian, MD  Encounter Date: 10/25/2016      PT End of Session - 10/25/16 2304    Visit Number 1   Number of Visits 20   Date for PT Re-Evaluation 12/30/16   Authorization Type [employer]   PT Start Time 1225   PT Stop Time 1310   PT Time Calculation (min) 45 min   Equipment Utilized During Treatment Gait belt   Activity Tolerance Patient limited by fatigue;Patient limited by pain   Behavior During Therapy Flat affect      Past Medical History:  Diagnosis Date  . Abnormal Pap smear of cervix 12/2014  . Depression   . Endometriosis   . Fibroids    age 41  . History of kidney stones   . Migraines   . Pleurisy 02/2015   while hospitalized   . Skin cancer (melanoma) [facility])     Past Surgical History:  Procedure Laterality Date  . ABLATION     uterine  . CYSTOSCOPY W/ URETERAL STENT PLACEMENT Left 02/02/2015   Procedure: CYSTOSCOPY WITH RETROGRADE PYELOGRAM/URETERAL STENT PLACEMENT;  Surgeon: Festus Aloe, MD;  Location: [facility];  Service: Urology;  Laterality: Left;  . CYSTOSCOPY WITH URETEROSCOPY AND STENT PLACEMENT Left 02/13/2015   Procedure: LEFT URETEROSCOPY WITH HOLMIUM LASER AND STENT PLACEMENT;  Surgeon: Festus Aloe, MD;  Location: [facility];  Service: Urology;  Laterality: Left;  . DILATION AND CURETTAGE OF UTERUS    . HOLMIUM LASER APPLICATION N/A [id number]   Procedure: HOLMIUM LASER APPLICATION;  Surgeon: Festus Aloe, MD;  Location: [facility];  Service: Urology;  Laterality: N/A;  . LAPAROSCOPY     x 4  . MELANOMA EXCISION  2000   melanoma removed from face, basal cell removed from nose  . REPAIR VAGINAL CUFF N/A 08/21/2015   Procedure: REPAIR VAGINAL CUFF, EXAM UNDER  ANESTHESIA;  Surgeon: Everitt Amber, MD;  Location: [facility];  Service: Gynecology;  Laterality: N/A;  . ROBOTIC ASSISTED TOTAL HYSTERECTOMY WITH BILATERAL SALPINGO OOPHERECTOMY Bilateral 05/21/2015   Procedure: XI ROBOTIC ASSISTED TOTAL HYSTERECTOMY WITH BILATERAL SALPINGO OOPHORECTOMY;  Surgeon: Everitt Amber, MD;  Location: [facility];  Service: Gynecology;  Laterality: Bilateral;    There were no vitals filed for this visit.       Subjective Assessment - 10/25/16 1213    Subjective This 41yo has significant medical history with multiple hospitalizations over last 3 years. She reports increased decline in mobility, balance & endurance with Total colectomy (large intestine) removed in 05/25/2016. She reports ~50# weight gait over last 6 months with decreased activity tolerance. She has chronic diarrhea. She was diagnosed with inoperable benign brain tumor October 2017. She reports monitoring q3 months with MRI with plans for radiation if size increases otherwise will monitor. She had appt with rheumatologist on 10/21/2016 to assess for possible Lupus but unable to attend due to snow in area. Appointment rescheduled >2 months. Patient presents for OT & PT evaluations.     Pertinent History depression, anxiety, Total colectomy sg 05/25/2016, endometriosis with hysterectomy, thyroid, kidney stone, vaginal cuff repair 08/21/2015, benign brain tumor,    Limitations Lifting;Standing;Walking;House hold activities   Patient Stated Goals walk and go into community, go to store   Currently in Pain? Yes   Pain Score 5  in last week, worst 8/10, best 4/10   Pain Location Leg   Pain Orientation Right;Left   Pain Descriptors / Indicators Burning   Pain Type Chronic pain   Pain Onset More than a month ago   Pain Frequency Constant   Aggravating Factors  touching, brushing leg, standing, standing up   Pain Relieving Factors sitting in hot shower,    Effect of Pain on Daily Activities limits activities esp. standing.     Multiple Pain Sites Yes   Pain Score 6  in last week, worst 7/10, best 2/10   Pain Location Shoulder   Pain Orientation Right;Left   Pain Descriptors / Indicators Burning;Aching   Pain Type Chronic pain   Pain Onset More than a month ago   Pain Frequency Constant   Aggravating Factors  sitting without upper back support   Pain Relieving Factors nothing   Pain Score 4  in last week, worst 10/10, best 4/10   Pain Location Head  head & neck   Pain Orientation Anterior;Posterior;Lower   Pain Descriptors / Indicators Aching;Burning;Throbbing   Pain Type Chronic pain   Pain Onset More than a month ago   Pain Frequency Constant   Aggravating Factors  upset/ anxiety with back of head & neck, front just comes on   Pain Relieving Factors dark   Pain Score 2  in last week, worst 5/10, best 2/10   Pain Location Back   Pain Orientation Lower;Mid;Left   Pain Descriptors / Indicators Shooting;Aching   Pain Type Chronic pain   Pain Onset More than a month ago   Pain Frequency Constant   Aggravating Factors  laying in bed, standing or walking too much   Pain Relieving Factors sitting with support.            [facility] PT Assessment - 10/25/16 1225      Assessment   Medical Diagnosis Balance & decreased endurance   Referring Provider Gaynelle Arabian, MD   Onset Date/Surgical Date 05/25/16  total colectomy   Hand Dominance Right     Precautions   Precautions Fall     Restrictions   Weight Bearing Restrictions No     Balance Screen   Has the patient fallen in the past 6 months Yes   How many times? 1-2 / wk   falls onto furniture, dizziness   Has the patient had a decrease in activity level because of a fear of falling?  Yes   Is the patient reluctant to leave their home because of a fear of falling?  Yes     [place]   Living Arrangements Spouse/significant other;Children  11, 14, 8, 18yo children   Type of [place] to enter   Entrance Stairs-Number of Steps 3   Entrance Stairs-Rails Right;Left;Can reach both   [place] One level   Home Equipment Shower seat     Prior Function   Level of Independence Independent;Independent with community mobility without device   Vocation Unemployed   Leisure watch movies, reading, watch children play basketball,      Observation/Other Assessments   Focus on Therapeutic Outcomes (FOTO)  48.95 Functional Status   Neuro Quality of Life  43.5 Lower Extremity   Fear Avoidance Belief Questionnaire (FABQ)  47 (13)     Coordination   Gross Motor Movements are Fluid and Coordinated No     Posture/Postural Control   Posture/Postural Control Postural limitations  Postural Limitations Rounded Shoulders;Forward head;Flexed trunk     Tone   Assessment Location Right Lower Extremity;Left Lower Extremity     ROM / Strength   AROM / PROM / Strength AROM;Strength     AROM   Overall AROM  Due to pain  limited by pain   Overall AROM Comments BLEs appear tightness in hip flexors, hamstrings & heelcords.     Strength   Overall Strength Deficits;Due to pain   Strength Assessment Site Hip;Knee;Ankle   Right/Left Hip Right;Left   Right Hip Flexion 3-/5   Right Hip Extension 3/5  standing with UE support   Right Hip ABduction 3/5  standing with UE support   Left Hip Flexion 3-/5   Left Hip Extension 3/5  standing with UE support   Left Hip ABduction 3/5  standing with UE support   Right/Left Knee Right;Left   Right Knee Flexion 3/5  standing with UE support   Right Knee Extension 3+/5   Left Knee Flexion 3/5  standing with UE support   Left Knee Extension 3+/5   Right/Left Ankle Right;Left   Right Ankle Dorsiflexion 4/5   Right Ankle Plantar Flexion 3-/5   Left Ankle Dorsiflexion 4/5   Left Ankle Plantar Flexion 3-/5     Ambulation/Gait   Ambulation/Gait Yes   Ambulation/Gait Assistance 5: Supervision   Ambulation Distance (Feet) 200  Feet   Assistive device None   Gait Pattern Step-through pattern;Decreased arm swing - right;Decreased arm swing - left;Decreased stride length;Right flexed knee in stance;Left flexed knee in stance;Lateral hip instability;Trunk flexed;Wide base of support;Decreased stance time - left   Ambulation Surface Indoor;Level   Gait velocity 2.25 ft/sec   Stairs Yes   Stairs Assistance 5: Supervision   Stair Management Technique Two rails;Alternating pattern;Forwards   Number of Stairs 4     Standardized Balance Assessment   Standardized Balance Assessment Berg Balance Test;Timed Up and Go Test     Berg Balance Test   Sit to Stand Able to stand without using hands and stabilize independently   Standing Unsupported Able to stand safely 2 minutes   Sitting with Back Unsupported but Feet Supported on Floor or Stool Able to sit safely and securely 2 minutes   Stand to Sit Sits safely with minimal use of hands   Transfers Able to transfer safely, minor use of hands   Standing Unsupported with Eyes Closed Able to stand 10 seconds with supervision   Standing Ubsupported with Feet Together Able to place feet together independently and stand for 1 minute with supervision   From Standing, Reach Forward with Outstretched Arm Can reach forward >12 cm safely (5")   From Standing Position, Pick up Object from Floor Able to pick up shoe, needs supervision   From Standing Position, Turn to Look Behind Over each Shoulder Turn sideways only but maintains balance   Turn 360 Degrees Able to turn 360 degrees safely but slowly   Standing Unsupported, Alternately Place Feet on Step/Stool Able to complete >2 steps/needs minimal assist   Standing Unsupported, One Foot in Front Able to plae foot ahead of the other independently and hold 30 seconds   Standing on One Leg Able to lift leg independently and hold 5-10 seconds  RLE 8 sec & LLE 1 sec   Total Score 43     Timed Up and Go Test   Normal TUG (seconds) 10.22  no  device, unsteadiness in turn   Cognitive TUG (seconds) 12.7  24.30% increase  from std TUG w/ inc. unsteadiness     Functional Gait  Assessment   Gait assessed  Yes   Gait Level Surface Walks 20 ft, slow speed, abnormal gait pattern, evidence for imbalance or deviates 10-15 in outside of the 12 in walkway width. Requires more than 7 sec to ambulate 20 ft.   Change in Gait Speed Able to change speed, demonstrates mild gait deviations, deviates 6-10 in outside of the 12 in walkway width, or no gait deviations, unable to achieve a major change in velocity, or uses a change in velocity, or uses an assistive device.   Gait with Horizontal Head Turns Performs head turns with moderate changes in gait velocity, slows down, deviates 10-15 in outside 12 in walkway width but recovers, can continue to walk.   Gait with Vertical Head Turns Performs task with moderate change in gait velocity, slows down, deviates 10-15 in outside 12 in walkway width but recovers, can continue to walk.   Gait and Pivot Turn Turns slowly, requires verbal cueing, or requires several small steps to catch balance following turn and stop   Step Over Obstacle Is able to step over one shoe box (4.5 in total height) but must slow down and adjust steps to clear box safely. May require verbal cueing.   Gait with Narrow Base of Support Ambulates 4-7 steps.   Gait with Eyes Closed Cannot walk 20 ft without assistance, severe gait deviations or imbalance, deviates greater than 15 in outside 12 in walkway width or will not attempt task.   Ambulating Backwards Walks 20 ft, slow speed, abnormal gait pattern, evidence for imbalance, deviates 10-15 in outside 12 in walkway width.   Steps Alternating feet, must use rail.   Total Score 11     RLE Tone   RLE Tone Within Functional Limits     LLE Tone   LLE Tone Within Functional Limits            Vestibular Assessment - 10/25/16 1230      Symptom Behavior   Type of Dizziness  Lightheadedness   Frequency of Dizziness every position change   Duration of Dizziness ~2 minutes   Aggravating Factors Looking up to the ceiling;Mornings;Moving eyes;Sitting with head tilted back;Turning body quickly;Turning head quickly;Turning head sideways;Sitting in moving car;Supine to sit;Sit to stand;Forward bending   Relieving Factors Head stationary;Dark room;Closing eyes;Rest;Slow movements;Avoiding busy/distracting areas                         PT Short Term Goals - 10/25/16 2328      PT SHORT TERM GOAL #1   Title Patient demonstrates understanding of initial HEP. (Target Date: 11/25/2016)   Time 5   Period Weeks   Status New     PT SHORT TERM GOAL #2   Title Cognitive Timed Up & Go increases <10% from standard TUG. (Target Date: 11/25/2016)   Time 5   Period Weeks   Status New     PT SHORT TERM GOAL #3   Title Patient able to scan environment in gait without balance losses. (Target Date: 11/25/2016)   Time 5   Period Weeks   Status New     PT SHORT TERM GOAL #4   Title Patient ambulates 500' without device with supervision. (Target Date: 11/25/2016)   Time 5   Period Weeks   Status New           PT Long Term Goals - 10/25/16 2324  PT LONG TERM GOAL #1   Title Patient verbalizes & demonstrates understanding of ongoing HEP & fitness plan. (Target Date: 12/30/2016)   Time 10   Period Weeks   Status New     PT LONG TERM GOAL #2   Title Patient reports pain increases </= 2 increments on 0-10 scale with standing & gait activities. (Target Date: 12/30/2016)   Time 10   Period Weeks   Status New     PT LONG TERM GOAL #3   Title Berg Balance >/= 52/56 to indicate lower fall risk. (Target Date: 12/30/2016)   Time 10   Period Weeks   Status New     PT LONG TERM GOAL #4   Title Functional Gait Assessment >/= 20/30 to indicate lower fall risk. (Target Date: 12/30/2016)   Time 10   Period Weeks   Status New     PT LONG TERM GOAL #5   Title  Patient reports >50% improvement in dizziness with activities. (Target Date: 12/30/2016)   Time 10   Period Weeks   Status New     Additional Long Term Goals   Additional Long Term Goals Yes     PT LONG TERM GOAL #6   Title Patient ambulates >1000' outdoors including ramps, curbs & grass surfaces without device independently. (Target Date: 12/30/2016)   Time 10   Period Weeks   Status New               Plan - 10/25/16 2306    Clinical Impression Statement This 41yo female has declined mobility & activity tolerance since Total Colectomy surgery on 05/25/2016. She self-reports via Neuro Quality of Life for LEs 43.5%. She has weakness and impaired flexibility of bilateral lower extremities. She has pain in lower extremities, back and headaches with cervical limiting activities. She has gait deviations and Functional Gait Assessment 11/30 indicate high fall risk. Timed Up & Go cognitive increased 24.3% from standard TUG indicating fall risk with distraction. Berg Balance 43/56 indicates high fall risk. Patient's condition is evolving & plan of care is moderate complexity. Patient would benefit from skilled care to progress activities & safety.    Rehab Potential Good   PT Frequency 2x / week   PT Duration Other (comment)  10 weeks   PT Treatment/Interventions ADLs/Self Care Home Management;[place]ith Repostioning;Moist Heat;DME Instruction;Gait training;Stair training;Functional mobility training;Therapeutic activities;Therapeutic exercise;Balance training;Neuromuscular re-education;Patient/family education;Manual techniques;Vestibular   PT Next Visit Plan assess vestibular system, initiate HEP for balance   Consulted and Agree with Plan of Care Patient      Patient will benefit from skilled therapeutic intervention in order to improve the following deficits and impairments:  Abnormal gait, Decreased activity tolerance, Decreased balance, Decreased endurance, Decreased mobility, Impaired  flexibility, Decreased strength, Dizziness, Postural dysfunction, Pain  Visit Diagnosis: Dizziness and giddiness  Unsteadiness on feet  Other abnormalities of gait and mobility  Repeated falls  Muscle weakness (generalized)  Abnormal posture  Pain in left leg  Pain in right leg     Problem List Patient Active Problem List   Diagnosis Date Noted  . Vaginal cuff dehiscence 08/21/2015  . Vaginal bleeding   . Surgical menopause on hormone replacement therapy 06/22/2015  . Endometriosis of ovary 05/21/2015  . Abnormal uterine bleeding   . Endometriosis   . Pyelonephritis 02/02/2015  . Nephrolithiasis 02/02/2015  . Hydronephrosis 02/02/2015  . Left ureteral stone     Jarreau Callanan PT, DPT 10/25/2016, 11:32 PM  [facility] [phone]  [place], [place], [address] Phone: [phone]   Fax:  [phone]  Name: Kayshia Ruise MRN: [id number] Date of Birth: 1976/08/28
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
## 2016-10-27 DIAGNOSIS — G4734 Idiopathic sleep related nonobstructive alveolar hypoventilation: Secondary | ICD-10-CM | POA: Diagnosis not present

## 2016-10-31 ENCOUNTER — Ambulatory Visit: Payer: BLUE CROSS/BLUE SHIELD | Admitting: Occupational Therapy

## 2016-10-31 ENCOUNTER — Ambulatory Visit: Payer: BLUE CROSS/BLUE SHIELD | Admitting: Physical Therapy

## 2016-10-31 ENCOUNTER — Encounter: Payer: Self-pay | Admitting: Physical Therapy

## 2016-10-31 DIAGNOSIS — R42 Dizziness and giddiness: Secondary | ICD-10-CM

## 2016-10-31 DIAGNOSIS — R2681 Unsteadiness on feet: Secondary | ICD-10-CM

## 2016-10-31 DIAGNOSIS — M79604 Pain in right leg: Secondary | ICD-10-CM | POA: Diagnosis not present

## 2016-10-31 DIAGNOSIS — R41844 Frontal lobe and executive function deficit: Secondary | ICD-10-CM

## 2016-10-31 DIAGNOSIS — R293 Abnormal posture: Secondary | ICD-10-CM

## 2016-10-31 DIAGNOSIS — M79601 Pain in right arm: Secondary | ICD-10-CM | POA: Diagnosis not present

## 2016-10-31 DIAGNOSIS — R2689 Other abnormalities of gait and mobility: Secondary | ICD-10-CM | POA: Diagnosis not present

## 2016-10-31 DIAGNOSIS — M6281 Muscle weakness (generalized): Secondary | ICD-10-CM

## 2016-10-31 DIAGNOSIS — M79605 Pain in left leg: Secondary | ICD-10-CM | POA: Diagnosis not present

## 2016-10-31 DIAGNOSIS — M79602 Pain in left arm: Secondary | ICD-10-CM | POA: Diagnosis not present

## 2016-10-31 DIAGNOSIS — R296 Repeated falls: Secondary | ICD-10-CM | POA: Diagnosis not present

## 2016-10-31 DIAGNOSIS — R278 Other lack of coordination: Secondary | ICD-10-CM | POA: Diagnosis not present
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
## 2016-10-31 NOTE — Patient Instructions (Signed)
Energy Conservation Techniques  1. Sit for as many activities as possible. 2. Use slow, smooth movements.  Rushing increases discomfort. 3. Determine the necessity of performing the task.  Simplify those tasks that are necessary.  (Get clothes out of the dryer when they are warm instead of ironing, let dishes air dry, etc.) 4. Take frequent rests both during and between activities.  Avoid repetitive tasks. 5. Pre-plan your activities; try a daily and/or weekly schedule.  Spread out the activities that are most fatiguing (break up cleaning tasks over multiple days). 6. Remember to plan a balance of work, rest and recreation. 7. Consider the best time for each activity.  Do the most exertive task when you have the most energy. 8. Don't carry items if you can push them.  Slide, don't lift. Push, don't pull. 9. Utilize two hands when appropriate. 10. Maintain good posture and use proper body mechanics.  Avoid remaining in one position for too long.  When lifting, bend at the knees, not at the waist.  Exhale when bending down, inhale when straightening up.  Carry objects as close to your body and as near to the center of the pelvis.  11. Avoid wasted body movements (position yourself for the task so that you avoid bending, twisting, etc.when possible). 12. Select the best working environment.  Consider lighting, ventilation, clothing, and equipment. 34. Organize your storage areas, making the items you use daily convenient.  Store heaviest items at waist height.  Store frequently used items between shoulders and knee height.  Consider leaving frequently used       items on countertops.  (You can organize in storage baskets based on time used/purpose). 14. Feelings and emotions can be real causes of fatigue.  Try to avoid unnecessary worry, irritation, or frustration.  Avoid stress, it can also be a source of fatigue. 15. Get help from other people for difficult tasks. 16. Explore equipment or  items that may be able to do the job for you with greater ease.  (Electric can openers, blenders, lightweight items for cleaning, etc.)   Memory Compensation Strategies  4. Use "WARM" strategy.  W= write it down  A= associate it  R= repeat it  M= make a mental note  2.   You can keep a Social worker.  Use a 3-ring notebook with sections for the following: calendar, important names and phone numbers,  medications, doctors' names/phone numbers, lists/reminders, and a section to journal what you did  each day.   3.    Use a calendar to write appointments down.  4.    Write yourself a schedule for the day.  This can be placed on the calendar or in a separate section of the Memory Notebook.  Keeping a  regular schedule can help memory.  5.    Use medication organizer with sections for each day or morning/evening pills.  You may need help loading it  6.    Keep a basket, or pegboard by the door.  Place items that you need to take out with you in the basket or on the pegboard.  You may also want to  include a message board for reminders.  7.    Use sticky notes.  Place sticky notes with reminders in a place where the task is performed.  For example: " turn off the  stove" placed by the stove, "lock the door" placed on the door at eye level, " take your medications" on  the bathroom mirror  or by the place where you normally take your medications.  8.    Use alarms/timers.  Use while cooking to remind yourself to check on food or as a reminder to take your medicine, or as a  reminder to make a call, or as a reminder to perform another task, etc.

## 2016-10-31 NOTE — Patient Instructions (Signed)
Gaze Stabilization: Sitting    Keeping eyes on target on wall 3 feet away, and move head side to side for _10-30___ seconds. Repeat while moving head up and down for __10-30__ seconds. Do __2-3__ sessions per day.  Copyright  VHI. All rights reserved.   Gaze Stabilization: Tip Card  1.Target must remain in focus, not blurry, and appear stationary while head is in motion. 2.Perform exercises with small head movements (45 to either side of midline). 3.Increase speed of head motion so long as target is in focus. 4.If you wear eyeglasses, be sure you can see target through lens (therapist will give specific instructions for bifocal / progressive lenses). 5.These exercises may provoke dizziness or nausea. Work through these symptoms. If too dizzy, slow head movement slightly. Rest between each exercise. 6.Exercises demand concentration; avoid distractions.  Special Instructions: Exercises may bring on mild to moderate symptoms of 4/10 that resolve within 30 minutes of completing exercises. If symptoms are lasting longer than 30 minutes, modify your exercises by:  >decreasing the # of times you complete each activity >ensuring your symptoms return to baseline before moving onto the next exercise >dividing up exercises so you do not do them all in one session, but multiple short sessions throughout the day >doing them once a day until symptoms improve   Copyright  VHI. All rights reserved.

## 2016-10-31 NOTE — Therapy (Signed)
[facility] [address] [place], [place], [address] Phone: [phone]   Fax:  [phone]  Physical Therapy Treatment  Patient Details  Name: Denise Avila MRN: [id number] Date of Birth: 07/16/1976 Referring Provider: Gaynelle Arabian, MD  Encounter Date: 10/31/2016      PT End of Session - 10/31/16 1021    Visit Number 2   Number of Visits 20   Date for PT Re-Evaluation 12/30/16   Authorization Type BCBS   PT Start Time 0930   PT Stop Time 1017   PT Time Calculation (min) 47 min   Activity Tolerance Patient limited by fatigue;Patient limited by pain   Behavior During Therapy Flat affect      Past Medical History:  Diagnosis Date  . Abnormal Pap smear of cervix 12/2014  . Depression   . Endometriosis   . Fibroids    age 41  . History of kidney stones   . Migraines   . Pleurisy 02/2015   while hospitalized   . Skin cancer (melanoma) [facility])     Past Surgical History:  Procedure Laterality Date  . ABLATION     uterine  . CYSTOSCOPY W/ URETERAL STENT PLACEMENT Left 02/02/2015   Procedure: CYSTOSCOPY WITH RETROGRADE PYELOGRAM/URETERAL STENT PLACEMENT;  Surgeon: Festus Aloe, MD;  Location: [facility];  Service: Urology;  Laterality: Left;  . CYSTOSCOPY WITH URETEROSCOPY AND STENT PLACEMENT Left 02/13/2015   Procedure: LEFT URETEROSCOPY WITH HOLMIUM LASER AND STENT PLACEMENT;  Surgeon: Festus Aloe, MD;  Location: [facility];  Service: Urology;  Laterality: Left;  . DILATION AND CURETTAGE OF UTERUS    . HOLMIUM LASER APPLICATION N/A [id number]   Procedure: HOLMIUM LASER APPLICATION;  Surgeon: Festus Aloe, MD;  [facility]: [facility];  Service: Urology;  Laterality: N/A;  . LAPAROSCOPY     x 4  . MELANOMA EXCISION  2000   melanoma removed from face, basal cell removed from nose  . REPAIR VAGINAL CUFF N/A 08/21/2015   Procedure: REPAIR VAGINAL CUFF, EXAM UNDER ANESTHESIA;  Surgeon: Everitt Amber, MD;  Location: [facility];  Service: Gynecology;  Laterality: N/A;  . ROBOTIC ASSISTED TOTAL HYSTERECTOMY WITH BILATERAL SALPINGO OOPHERECTOMY Bilateral 05/21/2015   Procedure: XI ROBOTIC ASSISTED TOTAL HYSTERECTOMY WITH BILATERAL SALPINGO OOPHORECTOMY;  Surgeon: Everitt Amber, MD;  [facility]: [facility];  Service: Gynecology;  Laterality: Bilateral;    There were no vitals filed for this visit.      Subjective Assessment - 10/31/16 0933    Subjective Pt reporting dizziness this morning, 3/10 and pain in neck, abdomen and LE.  Also feels nauseous.  Fatigued but no other issues after initial evaluation.   Pertinent History depression, anxiety, Total colectomy sg 05/25/2016, endometriosis with hysterectomy, thyroid, kidney stone, vaginal cuff repair 08/21/2015, benign brain tumor,    Limitations Lifting;Standing;Walking;House hold activities   Patient Stated Goals walk and go into community, go to store   Currently in Pain? Yes   Pain Score 5    Pain Location Neck   Pain Descriptors / Indicators Discomfort   Pain Frequency Constant   Pain Relieving Factors rest          Vestibular Assessment - 10/31/16 0936      Vestibular Assessment   General Observation Flat affect, moves cautiously, neck movement into extension causes radiating pain to shoulder.  Also reports intermittent spinning with lying down-5 minutes.     Symptom Behavior   Type of Dizziness Lightheadedness  also has diplopia, blurring of vision  Frequency of Dizziness constant   Duration of Dizziness constant   Aggravating Factors Activity in general   Relieving Factors Head stationary;Dark room;Closing eyes;Rest;Slow movements;Avoiding busy/distracting areas     Occulomotor Exam   Occulomotor Alignment Abnormal  R eye elevated   Spontaneous Absent   Gaze-induced Left beating nystagmus with L gaze   Smooth Pursuits Saccades   Saccades Slow;Poor trajectory   Comment Convergence impaired, reports spinning 4/10 with all eye movements      Vestibulo-Occular Reflex   VOR 1 Head Only (x 1 viewing) only able to perform x 10 seconds, unable to keep A in focus-reports HA and increase in spinning afterwards   VOR to Slow Head Movement Positive bilaterally;Comment  increases symptoms to 6/10   VOR Cancellation Normal   Comment HIT: + to L, - to R.  Increased symptoms to 6/10     Positional Testing   Sidelying Test Sidelying Right;Sidelying Left     Dix-Hallpike Right   Dix-Hallpike Right Duration 30 sec   Dix-Hallpike Right Symptoms Other (comment)  low amplitude nystagmus, direction difficult to see     Dix-Hallpike Left   Dix-Hallpike Left Duration 30 sec   Dix-Hallpike Left Symptoms No nystagmus     Sidelying Right   Sidelying Right Duration 30 sec   Sidelying Right Symptoms --  nystagmus present, low amplitude, direction difficult to see     Sidelying Left   Sidelying Left Symptoms No nystagmus  reports feeling "weird"     Positional Sensitivities   Nose to Right Knee No dizziness   Right Knee to Sitting No dizziness   Nose to Left Knee No dizziness   Left Knee to Sitting No dizziness   Head Turning x 5 Mild dizziness   Head Nodding x 5 Mild dizziness           PT Education - 10/31/16 1020    Education provided Yes   Education Details vestibular findings, x 1 viewing HEP, plan for re-assessment of positional testing next visit with Frenzel lenses   Person(s) Educated Patient   Methods Explanation;Demonstration;Handout   Comprehension Verbalized understanding;Need further instruction          PT Short Term Goals - 10/25/16 2328      PT SHORT TERM GOAL #1   Title Patient demonstrates understanding of initial HEP. (Target Date: 11/25/2016)   Time 5   Period Weeks   Status New     PT SHORT TERM GOAL #2   Title Cognitive Timed Up & Go increases <10% from standard TUG. (Target Date: 11/25/2016)   Time 5   Period Weeks   Status New     PT SHORT TERM GOAL #3   Title Patient able to scan environment  in gait without balance losses. (Target Date: 11/25/2016)   Time 5   Period Weeks   Status New     PT SHORT TERM GOAL #4   Title Patient ambulates 500' without device with supervision. (Target Date: 11/25/2016)   Time 5   Period Weeks   Status New           PT Long Term Goals - 10/25/16 2324      PT LONG TERM GOAL #1   Title Patient verbalizes & demonstrates understanding of ongoing HEP & fitness plan. (Target Date: 12/30/2016)   Time 10   Period Weeks   Status New     PT LONG TERM GOAL #2   Title Patient reports pain increases </= 2 increments on  0-10 scale with standing & gait activities. (Target Date: 12/30/2016)   Time 10   Period Weeks   Status New     PT LONG TERM GOAL #3   Title Berg Balance >/= 52/56 to indicate lower fall risk. (Target Date: 12/30/2016)   Time 10   Period Weeks   Status New     PT LONG TERM GOAL #4   Title Functional Gait Assessment >/= 20/30 to indicate lower fall risk. (Target Date: 12/30/2016)   Time 10   Period Weeks   Status New     PT LONG TERM GOAL #5   Title Patient reports >50% improvement in dizziness with activities. (Target Date: 12/30/2016)   Time 10   Period Weeks   Status New     Additional Long Term Goals   Additional Long Term Goals Yes     PT LONG TERM GOAL #6   Title Patient ambulates >1000' outdoors including ramps, curbs & grass surfaces without device independently. (Target Date: 12/30/2016)   Time 10   Period Weeks   Status New               Plan - 10/31/16 1021    Clinical Impression Statement Session with focus on more in depth assessment of vestibular system.  Pt signs and symptoms are consistent with central vertigo, motion sensitivity and impaired gaze stabilization.  Peripheral vestibular impairment could not be ruled out today; plan to re-assess positional testing at next visit with use of Frenzel Lenses for more accurate assessment.  Habituation HEP initiate today.  Pt will benefit from ongoing PT  services to address vestibular and balance impairments and will discuss visual impairments with OT.  Pt also reporting some issues with word finding and memory impairments; pt may also benefit from SLP cognitive assessment-will discuss with pt at next visit.     Rehab Potential Good   PT Treatment/Interventions ADLs/Self Care Home Management;Canalith Repostioning;Moist Heat;DME Instruction;Gait training;Stair training;Functional mobility training;Therapeutic activities;Therapeutic exercise;Balance training;Neuromuscular re-education;Patient/family education;Manual techniques;Vestibular   PT Next Visit Plan discuss impaired vision/convergence with OT, review x 1 viewing HEP, re-assess positional testing with Frenzel lenses, discuss with pt SLP evaluation   Consulted and Agree with Plan of Care Patient      Patient will benefit from skilled therapeutic intervention in order to improve the following deficits and impairments:  Abnormal gait, Decreased activity tolerance, Decreased balance, Decreased endurance, Decreased mobility, Impaired flexibility, Decreased strength, Dizziness, Postural dysfunction, Pain  Visit Diagnosis: Dizziness and giddiness  Unsteadiness on feet  Other abnormalities of gait and mobility  Repeated falls  Muscle weakness (generalized)  Abnormal posture  Pain in left leg  Pain in right leg     Problem List Patient Active Problem List   Diagnosis Date Noted  . Vaginal cuff dehiscence 08/21/2015  . Vaginal bleeding   . Surgical menopause on hormone replacement therapy 06/22/2015  . Endometriosis of ovary 05/21/2015  . Abnormal uterine bleeding   . Endometriosis   . Pyelonephritis 02/02/2015  . Nephrolithiasis 02/02/2015  . Hydronephrosis 02/02/2015  . Left ureteral stone     Raylene Everts [facility] 10/31/2016, 10:32 AM  [facility] [address] [place], [place], [address] Phone: [phone]    Fax:  [phone]  Name: Haillie Premo MRN: [id number] Date of Birth: 1976/07/26

## 2016-11-04 ENCOUNTER — Ambulatory Visit: Payer: BLUE CROSS/BLUE SHIELD | Admitting: Occupational Therapy

## 2016-11-04 ENCOUNTER — Ambulatory Visit: Payer: BLUE CROSS/BLUE SHIELD | Attending: Family Medicine | Admitting: Physical Therapy

## 2016-11-04 ENCOUNTER — Encounter: Payer: Self-pay | Admitting: Physical Therapy

## 2016-11-04 DIAGNOSIS — R2681 Unsteadiness on feet: Secondary | ICD-10-CM

## 2016-11-04 DIAGNOSIS — R296 Repeated falls: Secondary | ICD-10-CM | POA: Diagnosis not present

## 2016-11-04 DIAGNOSIS — M6281 Muscle weakness (generalized): Secondary | ICD-10-CM

## 2016-11-04 DIAGNOSIS — M79605 Pain in left leg: Secondary | ICD-10-CM | POA: Diagnosis not present

## 2016-11-04 DIAGNOSIS — M79604 Pain in right leg: Secondary | ICD-10-CM | POA: Diagnosis not present

## 2016-11-04 DIAGNOSIS — R41844 Frontal lobe and executive function deficit: Secondary | ICD-10-CM | POA: Insufficient documentation

## 2016-11-04 DIAGNOSIS — R278 Other lack of coordination: Secondary | ICD-10-CM | POA: Diagnosis not present

## 2016-11-04 DIAGNOSIS — R293 Abnormal posture: Secondary | ICD-10-CM

## 2016-11-04 DIAGNOSIS — R42 Dizziness and giddiness: Secondary | ICD-10-CM

## 2016-11-04 DIAGNOSIS — R2689 Other abnormalities of gait and mobility: Secondary | ICD-10-CM | POA: Insufficient documentation

## 2016-11-04 NOTE — Patient Instructions (Addendum)
Seated hold a ball between your hands , raise arms up to just above shoulder height without elevating shoulder or arching back,then  lower ball back to your lap. Keep elbows straight.   Perform 2 sets of 10 reps 1x day  Diplopia HEP:  Perform at least 1 times per day. Stop if your eye becomes fatigued or hurts and try again later.  1. Hold a small object/card in front of you.  Hold it in the middle at arm's length away.    2. Cover your LEFT eye and look at the object with your RIGHT eye.  3. Slowly move the object side to side in front of you while continuing to watch it with your RIGHT eye.  4.  Remember to keep your head still and only move your eye.  5.  Repeat 5-10 times.  6.  Then, move object up and down while watching it 5-10 times.  7. Cover your RIGHT eye and look at the object with your LEFT eye while you repeat #1-6 above.  8.  Now, uncover both eyes and try to focus on the object while holding it in the middle.  Try to make it 1 image.   9.  If you can, try to hold it for 10-30 sec increasing as able.    10.  Once you can make the image 1 for at least 30 sec in the middle, repeat #1-6 above with both eyes moving slowly and only in the range that you can keep the image 1.                                                         1. Grip Strengthening (Resistive Putty)   Squeeze putty using thumb and all fingers. Repeat _20___ times. Do __2__ sessions per day.   2. Roll putty into tube on table and pinch between each finger and thumb x 10 reps each. (can do ring and small finger together)     Copyright  VHI. All rights reserved.
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
## 2016-11-04 NOTE — Patient Instructions (Signed)
Habituation - Tip Card  1.The goal of habituation training is to assist in decreasing symptoms of vertigo, dizziness, or nausea provoked by specific head and body motions. 2.These exercises may initially increase symptoms; however, be persistent and work through symptoms. With repetition and time, the exercises will assist in reducing or eliminating symptoms. 3.Exercises should be stopped and discussed with the therapist if you experience any of the following: - Sudden change or fluctuation in hearing - New onset of ringing in the ears, or increase in current intensity - Any fluid discharge from the ear - Severe pain in neck or back - Extreme nausea  Habituation - Sit to Side-Lying   Sit on edge of bed. Turn head to look over left shoulder.  Lie down onto the right side and hold until dizziness stops, plus 20 seconds.  Return to sitting and wait until dizziness stops, plus 20 seconds.  Turn head to look over right shoulder.  Lie down to to the left side.  This is one repetition. Repeat sequence 5 repetitions per session. Do 1-2 sessions per day.  Copyright  VHI. All rights reserved.

## 2016-11-04 NOTE — Therapy (Signed)
[facility] [address] [place], [place], [address] Phone: [phone]   Fax:  [phone]  Physical Therapy Treatment  Patient Details  Name: Denise Avila MRN: [id number] Date of Birth: 08/16/1976 Referring Provider: Gaynelle Arabian, MD  Encounter Date: 11/04/2016      PT End of Session - 11/04/16 1013    Visit Number 3   Number of Visits 20   Date for PT Re-Evaluation 12/30/16   Authorization Type BCBS-30 visit limit for PT   PT Start Time 0931   PT Stop Time 1012   PT Time Calculation (min) 41 min   Activity Tolerance Other (comment)  limited by dizziness    Behavior During Therapy Flat affect      Past Medical History:  Diagnosis Date  . Abnormal Pap smear of cervix 12/2014  . Depression   . Endometriosis   . Fibroids    age 34  . History of kidney stones   . Migraines   . Pleurisy 02/2015   while hospitalized   . Skin cancer (melanoma) [facility])     Past Surgical History:  Procedure Laterality Date  . ABLATION     uterine  . CYSTOSCOPY W/ URETERAL STENT PLACEMENT Left 02/02/2015   Procedure: CYSTOSCOPY WITH RETROGRADE PYELOGRAM/URETERAL STENT PLACEMENT;  Surgeon: Festus Aloe, MD;  [facility];  Service: Urology;  Laterality: Left;  . CYSTOSCOPY WITH URETEROSCOPY AND STENT PLACEMENT Left 02/13/2015   Procedure: LEFT URETEROSCOPY WITH HOLMIUM LASER AND STENT PLACEMENT;  Surgeon: Festus Aloe, MD;  [facility];  Service: Urology;  Laterality: Left;  . DILATION AND CURETTAGE OF UTERUS    . HOLMIUM LASER APPLICATION N/A [id number]   Procedure: HOLMIUM LASER APPLICATION;  Surgeon: Festus Aloe, MD;  [facility];  Service: Urology;  Laterality: N/A;  . LAPAROSCOPY     x 4  . MELANOMA EXCISION  2000   melanoma removed from face, basal cell removed from nose  . REPAIR VAGINAL CUFF N/A 08/21/2015   Procedure: REPAIR VAGINAL CUFF, EXAM UNDER ANESTHESIA;  Surgeon: Everitt Amber, MD;   [facility]: [facility];  Service: Gynecology;  Laterality: N/A;  . ROBOTIC ASSISTED TOTAL HYSTERECTOMY WITH BILATERAL SALPINGO OOPHERECTOMY Bilateral 05/21/2015   Procedure: XI ROBOTIC ASSISTED TOTAL HYSTERECTOMY WITH BILATERAL SALPINGO OOPHORECTOMY;  Surgeon: Everitt Amber, MD;  [facility];  Service: Gynecology;  Laterality: Bilateral;    There were no vitals filed for this visit.      Subjective Assessment - 11/04/16 0926    Subjective Pt just finishing with OT; reporting 3/10 dizziness after session and practicing visual exercises.  No reports of nausea.  Does continue to report neck, back and abdominal pain.   Pertinent History depression, anxiety, Total colectomy sg 05/25/2016, endometriosis with hysterectomy, thyroid, kidney stone, vaginal cuff repair 08/21/2015, benign brain tumor,    Limitations Lifting;Standing;Walking;House hold activities   Patient Stated Goals walk and go into community, go to store   Currently in Pain? Yes   Pain Score 5    Pain Location Abdomen   Pain Descriptors / Indicators Discomfort   Pain Type Chronic pain   Pain Score 5   Pain Location Neck   Pain Orientation Right;Left   Pain Descriptors / Indicators Discomfort   Pain Type Chronic pain          Vestibular Assessment - 11/04/16 0937      Positional Testing   Dix-Hallpike Dix-Hallpike Right;Dix-Hallpike Left   Sidelying Test Sidelying Right;Sidelying Left   Horizontal  Canal Testing Horizontal Canal Right;Horizontal Canal Left     Dix-Hallpike Right   Dix-Hallpike Right Duration >30 seconds   Dix-Hallpike Right Symptoms No nystagmus;Other (comment)  but pupils with constant dilation<>constriction     Dix-Hallpike Left   Dix-Hallpike Left Duration >30 seconds   Dix-Hallpike Left Symptoms No nystagmus;Other (comment)  mild dizziness     Sidelying Right   Sidelying Right Duration >30 seconds   Sidelying Right Symptoms Other (comment)  Right rotatory nystagmus, low amplitude     Sidelying  Left   Sidelying Left Duration 0   Sidelying Left Symptoms No nystagmus     Horizontal Canal Right   Horizontal Canal Right Duration >30 seconds   Horizontal Canal Right Symptoms Other (comment)  reports mild dizziness, no nystagmus noted     Horizontal Canal Left   Horizontal Canal Left Duration 0   Horizontal Canal Left Symptoms Normal           Vestibular Treatment/Exercise - 11/04/16 1010      Vestibular Treatment/Exercise   Gaze Exercises X1 Viewing Horizontal     X1 Viewing Horizontal   Foot Position together, apart, standing   Reps 3   Comments 10 seconds each           PT Education - 11/04/16 1009    Education provided Yes   Education Details Education on findings of positional testing; HEP including Bradt-Daroff and progressing x 1 viewing to unsupported sitting.   Person(s) Educated Patient   Methods Explanation;Demonstration;Handout   Comprehension Verbalized understanding          PT Short Term Goals - 10/25/16 2328      PT SHORT TERM GOAL #1   Title Patient demonstrates understanding of initial HEP. (Target Date: 11/25/2016)   Time 5   Period Weeks   Status New     PT SHORT TERM GOAL #2   Title Cognitive Timed Up & Go increases <10% from standard TUG. (Target Date: 11/25/2016)   Time 5   Period Weeks   Status New     PT SHORT TERM GOAL #3   Title Patient able to scan environment in gait without balance losses. (Target Date: 11/25/2016)   Time 5   Period Weeks   Status New     PT SHORT TERM GOAL #4   Title Patient ambulates 500' without device with supervision. (Target Date: 11/25/2016)   Time 5   Period Weeks   Status New           PT Long Term Goals - 10/25/16 2324      PT LONG TERM GOAL #1   Title Patient verbalizes & demonstrates understanding of ongoing HEP & fitness plan. (Target Date: 12/30/2016)   Time 10   Period Weeks   Status New     PT LONG TERM GOAL #2   Title Patient reports pain increases </= 2 increments on  0-10 scale with standing & gait activities. (Target Date: 12/30/2016)   Time 10   Period Weeks   Status New     PT LONG TERM GOAL #3   Title Berg Balance >/= 52/56 to indicate lower fall risk. (Target Date: 12/30/2016)   Time 10   Period Weeks   Status New     PT LONG TERM GOAL #4   Title Functional Gait Assessment >/= 20/30 to indicate lower fall risk. (Target Date: 12/30/2016)   Time 10   Period Weeks   Status New     PT LONG  TERM GOAL #5   Title Patient reports >50% improvement in dizziness with activities. (Target Date: 12/30/2016)   Time 10   Period Weeks   Status New     Additional Long Term Goals   Additional Long Term Goals Yes     PT LONG TERM GOAL #6   Title Patient ambulates >1000' outdoors including ramps, curbs & grass surfaces without device independently. (Target Date: 12/30/2016)   Time 10   Period Weeks   Status New               Plan - 11/04/16 1013    Clinical Impression Statement Focus on assessment of peripheral vestibular system with positional testing with Frenzel lenses.  Pt's symptoms appear to be consistent with cupulolithiasis but pt would likely not tolerate treatment with repositioning maneuver due to presence of headache, neck pain.  Pt given and instructed in Brandt-daroff for HEP; demonstrated to pt only-did not return demonstrate due to severity of symptoms.  Pt reports x 1 viewing at home x 15 seconds causes symptoms of 6-7/10; advised to return to 10 seconds.  Assessed x 1 viewing in standing x 10 second with symptoms increasing to 8/10.  Advised pt to keep HEP in sitting but progress to unsupported sitting.  Will continue to address.   PT Treatment/Interventions ADLs/Self Care Home Management;Canalith Repostioning;Moist Heat;DME Instruction;Gait training;Stair training;Functional mobility training;Therapeutic activities;Therapeutic exercise;Balance training;Neuromuscular re-education;Patient/family education;Manual techniques;Vestibular   PT  Next Visit Plan Review x 1 viewing, have pt demonstrate Brandt-Daroff exercise to assess understanding.  Add corner balance exercises   Consulted and Agree with Plan of Care Patient      Patient will benefit from skilled therapeutic intervention in order to improve the following deficits and impairments:  Abnormal gait, Decreased activity tolerance, Decreased balance, Decreased endurance, Decreased mobility, Impaired flexibility, Decreased strength, Dizziness, Postural dysfunction, Pain  Visit Diagnosis: Dizziness and giddiness  Unsteadiness on feet  Other abnormalities of gait and mobility  Repeated falls  Muscle weakness (generalized)  Abnormal posture  Pain in left leg  Pain in right leg     Problem List Patient Active Problem List   Diagnosis Date Noted  . Vaginal cuff dehiscence 08/21/2015  . Vaginal bleeding   . Surgical menopause on hormone replacement therapy 06/22/2015  . Endometriosis of ovary 05/21/2015  . Abnormal uterine bleeding   . Endometriosis   . Pyelonephritis 02/02/2015  . Nephrolithiasis 02/02/2015  . Hydronephrosis 02/02/2015  . Left ureteral stone    Raylene Everts, PT, DPT 11/04/16    11:20 AM   [facility] [address] [place], [place], [address] Phone: [phone]   Fax:  [phone]  Name: Denise Avila MRN: [id number] Date of Birth: 01-12-1976

## 2016-11-08 ENCOUNTER — Ambulatory Visit: Payer: BLUE CROSS/BLUE SHIELD | Admitting: Physical Therapy

## 2016-11-08 ENCOUNTER — Ambulatory Visit: Payer: BLUE CROSS/BLUE SHIELD | Admitting: Occupational Therapy

## 2016-11-11 ENCOUNTER — Ambulatory Visit: Payer: BLUE CROSS/BLUE SHIELD | Admitting: Physical Therapy

## 2016-11-11 ENCOUNTER — Encounter: Payer: BLUE CROSS/BLUE SHIELD | Admitting: Occupational Therapy

## 2016-11-11 DIAGNOSIS — R112 Nausea with vomiting, unspecified: Secondary | ICD-10-CM | POA: Diagnosis not present

## 2016-11-11 DIAGNOSIS — E348 Other specified endocrine disorders: Secondary | ICD-10-CM | POA: Diagnosis not present

## 2016-11-11 DIAGNOSIS — E860 Dehydration: Secondary | ICD-10-CM | POA: Diagnosis not present

## 2016-11-11 DIAGNOSIS — R51 Headache: Secondary | ICD-10-CM | POA: Diagnosis not present

## 2016-11-11 DIAGNOSIS — Z881 Allergy status to other antibiotic agents status: Secondary | ICD-10-CM | POA: Diagnosis not present

## 2016-11-11 DIAGNOSIS — Z880 Allergy status to penicillin: Secondary | ICD-10-CM | POA: Diagnosis not present

## 2016-11-11 DIAGNOSIS — Z79899 Other long term (current) drug therapy: Secondary | ICD-10-CM | POA: Diagnosis not present

## 2016-11-11 DIAGNOSIS — R55 Syncope and collapse: Secondary | ICD-10-CM | POA: Diagnosis not present

## 2016-11-11 DIAGNOSIS — G930 Cerebral cysts: Secondary | ICD-10-CM | POA: Diagnosis not present

## 2016-11-11 DIAGNOSIS — R860 Abnormal level of enzymes in specimens from male genital organs: Secondary | ICD-10-CM | POA: Diagnosis not present

## 2016-11-11 DIAGNOSIS — F329 Major depressive disorder, single episode, unspecified: Secondary | ICD-10-CM | POA: Diagnosis not present

## 2016-11-11 DIAGNOSIS — Z883 Allergy status to other anti-infective agents status: Secondary | ICD-10-CM | POA: Diagnosis not present

## 2016-11-11 DIAGNOSIS — Z6833 Body mass index (BMI) 33.0-33.9, adult: Secondary | ICD-10-CM | POA: Diagnosis not present

## 2016-11-13 IMAGING — CR DG ABDOMEN 2V
2 series · 2 of 2 positions shown · non-contrast
Comparison: 11/27/2015; 11/25/2015

CLINICAL DATA: Abdominal distention. Right lower quadrant abdominal
pain. Constipation.

EXAM:
ABDOMEN - 2 VIEW

[w abdomen upright *]
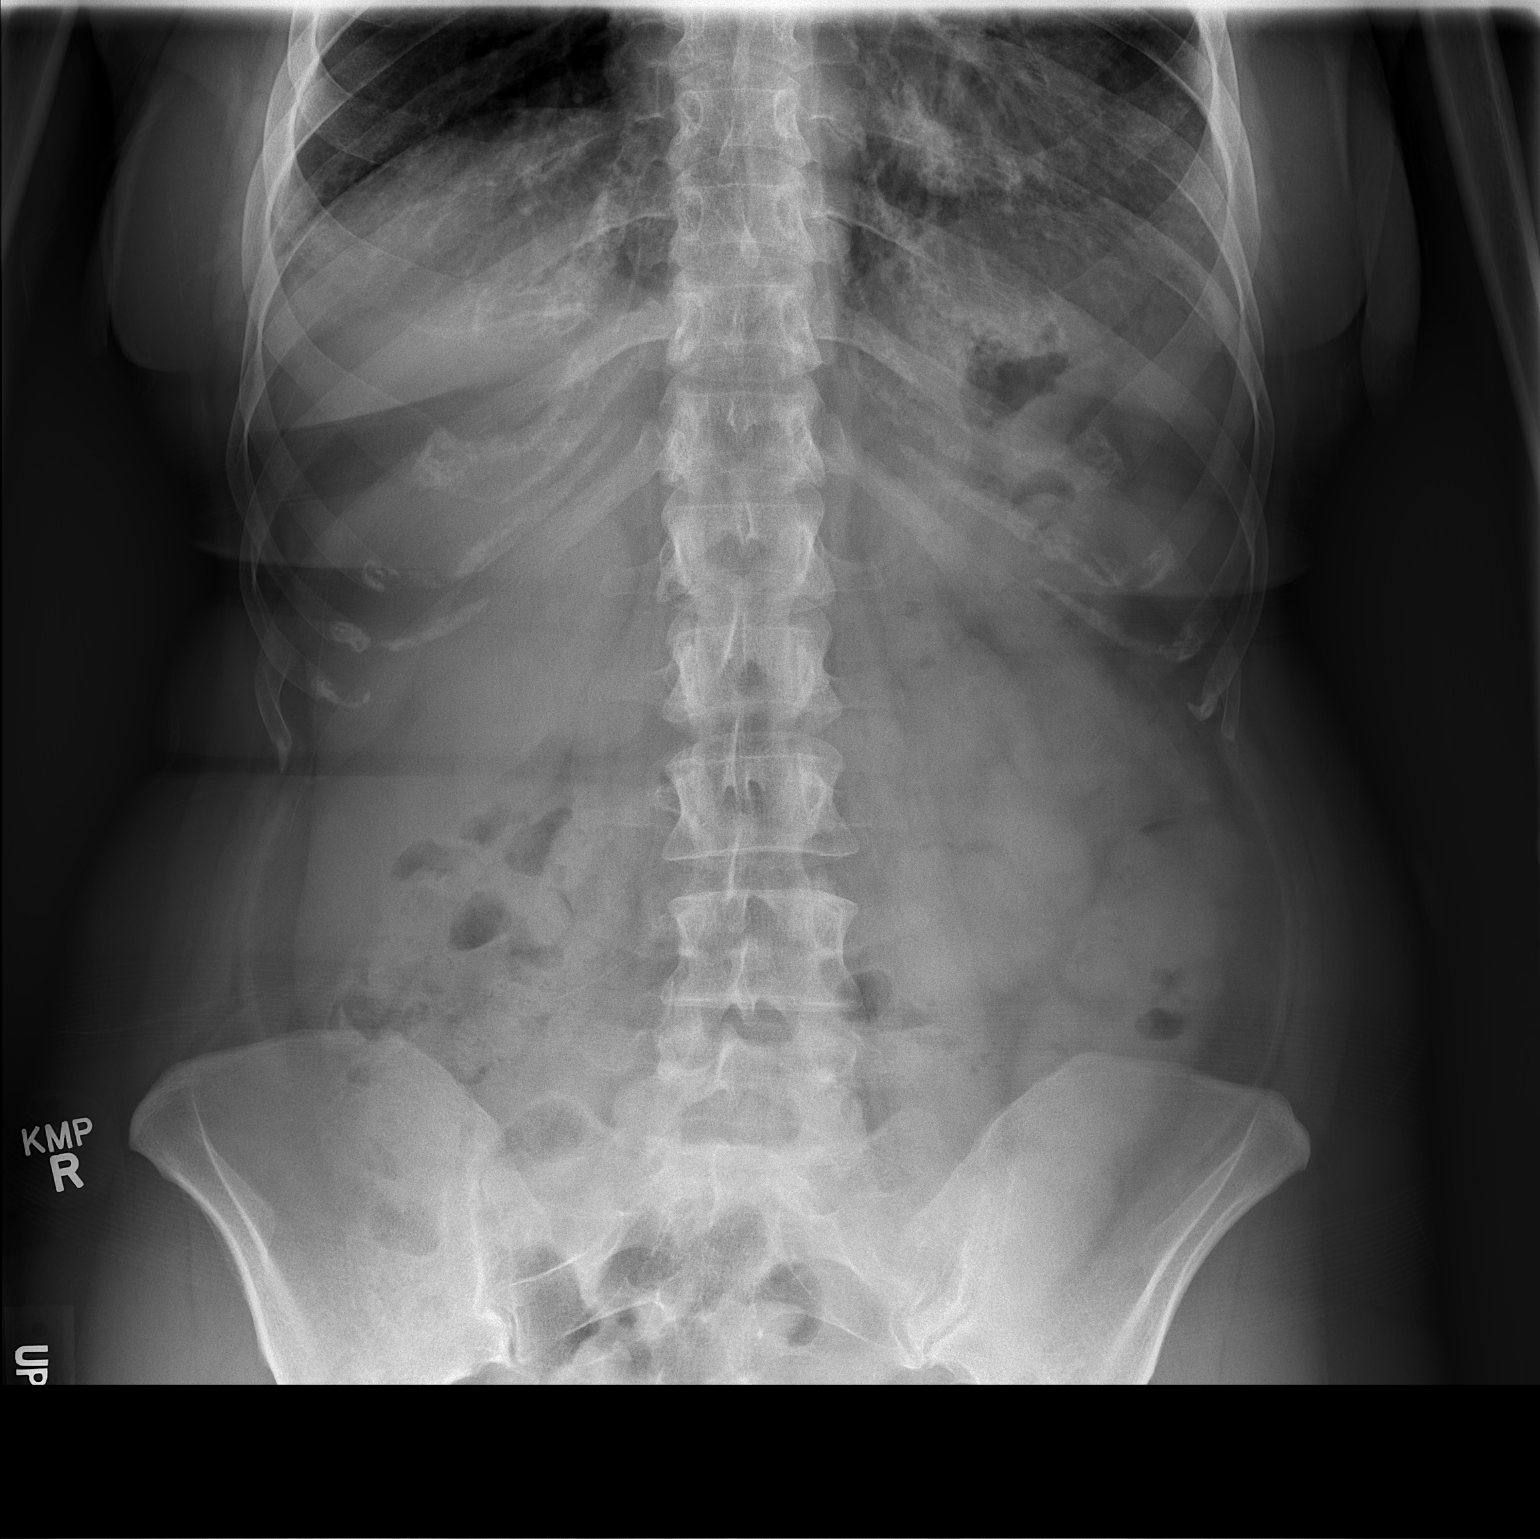

[t abdomen supine]
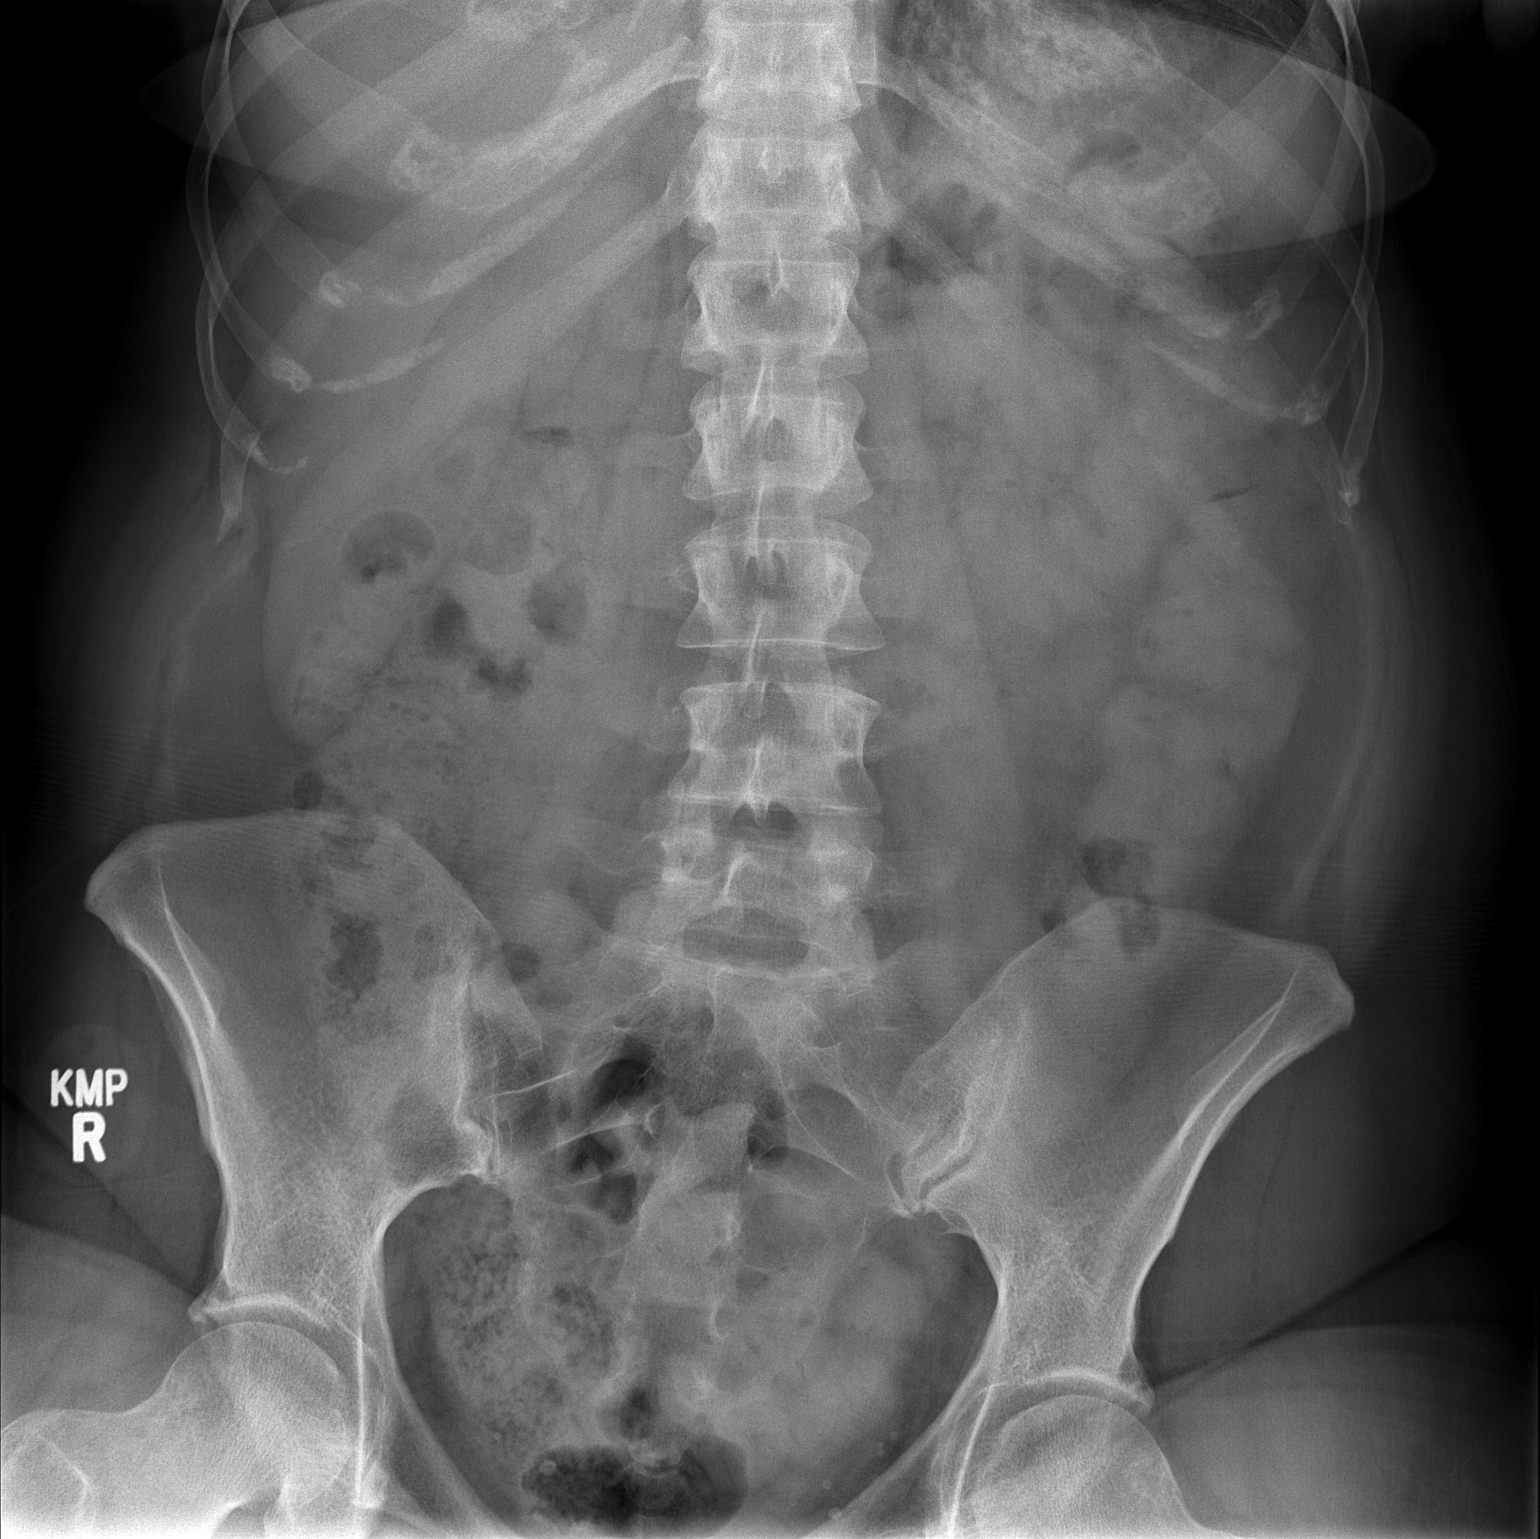

[2 of 2 positions shown; findings below may reference images not displayed]

FINDINGS: Interval passage all all previously noted Sitz markers.

Moderate colonic stool burden without evidence of enteric
obstruction. No pneumoperitoneum, pneumatosis or portal venous gas.

Several phleboliths overlie the lower pelvis bilaterally. No
definitive abnormal opacities overlie the expected location of
either renal fossa, ureter or the urinary bladder given overlying
colonic stool burden.

No acute osseus abnormalities.
IMPRESSION: 1. Moderate colonic stool burden without evidence of enteric
obstruction.
2. Interval passage of all previously noted Sitz markers.

## 2016-11-15 ENCOUNTER — Ambulatory Visit: Payer: BLUE CROSS/BLUE SHIELD | Admitting: Occupational Therapy

## 2016-11-15 ENCOUNTER — Ambulatory Visit: Payer: BLUE CROSS/BLUE SHIELD | Admitting: Physical Therapy

## 2016-11-17 ENCOUNTER — Ambulatory Visit: Payer: BLUE CROSS/BLUE SHIELD | Admitting: Occupational Therapy

## 2016-11-17 ENCOUNTER — Ambulatory Visit: Payer: BLUE CROSS/BLUE SHIELD

## 2016-11-21 ENCOUNTER — Ambulatory Visit: Payer: BLUE CROSS/BLUE SHIELD | Admitting: Physical Therapy

## 2016-11-21 ENCOUNTER — Ambulatory Visit: Payer: BLUE CROSS/BLUE SHIELD | Admitting: Occupational Therapy

## 2016-11-22 ENCOUNTER — Ambulatory Visit: Payer: BLUE CROSS/BLUE SHIELD | Admitting: Physical Therapy

## 2016-11-22 ENCOUNTER — Ambulatory Visit: Payer: BLUE CROSS/BLUE SHIELD | Admitting: Occupational Therapy

## 2016-11-29 ENCOUNTER — Ambulatory Visit: Payer: BLUE CROSS/BLUE SHIELD | Admitting: Physical Therapy

## 2016-11-29 ENCOUNTER — Ambulatory Visit: Payer: BLUE CROSS/BLUE SHIELD | Admitting: Occupational Therapy

## 2016-12-02 ENCOUNTER — Ambulatory Visit: Payer: BLUE CROSS/BLUE SHIELD | Attending: Family Medicine | Admitting: Physical Therapy

## 2016-12-02 ENCOUNTER — Ambulatory Visit: Payer: BLUE CROSS/BLUE SHIELD | Admitting: Occupational Therapy

## 2016-12-02 ENCOUNTER — Telehealth: Payer: Self-pay | Admitting: Occupational Therapy

## 2016-12-02 NOTE — Telephone Encounter (Addendum)
Therapist left pt a message to please call to confirm that she plans to attend her next therapy appointment on March 6th, as she no showed for today's appointment and has not been seen in 30 days. Therapist also stated that if pt does not confirm attendance for next appointment, pt's remaining appointments will be cancelled as pt has not been seen in 30 days.

## 2016-12-06 ENCOUNTER — Ambulatory Visit: Payer: BLUE CROSS/BLUE SHIELD | Admitting: Physical Therapy

## 2016-12-06 ENCOUNTER — Ambulatory Visit: Payer: BLUE CROSS/BLUE SHIELD | Admitting: Occupational Therapy

## 2016-12-07 DIAGNOSIS — Z79899 Other long term (current) drug therapy: Secondary | ICD-10-CM | POA: Diagnosis not present

## 2016-12-07 DIAGNOSIS — R942 Abnormal results of pulmonary function studies: Secondary | ICD-10-CM | POA: Diagnosis not present

## 2016-12-07 DIAGNOSIS — J9811 Atelectasis: Secondary | ICD-10-CM | POA: Diagnosis not present

## 2016-12-07 DIAGNOSIS — Z6832 Body mass index (BMI) 32.0-32.9, adult: Secondary | ICD-10-CM | POA: Diagnosis not present

## 2016-12-07 DIAGNOSIS — R0602 Shortness of breath: Secondary | ICD-10-CM | POA: Diagnosis not present

## 2016-12-07 DIAGNOSIS — R079 Chest pain, unspecified: Secondary | ICD-10-CM | POA: Diagnosis not present

## 2016-12-07 DIAGNOSIS — R918 Other nonspecific abnormal finding of lung field: Secondary | ICD-10-CM | POA: Diagnosis not present

## 2016-12-07 DIAGNOSIS — F329 Major depressive disorder, single episode, unspecified: Secondary | ICD-10-CM | POA: Diagnosis not present

## 2016-12-08 ENCOUNTER — Ambulatory Visit: Payer: BLUE CROSS/BLUE SHIELD | Admitting: Physical Therapy

## 2016-12-08 ENCOUNTER — Ambulatory Visit: Payer: BLUE CROSS/BLUE SHIELD | Admitting: Occupational Therapy

## 2016-12-13 ENCOUNTER — Encounter: Payer: Self-pay | Admitting: Occupational Therapy

## 2016-12-13 ENCOUNTER — Encounter: Payer: BLUE CROSS/BLUE SHIELD | Admitting: Occupational Therapy

## 2016-12-13 ENCOUNTER — Ambulatory Visit: Payer: BLUE CROSS/BLUE SHIELD | Admitting: Physical Therapy

## 2016-12-13 NOTE — Therapy (Signed)
[facility] [address] [place], [place], [address] Phone: [phone]   Fax:  [phone]  Patient Details  Name: Denise Avila MRN: [id number] Date of Birth: 04/25/1976 Referring Provider:  No ref. provider found  Encounter Date: 12/13/2016     OT Short Term Goals - 10/25/16 1338      OT SHORT TERM GOAL #1   Title I with inital HEP.   Time 4   Period Weeks   Status met     OT SHORT TERM GOAL #2   Title Pt will verbalize understanding of energy conservation strategies/ activity modification to decrease pain and increase independence with ADLs   Time 4   Period Weeks   Status Partially met     OT SHORT TERM GOAL #3   Title Pt will verbalize understanding of compensatory strategies for cognitive deficits including decreased short term memory as well as  ways to keep thinking skills sharp.   Time 4   Period Weeks   Status Partially met     OT SHORT TERM GOAL #4   Title Pt will consistently perform all basic ADLs at a modified independent level.   Time 4   Period Weeks   Status Not tested            OT Long Term Goals - 10/25/16 1340      OT LONG TERM GOAL #1   Title Pt will perform basic cooking and mod complex home management at a modified independent level.   Time 8   Period Weeks   Status Not tested     OT LONG TERM GOAL #2   Title Pt will increase bilateral grip stength by 5 lbs for increased ease with ADLs/IADLs.   Time 8   Period Weeks   Status not tested     OT LONG TERM GOAL #3   Title Pt will increase bilateral standing functional reach to 10 inches or greater for improved safety with IADLs.   Time 8   Period Weeks   Status Not tested     OT LONG TERM GOAL #4   Title Pt will verbalize understanding of compensatory strategies for visual impairment for increased ease with reading.   Time 8   Period Weeks- Not tested   Status                    OCCUPATIONAL THERAPY  DISCHARGE SUMMARY  Visits from Start of Care: 3  Current functional level related to goals / functional outcomes: Pt progress was limited by inconsistent attendance to therapy. Pt did not fully meet goals as she is experiencing new medical issues and requests d/c at this time.   Remaining deficits: Decreased strength, decreased endurance, visual perceptual deficits   Education / Equipment: Pt was educated regarding a vision HEP, energy conservation techniques and memory compensations. Pt verbalizes understanding.  Plan: Patient agrees to discharge.  Patient goals were not met. Patient is being discharged due to a change in medical status.  ?????      Karin Golden, OTR/L [phone] Phone: [phone] 10:40 AM 12/13/16 12/13/2016, 10:33 AM  [facility] [address] [place], [place], [address] Phone: [phone]   Fax:  [phone]

## 2016-12-15 ENCOUNTER — Ambulatory Visit: Payer: BLUE CROSS/BLUE SHIELD | Admitting: Physical Therapy

## 2016-12-15 ENCOUNTER — Encounter: Payer: BLUE CROSS/BLUE SHIELD | Admitting: Occupational Therapy

## 2016-12-16 DIAGNOSIS — R0602 Shortness of breath: Secondary | ICD-10-CM | POA: Diagnosis not present

## 2016-12-19 DIAGNOSIS — R0602 Shortness of breath: Secondary | ICD-10-CM | POA: Diagnosis not present

## 2016-12-20 ENCOUNTER — Encounter: Payer: BLUE CROSS/BLUE SHIELD | Admitting: Occupational Therapy

## 2016-12-20 ENCOUNTER — Ambulatory Visit: Payer: BLUE CROSS/BLUE SHIELD | Admitting: Physical Therapy

## 2016-12-22 ENCOUNTER — Ambulatory Visit
Admission: RE | Admit: 2016-12-22 | Discharge: 2016-12-22 | Disposition: A | Discharge status: Home / Self Care | Payer: BLUE CROSS/BLUE SHIELD | Source: Ambulatory Visit | Attending: Physician Assistant | Admitting: Physician Assistant

## 2016-12-22 ENCOUNTER — Encounter: Payer: BLUE CROSS/BLUE SHIELD | Admitting: Occupational Therapy

## 2016-12-22 ENCOUNTER — Other Ambulatory Visit: Payer: Self-pay | Admitting: Physician Assistant

## 2016-12-22 ENCOUNTER — Ambulatory Visit: Payer: BLUE CROSS/BLUE SHIELD | Admitting: Physical Therapy

## 2016-12-22 DIAGNOSIS — R52 Pain, unspecified: Secondary | ICD-10-CM

## 2016-12-22 DIAGNOSIS — F419 Anxiety disorder, unspecified: Secondary | ICD-10-CM | POA: Diagnosis not present

## 2016-12-22 DIAGNOSIS — T1490XA Injury, unspecified, initial encounter: Secondary | ICD-10-CM

## 2016-12-22 DIAGNOSIS — S8992XA Unspecified injury of left lower leg, initial encounter: Secondary | ICD-10-CM | POA: Diagnosis not present

## 2016-12-22 DIAGNOSIS — M25562 Pain in left knee: Secondary | ICD-10-CM | POA: Diagnosis not present

## 2016-12-22 DIAGNOSIS — F329 Major depressive disorder, single episode, unspecified: Secondary | ICD-10-CM | POA: Diagnosis not present

## 2016-12-22 DIAGNOSIS — Z0000 Encounter for general adult medical examination without abnormal findings: Secondary | ICD-10-CM | POA: Diagnosis not present

## 2016-12-23 ENCOUNTER — Other Ambulatory Visit: Payer: Self-pay | Admitting: Physician Assistant

## 2016-12-23 DIAGNOSIS — Z1231 Encounter for screening mammogram for malignant neoplasm of breast: Secondary | ICD-10-CM

## 2016-12-23 DIAGNOSIS — R5381 Other malaise: Secondary | ICD-10-CM

## 2016-12-27 ENCOUNTER — Ambulatory Visit: Payer: BLUE CROSS/BLUE SHIELD | Admitting: Physical Therapy

## 2016-12-27 ENCOUNTER — Encounter: Payer: BLUE CROSS/BLUE SHIELD | Admitting: Occupational Therapy

## 2016-12-28 DIAGNOSIS — D445 Neoplasm of uncertain behavior of pineal gland: Secondary | ICD-10-CM | POA: Diagnosis not present

## 2016-12-28 DIAGNOSIS — I951 Orthostatic hypotension: Secondary | ICD-10-CM | POA: Diagnosis not present

## 2016-12-28 DIAGNOSIS — R51 Headache: Secondary | ICD-10-CM | POA: Diagnosis not present

## 2016-12-28 DIAGNOSIS — R942 Abnormal results of pulmonary function studies: Secondary | ICD-10-CM | POA: Diagnosis not present

## 2016-12-28 DIAGNOSIS — Z8249 Family history of ischemic heart disease and other diseases of the circulatory system: Secondary | ICD-10-CM | POA: Diagnosis not present

## 2016-12-28 DIAGNOSIS — Z6832 Body mass index (BMI) 32.0-32.9, adult: Secondary | ICD-10-CM | POA: Diagnosis not present

## 2016-12-28 DIAGNOSIS — Z9049 Acquired absence of other specified parts of digestive tract: Secondary | ICD-10-CM | POA: Diagnosis not present

## 2016-12-28 DIAGNOSIS — Z880 Allergy status to penicillin: Secondary | ICD-10-CM | POA: Diagnosis not present

## 2016-12-28 DIAGNOSIS — F419 Anxiety disorder, unspecified: Secondary | ICD-10-CM | POA: Diagnosis not present

## 2016-12-28 DIAGNOSIS — G43019 Migraine without aura, intractable, without status migrainosus: Secondary | ICD-10-CM | POA: Diagnosis not present

## 2016-12-28 DIAGNOSIS — F329 Major depressive disorder, single episode, unspecified: Secondary | ICD-10-CM | POA: Diagnosis not present

## 2016-12-29 ENCOUNTER — Ambulatory Visit: Payer: BLUE CROSS/BLUE SHIELD | Admitting: Physical Therapy

## 2016-12-29 ENCOUNTER — Encounter: Payer: BLUE CROSS/BLUE SHIELD | Admitting: Occupational Therapy

## 2017-01-03 DIAGNOSIS — M25562 Pain in left knee: Secondary | ICD-10-CM | POA: Diagnosis not present

## 2017-01-03 DIAGNOSIS — M25561 Pain in right knee: Secondary | ICD-10-CM | POA: Diagnosis not present

## 2017-01-03 DIAGNOSIS — M1711 Unilateral primary osteoarthritis, right knee: Secondary | ICD-10-CM | POA: Diagnosis not present

## 2017-01-03 DIAGNOSIS — G8929 Other chronic pain: Secondary | ICD-10-CM | POA: Diagnosis not present

## 2017-01-03 DIAGNOSIS — M170 Bilateral primary osteoarthritis of knee: Secondary | ICD-10-CM | POA: Diagnosis not present

## 2017-01-04 DIAGNOSIS — Z980 Intestinal bypass and anastomosis status: Secondary | ICD-10-CM | POA: Diagnosis not present

## 2017-01-04 DIAGNOSIS — Z79899 Other long term (current) drug therapy: Secondary | ICD-10-CM | POA: Diagnosis not present

## 2017-01-04 DIAGNOSIS — R7982 Elevated C-reactive protein (CRP): Secondary | ICD-10-CM | POA: Diagnosis not present

## 2017-01-04 DIAGNOSIS — R490 Dysphonia: Secondary | ICD-10-CM | POA: Diagnosis not present

## 2017-01-04 DIAGNOSIS — R635 Abnormal weight gain: Secondary | ICD-10-CM | POA: Diagnosis not present

## 2017-01-04 DIAGNOSIS — R197 Diarrhea, unspecified: Secondary | ICD-10-CM | POA: Diagnosis not present

## 2017-01-04 DIAGNOSIS — Z9049 Acquired absence of other specified parts of digestive tract: Secondary | ICD-10-CM | POA: Diagnosis not present

## 2017-01-04 DIAGNOSIS — R112 Nausea with vomiting, unspecified: Secondary | ICD-10-CM | POA: Diagnosis not present

## 2017-01-04 DIAGNOSIS — Z6832 Body mass index (BMI) 32.0-32.9, adult: Secondary | ICD-10-CM | POA: Diagnosis not present

## 2017-01-04 DIAGNOSIS — R0602 Shortness of breath: Secondary | ICD-10-CM | POA: Diagnosis not present

## 2017-01-05 DIAGNOSIS — R112 Nausea with vomiting, unspecified: Secondary | ICD-10-CM | POA: Diagnosis not present

## 2017-01-05 DIAGNOSIS — G939 Disorder of brain, unspecified: Secondary | ICD-10-CM | POA: Diagnosis not present

## 2017-01-05 DIAGNOSIS — R197 Diarrhea, unspecified: Secondary | ICD-10-CM | POA: Diagnosis not present

## 2017-01-05 DIAGNOSIS — Z6832 Body mass index (BMI) 32.0-32.9, adult: Secondary | ICD-10-CM | POA: Diagnosis not present

## 2017-01-05 DIAGNOSIS — E348 Other specified endocrine disorders: Secondary | ICD-10-CM | POA: Diagnosis not present

## 2017-01-05 DIAGNOSIS — D445 Neoplasm of uncertain behavior of pineal gland: Secondary | ICD-10-CM | POA: Diagnosis not present

## 2017-01-09 DIAGNOSIS — R112 Nausea with vomiting, unspecified: Secondary | ICD-10-CM | POA: Diagnosis not present

## 2017-01-09 DIAGNOSIS — R197 Diarrhea, unspecified: Secondary | ICD-10-CM | POA: Diagnosis not present

## 2017-01-11 ENCOUNTER — Ambulatory Visit: Payer: BLUE CROSS/BLUE SHIELD

## 2017-01-16 DIAGNOSIS — R197 Diarrhea, unspecified: Secondary | ICD-10-CM | POA: Diagnosis not present

## 2017-01-16 DIAGNOSIS — E2740 Unspecified adrenocortical insufficiency: Secondary | ICD-10-CM | POA: Diagnosis not present

## 2017-01-18 DIAGNOSIS — R0602 Shortness of breath: Secondary | ICD-10-CM | POA: Diagnosis not present

## 2017-01-18 DIAGNOSIS — F419 Anxiety disorder, unspecified: Secondary | ICD-10-CM | POA: Diagnosis not present

## 2017-01-18 DIAGNOSIS — Z6833 Body mass index (BMI) 33.0-33.9, adult: Secondary | ICD-10-CM | POA: Diagnosis not present

## 2017-01-18 DIAGNOSIS — R51 Headache: Secondary | ICD-10-CM | POA: Diagnosis not present

## 2017-01-18 DIAGNOSIS — Z880 Allergy status to penicillin: Secondary | ICD-10-CM | POA: Diagnosis not present

## 2017-01-18 DIAGNOSIS — R5383 Other fatigue: Secondary | ICD-10-CM | POA: Diagnosis not present

## 2017-01-18 DIAGNOSIS — Z90710 Acquired absence of both cervix and uterus: Secondary | ICD-10-CM | POA: Diagnosis not present

## 2017-01-18 DIAGNOSIS — R42 Dizziness and giddiness: Secondary | ICD-10-CM | POA: Diagnosis not present

## 2017-01-18 DIAGNOSIS — Z79899 Other long term (current) drug therapy: Secondary | ICD-10-CM | POA: Diagnosis not present

## 2017-01-18 DIAGNOSIS — F329 Major depressive disorder, single episode, unspecified: Secondary | ICD-10-CM | POA: Diagnosis not present

## 2017-01-18 DIAGNOSIS — R079 Chest pain, unspecified: Secondary | ICD-10-CM | POA: Diagnosis not present

## 2017-01-18 DIAGNOSIS — M7989 Other specified soft tissue disorders: Secondary | ICD-10-CM | POA: Diagnosis not present

## 2017-01-18 DIAGNOSIS — R490 Dysphonia: Secondary | ICD-10-CM | POA: Diagnosis not present

## 2017-01-18 DIAGNOSIS — E669 Obesity, unspecified: Secondary | ICD-10-CM | POA: Diagnosis not present

## 2017-01-23 DIAGNOSIS — R0602 Shortness of breath: Secondary | ICD-10-CM | POA: Diagnosis not present

## 2017-01-25 ENCOUNTER — Ambulatory Visit
Admission: RE | Admit: 2017-01-25 | Discharge: 2017-01-25 | Disposition: A | Discharge status: Home / Self Care | Payer: BLUE CROSS/BLUE SHIELD | Source: Ambulatory Visit | Attending: Physician Assistant | Admitting: Physician Assistant

## 2017-01-25 DIAGNOSIS — Z1231 Encounter for screening mammogram for malignant neoplasm of breast: Secondary | ICD-10-CM | POA: Diagnosis not present

## 2017-01-26 ENCOUNTER — Other Ambulatory Visit: Payer: Self-pay | Admitting: Physician Assistant

## 2017-01-26 DIAGNOSIS — R928 Other abnormal and inconclusive findings on diagnostic imaging of breast: Secondary | ICD-10-CM

## 2017-01-27 DIAGNOSIS — M1711 Unilateral primary osteoarthritis, right knee: Secondary | ICD-10-CM | POA: Diagnosis not present

## 2017-01-27 DIAGNOSIS — M25561 Pain in right knee: Secondary | ICD-10-CM | POA: Diagnosis not present

## 2017-01-27 DIAGNOSIS — M1712 Unilateral primary osteoarthritis, left knee: Secondary | ICD-10-CM | POA: Diagnosis not present

## 2017-01-27 DIAGNOSIS — M25562 Pain in left knee: Secondary | ICD-10-CM | POA: Diagnosis not present

## 2017-01-27 DIAGNOSIS — G8929 Other chronic pain: Secondary | ICD-10-CM | POA: Diagnosis not present

## 2017-01-31 ENCOUNTER — Ambulatory Visit
Admission: RE | Admit: 2017-01-31 | Discharge: 2017-01-31 | Disposition: A | Discharge status: Home / Self Care | Payer: BLUE CROSS/BLUE SHIELD | Source: Ambulatory Visit | Attending: Physician Assistant | Admitting: Physician Assistant

## 2017-01-31 DIAGNOSIS — M25561 Pain in right knee: Secondary | ICD-10-CM | POA: Diagnosis not present

## 2017-01-31 DIAGNOSIS — G8929 Other chronic pain: Secondary | ICD-10-CM | POA: Diagnosis not present

## 2017-01-31 DIAGNOSIS — N6002 Solitary cyst of left breast: Secondary | ICD-10-CM | POA: Diagnosis not present

## 2017-01-31 DIAGNOSIS — R928 Other abnormal and inconclusive findings on diagnostic imaging of breast: Secondary | ICD-10-CM

## 2017-01-31 DIAGNOSIS — M1711 Unilateral primary osteoarthritis, right knee: Secondary | ICD-10-CM | POA: Diagnosis not present

## 2017-01-31 DIAGNOSIS — M1712 Unilateral primary osteoarthritis, left knee: Secondary | ICD-10-CM | POA: Diagnosis not present

## 2017-02-07 DIAGNOSIS — G8929 Other chronic pain: Secondary | ICD-10-CM | POA: Diagnosis not present

## 2017-02-07 DIAGNOSIS — M25562 Pain in left knee: Secondary | ICD-10-CM | POA: Diagnosis not present

## 2017-02-07 DIAGNOSIS — M25561 Pain in right knee: Secondary | ICD-10-CM | POA: Diagnosis not present

## 2017-02-07 DIAGNOSIS — M1712 Unilateral primary osteoarthritis, left knee: Secondary | ICD-10-CM | POA: Diagnosis not present

## 2017-02-07 DIAGNOSIS — M1711 Unilateral primary osteoarthritis, right knee: Secondary | ICD-10-CM | POA: Diagnosis not present

## 2017-02-09 DIAGNOSIS — R079 Chest pain, unspecified: Secondary | ICD-10-CM | POA: Diagnosis not present

## 2017-02-09 DIAGNOSIS — R5383 Other fatigue: Secondary | ICD-10-CM | POA: Diagnosis not present

## 2017-02-09 DIAGNOSIS — B370 Candidal stomatitis: Secondary | ICD-10-CM | POA: Diagnosis not present

## 2017-02-09 DIAGNOSIS — N200 Calculus of kidney: Secondary | ICD-10-CM | POA: Diagnosis not present

## 2017-02-09 DIAGNOSIS — F419 Anxiety disorder, unspecified: Secondary | ICD-10-CM | POA: Diagnosis not present

## 2017-02-09 DIAGNOSIS — E2740 Unspecified adrenocortical insufficiency: Secondary | ICD-10-CM | POA: Diagnosis not present

## 2017-02-09 DIAGNOSIS — R51 Headache: Secondary | ICD-10-CM | POA: Diagnosis not present

## 2017-02-09 DIAGNOSIS — K529 Noninfective gastroenteritis and colitis, unspecified: Secondary | ICD-10-CM | POA: Diagnosis not present

## 2017-02-09 DIAGNOSIS — N390 Urinary tract infection, site not specified: Secondary | ICD-10-CM | POA: Diagnosis not present

## 2017-02-09 DIAGNOSIS — E010 Iodine-deficiency related diffuse (endemic) goiter: Secondary | ICD-10-CM | POA: Diagnosis not present

## 2017-02-09 DIAGNOSIS — H547 Unspecified visual loss: Secondary | ICD-10-CM | POA: Diagnosis not present

## 2017-02-09 DIAGNOSIS — R6883 Chills (without fever): Secondary | ICD-10-CM | POA: Diagnosis not present

## 2017-02-09 DIAGNOSIS — R490 Dysphonia: Secondary | ICD-10-CM | POA: Diagnosis not present

## 2017-02-09 DIAGNOSIS — J029 Acute pharyngitis, unspecified: Secondary | ICD-10-CM | POA: Diagnosis not present

## 2017-02-09 DIAGNOSIS — Z6833 Body mass index (BMI) 33.0-33.9, adult: Secondary | ICD-10-CM | POA: Diagnosis not present

## 2017-02-09 DIAGNOSIS — R42 Dizziness and giddiness: Secondary | ICD-10-CM | POA: Diagnosis not present

## 2017-02-09 DIAGNOSIS — F329 Major depressive disorder, single episode, unspecified: Secondary | ICD-10-CM | POA: Diagnosis not present

## 2017-02-13 DIAGNOSIS — N132 Hydronephrosis with renal and ureteral calculous obstruction: Secondary | ICD-10-CM | POA: Diagnosis not present

## 2017-02-13 DIAGNOSIS — R490 Dysphonia: Secondary | ICD-10-CM | POA: Diagnosis not present

## 2017-02-13 DIAGNOSIS — F419 Anxiety disorder, unspecified: Secondary | ICD-10-CM | POA: Diagnosis not present

## 2017-02-13 DIAGNOSIS — F444 Conversion disorder with motor symptom or deficit: Secondary | ICD-10-CM | POA: Diagnosis not present

## 2017-02-13 DIAGNOSIS — F329 Major depressive disorder, single episode, unspecified: Secondary | ICD-10-CM | POA: Diagnosis not present

## 2017-02-13 DIAGNOSIS — N939 Abnormal uterine and vaginal bleeding, unspecified: Secondary | ICD-10-CM | POA: Diagnosis not present

## 2017-02-13 DIAGNOSIS — R112 Nausea with vomiting, unspecified: Secondary | ICD-10-CM | POA: Diagnosis not present

## 2017-02-13 DIAGNOSIS — N809 Endometriosis, unspecified: Secondary | ICD-10-CM | POA: Diagnosis not present

## 2017-02-13 DIAGNOSIS — R51 Headache: Secondary | ICD-10-CM | POA: Diagnosis not present

## 2017-02-13 DIAGNOSIS — R109 Unspecified abdominal pain: Secondary | ICD-10-CM | POA: Diagnosis not present

## 2017-02-13 DIAGNOSIS — E440 Moderate protein-calorie malnutrition: Secondary | ICD-10-CM | POA: Diagnosis not present

## 2017-02-13 DIAGNOSIS — R197 Diarrhea, unspecified: Secondary | ICD-10-CM | POA: Diagnosis not present

## 2017-02-13 DIAGNOSIS — R1310 Dysphagia, unspecified: Secondary | ICD-10-CM | POA: Diagnosis not present

## 2017-02-13 DIAGNOSIS — N12 Tubulo-interstitial nephritis, not specified as acute or chronic: Secondary | ICD-10-CM | POA: Diagnosis not present

## 2017-02-13 DIAGNOSIS — Z6833 Body mass index (BMI) 33.0-33.9, adult: Secondary | ICD-10-CM | POA: Diagnosis not present

## 2017-02-13 DIAGNOSIS — R491 Aphonia: Secondary | ICD-10-CM | POA: Diagnosis not present

## 2017-02-13 DIAGNOSIS — K5900 Constipation, unspecified: Secondary | ICD-10-CM | POA: Diagnosis not present

## 2017-02-13 DIAGNOSIS — Z780 Asymptomatic menopausal state: Secondary | ICD-10-CM | POA: Diagnosis not present

## 2017-03-08 DIAGNOSIS — R110 Nausea: Secondary | ICD-10-CM | POA: Diagnosis not present

## 2017-03-08 DIAGNOSIS — R946 Abnormal results of thyroid function studies: Secondary | ICD-10-CM | POA: Diagnosis not present

## 2017-03-08 DIAGNOSIS — Z6834 Body mass index (BMI) 34.0-34.9, adult: Secondary | ICD-10-CM | POA: Diagnosis not present

## 2017-03-08 DIAGNOSIS — R942 Abnormal results of pulmonary function studies: Secondary | ICD-10-CM | POA: Diagnosis not present

## 2017-03-08 DIAGNOSIS — Z6833 Body mass index (BMI) 33.0-33.9, adult: Secondary | ICD-10-CM | POA: Diagnosis not present

## 2017-03-08 DIAGNOSIS — R5383 Other fatigue: Secondary | ICD-10-CM | POA: Diagnosis not present

## 2017-03-08 DIAGNOSIS — R0602 Shortness of breath: Secondary | ICD-10-CM | POA: Diagnosis not present

## 2017-03-08 DIAGNOSIS — F329 Major depressive disorder, single episode, unspecified: Secondary | ICD-10-CM | POA: Diagnosis not present

## 2017-03-08 DIAGNOSIS — J90 Pleural effusion, not elsewhere classified: Secondary | ICD-10-CM | POA: Diagnosis not present

## 2017-03-08 DIAGNOSIS — Z880 Allergy status to penicillin: Secondary | ICD-10-CM | POA: Diagnosis not present

## 2017-03-08 DIAGNOSIS — I951 Orthostatic hypotension: Secondary | ICD-10-CM | POA: Diagnosis not present

## 2017-03-08 DIAGNOSIS — E2740 Unspecified adrenocortical insufficiency: Secondary | ICD-10-CM | POA: Diagnosis not present

## 2017-03-08 DIAGNOSIS — R079 Chest pain, unspecified: Secondary | ICD-10-CM | POA: Diagnosis not present

## 2017-03-09 DIAGNOSIS — R946 Abnormal results of thyroid function studies: Secondary | ICD-10-CM | POA: Diagnosis not present

## 2017-03-09 DIAGNOSIS — E2740 Unspecified adrenocortical insufficiency: Secondary | ICD-10-CM | POA: Diagnosis not present

## 2017-03-10 DIAGNOSIS — I959 Hypotension, unspecified: Secondary | ICD-10-CM | POA: Diagnosis not present

## 2017-03-10 DIAGNOSIS — G930 Cerebral cysts: Secondary | ICD-10-CM | POA: Diagnosis not present

## 2017-03-10 DIAGNOSIS — Z6833 Body mass index (BMI) 33.0-33.9, adult: Secondary | ICD-10-CM | POA: Diagnosis not present

## 2017-03-10 DIAGNOSIS — Z833 Family history of diabetes mellitus: Secondary | ICD-10-CM | POA: Diagnosis not present

## 2017-03-10 DIAGNOSIS — Z8249 Family history of ischemic heart disease and other diseases of the circulatory system: Secondary | ICD-10-CM | POA: Diagnosis not present

## 2017-03-10 DIAGNOSIS — R197 Diarrhea, unspecified: Secondary | ICD-10-CM | POA: Diagnosis not present

## 2017-03-10 DIAGNOSIS — F419 Anxiety disorder, unspecified: Secondary | ICD-10-CM | POA: Diagnosis not present

## 2017-03-10 DIAGNOSIS — D445 Neoplasm of uncertain behavior of pineal gland: Secondary | ICD-10-CM | POA: Diagnosis not present

## 2017-03-10 DIAGNOSIS — R5383 Other fatigue: Secondary | ICD-10-CM | POA: Diagnosis not present

## 2017-03-10 DIAGNOSIS — Z825 Family history of asthma and other chronic lower respiratory diseases: Secondary | ICD-10-CM | POA: Diagnosis not present

## 2017-03-10 DIAGNOSIS — R55 Syncope and collapse: Secondary | ICD-10-CM | POA: Diagnosis not present

## 2017-03-10 DIAGNOSIS — E860 Dehydration: Secondary | ICD-10-CM | POA: Diagnosis not present

## 2017-03-10 DIAGNOSIS — Z90722 Acquired absence of ovaries, bilateral: Secondary | ICD-10-CM | POA: Diagnosis not present

## 2017-03-10 DIAGNOSIS — Z801 Family history of malignant neoplasm of trachea, bronchus and lung: Secondary | ICD-10-CM | POA: Diagnosis not present

## 2017-03-10 DIAGNOSIS — F329 Major depressive disorder, single episode, unspecified: Secondary | ICD-10-CM | POA: Diagnosis not present

## 2017-03-10 DIAGNOSIS — Z9049 Acquired absence of other specified parts of digestive tract: Secondary | ICD-10-CM | POA: Diagnosis not present

## 2017-03-10 DIAGNOSIS — G43909 Migraine, unspecified, not intractable, without status migrainosus: Secondary | ICD-10-CM | POA: Diagnosis not present

## 2017-03-10 DIAGNOSIS — R51 Headache: Secondary | ICD-10-CM | POA: Diagnosis not present

## 2017-03-27 DIAGNOSIS — M5416 Radiculopathy, lumbar region: Secondary | ICD-10-CM | POA: Diagnosis not present

## 2017-03-27 DIAGNOSIS — M25562 Pain in left knee: Secondary | ICD-10-CM | POA: Diagnosis not present

## 2017-03-27 DIAGNOSIS — M222X9 Patellofemoral disorders, unspecified knee: Secondary | ICD-10-CM | POA: Diagnosis not present

## 2017-03-27 DIAGNOSIS — M170 Bilateral primary osteoarthritis of knee: Secondary | ICD-10-CM | POA: Diagnosis not present

## 2017-03-27 DIAGNOSIS — M25561 Pain in right knee: Secondary | ICD-10-CM | POA: Diagnosis not present

## 2017-03-29 DIAGNOSIS — Z6832 Body mass index (BMI) 32.0-32.9, adult: Secondary | ICD-10-CM | POA: Diagnosis not present

## 2017-03-29 DIAGNOSIS — I951 Orthostatic hypotension: Secondary | ICD-10-CM | POA: Diagnosis not present

## 2017-04-04 DIAGNOSIS — M222X9 Patellofemoral disorders, unspecified knee: Secondary | ICD-10-CM | POA: Diagnosis not present

## 2017-04-04 DIAGNOSIS — M25562 Pain in left knee: Secondary | ICD-10-CM | POA: Diagnosis not present

## 2017-04-21 ENCOUNTER — Encounter (HOSPITAL_COMMUNITY): Payer: Self-pay | Admitting: Emergency Medicine

## 2017-04-21 ENCOUNTER — Emergency Department (HOSPITAL_COMMUNITY)
Admission: EM | Admit: 2017-04-21 | Discharge: 2017-04-21 | Disposition: A | Discharge status: Home / Self Care | Payer: BLUE CROSS/BLUE SHIELD | Attending: Emergency Medicine | Admitting: Emergency Medicine

## 2017-04-21 ENCOUNTER — Emergency Department (HOSPITAL_COMMUNITY): Payer: BLUE CROSS/BLUE SHIELD

## 2017-04-21 DIAGNOSIS — R112 Nausea with vomiting, unspecified: Secondary | ICD-10-CM | POA: Diagnosis not present

## 2017-04-21 DIAGNOSIS — Z79899 Other long term (current) drug therapy: Secondary | ICD-10-CM | POA: Insufficient documentation

## 2017-04-21 DIAGNOSIS — R1033 Periumbilical pain: Secondary | ICD-10-CM | POA: Diagnosis not present

## 2017-04-21 DIAGNOSIS — R079 Chest pain, unspecified: Secondary | ICD-10-CM | POA: Diagnosis not present

## 2017-04-21 DIAGNOSIS — G43011 Migraine without aura, intractable, with status migrainosus: Secondary | ICD-10-CM | POA: Diagnosis not present

## 2017-04-21 DIAGNOSIS — G8929 Other chronic pain: Secondary | ICD-10-CM

## 2017-04-21 DIAGNOSIS — R197 Diarrhea, unspecified: Secondary | ICD-10-CM | POA: Insufficient documentation

## 2017-04-21 DIAGNOSIS — R0789 Other chest pain: Secondary | ICD-10-CM | POA: Diagnosis not present

## 2017-04-21 DIAGNOSIS — R51 Headache: Secondary | ICD-10-CM | POA: Diagnosis not present

## 2017-04-21 DIAGNOSIS — M2242 Chondromalacia patellae, left knee: Secondary | ICD-10-CM | POA: Diagnosis not present

## 2017-04-21 DIAGNOSIS — G43001 Migraine without aura, not intractable, with status migrainosus: Secondary | ICD-10-CM | POA: Diagnosis not present

## 2017-04-21 LAB — COMPREHENSIVE METABOLIC PANEL
ALK PHOS: 69 U/L (ref 38–126)
ALT: 33 U/L (ref 14–54)
ANION GAP: 11 (ref 5–15)
AST: 29 U/L (ref 15–41)
Albumin: 3.9 g/dL (ref 3.5–5.0)
BILIRUBIN TOTAL: 0.4 mg/dL (ref 0.3–1.2)
BUN: 15 mg/dL (ref 6–20)
CALCIUM: 8.8 mg/dL — AB (ref 8.9–10.3)
CO2: 22 mmol/L (ref 22–32)
CREATININE: 0.69 mg/dL (ref 0.44–1.00)
Chloride: 108 mmol/L (ref 101–111)
GFR calc non Af Amer: >60 mL/min (ref >60)
Glucose, Bld: 91 mg/dL (ref 65–99)
Potassium: 4.2 mmol/L (ref 3.5–5.1)
Sodium: 141 mmol/L (ref 135–145)
TOTAL PROTEIN: 6.7 g/dL (ref 6.5–8.1)

## 2017-04-21 LAB — CBC WITH DIFFERENTIAL/PLATELET
Basophils Absolute: 0 10*3/uL (ref 0.0–0.1)
Basophils Relative: 0 %
Eosinophils Absolute: 0.1 10*3/uL (ref 0.0–0.7)
Eosinophils Relative: 2 %
HEMATOCRIT: 33 % — AB (ref 36.0–46.0)
HEMOGLOBIN: 10.6 g/dL — AB (ref 12.0–15.0)
LYMPHS ABS: 1.4 10*3/uL (ref 0.7–4.0)
LYMPHS PCT: 25 %
MCH: 27 pg (ref 26.0–34.0)
MCHC: 32.1 g/dL (ref 30.0–36.0)
MCV: 84.2 fL (ref 78.0–100.0)
MONOS PCT: 5 %
Monocytes Absolute: 0.3 10*3/uL (ref 0.1–1.0)
NEUTROS ABS: 4 10*3/uL (ref 1.7–7.7)
NEUTROS PCT: 68 %
Platelets: 181 10*3/uL (ref 150–400)
RBC: 3.92 MIL/uL (ref 3.87–5.11)
RDW: 13.6 % (ref 11.5–15.5)
WBC: 5.8 10*3/uL (ref 4.0–10.5)

## 2017-04-21 LAB — URINALYSIS, ROUTINE W REFLEX MICROSCOPIC
Bilirubin Urine: NEGATIVE
Glucose, UA: NEGATIVE mg/dL
Ketones, ur: NEGATIVE mg/dL
Leukocytes, UA: NEGATIVE
NITRITE: NEGATIVE
PROTEIN: NEGATIVE mg/dL
SPECIFIC GRAVITY, URINE: 1.002 — AB (ref 1.005–1.030)
pH: 6 (ref 5.0–8.0)

## 2017-04-21 LAB — LIPASE, BLOOD: Lipase: 47 U/L (ref 11–51)

## 2017-04-21 MED ORDER — KETOROLAC TROMETHAMINE 30 MG/ML IJ SOLN
15.0000 mg | Freq: Once | INTRAMUSCULAR | Status: AC
  Administered 2017-04-21: 15 mg via INTRAVENOUS
  Filled 2017-04-21: qty 1

## 2017-04-21 MED ORDER — HALOPERIDOL LACTATE 5 MG/ML IJ SOLN
4.0000 mg | Freq: Once | INTRAMUSCULAR | Status: AC
  Administered 2017-04-21: 4 mg via INTRAVENOUS
  Filled 2017-04-21: qty 1

## 2017-04-21 MED ORDER — DIPHENHYDRAMINE HCL 50 MG/ML IJ SOLN
25.0000 mg | Freq: Once | INTRAMUSCULAR | Status: AC
  Administered 2017-04-21: 25 mg via INTRAVENOUS
  Filled 2017-04-21: qty 1

## 2017-04-21 MED ORDER — MORPHINE SULFATE (PF) 4 MG/ML IV SOLN
4.0000 mg | Freq: Once | INTRAVENOUS | Status: AC
  Administered 2017-04-21: 4 mg via INTRAVENOUS
  Filled 2017-04-21: qty 1

## 2017-04-21 MED ORDER — BUPIVACAINE HCL (PF) 0.5 % IJ SOLN
30.0000 mL | Freq: Once | INTRAMUSCULAR | Status: AC
Start: 2017-04-21 — End: 2017-04-21
  Administered 2017-04-21: 30 mL
  Filled 2017-04-21: qty 30

## 2017-04-21 MED ORDER — BUPIVACAINE HCL 0.5 % IJ SOLN: 50.0000 mL | Freq: Once | INTRAMUSCULAR | Status: DC

## 2017-04-21 MED ORDER — SODIUM CHLORIDE 0.9 % IV BOLUS (SEPSIS)
1000.0000 mL | Freq: Once | INTRAVENOUS | Status: AC
  Administered 2017-04-21: 1000 mL via INTRAVENOUS

## 2017-04-21 MED ORDER — METOCLOPRAMIDE HCL 5 MG/ML IJ SOLN
10.0000 mg | Freq: Once | INTRAMUSCULAR | Status: AC
  Administered 2017-04-21: 10 mg via INTRAVENOUS
  Filled 2017-04-21: qty 2

## 2017-04-21 MED ORDER — DEXAMETHASONE SODIUM PHOSPHATE 10 MG/ML IJ SOLN
10.0000 mg | Freq: Once | INTRAMUSCULAR | Status: AC
Start: 2017-04-21 — End: 2017-04-21
  Administered 2017-04-21: 10 mg via INTRAVENOUS
  Filled 2017-04-21: qty 1

## 2017-04-21 NOTE — ED Notes (Signed)
Pt stated she is a" hard stick". Pt was looked at for a vein by phlebotomist, a vein was not found. Pt stated 'iv team has to give her an iv when needed". Pt was told to wait in lobby with no attempt at blood draw.

## 2017-04-21 NOTE — ED Triage Notes (Signed)
Pt states she has had a migraine x 5 days along with abdominal pain, diarrhea and chest pain. Hx of colon resection. Alert and oriented.

## 2017-04-21 NOTE — ED Notes (Signed)
I have called the lab House Phlebotomist for assistance with lab draw for pt. I was able to obtain IV access with lab draw.

## 2017-04-21 NOTE — Discharge Instructions (Signed)
Call [facility] and [facility] to see if you can get an appt sooner than October.  Return for worsening symptoms

## 2017-04-21 NOTE — ED Provider Notes (Signed)
[facility] DEPT Provider Note   CSN: [id number] Arrival date & time: 04/21/17  1344     History   Chief Complaint Chief Complaint  Patient presents with  . Migraine  . Chest Pain  . Abdominal Pain    HPI Denise Avila is a 41 y.o. female who presents with a headache, chest pain, abdominal pain. PMH significant for chronic headaches, chronic chest pain, orthostasis, colonic inertia s/p total abdominal colectomy with ileorectal anastomosis.   Headache: She states that she has had a "migraine" for 5 days. She sees a neurologist in [place] for this. She states that it has been constant since then and consistent with previous migraines just "worse". She reports associated photophobia and N/V. She states she has had increased falls as well. She has taken home meds without relief. Unfortunately her neurologist is moving and she doesn't have an appt with headache clinic until October. She cannot tolerate a lot of medicines.  Chest pain: She also has chest pain which is across her chest from right upper chest to left lower. This is chronic but "worse". She has frequent falls due to her orthostasis, she reports. She has seen cardiology for orthostasis. EF is normal and they recommended staying well hydrated and wearing compression stockings.  Abdominal pain: She has abdominal pain as well which is chronic due to colon resection and chronic diarrhea. It is intermittent, and peri-umbilical. She reports frequent diarrhea - up to 20 times a day at baseline but says she may be dehydrated due to diarrhea and now multiple episodes of vomiting. She also reports generalized swelling, especially in her legs, abdomen, and arms, which she has chronically but is also worse. No fever, LOC, SOB, dysuria.  HPI  Past Medical History:  Diagnosis Date  . Abnormal Pap smear of cervix 12/2014  . Depression   . Endometriosis   . Fibroids    age 65  . History of kidney stones   . Migraines   .  Pleurisy 02/2015   while hospitalized   . Skin cancer (melanoma) [facility])     Patient Active Problem List   Diagnosis Date Noted  . Vaginal cuff dehiscence 08/21/2015  . Vaginal bleeding   . Surgical menopause on hormone replacement therapy 06/22/2015  . Endometriosis of ovary 05/21/2015  . Abnormal uterine bleeding   . Endometriosis   . Pyelonephritis 02/02/2015  . Nephrolithiasis 02/02/2015  . Hydronephrosis 02/02/2015  . Left ureteral stone     Past Surgical History:  Procedure Laterality Date  . ABLATION     uterine  . CYSTOSCOPY W/ URETERAL STENT PLACEMENT Left 02/02/2015   Procedure: CYSTOSCOPY WITH RETROGRADE PYELOGRAM/URETERAL STENT PLACEMENT;  Surgeon: Festus Aloe, MD;  Location: [facility];  Service: Urology;  Laterality: Left;  . CYSTOSCOPY WITH URETEROSCOPY AND STENT PLACEMENT Left 02/13/2015   Procedure: LEFT URETEROSCOPY WITH HOLMIUM LASER AND STENT PLACEMENT;  Surgeon: Festus Aloe, MD;  Location: [facility];  Service: Urology;  Laterality: Left;  . DILATION AND CURETTAGE OF UTERUS    . HOLMIUM LASER APPLICATION N/A 01/09/8118   Procedure: HOLMIUM LASER APPLICATION;  Surgeon: Festus Aloe, MD;  Location: [facility];  Service: Urology;  Laterality: N/A;  . LAPAROSCOPY     x 4  . MELANOMA EXCISION  2000   melanoma removed from face, basal cell removed from nose  . REPAIR VAGINAL CUFF N/A 08/21/2015   Procedure: REPAIR VAGINAL CUFF, EXAM UNDER ANESTHESIA;  Surgeon: Everitt Amber, MD;  Location: [facility];  Service: Gynecology;  Laterality: N/A;  . ROBOTIC ASSISTED TOTAL HYSTERECTOMY WITH BILATERAL SALPINGO OOPHERECTOMY Bilateral 05/21/2015   Procedure: XI ROBOTIC ASSISTED TOTAL HYSTERECTOMY WITH BILATERAL SALPINGO OOPHORECTOMY;  Surgeon: Everitt Amber, MD;  Location: [facility];  Service: Gynecology;  Laterality: Bilateral;    OB History    No data available       Home Medications    Prior to Admission medications   Medication Sig Start Date End Date Taking? Authorizing  Provider  citalopram (CELEXA) 20 MG tablet Take 20 mg by mouth at bedtime.    [provider]  clonazePAM (KLONOPIN) 0.5 MG tablet Take 0.5 mg by mouth 2 (two) times daily as needed for anxiety.    [provider]  cyclobenzaprine (FLEXERIL) 10 MG tablet Take 10 mg by mouth 2 (two) times daily.  06/18/15   [provider]  diclofenac (VOLTAREN) 75 MG EC tablet Take 75 mg by mouth 2 (two) times daily.    [provider]  docusate sodium (COLACE) 100 MG capsule Take 300 mg by mouth daily.     [provider]  estrogens, conjugated, (PREMARIN) 0.625 MG tablet Take 1 tablet (0.625 mg total) by mouth daily. Take daily for 21 days then do not take for 7 days. 06/22/15   Everitt Amber, MD  ibuprofen (ADVIL,MOTRIN) 800 MG tablet TAKE ONE TABLET BY MOUTH EVERY 8 HOURS AS NEEDED FOR MILD PAIN 09/24/15   Joylene John D, NP  Multiple Vitamin (MULTIVITAMIN WITH MINERALS) TABS tablet Take 1 tablet by mouth daily. Occuvite eye + multi    [provider]  omeprazole (PRILOSEC) 20 MG capsule Take 20 mg by mouth daily.    [provider]  ondansetron (ZOFRAN) 4 MG tablet Take 1 tablet (4 mg total) by mouth every 6 (six) hours as needed for nausea. 05/22/15   Everitt Amber, MD  oxyCODONE-acetaminophen (PERCOCET/ROXICET) 5-325 MG tablet Take 1-2 tablets by mouth every 4 (four) hours as needed for severe pain. 08/22/15   Lahoma Crocker, MD  phenazopyridine (PYRIDIUM) 100 MG tablet Take 1 tablet (100 mg total) by mouth 3 (three) times daily with meals. 02/10/15   Velvet Bathe, MD  polyethylene glycol (GOLYTELY) 236 G solution Take 4,000 mLs by mouth once. 09/18/15   Everitt Amber, MD  sucralfate (CARAFATE) 1 G tablet Take 1 tablet (1 g total) by mouth 4 (four) times daily. 02/24/15   Lacretia Leigh, MD  traZODone (DESYREL) 100 MG tablet  09/07/15   [provider]    Family History Family History  Problem Relation Age of Onset  . Hypertension Mother    . Cancer - Other Father        liver  . Diabetes Mellitus II Maternal Grandmother   . Heart disease Maternal Grandmother   . Breast cancer Maternal Grandmother   . Cancer - Lung Maternal Grandfather   . Osteoporosis Paternal Grandmother   . Melanoma Paternal Grandfather     Social History Social History  Substance Use Topics  . Smoking status: Never Smoker  . Smokeless tobacco: Never Used  . Alcohol use No     Allergies   Ciprofloxacin and Penicillins   Review of Systems Review of Systems  Constitutional: Negative for fever.  Eyes: Negative for visual disturbance.  Respiratory: Negative for shortness of breath.   Cardiovascular: Positive for chest pain.  Gastrointestinal: Positive for abdominal distention, abdominal pain, diarrhea, nausea and vomiting.  Genitourinary: Negative for dysuria.  Musculoskeletal:       Generalized swelling  Neurological:  Positive for headaches. Negative for syncope and weakness.     Physical Exam Updated Vital Signs BP 139/87 (BP Location: Right Arm)   Pulse 85   Temp 97.8 F (36.6 C) (Oral)   Resp 16   Ht 6' (1.829 m)   Wt 108.9 kg (240 lb)   LMP 05/04/2015 (Approximate)   SpO2 100%   BMI 32.55 kg/m   Physical Exam  Constitutional: She is oriented to person, place, and time. She appears well-developed and well-nourished. No distress.  Obese, calm, cooperative  HENT:  Head: Normocephalic and atraumatic.  Eyes: Pupils are equal, round, and reactive to light. Conjunctivae are normal. Right eye exhibits no discharge. Left eye exhibits no discharge. No scleral icterus.  Photophobic  Neck: Normal range of motion.  Cardiovascular: Normal rate and regular rhythm.  Exam reveals no gallop and no friction rub.   No murmur heard. Pulmonary/Chest: Effort normal and breath sounds normal. No respiratory distress. She has no wheezes. She has no rales. She exhibits no tenderness.  Abdominal: Soft. Bowel sounds are normal. She exhibits no  distension and no mass. There is tenderness. There is no rebound and no guarding. No hernia.  Neurological: She is alert and oriented to person, place, and time.  Lying on stretcher in NAD. GCS 15. Speaks in a clear voice. Cranial nerves II through XII grossly intact. 5/5 strength in all extremities. Sensation fully intact.  Bilateral finger-to-nose intact.    Skin: Skin is warm and dry.  Psychiatric: She has a normal mood and affect. Her behavior is normal.  Nursing note and vitals reviewed.    ED Treatments / Results  Labs (all labs ordered are listed, but only abnormal results are displayed) Labs Reviewed  CBC WITH DIFFERENTIAL/PLATELET - Abnormal; Notable for the following:       Result Value   Hemoglobin 10.6 (*)    HCT 33.0 (*)    All other components within normal limits  COMPREHENSIVE METABOLIC PANEL - Abnormal; Notable for the following:    Calcium 8.8 (*)    All other components within normal limits  URINALYSIS, ROUTINE W REFLEX MICROSCOPIC - Abnormal; Notable for the following:    Color, Urine STRAW (*)    Specific Gravity, Urine 1.002 (*)    Hgb urine dipstick SMALL (*)    Bacteria, UA RARE (*)    Squamous Epithelial / LPF 0-5 (*)    All other components within normal limits  LIPASE, BLOOD    EKG  EKG Interpretation None       Radiology Dg Chest 2 View  Result Date: 04/21/2017 CLINICAL DATA:  Chest pain. EXAM: CHEST  2 VIEW COMPARISON:  Scout image for CT scan dated 02/24/2015 FINDINGS: The heart size and mediastinal contours are within normal limits. Both lungs are clear. The visualized skeletal structures are unremarkable. IMPRESSION: Normal chest. Electronically Signed   By: Lorriane Shire M.D.   On: 04/21/2017 14:36    Procedures Procedures (including critical care time)  A trigger point injection was performed at the site of maximal tenderness using 0.5% Marcaine. This was well tolerated, but she did not get relief of pain.  Medications Ordered in  ED Medications  ketorolac (TORADOL) 30 MG/ML injection 15 mg (15 mg Intravenous Given 04/21/17 1621)  metoCLOPramide (REGLAN) injection 10 mg (10 mg Intravenous Given 04/21/17 1617)  diphenhydrAMINE (BENADRYL) injection 25 mg (25 mg Intravenous Given 04/21/17 1617)  dexamethasone (DECADRON) injection 10 mg (10 mg Intravenous Given 04/21/17 1619)  sodium chloride 0.9 %  bolus 1,000 mL (0 mLs Intravenous Stopped 04/21/17 1804)  haloperidol lactate (HALDOL) injection 4 mg (4 mg Intravenous Given 04/21/17 1804)  bupivacaine (MARCAINE) 0.5 % injection 30 mL (30 mLs Infiltration Given 04/21/17 2005)  morphine 4 MG/ML injection 4 mg (4 mg Intravenous Given 04/21/17 2049)    Initial Impression / Assessment and Plan / ED Course  I have reviewed the triage vital signs and the nursing notes.  Pertinent labs & imaging results that were available during my care of the patient were reviewed by me and considered in my medical decision making (see chart for details).  41 year old with multiple chronic issues presents with persistent worsening HA. Will attempt migraine cocktail and reassess. She has been seen by neurology. Headache is thought to be multifactorial (migraine and TTH). Chest pain - evaluated by cardiology. Chronic in nature. Thought to be non-cardiac.  5:59 PM Not better. Will try Haldol.  8:00 PM Still not better. Discussed with Dr. Wilson Singer. Will try cervical trigger point injection.  8:30 After injection, she is not better. Morphine given. There is no improvement with Morphine either. At this point we are out of options. I advised her that she can try to f/u with either [facility] or  neuro to see if she can get in sooner. She verbalized understanding and is ok with this. Will d/c.   Final Clinical Impressions(s) / ED Diagnoses   Final diagnoses:  Intractable migraine without aura and with status migrainosus  Chronic chest pain    New Prescriptions New Prescriptions   No medications on  file     Iris Pert 04/22/17 0026    Virgel Manifold, MD 04/29/17 1309

## 2017-04-25 ENCOUNTER — Encounter: Payer: Self-pay | Admitting: Neurology

## 2017-04-25 ENCOUNTER — Ambulatory Visit (INDEPENDENT_AMBULATORY_CARE_PROVIDER_SITE_OTHER): Payer: BLUE CROSS/BLUE SHIELD | Admitting: Neurology

## 2017-04-25 VITALS — BP 105/70 | HR 86 | Ht 73.0 in | Wt 244.1 lb

## 2017-04-25 DIAGNOSIS — G43711 Chronic migraine without aura, intractable, with status migrainosus: Secondary | ICD-10-CM | POA: Diagnosis not present

## 2017-04-25 MED ORDER — KETOROLAC TROMETHAMINE 60 MG/2ML IM SOLN
60.0000 mg | Freq: Once | INTRAMUSCULAR | Status: AC
  Administered 2017-04-25: 60 mg via INTRAMUSCULAR

## 2017-04-25 NOTE — Progress Notes (Signed)
[facility]    Provider:  Dr Jaynee Eagles Referring Provider: Lennie Odor, PA-C Primary Care Physician:  Lennie Odor, PA-C  CC:  Chronic Headaches  HPI:  Denise Avila is a 41 y.o. female here as a referral from Dr. Barrie Folk for Chronic headaches. 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 Migraines started as a child or young teen migraines returned in May 2016 after procedure for kidney stones would last a couple hours take Tylenol only down. Migraines worsened after surgery with ileorectal anastomosis in August 2017 with chronic daily migraine. Headache is throbbing, right frontal, with photophobia, phonophobia, nausea and vomiting. She throws up a lot. No medication overuse. Being in bed in a dark room helps. No FHx of migraines. She has associated dizziness and falling, slurred words and loss of words with the migraines, hot/cold temperature and lights make it worse. She does not drive anymore. Vision has changed, follows with eye doctor, has tinnitus. Migraines can last for days. Last week she was in bed 5/7 days. She went to the ED last week. She has chronic neck pain. No medication overuse. No aura. Daily headaches since 2016, more than 15 a month are migrainous.   Medications tried: Cannot try propranolol (due to low low BP), Topamax/zonegran contraindicated due to nephrolithiasis and dehydration, on Lamictal, depakote contraindicated due to vomiting and prior transaminitis, already on wellbutrin, celexa, trazodone, tried magnesium with diarrhea.  On Lamictal. Tried Tizanidine without relief. On Naprosyn and Phenergan for acute management.   Reviewed notes, labs and imaging from outside physicians, which showed:  Reviewed emergency room notes patient presented July 20 for  multiple complaints including headache, chest pain, abdominal pain. Patient reported a migraine for 5 days. She is treated at [facility] neurology. Associated photophobia and nausea vomiting. Increased falls as well. All meds without relief. Unfortunately her neurologist moving and she doesn't am an appointment with a headache clinic until October. She cannot tolerate lots of medicines.  Reviewed notes from previous neurologist. Patient has a history of migraine, abdominal surgery for slow client transport, left cholelithiasis. The chronic daily headaches are multifactorial including migraine and tension type. Multiple contributing medical factors including chronic dehydration diarrhea, limited medication choices.  Reviewed labs: CMP and CBC unremarkable July 2018 has chronic anemia hemoglobin 10.6  Reviewed MRI of the brain report April 2018:  FINDINGS: There is no significant change in the 1.0 x 1.0 cm T1 and T2 isointense pineal region cystic lesion with mild peripheral enhancement.There is no midline shift . There is no evidence of intracranial hemorrhage or acute infarct.There are no extra-axial fluid collections present.No diffusion weighted signal abnormality is identified.   Review of Systems: Patient complains of symptoms per HPI as well as the following symptoms: Memory loss, confusion, headache, slurred speech, dizziness, passing out, snoring. Pertinent negatives and positives per HPI. All others negative.   Social History   Social History  . Marital status: Married    Spouse name: N/A  . Number of children: N/A  . Years of education: N/A   Occupational History  . Not on file.   Social History Main Topics  . Smoking status: Never Smoker  . Smokeless tobacco: Never Used  . Alcohol use No  . Drug use: No  . Sexual activity: Yes   Other Topics Concern  . Not on file   Social History  Narrative  . No narrative on file    Family History  Problem Relation Age of  Onset  . Hypertension Mother   . Cancer - Other Father        liver  . Diabetes Mellitus II Maternal Grandmother   . Heart disease Maternal Grandmother   . Breast cancer Maternal Grandmother   . Cancer - Lung Maternal Grandfather   . Osteoporosis Paternal Grandmother   . Melanoma Paternal Grandfather     Past Medical History:  Diagnosis Date  . Abnormal Pap smear of cervix 12/2014  . Arthritis   . Depression   . Endometriosis   . Fibroids    age 17  . History of kidney stones   . Migraines   . Pleurisy 02/2015   while hospitalized   . Skin cancer (melanoma) ([place])   . Syncope and collapse   . Uterine cancer [facility])     Past Surgical History:  Procedure Laterality Date  . ABLATION     uterine  . COLECTOMY    . CYSTOSCOPY W/ URETERAL STENT PLACEMENT Left 02/02/2015   Procedure: CYSTOSCOPY WITH RETROGRADE PYELOGRAM/URETERAL STENT PLACEMENT;  Surgeon: Festus Aloe, MD;  Location: [facility];  Service: Urology;  Laterality: Left;  . CYSTOSCOPY WITH URETEROSCOPY AND STENT PLACEMENT Left 02/13/2015   Procedure: LEFT URETEROSCOPY WITH HOLMIUM LASER AND STENT PLACEMENT;  Surgeon: Festus Aloe, MD;  [facility]: [facility];  Service: Urology;  Laterality: Left;  . DILATION AND CURETTAGE OF UTERUS    . HOLMIUM LASER APPLICATION N/A 9/51/8841   Procedure: HOLMIUM LASER APPLICATION;  Surgeon: Festus Aloe, MD;  Location: [facility];  Service: Urology;  Laterality: N/A;  . LAPAROSCOPY     x 4  . MELANOMA EXCISION  2000   melanoma removed from face, basal cell removed from nose  . REPAIR VAGINAL CUFF N/A 08/21/2015   Procedure: REPAIR VAGINAL CUFF, EXAM UNDER ANESTHESIA;  Surgeon: Everitt Amber, MD;  Location: [facility];  Service: Gynecology;  Laterality: N/A;  . ROBOTIC ASSISTED TOTAL HYSTERECTOMY WITH BILATERAL SALPINGO OOPHERECTOMY Bilateral 05/21/2015   Procedure: XI ROBOTIC ASSISTED TOTAL HYSTERECTOMY WITH BILATERAL SALPINGO OOPHORECTOMY;  Surgeon: Everitt Amber, MD;  Location: [facility];  Service:  Gynecology;  Laterality: Bilateral;    Current Outpatient Prescriptions  Medication Sig Dispense Refill  . acetaminophen (TYLENOL) 500 MG tablet Take 1,000 mg by mouth every 4 (four) hours as needed for pain.    Marland Kitchen buPROPion (WELLBUTRIN XL) 150 MG 24 hr tablet Take 150 mg by mouth every morning.  1  . citalopram (CELEXA) 20 MG tablet Take 20 mg by mouth at bedtime.    . clonazePAM (KLONOPIN) 1 MG tablet Take 1 mg by mouth 3 (three) times daily.  0  . cyclobenzaprine (FLEXERIL) 10 MG tablet Take 10 mg by mouth 2 (two) times daily.   0  . diclofenac (VOLTAREN) 75 MG EC tablet Take 75 mg by mouth 2 (two) times daily.    . diphenhydrAMINE (BENADRYL) 25 MG tablet Take 25 mg by mouth every 6 (six) hours as needed for allergies.    . diphenoxylate-atropine (LOMOTIL) 2.5-0.025 MG tablet Take 4 tablets by mouth every 12 (twelve) hours as needed for diarrhea or loose stools.   3  . docusate sodium (COLACE) 100 MG capsule Take 300 mg by mouth daily.     Marland Kitchen estrogens, conjugated, (PREMARIN) 0.625 MG tablet Take 1 tablet (0.625 mg total) by mouth daily. Take daily for 21 days then do not take for 7 days. (  Patient taking differently: Take 0.9 mg by mouth daily. Take daily for 21 days then do not take for 7 days.) 30 tablet 6  . ibuprofen (ADVIL,MOTRIN) 800 MG tablet TAKE ONE TABLET BY MOUTH EVERY 8 HOURS AS NEEDED FOR MILD PAIN 30 tablet 0  . LamoTRIgine (LAMICTAL XR) 25 MG TB24 tablet TAKE 1 TABLET EVERY DAY FOR 14 DAYS, THEN TAKE 2 TABLETS EVERY DAY FOR 14 DAYS  0  . LamoTRIgine 50 MG TB24 Take 200 mg by mouth every evening.  1  . meloxicam (MOBIC) 7.5 MG tablet Take 7.5 mg by mouth 2 (two) times daily.    . Multiple Vitamin (MULTIVITAMIN WITH MINERALS) TABS tablet Take 1 tablet by mouth daily. Occuvite eye + multi    . Multiple Vitamins-Minerals (VITEYES AREDS FORMULA/LUTEIN) CAPS Take 1 capsule by mouth 2 (two) times daily.    . naproxen (NAPROSYN) 500 MG tablet Take 1,000 mg by mouth 2 (two) times daily.   1  . nystatin (MYCOSTATIN) 100000 UNIT/ML suspension Take 5 mLs by mouth 4 (four) times daily.  11  . omeprazole (PRILOSEC) 20 MG capsule Take 20 mg by mouth daily.    . ondansetron (ZOFRAN) 4 MG tablet Take 1 tablet (4 mg total) by mouth every 6 (six) hours as needed for nausea. 20 tablet 0  . PREMARIN 0.9 MG tablet Take 0.9 mg by mouth daily.  0  . promethazine (PHENERGAN) 12.5 MG tablet Take 12.5 mg by mouth 2 (two) times daily.    . sucralfate (CARAFATE) 1 G tablet Take 1 tablet (1 g total) by mouth 4 (four) times daily. 30 tablet 0  . tiZANidine (ZANAFLEX) 4 MG tablet Take 12 mg by mouth at bedtime.    . traZODone (DESYREL) 100 MG tablet Take 100 mg by mouth at bedtime.   4   No current facility-administered medications for this visit.     Allergies as of 04/25/2017 - Review Complete 04/25/2017  Allergen Reaction Noted  . Penicillins Swelling and Rash 02/06/2012  . Ciprofloxacin Diarrhea and Nausea Only 02/13/2015    Vitals: LMP 05/04/2015 (Approximate)  Last Weight:  Wt Readings from Last 1 Encounters:  04/21/17 240 lb (108.9 kg)   Last Height:   Ht Readings from Last 1 Encounters:  04/21/17 6' (1.829 m)     Physical exam: Exam: Gen: NAD, conversant, well nourised, obese, well groomed                     CV: RRR, no MRG. No Carotid Bruits. No peripheral edema, warm, nontender Eyes: Conjunctivae clear without exudates or hemorrhage  Neuro: Detailed Neurologic Exam  Speech:    Speech is normal; fluent and spontaneous with normal comprehension.  Cognition:    The patient is oriented to person, place, and time;     recent and remote memory intact;     language fluent;     normal attention, concentration,     fund of knowledge Cranial Nerves:    The pupils are equal, round, and reactive to light. The fundi are normal and spontaneous venous pulsations are present. Visual fields are full to finger confrontation. Extraocular movements are intact. Trigeminal sensation is  intact and the muscles of mastication are normal. The face is symmetric. The palate elevates in the midline. Hearing intact. Voice is normal. Shoulder shrug is normal. The tongue has normal motion without fasciculations.   Coordination:    Normal finger to nose and heel to shin. Normal rapid alternating movements.  Gait:    Heel-toe and tandem gait are normal.   Motor Observation:    No asymmetry, no atrophy, and no involuntary movements noted. Tone:    Normal muscle tone.    Posture:    Posture is normal. normal erect    Strength:    Strength is V/V in the upper and lower limbs.      Sensation: intact to LT     Reflex Exam:  DTR's:    Deep tendon reflexes in the upper and lower extremities are normal bilaterally.   Toes:    The toes are downgoing bilaterally.   Clonus:    Clonus is absent.     Assessment/Plan:  41 year old with chronic migraines and failed multiple medications.  - Botox for migriane - Consider Aimovig - Integrative therapies for PT, massage, acupuncture - Patient was already evaluated for OSA and nocturnal hypoxia and she needs to follow with pulmonary   Discussed: To prevent or relieve headaches, try the following: Cool Compress. Lie down and place a cool compress on your head.  Avoid headache triggers. If certain foods or odors seem to have triggered your migraines in the past, avoid them. A headache diary might help you identify triggers.  Include physical activity in your daily routine. Try a daily walk or other moderate aerobic exercise.  Manage stress. Find healthy ways to cope with the stressors, such as delegating tasks on your to-do list.  Practice relaxation techniques. Try deep breathing, yoga, massage and visualization.  Eat regularly. Eating regularly scheduled meals and maintaining a healthy diet might help prevent headaches. Also, drink plenty of fluids.  Follow a regular sleep schedule. Sleep deprivation might contribute to  headaches Consider biofeedback. With this mind-body technique, you learn to control certain bodily functions - such as muscle tension, heart rate and blood pressure - to prevent headaches or reduce headache pain.    Proceed to emergency room if you experience new or worsening symptoms or symptoms do not resolve, if you have new neurologic symptoms or if headache is severe, or for any concerning symptom.   Provided education and documentation from American headache Society toolbox including articles on: chronic migraine medication overuse headache, chronic migraines, prevention of migraines, behavioral and other nonpharmacologic treatments for headache.  Cc: Lennie Odor, PA-C  Sarina Ill, MD  [facility] [address] [place], [place] [address]  Phone [phone] Fax [phone]

## 2017-04-25 NOTE — Progress Notes (Signed)
Toradol 60 mg/2 ml administered IM to L deltoid using aseptic technique. Pt tolerated well.Bandaid applied.

## 2017-04-25 NOTE — Patient Instructions (Addendum)
Remember to drink plenty of fluid, eat healthy meals and do not skip any meals. Try to eat protein with a every meal and eat a healthy snack such as fruit or nuts in between meals. Try to keep a regular sleep-wake schedule and try to exercise daily, particularly in the form of walking, 20-30 minutes a day, if you can.   As far as your medications are concerned, I would like to suggest:  Botox for migraines [facility] Consider nerve blocks Sphenocath (Sphenopalatine ganglion block)  I would like to see you back for botox for migraines, sooner if we need to. Please call us with any interim questions, concerns, problems, updates or refill requests.   Our phone number is [phone]. We also have an after hours call service for urgent matters and there is a physician on-call for urgent questions. For any emergencies you know to call 911 or go to the nearest emergency room

## 2017-04-26 ENCOUNTER — Telehealth: Payer: Self-pay | Admitting: Neurology

## 2017-04-26 MED ORDER — SUMATRIPTAN SUCCINATE 100 MG PO TABS: 100.0000 mg | ORAL_TABLET | Freq: Once | ORAL | 2 refills | Status: DC | PRN

## 2017-04-26 NOTE — Addendum Note (Signed)
Addended by: Monte Fantasia on: 04/26/2017 02:28 PM   Modules accepted: Orders

## 2017-04-26 NOTE — Telephone Encounter (Signed)
Patient called office in reference to ongoing migraine.  Patient did a cocktail at home last night with no relief and vomitting.  Patient would like to know what else she can do.  Pain level is 5 1/2.  Pharmacy- [place] Please call

## 2017-04-26 NOTE — Telephone Encounter (Signed)
Denise Avila, I reviewed patient's sleep study results and doesn;t appear as a cpap or treatment is necessary, she does not have sleep apnea. Looks like she was evaluated by pulmonary for sleep related hypoxia but I am unclear if they recommended any treatment so she should just follow up on pulmonary recommendations but she does not need to see our sleep team. Would you please let her know? Thanks!

## 2017-04-26 NOTE — Telephone Encounter (Signed)
Discussed w/ Dr. Jaynee Eagles and called pt who says that Imitrex worked well for her when she was younger. Rx e-scribed to pt's pharmacy. She agreed to call back tomorrow if not feeling better. Voiced appreciation for call.

## 2017-04-26 NOTE — Telephone Encounter (Signed)
thanks

## 2017-04-27 ENCOUNTER — Telehealth: Payer: Self-pay | Admitting: *Deleted

## 2017-04-27 ENCOUNTER — Telehealth: Payer: Self-pay | Admitting: Neurology

## 2017-04-27 NOTE — Telephone Encounter (Signed)
Patient called office in reference to still having an ongoing migraine patient picked up Imitrex and it hasn't even touched her migraine.  Patient would like to know if there is anything else at all she can do.  Please call

## 2017-04-27 NOTE — Telephone Encounter (Signed)
Faith, can you se eif she can come in for a migraine cocktail? Talk to the infusion center then call patient? thanks

## 2017-04-27 NOTE — Telephone Encounter (Signed)
Referral sent [facility] referral sent .  Via Fax [phone].

## 2017-04-27 NOTE — Telephone Encounter (Signed)
I have spoken with Otila Kluver in the infusion suite, also have spoken with Denise Avila.  She will come in at 1300 today for IV/fim

## 2017-04-28 DIAGNOSIS — M79641 Pain in right hand: Secondary | ICD-10-CM | POA: Diagnosis not present

## 2017-04-28 DIAGNOSIS — G894 Chronic pain syndrome: Secondary | ICD-10-CM | POA: Diagnosis not present

## 2017-04-28 DIAGNOSIS — M2550 Pain in unspecified joint: Secondary | ICD-10-CM | POA: Diagnosis not present

## 2017-04-28 DIAGNOSIS — R5382 Chronic fatigue, unspecified: Secondary | ICD-10-CM | POA: Diagnosis not present

## 2017-04-28 DIAGNOSIS — M79642 Pain in left hand: Secondary | ICD-10-CM | POA: Diagnosis not present

## 2017-04-28 NOTE — Telephone Encounter (Signed)
Called pt w/ results/recommendations per MD. May call back w/ any questions.

## 2017-05-01 ENCOUNTER — Telehealth: Payer: Self-pay | Admitting: Neurology

## 2017-05-01 NOTE — Telephone Encounter (Signed)
Referral has been sent [phone].

## 2017-05-01 NOTE — Telephone Encounter (Signed)
Called pt who agreed to check in at 4 pm tomorrow for nerve blocks. Can call her back if cancellations or if anything opens up earlier tomorrow afternoon.

## 2017-05-01 NOTE — Telephone Encounter (Signed)
We can try nerve blocks, can we get her into my schedule? thanks

## 2017-05-01 NOTE — Telephone Encounter (Signed)
Patient is scheduled for Botox Apt 05/11/2017. Patient is aware.

## 2017-05-01 NOTE — Telephone Encounter (Signed)
Patient wants to discuss possibly having a nerve block for migraines.

## 2017-05-01 NOTE — Telephone Encounter (Signed)
Pt calling because although she just had the infusion pt states she is still having terrible migraines, she is asking to be called please

## 2017-05-01 NOTE — Telephone Encounter (Signed)
I can see her tomorrow, can you schedule?

## 2017-05-01 NOTE — Telephone Encounter (Signed)
New migraine pt seen for 1st visit on 04/25/17. MRI from April 2018 was reviewed w/ no significant change. She was given Toradol injection, referred to [facility] and is scheduled for Botox 05/11/17. Her sleep study results were negative for sleep apnea. She called back the next day on 04/26/17 and an Imitrex rx was sent in. She returned to the office on 04/27/17 for a migraine infusion.   She has tried and failed or has contraindications to propranolol, topiramate, zonegran, depakote and tizanidine. She is already on lamictal, wellbutrin, celexa, trazodone, naprosyn and phenergan.

## 2017-05-02 ENCOUNTER — Ambulatory Visit (INDEPENDENT_AMBULATORY_CARE_PROVIDER_SITE_OTHER): Payer: BLUE CROSS/BLUE SHIELD | Admitting: Neurology

## 2017-05-02 VITALS — BP 104/70 | HR 83

## 2017-05-02 DIAGNOSIS — G43011 Migraine without aura, intractable, with status migrainosus: Secondary | ICD-10-CM

## 2017-05-02 DIAGNOSIS — M542 Cervicalgia: Secondary | ICD-10-CM | POA: Diagnosis not present

## 2017-05-02 DIAGNOSIS — G43909 Migraine, unspecified, not intractable, without status migrainosus: Secondary | ICD-10-CM | POA: Diagnosis not present

## 2017-05-02 NOTE — Progress Notes (Signed)
1615 DHE 1 mg/15ml - administered 1/2 ml IM to L deltoid using aseptic technique. Pt tolerated well.Bandaid applied. 1640 Second dose of 1/2 ml administered to R deltoid using aseptic technique. Pt tolerated well.Bandaid applied.  1710 Pt resting quietly in recliner w/i infusion suite. Reports no improvement in pain.  Nerve block:  Xylocaine 2% (20 mg/ml) NDC 56861-683-72 Lot [id number] Exp 11/20  Bupivacaine 0.5% (5mg /ml) NDC 52080-223-36 BATCH: [id number] Exp: 12/2018  3 ml syringes of 1.5 ml marcaine and 1.5 ml lidocaine

## 2017-05-02 NOTE — Telephone Encounter (Signed)
Spoke to pt yesterday evening and she's agreed to come in this afternoon for nerve blocks.

## 2017-05-03 ENCOUNTER — Telehealth: Payer: Self-pay | Admitting: Neurology

## 2017-05-03 ENCOUNTER — Emergency Department (HOSPITAL_COMMUNITY)
Admission: EM | Admit: 2017-05-03 | Discharge: 2017-05-03 | Disposition: A | Discharge status: Home / Self Care | Payer: BLUE CROSS/BLUE SHIELD | Attending: Emergency Medicine | Admitting: Emergency Medicine

## 2017-05-03 ENCOUNTER — Emergency Department (HOSPITAL_COMMUNITY): Payer: BLUE CROSS/BLUE SHIELD

## 2017-05-03 ENCOUNTER — Encounter (HOSPITAL_COMMUNITY): Payer: Self-pay | Admitting: Emergency Medicine

## 2017-05-03 DIAGNOSIS — W01190A Fall on same level from slipping, tripping and stumbling with subsequent striking against furniture, initial encounter: Secondary | ICD-10-CM | POA: Diagnosis not present

## 2017-05-03 DIAGNOSIS — Y92002 Bathroom of unspecified non-institutional (private) residence single-family (private) house as the place of occurrence of the external cause: Secondary | ICD-10-CM | POA: Diagnosis not present

## 2017-05-03 DIAGNOSIS — S0990XA Unspecified injury of head, initial encounter: Secondary | ICD-10-CM | POA: Diagnosis not present

## 2017-05-03 DIAGNOSIS — Y69 Unspecified misadventure during surgical and medical care: Secondary | ICD-10-CM | POA: Diagnosis not present

## 2017-05-03 DIAGNOSIS — Z79899 Other long term (current) drug therapy: Secondary | ICD-10-CM | POA: Diagnosis not present

## 2017-05-03 DIAGNOSIS — T148XXA Other injury of unspecified body region, initial encounter: Secondary | ICD-10-CM | POA: Diagnosis not present

## 2017-05-03 DIAGNOSIS — IMO0002 Reserved for concepts with insufficient information to code with codable children: Secondary | ICD-10-CM

## 2017-05-03 DIAGNOSIS — I951 Orthostatic hypotension: Secondary | ICD-10-CM | POA: Diagnosis not present

## 2017-05-03 DIAGNOSIS — R51 Headache: Secondary | ICD-10-CM | POA: Diagnosis not present

## 2017-05-03 DIAGNOSIS — R55 Syncope and collapse: Secondary | ICD-10-CM | POA: Diagnosis not present

## 2017-05-03 DIAGNOSIS — S4992XA Unspecified injury of left shoulder and upper arm, initial encounter: Secondary | ICD-10-CM | POA: Diagnosis not present

## 2017-05-03 DIAGNOSIS — Y999 Unspecified external cause status: Secondary | ICD-10-CM | POA: Insufficient documentation

## 2017-05-03 DIAGNOSIS — M25552 Pain in left hip: Secondary | ICD-10-CM | POA: Diagnosis not present

## 2017-05-03 DIAGNOSIS — S79912A Unspecified injury of left hip, initial encounter: Secondary | ICD-10-CM | POA: Diagnosis not present

## 2017-05-03 DIAGNOSIS — S299XXA Unspecified injury of thorax, initial encounter: Secondary | ICD-10-CM | POA: Diagnosis not present

## 2017-05-03 DIAGNOSIS — M25512 Pain in left shoulder: Secondary | ICD-10-CM | POA: Diagnosis not present

## 2017-05-03 DIAGNOSIS — Y93E1 Activity, personal bathing and showering: Secondary | ICD-10-CM | POA: Insufficient documentation

## 2017-05-03 DIAGNOSIS — T887XXA Unspecified adverse effect of drug or medicament, initial encounter: Secondary | ICD-10-CM | POA: Insufficient documentation

## 2017-05-03 DIAGNOSIS — G43709 Chronic migraine without aura, not intractable, without status migrainosus: Secondary | ICD-10-CM | POA: Diagnosis not present

## 2017-05-03 DIAGNOSIS — S199XXA Unspecified injury of neck, initial encounter: Secondary | ICD-10-CM | POA: Diagnosis not present

## 2017-05-03 DIAGNOSIS — T50905A Adverse effect of unspecified drugs, medicaments and biological substances, initial encounter: Secondary | ICD-10-CM | POA: Diagnosis not present

## 2017-05-03 DIAGNOSIS — S59902A Unspecified injury of left elbow, initial encounter: Secondary | ICD-10-CM | POA: Diagnosis not present

## 2017-05-03 DIAGNOSIS — W19XXXA Unspecified fall, initial encounter: Secondary | ICD-10-CM

## 2017-05-03 LAB — BASIC METABOLIC PANEL
Anion gap: 9 (ref 5–15)
BUN: 22 mg/dL — ABNORMAL HIGH (ref 6–20)
CHLORIDE: 105 mmol/L (ref 101–111)
CO2: 25 mmol/L (ref 22–32)
CREATININE: 0.7 mg/dL (ref 0.44–1.00)
Calcium: 9 mg/dL (ref 8.9–10.3)
Glucose, Bld: 125 mg/dL — ABNORMAL HIGH (ref 65–99)
POTASSIUM: 3.8 mmol/L (ref 3.5–5.1)
SODIUM: 139 mmol/L (ref 135–145)

## 2017-05-03 LAB — I-STAT TROPONIN, ED: Troponin i, poc: 0 ng/mL (ref 0.00–0.08)

## 2017-05-03 LAB — CBC
HCT: 35 % — ABNORMAL LOW (ref 36.0–46.0)
Hemoglobin: 11.1 g/dL — ABNORMAL LOW (ref 12.0–15.0)
MCH: 26.6 pg (ref 26.0–34.0)
MCHC: 31.7 g/dL (ref 30.0–36.0)
MCV: 83.9 fL (ref 78.0–100.0)
PLATELETS: 199 10*3/uL (ref 150–400)
RBC: 4.17 MIL/uL (ref 3.87–5.11)
RDW: 14 % (ref 11.5–15.5)
WBC: 9 10*3/uL (ref 4.0–10.5)

## 2017-05-03 LAB — CBG MONITORING, ED: Glucose-Capillary: 136 mg/dL — ABNORMAL HIGH (ref 65–99)

## 2017-05-03 MED ORDER — NAPROXEN 375 MG PO TABS: 375.0000 mg | ORAL_TABLET | Freq: Two times a day (BID) | ORAL | 0 refills | Status: DC

## 2017-05-03 MED ORDER — DIPHENHYDRAMINE HCL 50 MG/ML IJ SOLN
12.5000 mg | Freq: Once | INTRAMUSCULAR | Status: AC
  Administered 2017-05-03: 12.5 mg via INTRAVENOUS
  Filled 2017-05-03: qty 1

## 2017-05-03 MED ORDER — KETOROLAC TROMETHAMINE 30 MG/ML IJ SOLN
30.0000 mg | Freq: Once | INTRAMUSCULAR | Status: AC
  Administered 2017-05-03: 30 mg via INTRAVENOUS
  Filled 2017-05-03: qty 1

## 2017-05-03 MED ORDER — SODIUM CHLORIDE 0.9 % IV BOLUS (SEPSIS)
1000.0000 mL | Freq: Once | INTRAVENOUS | Status: AC
  Administered 2017-05-03: 1000 mL via INTRAVENOUS

## 2017-05-03 MED ORDER — METOCLOPRAMIDE HCL 5 MG/ML IJ SOLN
10.0000 mg | Freq: Once | INTRAMUSCULAR | Status: AC
  Administered 2017-05-03: 10 mg via INTRAVENOUS
  Filled 2017-05-03: qty 2

## 2017-05-03 MED ORDER — SODIUM CHLORIDE 0.9 % IV BOLUS (SEPSIS)
500.0000 mL | Freq: Once | INTRAVENOUS | Status: AC
  Administered 2017-05-03: 500 mL via INTRAVENOUS

## 2017-05-03 MED ORDER — SODIUM CHLORIDE 0.9 % IV BOLUS (SEPSIS): 500.0000 mL | Freq: Once | INTRAVENOUS | Status: DC

## 2017-05-03 MED ORDER — CYCLOBENZAPRINE HCL 10 MG PO TABS
10.0000 mg | ORAL_TABLET | Freq: Two times a day (BID) | ORAL | 0 refills | Status: DC
Start: 2017-05-03 — End: 2017-06-07

## 2017-05-03 NOTE — ED Notes (Signed)
Patient given ginger ale per provider.

## 2017-05-03 NOTE — ED Triage Notes (Signed)
Patient arrives by EMS with complaints of a syncopal episode at 2330 with subsequent fall. Patient complaining of headache, left shoulder,hip and elbow pain. Patient has brain cancer. EMS administered Zofran 4  Mg ODT for nausea. BP 101/69, HR 95, NSR, O2 sat 95% RA, CBG 144.

## 2017-05-03 NOTE — ED Provider Notes (Addendum)
[name] DEPT Provider Note   CSN: [id number] Arrival date & time: 05/03/17  0102  By signing my name below, I, Reola Mosher, attest that this documentation has been prepared under the direction and in the presence of Levar Fayson, MD. Electronically Signed: Reola Mosher, ED Scribe. 05/03/17. 1:52 AM.  History   Chief Complaint Chief Complaint  Patient presents with  . Loss of Consciousness  . Fall  . Headache  . Left elbow pain  . Left hip pain  . Left shoulder pain   The history is provided by the patient. No language interpreter was used.  Loss of Consciousness   This is a new problem. The current episode started less than 1 hour ago. The problem occurs rarely. The problem has been resolved. She lost consciousness for a period of less than one minute. The problem is associated with normal activity. Associated symptoms include headaches and nausea. Pertinent negatives include chest pain and vomiting. She has tried medications for the symptoms. The treatment provided no relief.    HPI Comments: Denise Avila is a 41 y.o. female with a h/o migraines, who presents to the Emergency Department following a syncopal episode which occurred prior to arrival. Pt reports that she was getting out of the shower when she sustained an episode of syncope, causing her to fall and strike her head on the toilet below her. She also notes that she struck her left shoulder, elbow and hip on the floor below her and she currently has moderate pain to all sites. Pt additionally notes that she has had a chronic migraine over the past four weeks; this has acutely worsened since she struck her head tonight. Pt reports associated nausea as well. She is followed by Neurology for her ongoing migraines; she tolerates migraine cocktails throughout the week with her last being yesterday. Pt was given Zofran en route without noted improvement of her nausea. She denies vomiting, or any other  associated symptoms.   Past Medical History:  Diagnosis Date  . Abnormal Pap smear of cervix 12/2014  . Arthritis   . Depression   . Endometriosis   . Fibroids    age 54  . History of kidney stones   . Migraines   . Pleurisy 02/2015   while hospitalized   . Skin cancer (melanoma) ([place])   . Syncope and collapse   . Uterine cancer [facility])    Patient Active Problem List   Diagnosis Date Noted  . Intractable chronic migraine without aura with status migrainosus 04/25/2017  . Vaginal cuff dehiscence 08/21/2015  . Vaginal bleeding   . Surgical menopause on hormone replacement therapy 06/22/2015  . Endometriosis of ovary 05/21/2015  . Abnormal uterine bleeding   . Endometriosis   . Pyelonephritis 02/02/2015  . Nephrolithiasis 02/02/2015  . Hydronephrosis 02/02/2015  . Left ureteral stone    Past Surgical History:  Procedure Laterality Date  . ABLATION     uterine  . COLECTOMY    . CYSTOSCOPY W/ URETERAL STENT PLACEMENT Left 02/02/2015   Procedure: CYSTOSCOPY WITH RETROGRADE PYELOGRAM/URETERAL STENT PLACEMENT;  Surgeon: Festus Aloe, MD;  Location: [facility];  Service: Urology;  Laterality: Left;  . CYSTOSCOPY WITH URETEROSCOPY AND STENT PLACEMENT Left 02/13/2015   Procedure: LEFT URETEROSCOPY WITH HOLMIUM LASER AND STENT PLACEMENT;  Surgeon: Festus Aloe, MD;  Location: [facility];  Service: Urology;  Laterality: Left;  . DILATION AND CURETTAGE OF UTERUS    . HOLMIUM LASER APPLICATION N/A 8/85/0277   Procedure: HOLMIUM LASER  APPLICATION;  Surgeon: Festus Aloe, MD;  Location: [facility];  Service: Urology;  Laterality: N/A;  . LAPAROSCOPY     x 4  . MELANOMA EXCISION  2000   melanoma removed from face, basal cell removed from nose  . REPAIR VAGINAL CUFF N/A 08/21/2015   Procedure: REPAIR VAGINAL CUFF, EXAM UNDER ANESTHESIA;  Surgeon: Everitt Amber, MD;  Location: [facility];  Service: Gynecology;  Laterality: N/A;  . ROBOTIC ASSISTED TOTAL HYSTERECTOMY WITH BILATERAL SALPINGO  OOPHERECTOMY Bilateral 05/21/2015   Procedure: XI ROBOTIC ASSISTED TOTAL HYSTERECTOMY WITH BILATERAL SALPINGO OOPHORECTOMY;  Surgeon: Everitt Amber, MD;  Location: [facility];  Service: Gynecology;  Laterality: Bilateral;   OB History    No data available     Home Medications    Prior to Admission medications   Medication Sig Start Date End Date Taking? Authorizing Provider  acetaminophen (TYLENOL) 500 MG tablet Take 1,000 mg by mouth every 4 (four) hours as needed for pain.    [provider]  buPROPion (WELLBUTRIN XL) 150 MG 24 hr tablet Take 150 mg by mouth every morning. 02/18/17   [provider]  citalopram (CELEXA) 20 MG tablet Take 20 mg by mouth at bedtime.    [provider]  clonazePAM (KLONOPIN) 1 MG tablet Take 1 mg by mouth 3 (three) times daily. 04/03/17   [provider]  cyclobenzaprine (FLEXERIL) 10 MG tablet Take 10 mg by mouth 2 (two) times daily.  06/18/15   [provider]  diclofenac (VOLTAREN) 75 MG EC tablet Take 75 mg by mouth 2 (two) times daily.    [provider]  diphenhydrAMINE (BENADRYL) 25 MG tablet Take 25 mg by mouth every 6 (six) hours as needed for allergies. 07/27/16   [provider]  diphenoxylate-atropine (LOMOTIL) 2.5-0.025 MG tablet Take 4 tablets by mouth every 12 (twelve) hours as needed for diarrhea or loose stools.  04/10/17   [provider]  docusate sodium (COLACE) 100 MG capsule Take 300 mg by mouth daily.     [provider]  estrogens, conjugated, (PREMARIN) 0.625 MG tablet Take 1 tablet (0.625 mg total) by mouth daily. Take daily for 21 days then do not take for 7 days. Patient taking differently: Take 0.9 mg by mouth daily. Take daily for 21 days then do not take for 7 days. 06/22/15   Everitt Amber, MD  ibuprofen (ADVIL,MOTRIN) 800 MG tablet TAKE ONE TABLET BY MOUTH EVERY 8 HOURS AS NEEDED FOR MILD PAIN 09/24/15   Joylene John D, NP  LamoTRIgine (LAMICTAL XR) 25 MG TB24  tablet TAKE 1 TABLET EVERY DAY FOR 14 DAYS, THEN TAKE 2 TABLETS EVERY DAY FOR 14 DAYS 02/06/17   [provider]  LamoTRIgine 50 MG TB24 Take 200 mg by mouth every evening. 03/02/17   [provider]  meloxicam (MOBIC) 7.5 MG tablet Take 7.5 mg by mouth 2 (two) times daily. 04/21/17 04/21/18  [provider]  Multiple Vitamin (MULTIVITAMIN WITH MINERALS) TABS tablet Take 1 tablet by mouth daily. Occuvite eye + multi    [provider]  Multiple Vitamins-Minerals (VITEYES AREDS FORMULA/LUTEIN) CAPS Take 1 capsule by mouth 2 (two) times daily.    [provider]  naproxen (NAPROSYN) 500 MG tablet Take 1,000 mg by mouth 2 (two) times daily. 04/03/17   [provider]  nystatin (MYCOSTATIN) 100000 UNIT/ML suspension Take 5 mLs by mouth 4 (four) times daily. 02/09/17   [provider]  omeprazole (PRILOSEC) 20 MG capsule Take 20  mg by mouth daily.    [provider]  ondansetron (ZOFRAN) 4 MG tablet Take 1 tablet (4 mg total) by mouth every 6 (six) hours as needed for nausea. 05/22/15   Everitt Amber, MD  PREMARIN 0.9 MG tablet Take 0.9 mg by mouth daily. 01/22/17   [provider]  promethazine (PHENERGAN) 12.5 MG tablet Take 12.5 mg by mouth 2 (two) times daily.    [provider]  sucralfate (CARAFATE) 1 G tablet Take 1 tablet (1 g total) by mouth 4 (four) times daily. 02/24/15   Lacretia Leigh, MD  SUMAtriptan (IMITREX) 100 MG tablet Take 1 tablet (100 mg total) by mouth once as needed for migraine. May repeat in 2 hours if headache persists or recurs. Max: 2 tabs/day 04/26/17   Melvenia Beam, MD  tiZANidine (ZANAFLEX) 4 MG tablet Take 12 mg by mouth at bedtime. 03/01/17   [provider]  traZODone (DESYREL) 100 MG tablet Take 100 mg by mouth at bedtime.  09/07/15   [provider]   Family History Family History  Problem Relation Age of Onset  . Hypertension Mother   . Cancer - Other Father         liver  . Diabetes Mellitus II Maternal Grandmother   . Heart disease Maternal Grandmother   . Breast cancer Maternal Grandmother   . Cancer - Lung Maternal Grandfather   . Osteoporosis Paternal Grandmother   . Melanoma Paternal Grandfather    Social History Social History  Substance Use Topics  . Smoking status: Never Smoker  . Smokeless tobacco: Never Used  . Alcohol use No   Allergies   Penicillins and Ciprofloxacin  Review of Systems Review of Systems  Constitutional: Negative for appetite change and chills.  HENT: Negative for drooling and facial swelling.   Eyes: Negative for photophobia.  Cardiovascular: Positive for syncope. Negative for chest pain and leg swelling.  Gastrointestinal: Positive for nausea. Negative for anal bleeding and vomiting.  Genitourinary: Negative for difficulty urinating.  Musculoskeletal: Negative for neck stiffness.  Skin: Negative for pallor.  Neurological: Positive for syncope and headaches. Negative for facial asymmetry and speech difficulty.  Psychiatric/Behavioral: Negative for suicidal ideas.  All other systems reviewed and are negative.  Physical Exam Updated Vital Signs BP 119/67 (BP Location: Right Arm)   Pulse 95   Temp 98.2 F (36.8 C) (Oral)   Resp 14   LMP 05/04/2015 (Approximate)   SpO2 96%   Physical Exam  Constitutional: She appears well-developed and well-nourished.  HENT:  Head: Normocephalic.  Mouth/Throat: Oropharynx is clear and moist. No oropharyngeal exudate.  Eyes: Pupils are equal, round, and reactive to light. Conjunctivae and EOM are normal. Right eye exhibits no discharge. Left eye exhibits no discharge. No scleral icterus.  Neck: Normal range of motion. Neck supple. No JVD present. No tracheal deviation present.  Trachea is midline. No stridor or carotid bruits.   Cardiovascular: Normal rate, regular rhythm, normal heart sounds and intact distal pulses.   No murmur heard. Pulmonary/Chest: Effort normal  and breath sounds normal. No stridor. No respiratory distress. She has no wheezes. She has no rales.  Lungs CTA bilaterally.  Abdominal: Soft. Bowel sounds are normal. She exhibits no distension. There is no tenderness. There is no rebound and no guarding.  Musculoskeletal: Normal range of motion. She exhibits no edema or tenderness.  All compartments are soft. No palpable cords. Hip is well seated. Intact distal pulses. Clavicle is intact. No effusion of the shoulder,  appears to be in the joint.   Lymphadenopathy:    She has no cervical adenopathy.  Neurological: She is alert. She has normal reflexes. She displays normal reflexes. She exhibits normal muscle tone.  Skin: Skin is warm and dry. Capillary refill takes less than 2 seconds.  Psychiatric: She has a normal mood and affect. Her behavior is normal.  Nursing note and vitals reviewed.  ED Treatments / Results  DIAGNOSTIC STUDIES: Oxygen Saturation is 96% on RA, normal by my interpretation.   COORDINATION OF CARE: 1:50 AM-Discussed next steps with pt. Pt verbalized understanding and is agreeable with the plan.   Labs (all labs ordered are listed, but only abnormal results are displayed)  Results for orders placed or performed during the hospital encounter of 16/10/96  Basic metabolic panel  Result Value Ref Range   Sodium 139 135 - 145 mmol/L   Potassium 3.8 3.5 - 5.1 mmol/L   Chloride 105 101 - 111 mmol/L   CO2 25 22 - 32 mmol/L   Glucose, Bld 125 (H) 65 - 99 mg/dL   BUN 22 (H) 6 - 20 mg/dL   Creatinine, Ser 0.70 0.44 - 1.00 mg/dL   Calcium 9.0 8.9 - 10.3 mg/dL   GFR calc non Af Amer >60 >60 mL/min   GFR calc Af Amer >60 >60 mL/min   Anion gap 9 5 - 15  CBC  Result Value Ref Range   WBC 9.0 4.0 - 10.5 K/uL   RBC 4.17 3.87 - 5.11 MIL/uL   Hemoglobin 11.1 (L) 12.0 - 15.0 g/dL   HCT 35.0 (L) 36.0 - 46.0 %   MCV 83.9 78.0 - 100.0 fL   MCH 26.6 26.0 - 34.0 pg   MCHC 31.7 30.0 - 36.0 g/dL   RDW 14.0 11.5 - 15.5 %    Platelets 199 150 - 400 K/uL  CBG monitoring, ED  Result Value Ref Range   Glucose-Capillary 136 (H) 65 - 99 mg/dL  I-stat troponin, ED  Result Value Ref Range   Troponin i, poc 0.00 0.00 - 0.08 ng/mL   Comment 3           Dg Chest 2 View  Result Date: 05/03/2017 CLINICAL DATA:  41 year old female with fall. EXAM: CHEST  2 VIEW COMPARISON:  Chest radiograph dated 04/21/2017 FINDINGS: The heart size and mediastinal contours are within normal limits. Both lungs are clear. The visualized skeletal structures are unremarkable. IMPRESSION: No active cardiopulmonary disease. Electronically Signed   By: Anner Crete M.D.   On: 05/03/2017 02:53   Dg Chest 2 View  Result Date: 04/21/2017 CLINICAL DATA:  Chest pain. EXAM: CHEST  2 VIEW COMPARISON:  Scout image for CT scan dated 02/24/2015 FINDINGS: The heart size and mediastinal contours are within normal limits. Both lungs are clear. The visualized skeletal structures are unremarkable. IMPRESSION: Normal chest. Electronically Signed   By: Lorriane Shire M.D.   On: 04/21/2017 14:36   Ct Head Wo Contrast  Result Date: 05/03/2017 CLINICAL DATA:  40 year old female with fall. EXAM: CT HEAD WITHOUT CONTRAST CT CERVICAL SPINE WITHOUT CONTRAST TECHNIQUE: Multidetector CT imaging of the head and cervical spine was performed following the standard protocol without intravenous contrast. Multiplanar CT image reconstructions of the cervical spine were also generated. COMPARISON:  CT dated 01/29/2011 FINDINGS: CT HEAD FINDINGS Brain: No evidence of acute infarction, hemorrhage, hydrocephalus, extra-axial collection or mass lesion/mass effect. Vascular: No hyperdense vessel or unexpected calcification. Skull: Normal. Negative for fracture or focal lesion. Sinuses/Orbits: No  acute finding. Other: None CT CERVICAL SPINE FINDINGS Alignment: Normal. Skull base and vertebrae: No acute fracture. No primary bone lesion or focal pathologic process. Soft tissues and spinal  canal: No prevertebral fluid or swelling. No visible canal hematoma. Disc levels:  No acute findings.  No degenerative changes. Upper chest: Negative. Other: None IMPRESSION: 1. No acute intracranial pathology. 2. No acute/traumatic cervical spine pathology. Electronically Signed   By: Anner Crete M.D.   On: 05/03/2017 03:17   Ct Cervical Spine Wo Contrast  Result Date: 05/03/2017 CLINICAL DATA:  41 year old female with fall. EXAM: CT HEAD WITHOUT CONTRAST CT CERVICAL SPINE WITHOUT CONTRAST TECHNIQUE: Multidetector CT imaging of the head and cervical spine was performed following the standard protocol without intravenous contrast. Multiplanar CT image reconstructions of the cervical spine were also generated. COMPARISON:  CT dated 01/29/2011 FINDINGS: CT HEAD FINDINGS Brain: No evidence of acute infarction, hemorrhage, hydrocephalus, extra-axial collection or mass lesion/mass effect. Vascular: No hyperdense vessel or unexpected calcification. Skull: Normal. Negative for fracture or focal lesion. Sinuses/Orbits: No acute finding. Other: None CT CERVICAL SPINE FINDINGS Alignment: Normal. Skull base and vertebrae: No acute fracture. No primary bone lesion or focal pathologic process. Soft tissues and spinal canal: No prevertebral fluid or swelling. No visible canal hematoma. Disc levels:  No acute findings.  No degenerative changes. Upper chest: Negative. Other: None IMPRESSION: 1. No acute intracranial pathology. 2. No acute/traumatic cervical spine pathology. Electronically Signed   By: Anner Crete M.D.   On: 05/03/2017 03:17   Dg Shoulder Left  Result Date: 05/03/2017 CLINICAL DATA:  41 year old female with fall and left shoulder pain. EXAM: LEFT SHOULDER - 2+ VIEW COMPARISON:  None. FINDINGS: There is no evidence of fracture or dislocation. There is no evidence of arthropathy or other focal bone abnormality. Soft tissues are unremarkable. IMPRESSION: Negative. Electronically Signed   By: Anner Crete M.D.   On: 05/03/2017 02:53   Dg Hip Unilat With Pelvis 2-3 Views Left  Result Date: 05/03/2017 CLINICAL DATA:  41 year old female with fall and left hip pain. EXAM: DG HIP (WITH OR WITHOUT PELVIS) 2-3V LEFT COMPARISON:  None. FINDINGS: There is no evidence of hip fracture or dislocation. There is no evidence of arthropathy or other focal bone abnormality. IMPRESSION: Negative. Electronically Signed   By: Anner Crete M.D.   On: 05/03/2017 02:54   EKG  EKG Interpretation  Date/Time:  Wednesday May 03 2017 01:22:12 EDT Ventricular Rate:  91 PR Interval:    QRS Duration: 104 QT Interval:  410 QTC Calculation: 505 R Axis:   3 Text Interpretation:  Sinus rhythm Nonspecific T abnormalities, anterior leads Borderline prolonged QT interval Confirmed by Randal Buba, Tyjon Bowen ([id number]) on 05/03/2017 1:37:07 AM       EKG Interpretation  Date/Time:  Wednesday May 03 2017 06:07:56 EDT Ventricular Rate:  83 PR Interval:    QRS Duration: 110 QT Interval:  418 QTC Calculation: 492 R Axis:   -8 Text Interpretation:  Sinus rhythm Confirmed by Randal Buba, Nyzaiah Kai ([id number]) on 05/03/2017 6:25:42 AM       Procedures Procedures   Medications Ordered in ED   Medications  ketorolac (TORADOL) 30 MG/ML injection 30 mg (30 mg Intravenous Given 05/03/17 0420)  sodium chloride 0.9 % bolus 500 mL (0 mLs Intravenous Stopped 05/03/17 0606)  metoCLOPramide (REGLAN) injection 10 mg (10 mg Intravenous Given 05/03/17 0419)  diphenhydrAMINE (BENADRYL) injection 12.5 mg (12.5 mg Intravenous Given 05/03/17 0423)  sodium chloride 0.9 % bolus 1,000 mL (0 mLs Intravenous  Stopped 05/03/17 0511)     Final Clinical Impressions(s) / ED Diagnoses  Syncope, likely secondary to injections given in the neurologist's office.  Hydrate well.  Follow up with your PMD and neurologist.  Strict return precautions given.  Strict return precautions given for weakness, difficulty ambulating, inability to tolerate oral medication,   Worsening of rash, swelling of the lips or face, shortness of breath,  syncope or any concerns. No signs of systemic illness or infection. The patient is nontoxic-appearing on exam and vital signs are within normal limits.   I have reviewed the triage vital signs and the nursing notes. Pertinent labs &imaging results that were available during my care of the patient were reviewed by me and considered in my medical decision making (see chart for details).  After history, exam, and medical workup I feel the patient has been appropriately medically screened and is safe for discharge home. Pertinent diagnoses were discussed with the patient. Patient was given return precautions.  I personally performed the services described in this documentation, which was scribed in my presence. The recorded information has been reviewed and is accurate.       Lucette Kratz, MD 05/03/17 [place], Sabreena Vogan, MD 05/03/17 2929

## 2017-05-03 NOTE — Telephone Encounter (Signed)
Received faxed 05/02/17 PT eval from [facility] w/ plan for weekly visits for 8-12 wks. Sent to med records for scanning, copy to Dr. Jaynee Eagles for review.

## 2017-05-03 NOTE — ED Notes (Signed)
Bed: RESA Expected date:  Expected time:  Means of arrival:  Comments: EMS cancer patient/fall

## 2017-05-03 NOTE — Addendum Note (Signed)
Addended by: Monte Fantasia on: 05/03/2017 06:20 PM   Modules accepted: Orders

## 2017-05-03 NOTE — Telephone Encounter (Signed)
Patient had nerve blocks yesterday and still has migraines. Please call she is in a lot of pain.

## 2017-05-03 NOTE — Progress Notes (Signed)
Performed by Dr. Jaynee Eagles M.D.  All procedures a documented below were medically necessary, reasonable and appropriate based on the patient's history, medical diagnosis and physician opinion. Verbal informed consent was obtained from the patient, patient was informed of potential risk of procedure, including bruising, bleeding, hematoma formation, infection, muscle weakness, muscle pain, numbness, transient hypertension, transient hyperglycemia and transient insomnia among others. All areas injected were topically clean with isopropyl rubbing alcohol. Nonsterile nonlatex gloves were worn during the procedure.  1. Greater occipital nerve block [id number]). The greater occipital nerve site was identified at the nuchal line medial to the occipital artery. Medication was injected into the left and right occipital nerve areas and suboccipital areas. Patient's condition is associated with inflammation of the greater occipital nerve and associated multiple groups. Injection was deemed medically necessary, reasonable and appropriate. Injection represents a separate and unique surgical service.  3. Auriculotemporal nerve block (12751): The Auriculotemporal nerve site was identified along the posterior margin of the sternocleidomastoid muscle toward the base of the ear. Medication was injected into the left and right radicular temporal nerve areas. Patient's condition is associated with inflammation of the Auriculotemporal Nerve and associated muscle groups. Injection was deemed medically necessary, reasonable and appropriate. Injection represents a separate and unique surgical service.  4. Supraorbital nerve block (64400): Supraorbital nerve site was identified along the incision of the frontal bone on the orbital/supraorbital ridge. Medication was injected into the left and right supraorbital nerve areas. Patient's condition is associated with inflammation of the supraorbital and associated muscle groups. Injection was  deemed medically necessary, reasonable and appropriate. Injection represents a separate and unique surgical service.

## 2017-05-03 NOTE — Telephone Encounter (Signed)
Pt called back, she will wait for call from RN or provider

## 2017-05-03 NOTE — Telephone Encounter (Signed)
Returned call to pt. She reported that she did go to the ER last night after a fall in the [place]. Continues to c/o unrelieved HA and nausea for which she has taken phenergan and naproxen today. She is out of the Imitrex but it is too soon to refill. Also says that she's out of Flexeril which she says has helped in the past, will check w/ MD on refills. Reviewed ER visit w/ pt, answered questions about NORMAL CT results of the head and neck. 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 After lengthy discussion, she agreed to call GYN to discuss hysterectomy/hormone relation to her symptoms as well as contact her PCP for elevated glucose in family hx of diabetes. Botox coordinator is working on approval for injections. Will call pt when approved and can move next week's Botox appt up if needed. Can also further discuss starting Aimovig when she returns.  Discussed w/ Dr. Jaynee Eagles, refills e-scribed for Flexeril. Pt was advised to use Flexeril OR Xanaflex and not both.

## 2017-05-08 DIAGNOSIS — R490 Dysphonia: Secondary | ICD-10-CM | POA: Diagnosis not present

## 2017-05-08 DIAGNOSIS — Z6832 Body mass index (BMI) 32.0-32.9, adult: Secondary | ICD-10-CM | POA: Diagnosis not present

## 2017-05-08 DIAGNOSIS — F444 Conversion disorder with motor symptom or deficit: Secondary | ICD-10-CM | POA: Diagnosis not present

## 2017-05-08 LAB — CULTURE, BLOOD (SINGLE)
CULTURE: NO GROWTH
SPECIAL REQUESTS: ADEQUATE

## 2017-05-09 DIAGNOSIS — M542 Cervicalgia: Secondary | ICD-10-CM | POA: Diagnosis not present

## 2017-05-09 DIAGNOSIS — G43909 Migraine, unspecified, not intractable, without status migrainosus: Secondary | ICD-10-CM | POA: Diagnosis not present

## 2017-05-11 ENCOUNTER — Telehealth: Payer: Self-pay | Admitting: Neurology

## 2017-05-11 ENCOUNTER — Ambulatory Visit (INDEPENDENT_AMBULATORY_CARE_PROVIDER_SITE_OTHER): Payer: BLUE CROSS/BLUE SHIELD | Admitting: Neurology

## 2017-05-11 VITALS — BP 121/76 | HR 94 | Ht 73.0 in | Wt 245.6 lb

## 2017-05-11 DIAGNOSIS — G43711 Chronic migraine without aura, intractable, with status migrainosus: Secondary | ICD-10-CM | POA: Diagnosis not present

## 2017-05-11 MED ORDER — CANDESARTAN CILEXETIL 4 MG PO TABS: 8.0000 mg | ORAL_TABLET | Freq: Every day | ORAL | 6 refills | Status: DC

## 2017-05-11 NOTE — Progress Notes (Signed)
Botox 100 units/vial x 2 vials from office supply (B/B) NDC 0023-1145-01 Lot [id number] Exp 03 2021  Diluted in 4 ml of Bacteriostatic 0.9% NaCl NDC 0409-1966-02 Lot [id number] 

## 2017-05-11 NOTE — Patient Instructions (Signed)
Candesartan tablets What is this medicine? CANDESARTAN (kan des AR tan) is used to treat high blood pressure in children and adults. This drug is also used to treat adults with heart failure. This medicine may be used for other purposes; ask your health care provider or pharmacist if you have questions. COMMON BRAND NAME(S): Atacand What should I tell my health care provider before I take this medicine? They need to know if you have any of these conditions: -heart failure -kidney or liver disease -if you are on a special diet, such as a low salt diet -an unusual or allergic reaction to candesartan, other medicines, foods, dyes, or preservatives -pregnant or trying to get pregnant -breast-feeding How should I use this medicine? Take this medicine by mouth with a glass of water. Follow the directions on the prescription label. This medicine can be taken with or without food. Take your doses at regular intervals. Do not take your medicine more often than directed. Talk to your pediatrician regarding the use of this medicine in children. While this drug may be prescribed for children as young as 1 year for selected conditions, precautions do apply. For children who are unable to swallow tablets, the pharmacist can prepare the medicine in an oral suspension. Overdosage: If you think you have taken too much of this medicine contact a poison control center or emergency room at once. NOTE: This medicine is only for you. Do not share this medicine with others. What if I miss a dose? If you miss a dose, take it as soon as you can. If it is almost time for your next dose, take only that dose. Do not take double or extra doses. What may interact with this medicine? -blood pressure medicines -diuretics, especially triamterene, spironolactone, or amiloride -lithium -NSAIDs, medicines for pain and inflammation, like ibuprofen or naproxen -potassium salts or potassium supplements This list may not describe  all possible interactions. Give your health care provider a list of all the medicines, herbs, non-prescription drugs, or dietary supplements you use. Also tell them if you smoke, drink alcohol, or use illegal drugs. Some items may interact with your medicine. What should I watch for while using this medicine? Visit your doctor or health care professional for regular checks on your progress. Check your blood pressure as directed. Ask your doctor or health care professional what your blood pressure should be and when you should contact him or her. Call your doctor or health care professional if you notice an irregular or fast heart beat. Women should inform their doctor if they wish to become pregnant or think they might be pregnant. There is a potential for serious side effects to an unborn child, particularly in the second or third trimester. Talk to your health care professional or pharmacist for more information. You may get drowsy or dizzy. Do not drive, use machinery, or do anything that needs mental alertness until you know how this drug affects you. Do not stand or sit up quickly, especially if you are an older patient. This reduces the risk of dizzy or fainting spells. Avoid salt substitutes unless you are told otherwise by your doctor or health care professional. Do not treat yourself for coughs, colds, or pain while you are taking this medicine without asking your doctor or health care professional for advice. Some ingredients may increase your blood pressure. What side effects may I notice from receiving this medicine? Side effects that you should report to your doctor or health care professional as soon  as possible: -allergic reactions like skin rash, itching or hives, swelling of the face, lips, or tongue -breathing problems -chest pain -decreased amount of urine passed -fast or irregular heart beat -feeling faint or lightheaded, falls -swelling of your hands or feet Side effects that  usually do not require medical attention (report to your doctor or health care professional if they continue or are bothersome): -change in sex drive or performance -cough -headache -nausea or stomach pain This list may not describe all possible side effects. Call your doctor for medical advice about side effects. You may report side effects to FDA at 1-800-FDA-1088. Where should I keep my medicine? Keep out of the reach of children. Store at room temperature between 15 and 30 degrees C (59 and 86 degrees F). Protect from light. Keep container tightly closed. Throw away any unused medicine after the expiration date. NOTE: This sheet is a summary. It may not cover all possible information. If you have questions about this medicine, talk to your doctor, pharmacist, or health care provider.  2018 Elsevier/Gold Standard (2008-07-29 10:07:40)

## 2017-05-11 NOTE — Telephone Encounter (Signed)
Pt needs botox °

## 2017-05-11 NOTE — Progress Notes (Signed)

## 2017-05-16 DIAGNOSIS — M542 Cervicalgia: Secondary | ICD-10-CM | POA: Diagnosis not present

## 2017-05-16 DIAGNOSIS — G43909 Migraine, unspecified, not intractable, without status migrainosus: Secondary | ICD-10-CM | POA: Diagnosis not present

## 2017-05-16 NOTE — Telephone Encounter (Signed)
I called to schedule the patient for her next injection but she did not answer so I left a VM asking her to call me back.  °

## 2017-05-23 ENCOUNTER — Telehealth: Payer: Self-pay | Admitting: Neurology

## 2017-05-23 DIAGNOSIS — G43909 Migraine, unspecified, not intractable, without status migrainosus: Secondary | ICD-10-CM | POA: Diagnosis not present

## 2017-05-23 DIAGNOSIS — M542 Cervicalgia: Secondary | ICD-10-CM | POA: Diagnosis not present

## 2017-05-23 MED ORDER — METHYLPREDNISOLONE 4 MG PO TBPK: ORAL_TABLET | ORAL | Status: DC

## 2017-05-23 NOTE — Addendum Note (Signed)
Addended by: [place] Ravens I on: 05/23/2017 02:48 PM   Modules accepted: Orders

## 2017-05-23 NOTE — Telephone Encounter (Signed)
Pt called the clinic HA is now at 9/10 and has been constant since Saturday. She has n&v, dizzy, diarrhea, passing out since Saturday. Pt went to [facility] today and it has gotten worse. Please call. Pt is aware Dr Jaynee Eagles is out of the office and message will be sent to on call RN.

## 2017-05-23 NOTE — Telephone Encounter (Signed)
I have spoken with Denise Avila. She sts. h/a was improved for one wk. following Botox inj.  Has been a 9-10 for 5-6 days.  Having n/v/d, dizziness. AA is ooo this wk. Per [facility], ok for Medrol dose pk. Pt. is agreeable. Rx. faxed to [facility] [address] per her request/fim

## 2017-05-29 DIAGNOSIS — G43909 Migraine, unspecified, not intractable, without status migrainosus: Secondary | ICD-10-CM | POA: Diagnosis not present

## 2017-05-29 DIAGNOSIS — M542 Cervicalgia: Secondary | ICD-10-CM | POA: Diagnosis not present

## 2017-05-31 DIAGNOSIS — M170 Bilateral primary osteoarthritis of knee: Secondary | ICD-10-CM | POA: Diagnosis not present

## 2017-05-31 DIAGNOSIS — M25561 Pain in right knee: Secondary | ICD-10-CM | POA: Diagnosis not present

## 2017-05-31 DIAGNOSIS — M25562 Pain in left knee: Secondary | ICD-10-CM | POA: Diagnosis not present

## 2017-05-31 DIAGNOSIS — M2242 Chondromalacia patellae, left knee: Secondary | ICD-10-CM | POA: Diagnosis not present

## 2017-06-06 DIAGNOSIS — Z8719 Personal history of other diseases of the digestive system: Secondary | ICD-10-CM | POA: Insufficient documentation

## 2017-06-06 DIAGNOSIS — R197 Diarrhea, unspecified: Secondary | ICD-10-CM | POA: Diagnosis not present

## 2017-06-06 DIAGNOSIS — Z9049 Acquired absence of other specified parts of digestive tract: Secondary | ICD-10-CM | POA: Diagnosis not present

## 2017-06-06 DIAGNOSIS — Z9889 Other specified postprocedural states: Secondary | ICD-10-CM | POA: Diagnosis not present

## 2017-06-07 ENCOUNTER — Other Ambulatory Visit: Payer: Self-pay | Admitting: Neurology

## 2017-06-08 DIAGNOSIS — R197 Diarrhea, unspecified: Secondary | ICD-10-CM | POA: Diagnosis not present

## 2017-06-08 DIAGNOSIS — Z9889 Other specified postprocedural states: Secondary | ICD-10-CM | POA: Diagnosis not present

## 2017-06-08 DIAGNOSIS — R6889 Other general symptoms and signs: Secondary | ICD-10-CM | POA: Insufficient documentation

## 2017-06-08 DIAGNOSIS — E2740 Unspecified adrenocortical insufficiency: Secondary | ICD-10-CM | POA: Diagnosis not present

## 2017-06-08 DIAGNOSIS — R946 Abnormal results of thyroid function studies: Secondary | ICD-10-CM | POA: Diagnosis not present

## 2017-06-08 DIAGNOSIS — Z79890 Hormone replacement therapy (postmenopausal): Secondary | ICD-10-CM | POA: Diagnosis not present

## 2017-06-08 DIAGNOSIS — E8940 Asymptomatic postprocedural ovarian failure: Secondary | ICD-10-CM | POA: Diagnosis not present

## 2017-06-08 DIAGNOSIS — B370 Candidal stomatitis: Secondary | ICD-10-CM | POA: Diagnosis not present

## 2017-06-09 DIAGNOSIS — B370 Candidal stomatitis: Secondary | ICD-10-CM | POA: Diagnosis not present

## 2017-06-09 DIAGNOSIS — R946 Abnormal results of thyroid function studies: Secondary | ICD-10-CM | POA: Diagnosis not present

## 2017-06-09 DIAGNOSIS — E2740 Unspecified adrenocortical insufficiency: Secondary | ICD-10-CM | POA: Diagnosis not present

## 2017-06-12 ENCOUNTER — Telehealth: Payer: Self-pay | Admitting: Neurology

## 2017-06-12 ENCOUNTER — Other Ambulatory Visit: Payer: Self-pay | Admitting: Neurology

## 2017-06-12 MED ORDER — OLANZAPINE 5 MG PO TABS: 5.0000 mg | ORAL_TABLET | Freq: Every day | ORAL | 0 refills | Status: DC

## 2017-06-12 NOTE — Telephone Encounter (Signed)
Pt states after the steroid pack which helped for a few days has not worked long term.. Pt has not been able to get rid of her migraine and would like a call back

## 2017-06-12 NOTE — Telephone Encounter (Signed)
Called patient to get further clarification on her headache. Pt states that its the same headache that she has been struggling with. During Dr Cathren Laine vacation the work in doc had prescribed the patient a steroid pack which while taking that the patient had 2 good days. She was told to get full affects she needed to wait 7 days after taking the medication to see if she got any relief. The patient stated that she has tried hot/cold compresses, [facility], and being in dark quiet room. Despite doing these things the headache has worsened. I informed the patient that I would talk with Dr Jaynee Eagles and see what recommendations she has moving forward.

## 2017-06-12 NOTE — Telephone Encounter (Signed)
Denise Avila, I would like to try Zyprexa 5mg  daily for 5 days. Its something she has not tried. Would you please review common side effects? This is not a complete list, stop for any side effects. If she agrees I can send it in thanks.   akathisia (an inability to sit still), constipation, headache, dizziness, lightheadedness, drowsiness, tiredness, restlessness, weight gain (more likely in teenagers), increased appetite, memory problems, stomach pain or upset, loss of bladder control, back pain, pain in your arms or legs, numbness or tingly feeling, breast swelling or discharge (in women or men), dry mouth, or missed menstrual periods.

## 2017-06-12 NOTE — Telephone Encounter (Signed)
Pt states she is willing to try this medication. I have ordered it per Dr Jaynee Eagles thru verbal order and order sent to her pharmacy which I confirmed on the phone with her.

## 2017-06-14 DIAGNOSIS — M1712 Unilateral primary osteoarthritis, left knee: Secondary | ICD-10-CM | POA: Diagnosis not present

## 2017-06-14 DIAGNOSIS — G8929 Other chronic pain: Secondary | ICD-10-CM | POA: Diagnosis not present

## 2017-06-14 DIAGNOSIS — M2242 Chondromalacia patellae, left knee: Secondary | ICD-10-CM | POA: Diagnosis not present

## 2017-06-14 DIAGNOSIS — M25562 Pain in left knee: Secondary | ICD-10-CM | POA: Diagnosis not present

## 2017-06-21 ENCOUNTER — Emergency Department
Admission: EM | Admit: 2017-06-21 | Discharge: 2017-06-21 | Disposition: A | Discharge status: Home / Self Care | Payer: BLUE CROSS/BLUE SHIELD | Attending: Emergency Medicine | Admitting: Emergency Medicine

## 2017-06-21 ENCOUNTER — Encounter: Payer: Self-pay | Admitting: Emergency Medicine

## 2017-06-21 DIAGNOSIS — Z791 Long term (current) use of non-steroidal anti-inflammatories (NSAID): Secondary | ICD-10-CM | POA: Insufficient documentation

## 2017-06-21 DIAGNOSIS — M2242 Chondromalacia patellae, left knee: Secondary | ICD-10-CM | POA: Diagnosis not present

## 2017-06-21 DIAGNOSIS — Z79899 Other long term (current) drug therapy: Secondary | ICD-10-CM | POA: Insufficient documentation

## 2017-06-21 DIAGNOSIS — M1712 Unilateral primary osteoarthritis, left knee: Secondary | ICD-10-CM | POA: Diagnosis not present

## 2017-06-21 DIAGNOSIS — G43101 Migraine with aura, not intractable, with status migrainosus: Secondary | ICD-10-CM | POA: Insufficient documentation

## 2017-06-21 DIAGNOSIS — G8929 Other chronic pain: Secondary | ICD-10-CM | POA: Diagnosis not present

## 2017-06-21 DIAGNOSIS — M25562 Pain in left knee: Secondary | ICD-10-CM | POA: Diagnosis not present

## 2017-06-21 DIAGNOSIS — R51 Headache: Secondary | ICD-10-CM | POA: Diagnosis not present

## 2017-06-21 LAB — COMPREHENSIVE METABOLIC PANEL
ALT: 36 U/L (ref 14–54)
AST: 33 U/L (ref 15–41)
Albumin: 3.9 g/dL (ref 3.5–5.0)
Alkaline Phosphatase: 65 U/L (ref 38–126)
Anion gap: 8 (ref 5–15)
BUN: 14 mg/dL (ref 6–20)
CHLORIDE: 109 mmol/L (ref 101–111)
CO2: 22 mmol/L (ref 22–32)
CREATININE: 0.64 mg/dL (ref 0.44–1.00)
Calcium: 8.9 mg/dL (ref 8.9–10.3)
Glucose, Bld: 120 mg/dL — ABNORMAL HIGH (ref 65–99)
POTASSIUM: 3.6 mmol/L (ref 3.5–5.1)
SODIUM: 139 mmol/L (ref 135–145)
Total Bilirubin: 0.5 mg/dL (ref 0.3–1.2)
Total Protein: 7.2 g/dL (ref 6.5–8.1)

## 2017-06-21 LAB — CBC
HCT: 30.8 % — ABNORMAL LOW (ref 35.0–47.0)
HEMOGLOBIN: 10.2 g/dL — AB (ref 12.0–16.0)
MCH: 25.9 pg — AB (ref 26.0–34.0)
MCHC: 33.3 g/dL (ref 32.0–36.0)
MCV: 77.9 fL — AB (ref 80.0–100.0)
PLATELETS: 241 10*3/uL (ref 150–440)
RBC: 3.95 MIL/uL (ref 3.80–5.20)
RDW: 15.8 % — ABNORMAL HIGH (ref 11.5–14.5)
WBC: 6.4 10*3/uL (ref 3.6–11.0)

## 2017-06-21 LAB — URINALYSIS, COMPLETE (UACMP) WITH MICROSCOPIC
BILIRUBIN URINE: NEGATIVE
Bacteria, UA: NONE SEEN
Glucose, UA: NEGATIVE mg/dL
HGB URINE DIPSTICK: NEGATIVE
KETONES UR: NEGATIVE mg/dL
LEUKOCYTES UA: NEGATIVE
Nitrite: NEGATIVE
PROTEIN: NEGATIVE mg/dL
Specific Gravity, Urine: 1.019 (ref 1.005–1.030)
pH: 5 (ref 5.0–8.0)

## 2017-06-21 LAB — LIPASE, BLOOD: LIPASE: 34 U/L (ref 11–51)

## 2017-06-21 MED ORDER — SODIUM CHLORIDE 0.9 % IV BOLUS (SEPSIS)
1000.0000 mL | Freq: Once | INTRAVENOUS | Status: AC
  Administered 2017-06-21: 1000 mL via INTRAVENOUS

## 2017-06-21 MED ORDER — DIPHENHYDRAMINE HCL 50 MG/ML IJ SOLN
12.5000 mg | Freq: Once | INTRAMUSCULAR | Status: AC
  Administered 2017-06-21: 12.5 mg via INTRAVENOUS
  Filled 2017-06-21: qty 1

## 2017-06-21 MED ORDER — DEXAMETHASONE SODIUM PHOSPHATE 10 MG/ML IJ SOLN
10.0000 mg | Freq: Once | INTRAMUSCULAR | Status: AC
  Administered 2017-06-21: 10 mg via INTRAVENOUS
  Filled 2017-06-21: qty 1

## 2017-06-21 MED ORDER — MAGNESIUM SULFATE 2 GM/50ML IV SOLN
2.0000 g | Freq: Once | INTRAVENOUS | Status: AC
  Administered 2017-06-21: 2 g via INTRAVENOUS
  Filled 2017-06-21: qty 50

## 2017-06-21 MED ORDER — KETOROLAC TROMETHAMINE 30 MG/ML IJ SOLN
15.0000 mg | Freq: Once | INTRAMUSCULAR | Status: AC
  Administered 2017-06-21: 15 mg via INTRAVENOUS
  Filled 2017-06-21: qty 1

## 2017-06-21 MED ORDER — METOCLOPRAMIDE HCL 5 MG/ML IJ SOLN
10.0000 mg | Freq: Once | INTRAMUSCULAR | Status: AC
  Administered 2017-06-21: 10 mg via INTRAVENOUS
  Filled 2017-06-21: qty 2

## 2017-06-21 MED ORDER — DEXAMETHASONE SODIUM PHOSPHATE 10 MG/ML IJ SOLN
INTRAMUSCULAR | Status: AC
  Administered 2017-06-21: 10 mg via INTRAVENOUS
  Filled 2017-06-21: qty 1

## 2017-06-21 MED ORDER — ONDANSETRON 4 MG PO TBDP
4.0000 mg | ORAL_TABLET | Freq: Once | ORAL | Status: AC | PRN
  Administered 2017-06-21: 4 mg via ORAL
  Filled 2017-06-21: qty 1

## 2017-06-21 NOTE — ED Notes (Signed)
Pt asleep. Visitor denies any needs. Lights dimmed, side rails up, blanket covering pt.

## 2017-06-21 NOTE — ED Notes (Signed)
Pt ambulatory at discharge and in NAD at this time. Pt denies having questions at this time and verbalized understanding discharge papers and follow up.

## 2017-06-21 NOTE — Telephone Encounter (Signed)
Patient called office in reference to OLANZapine (ZYPREXA) 5 MG tablet(Expired) took last tablet today, and would like to see if there is another medication she can try or if she can do another infusion.  Patient has been throwing up, more diarrhea then normal, and makes her feel like crap. Pharmacy- [facility]. Please call

## 2017-06-21 NOTE — Telephone Encounter (Signed)
I called pt and she states that she has had this headache for 3-4 wks now.  Has been on the medications prescribed that she takes daily which have not helped.  Zyrexa that she tried for 5 days did not help and caused vomiting, diarrhea, and swelling.  Level she states has been constant level 8-10.  Lights and sounds are a factor.  I advised to go to ED. (she may be dehydrated as well).  She wanted Dr. Jaynee Eagles to know.  I will call her back on mobile.

## 2017-06-21 NOTE — Telephone Encounter (Signed)
Having nausea/vomiting, diarrhea.  Would like another med or infusion.  Please advise.

## 2017-06-21 NOTE — ED Provider Notes (Signed)
[facility] Emergency Department Provider Note  ____________________________________________  Time seen: Approximately 7:52 PM  I have reviewed the triage vital signs and the nursing notes.   HISTORY  Chief Complaint Headache; Leg Swelling; and Emesis   HPI Denise Avila is a 41 y.o. female with a history of severe daily migraine headaches who presents for evaluation of a migraine headache. patient reports-this current migraine has been on for 4 weeks. It is located in the right side of her head, throbbing, visual aura, photophobia, associated with nausea and vomiting, constant, 8 out of 10, nonradiating. Pain is identical to her prior history of migraine headaches. She reports that a migraine cocktail usually helps. She is here today as well because she has noticed swelling of her hands and feet since earlier this morning. This is new for her. She has no history of heart failure. She has no shortness of breath. She has no history of thyroid disease. She has recently been started on a new medication but she does not remember which one.  Patient denies changes in vision, weakness or numbness, sudden onset HA or HA with maximal intensity at onset, fever, neck stiffness, history of immunosuppression, Jaw claudication, muscle aches, temporal artery pain, history of other household members with similar symptoms, pregnancy, clotting disorder, trauma, eye pain, recent cervical manipulation, or dizziness with headache.     Past Medical History:  Diagnosis Date  . Abnormal Pap smear of cervix 12/2014  . Arthritis   . Depression   . Endometriosis   . Fibroids    age 54  . History of kidney stones   . Migraines   . Pleurisy 02/2015   while hospitalized   . Skin cancer (melanoma) ([place])   . Syncope and collapse   . Uterine cancer [facility])     Patient Active Problem List   Diagnosis Date Noted  . Intractable chronic migraine without aura with status migrainosus  04/25/2017  . Vaginal cuff dehiscence 08/21/2015  . Vaginal bleeding   . Surgical menopause on hormone replacement therapy 06/22/2015  . Endometriosis of ovary 05/21/2015  . Abnormal uterine bleeding   . Endometriosis   . Pyelonephritis 02/02/2015  . Nephrolithiasis 02/02/2015  . Hydronephrosis 02/02/2015  . Left ureteral stone     Past Surgical History:  Procedure Laterality Date  . ABLATION     uterine  . COLECTOMY    . CYSTOSCOPY W/ URETERAL STENT PLACEMENT Left 02/02/2015   Procedure: CYSTOSCOPY WITH RETROGRADE PYELOGRAM/URETERAL STENT PLACEMENT;  Surgeon: Festus Aloe, MD;  Location: [facility];  Service: Urology;  Laterality: Left;  . CYSTOSCOPY WITH URETEROSCOPY AND STENT PLACEMENT Left 02/13/2015   Procedure: LEFT URETEROSCOPY WITH HOLMIUM LASER AND STENT PLACEMENT;  Surgeon: Festus Aloe, MD;  Location: [facility];  Service: Urology;  Laterality: Left;  . DILATION AND CURETTAGE OF UTERUS    . HOLMIUM LASER APPLICATION N/A 03/29/349   Procedure: HOLMIUM LASER APPLICATION;  Surgeon: Festus Aloe, MD;  Location: [facility];  Service: Urology;  Laterality: N/A;  . LAPAROSCOPY     x 4  . MELANOMA EXCISION  2000   melanoma removed from face, basal cell removed from nose  . REPAIR VAGINAL CUFF N/A 08/21/2015   Procedure: REPAIR VAGINAL CUFF, EXAM UNDER ANESTHESIA;  Surgeon: Everitt Amber, MD;  Location: [facility];  Service: Gynecology;  Laterality: N/A;  . ROBOTIC ASSISTED TOTAL HYSTERECTOMY WITH BILATERAL SALPINGO OOPHERECTOMY Bilateral 05/21/2015   Procedure: XI ROBOTIC ASSISTED TOTAL HYSTERECTOMY WITH BILATERAL SALPINGO OOPHORECTOMY;  Surgeon:  Everitt Amber, MD;  Location: [facility];  Service: Gynecology;  Laterality: Bilateral;    Prior to Admission medications   Medication Sig Start Date End Date Taking? Authorizing Provider  buPROPion (WELLBUTRIN XL) 150 MG 24 hr tablet Take 150 mg by mouth every morning. 02/18/17   [provider]  candesartan (ATACAND) 4 MG tablet Take 2  tablets (8 mg total) by mouth daily. (start with one tab daily for 1 week and then increase to 2 tabs daily if no side effects) 05/11/17   Melvenia Beam, MD  citalopram (CELEXA) 20 MG tablet Take 40 mg by mouth at bedtime.     [provider]  clonazePAM (KLONOPIN) 1 MG tablet Take 1 mg by mouth 3 (three) times daily. 04/03/17   [provider]  cyclobenzaprine (FLEXERIL) 10 MG tablet TAKE 1 TABLET BY MOUTH TWICE A DAY 06/07/17   Melvenia Beam, MD  diclofenac (VOLTAREN) 75 MG EC tablet Take 75 mg by mouth 2 (two) times daily.    [provider]  diphenhydrAMINE (BENADRYL) 25 MG tablet Take 100 mg by mouth every 6 (six) hours as needed for allergies. For headache 07/27/16   [provider]  diphenoxylate-atropine (LOMOTIL) 2.5-0.025 MG tablet Take 4 tablets by mouth every 12 (twelve) hours as needed for diarrhea or loose stools.  04/10/17   [provider]  estrogens, conjugated, (PREMARIN) 0.625 MG tablet Take 1 tablet (0.625 mg total) by mouth daily. Take daily for 21 days then do not take for 7 days. 06/22/15   Everitt Amber, MD  estrogens, conjugated, (PREMARIN) 0.9 MG tablet Take 0.9 mg by mouth daily. Take daily for 21 days then do not take for 7 days.    [provider]  ibuprofen (ADVIL,MOTRIN) 800 MG tablet TAKE ONE TABLET BY MOUTH EVERY 8 HOURS AS NEEDED FOR MILD PAIN 09/24/15   Joylene John D, NP  LamoTRIgine 50 MG TB24 Take 150 mg by mouth every evening.  03/02/17   [provider]  meloxicam (MOBIC) 7.5 MG tablet Take 7.5 mg by mouth 2 (two) times daily. 04/21/17 04/21/18  [provider]  methylPREDNISolone (MEDROL DOSEPAK) 4 MG TBPK tablet Take as directed--6 tablets on day 1 and decrease by 1 daily until gone 05/23/17   Sater, Nanine Means, MD  Multiple Vitamin (MULTIVITAMIN WITH MINERALS) TABS tablet Take 1 tablet by mouth daily. Occuvite eye + multi    [provider]  Multiple Vitamins-Minerals (VITEYES AREDS  FORMULA/LUTEIN) CAPS Take 1 capsule by mouth 2 (two) times daily.    [provider]  naproxen (NAPROSYN) 375 MG tablet Take 1 tablet (375 mg total) by mouth 2 (two) times daily. 05/03/17   Palumbo, April, MD  nystatin (MYCOSTATIN) 100000 UNIT/ML suspension Take 5 mLs by mouth 4 (four) times daily. 02/09/17   [provider]  OLANZapine (ZYPREXA) 5 MG tablet Take 1 tablet (5 mg total) by mouth daily. 06/12/17 06/17/17  Melvenia Beam, MD  omeprazole (PRILOSEC) 20 MG capsule Take 20 mg by mouth daily.    [provider]  ondansetron (ZOFRAN) 4 MG tablet Take 1 tablet (4 mg total) by mouth every 6 (six) hours as needed for nausea. 05/22/15   Everitt Amber, MD  promethazine (PHENERGAN) 12.5 MG tablet Take 12.5 mg by mouth 2 (two) times daily.    [provider]  sucralfate (CARAFATE) 1 G tablet Take 1 tablet (1 g total) by mouth 4 (four) times daily. Patient taking differently: Take 2 g  by mouth 2 (two) times daily.  02/24/15   Lacretia Leigh, MD  SUMAtriptan (IMITREX) 100 MG tablet Take 1 tablet (100 mg total) by mouth once as needed for migraine. May repeat in 2 hours if headache persists or recurs. Max: 2 tabs/day 04/26/17   Melvenia Beam, MD  tiZANidine (ZANAFLEX) 4 MG tablet Take 4 mg by mouth at bedtime.  03/01/17   [provider]  traZODone (DESYREL) 100 MG tablet Take 200 mg by mouth at bedtime.  09/07/15   [provider]    Allergies Penicillins and Ciprofloxacin  Family History  Problem Relation Age of Onset  . Hypertension Mother   . Cancer - Other Father        liver  . Diabetes Mellitus II Maternal Grandmother   . Heart disease Maternal Grandmother   . Breast cancer Maternal Grandmother   . Cancer - Lung Maternal Grandfather   . Osteoporosis Paternal Grandmother   . Melanoma Paternal Grandfather     Social History Social History  Substance Use Topics  . Smoking status: Never Smoker  . Smokeless tobacco: Never Used  .  Alcohol use No    Review of Systems  Constitutional: Negative for fever. Eyes: Negative for visual changes. ENT: Negative for sore throat. Neck: No neck pain  Cardiovascular: Negative for chest pain. Respiratory: Negative for shortness of breath. Gastrointestinal: Negative for abdominal pain,r diarrhea. + N/V Genitourinary: Negative for dysuria. Musculoskeletal: Negative for back pain. + feet and hand swelling Skin: Negative for rash. Neurological: Negative for  weakness or numbness. + HA, photophobia, visual floaters Psych: No SI or HI  ____________________________________________   PHYSICAL EXAM:  VITAL SIGNS: ED Triage Vitals  Enc Vitals Group     BP 06/21/17 1735 114/70     Pulse Rate 06/21/17 1735 (!) 103     Resp 06/21/17 1735 18     Temp 06/21/17 1735 98.9 F (37.2 C)     Temp Source 06/21/17 1735 Oral     SpO2 06/21/17 1735 98 %     Weight 06/21/17 1735 244 lb (110.7 kg)     Height 06/21/17 1735 6\' 1"  (1.854 m)     Head Circumference --      Peak Flow --      Pain Score 06/21/17 1734 8     Pain Loc --      Pain Edu? --      Excl. in [place]? --     Constitutional: Alert and oriented. Patient laying in the dark and in mild distress due to pain HEENT:      Head: Normocephalic and atraumatic.         Eyes: Conjunctivae are normal. Sclera is non-icteric.       Mouth/Throat: Mucous membranes are moist.       Neck: Supple with no signs of meningismus. Cardiovascular: Tachycardic with regular rhythm. No murmurs, gallops, or rubs. 2+ symmetrical distal pulses are present in all extremities. No JVD. Respiratory: Normal respiratory effort. Lungs are clear to auscultation bilaterally. No wheezes, crackles, or rhonchi.  Gastrointestinal: Soft, non tender, and non distended with positive bowel sounds. No rebound or guarding. Genitourinary: No CVA tenderness. Musculoskeletal: Nontender with normal range of motion in all extremities. No edema, cyanosis, or erythema of  extremities. Neurologic: Normal speech and language. Face is symmetric. Moving all extremities. PERRL, EOMI. Intact strength and sensation x 4. No gross focal neurologic deficits are appreciated. Skin: Skin is warm, dry and intact. No rash noted. Psychiatric:  Mood and affect are normal. Speech and behavior are normal.  ____________________________________________   LABS (all labs ordered are listed, but only abnormal results are displayed)  Labs Reviewed  COMPREHENSIVE METABOLIC PANEL - Abnormal; Notable for the following:       Result Value   Glucose, Bld 120 (*)    All other components within normal limits  CBC - Abnormal; Notable for the following:    Hemoglobin 10.2 (*)    HCT 30.8 (*)    MCV 77.9 (*)    MCH 25.9 (*)    RDW 15.8 (*)    All other components within normal limits  URINALYSIS, COMPLETE (UACMP) WITH MICROSCOPIC - Abnormal; Notable for the following:    Color, Urine YELLOW (*)    APPearance CLOUDY (*)    Squamous Epithelial / LPF 0-5 (*)    All other components within normal limits  LIPASE, BLOOD   ____________________________________________  EKG  none  ____________________________________________  RADIOLOGY  none  ____________________________________________   PROCEDURES  Procedure(s) performed: None Procedures Critical Care performed:  None ____________________________________________   INITIAL IMPRESSION / ASSESSMENT AND PLAN / ED COURSE  41 y.o. female with a history of severe daily migraine headaches who presents for evaluation of a migraine headache.   Low suspicion for more serious or life threatening etiology of HA based on history and exam. No sudden onset thunderclap HA, onset with exertion, vomiting, focal neurologic deficits, to suggest increased risk of subarachnoid hemorrhage. No fever, neck pain, neck stiffness, or meningismus on exam to suggest meningitis. No fevers, altered mental status, unusual behavior to suggest encephalitis.  No focal neurologic deficits by history or exam to suggest central venous thrombosis. No constitutional symptoms including fever, fatigue, weight loss, temporal scalp tenderness, jaw claudication, visual loss, to suggest temporal arteritis. No immunocompromise to suggest increased risk for intracranial infectious disease. No visual changes or findings on ocular exam to suggest acute angle closure glaucoma. No reports of toxic exposures including carbon monoxide or other household members with similar symptoms.  Will treat with migraine cocktail, magnesium, and decadron.  Patient also complaining of swelling of her bilateral feet and hands. There is no evidence of pitting edema, or cellulitis. Lungs are clear. No history of heart failure. I'm concerned this could be an allergic reaction from a recent medication the patient has been started on. I recommended that she discuss that with her neurologist. Her kidney function is normal, she has normal protein in her serum and no protein in her urine.   ----------------------------------------- 6:36 PM on 06/21/2017 ----------------------------------------- OBSERVATION CARE: This patient is being placed under observation care for the following reasons: Headache patients requiring repeat treatments and serial exams to determine if they  improve with treatment   Clinical Course as of Jun 21 2156  Wed Jun 21, 2017  2112 Patient sleeping comfortably.  [CV]    Clinical Course User Index [CV] Alfred Levins [place], MD    ----------------------------------------- 9:56 PM on 06/21/2017 -----------------------------------------   END OF OBSERVATION STATUS: After an appropriate period of observation, this patient is being discharged due to the following reason(s):  Patient has been sleeping soundly in the ED for 2 hours. HA is improved. Will dc home with close f/u with Neurologist/   Pertinent labs & imaging results that were available during my care of  the patient were reviewed by me and considered in my medical decision making (see chart for details).    ____________________________________________   FINAL CLINICAL IMPRESSION(S) / ED DIAGNOSES  Final  diagnoses:  Migraine with aura and with status migrainosus, not intractable      NEW MEDICATIONS STARTED DURING THIS VISIT:  New Prescriptions   No medications on file     Note:  This document was prepared using Dragon voice recognition software and may include unintentional dictation errors.    Rudene Re, MD 06/21/17 2157

## 2017-06-21 NOTE — Telephone Encounter (Signed)
I spoke to pt and relayed that per Dr. Jaynee Eagles to proceed to ED for evaluation.  Pt verbalized understanding.

## 2017-06-21 NOTE — ED Triage Notes (Signed)
Pt from home with headache x 4 weeks; n/v/d since 2017 (had colon removed - this is normal for her), dehydration. Pt states that she has to come in to receive iv fluids periodically for dehydration due to the n/v. She states that she has migraines periodically and that her neurologist told her to come in to ED. Pt alert & oriented with NAD noted.

## 2017-06-21 NOTE — Telephone Encounter (Signed)
Is this the same headache she has had for several weeks? Can you call and get a history please? Severity, timing, associated symptoms etc? She may need to go to the ED if this is the same headache or come in and see Meghan or Chrys Racer thanks

## 2017-06-21 NOTE — ED Notes (Signed)
Pt states migraine, N&V, dehydration, and swelling "lots and lots of swelling." State R sided migraine. States sensitive to light, sound, and movement. Migraine present for 4 weeks. Took a steroid and said that she was better. Hx of migraine. Sees Dr. Cherlyn Cushing at [facility]. Is supposed to see headache institution October 11.

## 2017-06-22 ENCOUNTER — Other Ambulatory Visit: Payer: Self-pay | Admitting: Neurology

## 2017-06-25 ENCOUNTER — Emergency Department (HOSPITAL_COMMUNITY)
Admission: EM | Admit: 2017-06-25 | Discharge: 2017-06-25 | Disposition: A | Discharge status: Home / Self Care | Payer: BLUE CROSS/BLUE SHIELD | Attending: Emergency Medicine | Admitting: Emergency Medicine

## 2017-06-25 ENCOUNTER — Encounter (HOSPITAL_COMMUNITY): Payer: Self-pay | Admitting: *Deleted

## 2017-06-25 ENCOUNTER — Emergency Department (HOSPITAL_COMMUNITY): Payer: BLUE CROSS/BLUE SHIELD

## 2017-06-25 DIAGNOSIS — R51 Headache: Secondary | ICD-10-CM | POA: Diagnosis not present

## 2017-06-25 DIAGNOSIS — R112 Nausea with vomiting, unspecified: Secondary | ICD-10-CM | POA: Diagnosis not present

## 2017-06-25 DIAGNOSIS — R4182 Altered mental status, unspecified: Secondary | ICD-10-CM | POA: Diagnosis not present

## 2017-06-25 DIAGNOSIS — J9811 Atelectasis: Secondary | ICD-10-CM | POA: Diagnosis not present

## 2017-06-25 DIAGNOSIS — Z79899 Other long term (current) drug therapy: Secondary | ICD-10-CM | POA: Diagnosis not present

## 2017-06-25 DIAGNOSIS — Z791 Long term (current) use of non-steroidal anti-inflammatories (NSAID): Secondary | ICD-10-CM | POA: Insufficient documentation

## 2017-06-25 DIAGNOSIS — R55 Syncope and collapse: Secondary | ICD-10-CM | POA: Diagnosis not present

## 2017-06-25 DIAGNOSIS — R197 Diarrhea, unspecified: Secondary | ICD-10-CM | POA: Diagnosis not present

## 2017-06-25 DIAGNOSIS — G4489 Other headache syndrome: Secondary | ICD-10-CM | POA: Diagnosis not present

## 2017-06-25 LAB — I-STAT CHEM 8, ED
BUN: 13 mg/dL (ref 6–20)
CALCIUM ION: 1.17 mmol/L (ref 1.15–1.40)
CHLORIDE: 104 mmol/L (ref 101–111)
Creatinine, Ser: 0.7 mg/dL (ref 0.44–1.00)
GLUCOSE: 127 mg/dL — AB (ref 65–99)
HCT: 27 % — ABNORMAL LOW (ref 36.0–46.0)
Hemoglobin: 9.2 g/dL — ABNORMAL LOW (ref 12.0–15.0)
Potassium: 3.8 mmol/L (ref 3.5–5.1)
Sodium: 140 mmol/L (ref 135–145)
TCO2: 25 mmol/L (ref 22–32)

## 2017-06-25 LAB — I-STAT TROPONIN, ED: Troponin i, poc: 0 ng/mL (ref 0.00–0.08)

## 2017-06-25 MED ORDER — METHYLPREDNISOLONE SODIUM SUCC 125 MG IJ SOLR
125.0000 mg | Freq: Once | INTRAMUSCULAR | Status: AC
  Administered 2017-06-25: 125 mg via INTRAVENOUS
  Filled 2017-06-25: qty 2

## 2017-06-25 MED ORDER — HYDROMORPHONE HCL 1 MG/ML IJ SOLN
1.0000 mg | Freq: Once | INTRAMUSCULAR | Status: AC
  Administered 2017-06-25: 1 mg via INTRAVENOUS
  Filled 2017-06-25: qty 1

## 2017-06-25 MED ORDER — HYDROMORPHONE HCL 1 MG/ML IJ SOLN
INTRAMUSCULAR | Status: AC
  Filled 2017-06-25: qty 1

## 2017-06-25 NOTE — ED Triage Notes (Signed)
Syncopal episode at home, able to answer all questions on arrival.

## 2017-06-25 NOTE — ED Provider Notes (Signed)
[facility] DEPT Provider Note   CSN: [id number] Arrival date & time: 06/25/17  2027     History   Chief Complaint Chief Complaint  Patient presents with  . Loss of Consciousness    HPI Denise Avila is a 41 y.o. female.  Patient fell out of her bed and lost consciousness.   The history is provided by the patient.  Loss of Consciousness   This is a new problem. The current episode started 1 to 2 hours ago. The problem occurs rarely. The problem has been resolved. She lost consciousness for a period of less than one minute. Associated with: laying in bed. Pertinent negatives include abdominal pain, back pain, chest pain, congestion, fever, headaches and seizures.    Past Medical History:  Diagnosis Date  . Abnormal Pap smear of cervix 12/2014  . Arthritis   . Depression   . Endometriosis   . Fibroids    age 30  . History of kidney stones   . Migraines   . Pleurisy 02/2015   while hospitalized   . Skin cancer (melanoma) ([place])   . Syncope and collapse   . Uterine cancer [facility])     Patient Active Problem List   Diagnosis Date Noted  . Intractable chronic migraine without aura with status migrainosus 04/25/2017  . Vaginal cuff dehiscence 08/21/2015  . Vaginal bleeding   . Surgical menopause on hormone replacement therapy 06/22/2015  . Endometriosis of ovary 05/21/2015  . Abnormal uterine bleeding   . Endometriosis   . Pyelonephritis 02/02/2015  . Nephrolithiasis 02/02/2015  . Hydronephrosis 02/02/2015  . Left ureteral stone     Past Surgical History:  Procedure Laterality Date  . ABLATION     uterine  . COLECTOMY    . CYSTOSCOPY W/ URETERAL STENT PLACEMENT Left 02/02/2015   Procedure: CYSTOSCOPY WITH RETROGRADE PYELOGRAM/URETERAL STENT PLACEMENT;  Surgeon: Festus Aloe, MD;  Location: [facility];  Service: Urology;  Laterality: Left;  . CYSTOSCOPY WITH URETEROSCOPY AND STENT PLACEMENT Left 02/13/2015   Procedure: LEFT URETEROSCOPY WITH HOLMIUM LASER AND  STENT PLACEMENT;  Surgeon: Festus Aloe, MD;  Location: [facility];  Service: Urology;  Laterality: Left;  . DILATION AND CURETTAGE OF UTERUS    . HOLMIUM LASER APPLICATION N/A 2/77/8242   Procedure: HOLMIUM LASER APPLICATION;  Surgeon: Festus Aloe, MD;  Location: [facility];  Service: Urology;  Laterality: N/A;  . LAPAROSCOPY     x 4  . MELANOMA EXCISION  2000   melanoma removed from face, basal cell removed from nose  . REPAIR VAGINAL CUFF N/A 08/21/2015   Procedure: REPAIR VAGINAL CUFF, EXAM UNDER ANESTHESIA;  Surgeon: Everitt Amber, MD;  Location: [facility];  Service: Gynecology;  Laterality: N/A;  . ROBOTIC ASSISTED TOTAL HYSTERECTOMY WITH BILATERAL SALPINGO OOPHERECTOMY Bilateral 05/21/2015   Procedure: XI ROBOTIC ASSISTED TOTAL HYSTERECTOMY WITH BILATERAL SALPINGO OOPHORECTOMY;  Surgeon: Everitt Amber, MD;  Location: [facility];  Service: Gynecology;  Laterality: Bilateral;    OB History    No data available       Home Medications    Prior to Admission medications   Medication Sig Start Date End Date Taking? Authorizing Provider  beta carotene w/minerals (OCUVITE) tablet Take 1 tablet by mouth daily.   Yes [provider]  buPROPion (WELLBUTRIN XL) 150 MG 24 hr tablet Take 150 mg by mouth every morning. 02/18/17  Yes [provider]  candesartan (ATACAND) 4 MG tablet Take 2 tablets (8 mg total) by mouth daily. (start with one tab daily  for 1 week and then increase to 2 tabs daily if no side effects) Patient taking differently: Take 8 mg by mouth daily.  05/11/17  Yes Melvenia Beam, MD  citalopram (CELEXA) 20 MG tablet Take 30 mg by mouth at bedtime.    Yes [provider]  clonazePAM (KLONOPIN) 1 MG tablet Take 1 mg by mouth 3 (three) times daily. 04/03/17  Yes [provider]  cyclobenzaprine (FLEXERIL) 10 MG tablet TAKE 1 TABLET BY MOUTH TWICE A DAY 06/07/17  Yes Melvenia Beam, MD  diphenhydrAMINE (BENADRYL) 25 MG tablet Take 100 mg by mouth every 6 (six)  hours as needed for allergies. For headache 07/27/16  Yes [provider]  diphenoxylate-atropine (LOMOTIL) 2.5-0.025 MG tablet Take 2 tablets by mouth every 12 (twelve) hours as needed for diarrhea or loose stools.  04/10/17  Yes [provider]  estrogens, conjugated, (PREMARIN) 0.625 MG tablet Take 1 tablet (0.625 mg total) by mouth daily. Take daily for 21 days then do not take for 7 days. 06/22/15  Yes Everitt Amber, MD  estrogens, conjugated, (PREMARIN) 0.9 MG tablet Take 0.9 mg by mouth daily. Take daily for 21 days then do not take for 7 days.   Yes [provider]  LamoTRIgine 50 MG TB24 Take 200 mg by mouth every evening.  03/02/17  Yes [provider]  linaclotide (LINZESS) 145 MCG CAPS capsule Take 145 mcg by mouth daily.   Yes [provider]  meloxicam (MOBIC) 7.5 MG tablet Take 7.5 mg by mouth 2 (two) times daily. 04/21/17 04/21/18 Yes [provider]  naproxen (NAPROSYN) 500 MG tablet Take 500 mg by mouth 2 (two) times daily as needed.   Yes [provider]  OLANZapine (ZYPREXA) 5 MG tablet Take 1 tablet (5 mg total) by mouth daily. 06/12/17 06/25/17 Yes Melvenia Beam, MD  omeprazole (PRILOSEC) 20 MG capsule Take 20 mg by mouth daily.   Yes [provider]  ondansetron (ZOFRAN) 4 MG tablet Take 1 tablet (4 mg total) by mouth every 6 (six) hours as needed for nausea. Patient taking differently: Take 8 mg by mouth every 8 (eight) hours as needed for nausea.  05/22/15  Yes Everitt Amber, MD  oxyCODONE (OXY IR/ROXICODONE) 5 MG immediate release tablet Take 5-10 mg by mouth every 4 (four) hours as needed for severe pain.   Yes [provider]  promethazine (PHENERGAN) 12.5 MG tablet Take 12.5 mg by mouth 2 (two) times daily.   Yes [provider]  sucralfate (CARAFATE) 1 G tablet Take 1 tablet (1 g total) by mouth 4 (four) times daily. Patient taking differently: Take 2 g by mouth 2 (two) times daily.  02/24/15   Yes Lacretia Leigh, MD  SUMAtriptan (IMITREX) 100 MG tablet Take 1 tablet (100 mg total) by mouth once as needed for migraine. May repeat in 2 hours if headache persists or recurs. Max: 2 tabs/day 04/26/17  Yes Melvenia Beam, MD  tiZANidine (ZANAFLEX) 4 MG tablet Take 12 mg by mouth at bedtime.  03/01/17  Yes [provider]  traZODone (DESYREL) 100 MG tablet Take 200 mg by mouth at bedtime.  09/07/15  Yes [provider]  nystatin (MYCOSTATIN) 100000 UNIT/ML suspension Take 5 mLs by mouth 4 (four) times daily. 02/09/17   [provider]    Family History Family History  Problem Relation Age of Onset  . Hypertension Mother   . Cancer - Other Father  liver  . Diabetes Mellitus II Maternal Grandmother   . Heart disease Maternal Grandmother   . Breast cancer Maternal Grandmother   . Cancer - Lung Maternal Grandfather   . Osteoporosis Paternal Grandmother   . Melanoma Paternal Grandfather     Social History Social History  Substance Use Topics  . Smoking status: Never Smoker  . Smokeless tobacco: Never Used  . Alcohol use No     Allergies   Penicillins; Amoxicillin; and Ciprofloxacin   Review of Systems Review of Systems  Constitutional: Negative for appetite change, fatigue and fever.  HENT: Negative for congestion, ear discharge and sinus pressure.   Eyes: Negative for discharge.  Respiratory: Negative for cough.   Cardiovascular: Positive for syncope. Negative for chest pain.  Gastrointestinal: Negative for abdominal pain and diarrhea.  Genitourinary: Negative for frequency and hematuria.  Musculoskeletal: Negative for back pain.  Skin: Negative for rash.  Neurological: Negative for seizures and headaches.       Numbness to left arm  Psychiatric/Behavioral: Negative for hallucinations.     Physical Exam Updated Vital Signs BP 101/65 (BP Location: Left Arm)   Pulse 72   Temp 98.2 F (36.8 C) (Oral)   Resp 18   LMP 05/04/2015  (Approximate)   SpO2 96%   Physical Exam  Constitutional: She is oriented to person, place, and time. She appears well-developed.  HENT:  Head: Normocephalic.  Tender posterior neck  Eyes: Conjunctivae and EOM are normal. No scleral icterus.  Neck: Neck supple. No thyromegaly present.  Cardiovascular: Normal rate and regular rhythm.  Exam reveals no gallop and no friction rub.   No murmur heard. Pulmonary/Chest: No stridor. She has no wheezes. She has no rales. She exhibits tenderness.  Abdominal: She exhibits no distension. There is no tenderness. There is no rebound.  Musculoskeletal: Normal range of motion. She exhibits no edema.  Lymphadenopathy:    She has no cervical adenopathy.  Neurological: She is oriented to person, place, and time. She exhibits normal muscle tone. Coordination normal.  Skin: No rash noted. No erythema.  Psychiatric: She has a normal mood and affect. Her behavior is normal.     ED Treatments / Results  Labs (all labs ordered are listed, but only abnormal results are displayed) Labs Reviewed  I-STAT CHEM 8, ED - Abnormal; Notable for the following:       Result Value   Glucose, Bld 127 (*)    Hemoglobin 9.2 (*)    HCT 27.0 (*)    All other components within normal limits  I-STAT TROPONIN, ED    EKG  EKG Interpretation None       Radiology No results found.  Procedures Procedures (including critical care time)  Medications Ordered in ED Medications - No data to display   Initial Impression / Assessment and Plan / ED Course  I have reviewed the triage vital signs and the nursing notes.  Pertinent labs & imaging results that were available during my care of the patient were reviewed by me and considered in my medical decision making (see chart for details).     Patient fell out of bed and complains of a headache. X-rays and blood work unremarkable. Patient will follow-up her PCP  Final Clinical Impressions(s) / ED Diagnoses    Final diagnoses:  None    New Prescriptions New Prescriptions   No medications on file     Milton Ferguson, MD 06/25/17 2207

## 2017-06-25 NOTE — Discharge Instructions (Signed)
Follow up with your doctor this week if any problems

## 2017-06-27 DIAGNOSIS — K529 Noninfective gastroenteritis and colitis, unspecified: Secondary | ICD-10-CM | POA: Diagnosis not present

## 2017-06-27 DIAGNOSIS — R51 Headache: Secondary | ICD-10-CM | POA: Diagnosis not present

## 2017-06-27 DIAGNOSIS — F419 Anxiety disorder, unspecified: Secondary | ICD-10-CM | POA: Diagnosis not present

## 2017-06-27 DIAGNOSIS — F329 Major depressive disorder, single episode, unspecified: Secondary | ICD-10-CM | POA: Diagnosis not present

## 2017-06-27 DIAGNOSIS — Z23 Encounter for immunization: Secondary | ICD-10-CM | POA: Diagnosis not present

## 2017-07-03 ENCOUNTER — Other Ambulatory Visit: Payer: Self-pay | Admitting: Neurology

## 2017-07-13 DIAGNOSIS — G43719 Chronic migraine without aura, intractable, without status migrainosus: Secondary | ICD-10-CM | POA: Diagnosis not present

## 2017-07-13 DIAGNOSIS — G2581 Restless legs syndrome: Secondary | ICD-10-CM | POA: Diagnosis not present

## 2017-07-13 DIAGNOSIS — G43839 Menstrual migraine, intractable, without status migrainosus: Secondary | ICD-10-CM | POA: Diagnosis not present

## 2017-07-17 DIAGNOSIS — G894 Chronic pain syndrome: Secondary | ICD-10-CM | POA: Diagnosis not present

## 2017-07-17 DIAGNOSIS — M2550 Pain in unspecified joint: Secondary | ICD-10-CM | POA: Diagnosis not present

## 2017-07-17 DIAGNOSIS — R5382 Chronic fatigue, unspecified: Secondary | ICD-10-CM | POA: Diagnosis not present

## 2017-07-17 DIAGNOSIS — M150 Primary generalized (osteo)arthritis: Secondary | ICD-10-CM | POA: Diagnosis not present

## 2017-07-27 DIAGNOSIS — G8929 Other chronic pain: Secondary | ICD-10-CM | POA: Diagnosis not present

## 2017-07-27 DIAGNOSIS — M79631 Pain in right forearm: Secondary | ICD-10-CM | POA: Diagnosis not present

## 2017-07-27 DIAGNOSIS — M542 Cervicalgia: Secondary | ICD-10-CM | POA: Diagnosis not present

## 2017-07-27 DIAGNOSIS — R202 Paresthesia of skin: Secondary | ICD-10-CM | POA: Diagnosis not present

## 2017-07-27 DIAGNOSIS — R7982 Elevated C-reactive protein (CRP): Secondary | ICD-10-CM | POA: Insufficient documentation

## 2017-08-02 DIAGNOSIS — G43909 Migraine, unspecified, not intractable, without status migrainosus: Secondary | ICD-10-CM | POA: Diagnosis not present

## 2017-08-03 DIAGNOSIS — G8929 Other chronic pain: Secondary | ICD-10-CM | POA: Diagnosis not present

## 2017-08-03 DIAGNOSIS — M542 Cervicalgia: Secondary | ICD-10-CM | POA: Diagnosis not present

## 2017-08-07 DIAGNOSIS — M79631 Pain in right forearm: Secondary | ICD-10-CM | POA: Diagnosis not present

## 2017-08-08 DIAGNOSIS — G43711 Chronic migraine without aura, intractable, with status migrainosus: Secondary | ICD-10-CM | POA: Diagnosis not present

## 2017-08-09 ENCOUNTER — Telehealth: Payer: Self-pay | Admitting: Neurology

## 2017-08-09 ENCOUNTER — Encounter: Payer: Self-pay | Admitting: Neurology

## 2017-08-09 ENCOUNTER — Ambulatory Visit: Payer: BLUE CROSS/BLUE SHIELD | Admitting: Neurology

## 2017-08-09 VITALS — BP 147/80 | HR 87

## 2017-08-09 DIAGNOSIS — G43711 Chronic migraine without aura, intractable, with status migrainosus: Secondary | ICD-10-CM | POA: Diagnosis not present

## 2017-08-09 NOTE — Progress Notes (Signed)
Botox 200 units/ 2 mL Vial #1: LOT: [id number] EXP: 02/2020 NDC: 7703-4035-24 Vial #2 LOT: [id number] EXP: 02/2020 NDC: 3112-1624-46 SP 0.9% Bacteriostatic Sodium Chloride 4 mL Vial #1    2 mL LOT: [id number] EXP: 02/01/2019 Vial #2  2 mL LOT:  [id number] EXP: 03/03/2018 //BCrn

## 2017-08-09 NOTE — Telephone Encounter (Signed)
Per dr. Jaynee Eagles, pt needs botox in 12 weeks.

## 2017-08-09 NOTE — Progress Notes (Signed)
She has been in the emergency room since last Botox. She had a severe migraine that lasted for 4 weeks. Then had a LOC fell out of bed after she had been in bed all day.    Cannot try propranolol (due to low low BP), Topamax/zonegran contraindicated due to nephrolithiasis and dehydration, on Lamictal, depakote contraindicated due to vomiting and prior transaminitis, already on wellbutrin, celexa, trazodone, tried magnesium with diarrhea.  On Lamictal. Tried Tizanidine without relief. On Naprosyn and Phenergan for acute management, candesartan,   Start Ajovy     Consent Form Botulism Toxin Injection For Chronic Migraine  Botulism toxin has been approved by the Federal drug administration for treatment of chronic migraine. Botulism toxin does not cure chronic migraine and it may not be effective in some patients.  The administration of botulism toxin is accomplished by injecting a small amount of toxin into the muscles of the neck and head. Dosage must be titrated for each individual. Any benefits resulting from botulism toxin tend to wear off after 3 months with a repeat injection required if benefit is to be maintained. Injections are usually done every 3-4 months with maximum effect peak achieved by about 2 or 3 weeks. Botulism toxin is expensive and you should be sure of what costs you will incur resulting from the injection.  The side effects of botulism toxin use for chronic migraine may include:   -Transient, and usually mild, facial weakness with facial injections  -Transient, and usually mild, head or neck weakness with head/neck injections  -Reduction or loss of forehead facial animation due to forehead muscle              weakness  -Eyelid drooping  -Dry eye  -Pain at the site of injection or bruising at the site of injection  -Double vision  -Potential unknown long term risks  Contraindications: You should not have Botox if you are pregnant, nursing, allergic to albumin, have an  infection, skin condition, or muscle weakness at the site of the injection, or have myasthenia gravis, Lambert-Eaton syndrome, or ALS.  It is also possible that as with any injection, there may be an allergic reaction or no effect from the medication. Reduced effectiveness after repeated injections is sometimes seen and rarely infection at the injection site may occur. All care will be taken to prevent these side effects. If therapy is given over a long time, atrophy and wasting in the muscle injected may occur. Occasionally the patient's become refractory to treatment because they develop antibodies to the toxin. In this event, therapy needs to be modified.  I have read the above information and consent to the administration of botulism toxin.    ______________  _____   _________________  Patient signature     Date   Witness signature       BOTOX PROCEDURE NOTE FOR MIGRAINE HEADACHE    Contraindications and precautions discussed with patient(above). Aseptic procedure was observed and patient tolerated procedure. Procedure performed by Dr. Georgia Dom  The condition has existed for more than 6 months, and pt does not have a diagnosis of ALS, Myasthenia Gravis or Lambert-Eaton Syndrome. Risks and benefits of injections discussed and pt agrees to proceed with the procedure. Written consent obtained  These injections are medically necessary. He receives good benefits from these injections. These injections do not cause sedations or hallucinations which the oral therapies may cause.  Indication/Diagnosis: chronic migraine BOTOX(J0585) injection was performed according to protocol by [employer]. 200 units of BOTOX  was dissolved into 4 cc NS.  NDC: [phone]  Description of procedure:  The patient was placed in a sitting position. The standard protocol was used for Botox as follows, with 5 units of Botox injected at each site:   -Procerus muscle, midline injection  -Corrugator  muscle, bilateral injection  -Frontalis muscle, bilateral injection, with 2 sites each side, medial injection was performed in the upper one third of the frontalis muscle, in the region vertical from the medial inferior edge of the superior orbital rim. The lateral injection was again in the upper one third of the forehead vertically above the lateral limbus of the cornea, 1.5 cm lateral to the medial injection site.  -Temporalis muscle injection, 4 sites, bilaterally. The first injection was 3 cm above the tragus of the ear, second injection site was 1.5 cm to 3 cm up from the first injection site in line with the tragus of the ear. The third injection site was 1.5-3 cm forward between the first 2 injection sites. The fourth injection site was 1.5 cm posterior to the second injection site.  -Occipitalis muscle injection, 3 sites, bilaterally. The first injection was done one half way between the occipital protuberance and the tip of the mastoid process behind the ear. The second injection site was done lateral and superior to the first, 1 fingerbreadth from the first injection. The third injection site was 1 fingerbreadth superiorly and medially from the first injection site.  -Cervical paraspinal muscle injection, 2 sites, bilateral knee first injection site was 1 cm from the midline of the cervical spine, 3 cm inferior to the lower border of the occipital protuberance. The second injection site was 1.5 cm superiorly and laterally to the first injection site.  -Trapezius muscle injection was performed at 3 sites, bilaterally. The first injection site was in the upper trapezius muscle halfway between the inflection point of the neck, and the acromion. The second injection site was one half way between the acromion and the first injection site. The third injection was done between the first injection site and the inflection point of the neck.   Will return for repeat injection in 3 months.   A 200  unit sof Botox was used, 155 units were injected, the rest of the Botox was wasted. The patient tolerated the procedure well, there were no complications of the above procedure.

## 2017-08-10 MED ORDER — NONFORMULARY OR COMPOUNDED ITEM: 1.0000 "application " | 11 refills | Status: DC

## 2017-08-10 NOTE — Telephone Encounter (Signed)
I called and scheduled the patient.  °

## 2017-08-28 DIAGNOSIS — M542 Cervicalgia: Secondary | ICD-10-CM | POA: Diagnosis not present

## 2017-08-28 DIAGNOSIS — R202 Paresthesia of skin: Secondary | ICD-10-CM | POA: Diagnosis not present

## 2017-08-28 DIAGNOSIS — G8929 Other chronic pain: Secondary | ICD-10-CM | POA: Diagnosis not present

## 2017-08-29 ENCOUNTER — Telehealth: Payer: Self-pay | Admitting: Neurology

## 2017-08-29 DIAGNOSIS — D225 Melanocytic nevi of trunk: Secondary | ICD-10-CM | POA: Diagnosis not present

## 2017-08-29 DIAGNOSIS — L821 Other seborrheic keratosis: Secondary | ICD-10-CM | POA: Diagnosis not present

## 2017-08-29 DIAGNOSIS — D1801 Hemangioma of skin and subcutaneous tissue: Secondary | ICD-10-CM | POA: Diagnosis not present

## 2017-08-29 NOTE — Telephone Encounter (Signed)
Pt is asking for a call back re: 1. She would like to know how to go about getting the Ajovi shot, pt states Dr Jaynee Eagles mentioned something about being able to get it free for 12 months. 2. Pt states that Dr Jaynee Eagles took her off of her blood pressure medication, since then pt states her blood pressure has gone way up and she would like to know if Dr Jaynee Eagles can give her another refill for it. 3 Pt would like to know if Dr Jaynee Eagles has received the referral from [facility] who is wanting her tested for MS.  Please call

## 2017-08-30 NOTE — Telephone Encounter (Signed)
1. pleae prescribe ajovy fir her 2. I used candesartan for her migraines, it just happened to be a blood pressure medication. I don;t remember her having high blood pressure, I would recommend keeping a diary of her BP 3x a day for a week and then seeing pcp for this 3. She has had MRIs of the brain in the past without any mention of MS lesions, it would have been seen in the past on MRIs last was 01/2017, that is how we test for MS. If she is having new symptoms we need to set her up with [facility] Sauer or an NP to justify another MRI of the brain.

## 2017-08-30 NOTE — Telephone Encounter (Signed)
Called and LVM and office number asking for call back. Due to length, I did not go into details.

## 2017-08-31 ENCOUNTER — Telehealth: Payer: Self-pay | Admitting: *Deleted

## 2017-08-31 MED ORDER — FREMANEZUMAB-VFRM 225 MG/1.5ML ~~LOC~~ SOSY: 1.0000 | PREFILLED_SYRINGE | SUBCUTANEOUS | 11 refills | Status: DC

## 2017-08-31 NOTE — Telephone Encounter (Signed)
I called and spoke to [facility] ([facility]  They did not receive the information relating (for each pharmacy -- steps) that they may need to do for it to go thru.  I faxed over this plus savings card. Fax confirmation received.  Will call tomorrow and check on this.

## 2017-08-31 NOTE — Telephone Encounter (Addendum)
Patient called back, she verbalized understanding of all of the information given by Dr. Jaynee Eagles. Ajovy ordered and printed, ready for MD signature. Patient stated that she is having symptoms that her orthopedic doctor feels is neurological. She states she has numbness in hands & arms, shaking in her hands when she is trying to pick things up, and generalized joint pain. She does feel she needs to be seen. I made her an appointment with [facility]oyle Sauer for this coming Tuesday December 4th @ 3:15 with an arrival time of 2:45. Patient also verbalized understanding that if she has any symptoms resembling stroke (ex. include unilateral weakness/numbness, facial droop, speech changes, severe headache, etc) she should call 911. Will forward this to Dr. Jaynee Eagles for her review.

## 2017-08-31 NOTE — Addendum Note (Signed)
Addended by: Gildardo Griffes on: 08/31/2017 11:06 AM   Modules accepted: Orders

## 2017-08-31 NOTE — Telephone Encounter (Signed)
Prescription faxed to pt's pharmacy. Received a receipt of confirmation.

## 2017-09-05 ENCOUNTER — Ambulatory Visit: Payer: Self-pay | Admitting: Nurse Practitioner

## 2017-09-06 DIAGNOSIS — K760 Fatty (change of) liver, not elsewhere classified: Secondary | ICD-10-CM | POA: Diagnosis not present

## 2017-09-06 DIAGNOSIS — Z9889 Other specified postprocedural states: Secondary | ICD-10-CM | POA: Diagnosis not present

## 2017-09-06 DIAGNOSIS — R1032 Left lower quadrant pain: Secondary | ICD-10-CM | POA: Diagnosis not present

## 2017-09-06 DIAGNOSIS — R7982 Elevated C-reactive protein (CRP): Secondary | ICD-10-CM | POA: Diagnosis not present

## 2017-09-06 DIAGNOSIS — R197 Diarrhea, unspecified: Secondary | ICD-10-CM | POA: Diagnosis not present

## 2017-09-06 DIAGNOSIS — Z9049 Acquired absence of other specified parts of digestive tract: Secondary | ICD-10-CM | POA: Diagnosis not present

## 2017-09-06 DIAGNOSIS — R1031 Right lower quadrant pain: Secondary | ICD-10-CM | POA: Diagnosis not present

## 2017-09-06 NOTE — Telephone Encounter (Signed)
[facility] and [facility] [id number]. Pt picked up the medication yesterday.

## 2017-09-19 ENCOUNTER — Telehealth: Payer: Self-pay | Admitting: Neurology

## 2017-09-19 NOTE — Telephone Encounter (Signed)
Pt is wanting to schedule botox appt

## 2017-09-20 NOTE — Telephone Encounter (Signed)
Patient already had a botox apt scheduled, we made it at her last apt. I called to inform the patient of the time and day.

## 2017-10-03 HISTORY — PX: KNEE SURGERY: SHX244

## 2017-10-11 DIAGNOSIS — Z79899 Other long term (current) drug therapy: Secondary | ICD-10-CM | POA: Diagnosis not present

## 2017-10-11 DIAGNOSIS — K6289 Other specified diseases of anus and rectum: Secondary | ICD-10-CM | POA: Diagnosis not present

## 2017-10-11 DIAGNOSIS — K219 Gastro-esophageal reflux disease without esophagitis: Secondary | ICD-10-CM | POA: Diagnosis not present

## 2017-10-11 DIAGNOSIS — F418 Other specified anxiety disorders: Secondary | ICD-10-CM | POA: Diagnosis not present

## 2017-10-11 DIAGNOSIS — R1030 Lower abdominal pain, unspecified: Secondary | ICD-10-CM | POA: Diagnosis not present

## 2017-10-11 DIAGNOSIS — G43909 Migraine, unspecified, not intractable, without status migrainosus: Secondary | ICD-10-CM | POA: Diagnosis not present

## 2017-10-11 DIAGNOSIS — R7982 Elevated C-reactive protein (CRP): Secondary | ICD-10-CM | POA: Diagnosis not present

## 2017-10-11 DIAGNOSIS — Z9049 Acquired absence of other specified parts of digestive tract: Secondary | ICD-10-CM | POA: Diagnosis not present

## 2017-10-11 DIAGNOSIS — R933 Abnormal findings on diagnostic imaging of other parts of digestive tract: Secondary | ICD-10-CM | POA: Diagnosis not present

## 2017-10-11 DIAGNOSIS — K921 Melena: Secondary | ICD-10-CM | POA: Diagnosis not present

## 2017-10-11 DIAGNOSIS — R197 Diarrhea, unspecified: Secondary | ICD-10-CM | POA: Diagnosis not present

## 2017-10-11 DIAGNOSIS — R109 Unspecified abdominal pain: Secondary | ICD-10-CM | POA: Diagnosis not present

## 2017-10-11 DIAGNOSIS — K228 Other specified diseases of esophagus: Secondary | ICD-10-CM | POA: Diagnosis not present

## 2017-10-11 HISTORY — PX: ESOPHAGOGASTRODUODENOSCOPY ENDOSCOPY: SHX5814

## 2017-10-14 DIAGNOSIS — M2242 Chondromalacia patellae, left knee: Secondary | ICD-10-CM | POA: Diagnosis not present

## 2017-10-14 DIAGNOSIS — M25561 Pain in right knee: Secondary | ICD-10-CM | POA: Diagnosis not present

## 2017-10-14 DIAGNOSIS — M25562 Pain in left knee: Secondary | ICD-10-CM | POA: Diagnosis not present

## 2017-10-14 DIAGNOSIS — M2241 Chondromalacia patellae, right knee: Secondary | ICD-10-CM | POA: Diagnosis not present

## 2017-10-16 DIAGNOSIS — R0609 Other forms of dyspnea: Secondary | ICD-10-CM | POA: Diagnosis not present

## 2017-10-16 DIAGNOSIS — Z6833 Body mass index (BMI) 33.0-33.9, adult: Secondary | ICD-10-CM | POA: Diagnosis not present

## 2017-10-25 DIAGNOSIS — M25562 Pain in left knee: Secondary | ICD-10-CM | POA: Diagnosis not present

## 2017-10-25 DIAGNOSIS — M25561 Pain in right knee: Secondary | ICD-10-CM | POA: Diagnosis not present

## 2017-10-27 DIAGNOSIS — R748 Abnormal levels of other serum enzymes: Secondary | ICD-10-CM | POA: Diagnosis not present

## 2017-10-27 DIAGNOSIS — E2749 Other adrenocortical insufficiency: Secondary | ICD-10-CM | POA: Diagnosis not present

## 2017-10-27 DIAGNOSIS — K760 Fatty (change of) liver, not elsewhere classified: Secondary | ICD-10-CM | POA: Insufficient documentation

## 2017-10-27 DIAGNOSIS — R7989 Other specified abnormal findings of blood chemistry: Secondary | ICD-10-CM | POA: Insufficient documentation

## 2017-10-30 ENCOUNTER — Ambulatory Visit: Payer: BLUE CROSS/BLUE SHIELD | Admitting: Neurology

## 2017-10-30 ENCOUNTER — Encounter: Payer: Self-pay | Admitting: Neurology

## 2017-10-30 VITALS — BP 110/74 | HR 85 | Ht 72.0 in | Wt 257.0 lb

## 2017-10-30 DIAGNOSIS — R29898 Other symptoms and signs involving the musculoskeletal system: Secondary | ICD-10-CM | POA: Diagnosis not present

## 2017-10-30 DIAGNOSIS — G35 Multiple sclerosis: Secondary | ICD-10-CM | POA: Diagnosis not present

## 2017-10-30 DIAGNOSIS — R200 Anesthesia of skin: Secondary | ICD-10-CM

## 2017-10-30 DIAGNOSIS — R202 Paresthesia of skin: Secondary | ICD-10-CM | POA: Diagnosis not present
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
## 2017-10-30 NOTE — Patient Instructions (Signed)
MRI brain F/u 3 months with carolyn martin to check in Follow up with primary care for joint pain

## 2017-11-01 ENCOUNTER — Other Ambulatory Visit: Payer: BLUE CROSS/BLUE SHIELD

## 2017-11-03 DIAGNOSIS — E781 Pure hyperglyceridemia: Secondary | ICD-10-CM | POA: Diagnosis not present

## 2017-11-03 DIAGNOSIS — Z5181 Encounter for therapeutic drug level monitoring: Secondary | ICD-10-CM | POA: Diagnosis not present

## 2017-11-03 DIAGNOSIS — Z79899 Other long term (current) drug therapy: Secondary | ICD-10-CM | POA: Diagnosis not present

## 2017-11-03 DIAGNOSIS — R112 Nausea with vomiting, unspecified: Secondary | ICD-10-CM | POA: Diagnosis not present

## 2017-11-03 DIAGNOSIS — R946 Abnormal results of thyroid function studies: Secondary | ICD-10-CM | POA: Diagnosis not present

## 2017-11-03 DIAGNOSIS — Z9049 Acquired absence of other specified parts of digestive tract: Secondary | ICD-10-CM | POA: Diagnosis not present

## 2017-11-03 DIAGNOSIS — E2740 Unspecified adrenocortical insufficiency: Secondary | ICD-10-CM | POA: Diagnosis not present

## 2017-11-03 DIAGNOSIS — R197 Diarrhea, unspecified: Secondary | ICD-10-CM | POA: Diagnosis not present

## 2017-11-03 DIAGNOSIS — R5383 Other fatigue: Secondary | ICD-10-CM | POA: Diagnosis not present

## 2017-11-03 DIAGNOSIS — Z833 Family history of diabetes mellitus: Secondary | ICD-10-CM | POA: Diagnosis not present

## 2017-11-03 DIAGNOSIS — R7303 Prediabetes: Secondary | ICD-10-CM | POA: Diagnosis not present

## 2017-11-03 DIAGNOSIS — R5382 Chronic fatigue, unspecified: Secondary | ICD-10-CM | POA: Diagnosis not present

## 2017-11-06 DIAGNOSIS — G43719 Chronic migraine without aura, intractable, without status migrainosus: Secondary | ICD-10-CM | POA: Diagnosis not present

## 2017-11-06 DIAGNOSIS — M25561 Pain in right knee: Secondary | ICD-10-CM | POA: Insufficient documentation

## 2017-11-06 DIAGNOSIS — R52 Pain, unspecified: Secondary | ICD-10-CM | POA: Diagnosis not present

## 2017-11-06 DIAGNOSIS — M25562 Pain in left knee: Secondary | ICD-10-CM | POA: Insufficient documentation

## 2017-11-06 DIAGNOSIS — G43839 Menstrual migraine, intractable, without status migrainosus: Secondary | ICD-10-CM | POA: Diagnosis not present

## 2017-11-07 ENCOUNTER — Ambulatory Visit (INDEPENDENT_AMBULATORY_CARE_PROVIDER_SITE_OTHER): Payer: BLUE CROSS/BLUE SHIELD

## 2017-11-07 DIAGNOSIS — F329 Major depressive disorder, single episode, unspecified: Secondary | ICD-10-CM | POA: Diagnosis not present

## 2017-11-07 DIAGNOSIS — R202 Paresthesia of skin: Secondary | ICD-10-CM

## 2017-11-07 DIAGNOSIS — Z9049 Acquired absence of other specified parts of digestive tract: Secondary | ICD-10-CM | POA: Diagnosis not present

## 2017-11-07 DIAGNOSIS — G35 Multiple sclerosis: Secondary | ICD-10-CM | POA: Diagnosis not present

## 2017-11-07 DIAGNOSIS — G8929 Other chronic pain: Secondary | ICD-10-CM | POA: Diagnosis not present

## 2017-11-07 DIAGNOSIS — F419 Anxiety disorder, unspecified: Secondary | ICD-10-CM | POA: Diagnosis not present

## 2017-11-07 DIAGNOSIS — R200 Anesthesia of skin: Secondary | ICD-10-CM

## 2017-11-07 DIAGNOSIS — R5383 Other fatigue: Secondary | ICD-10-CM | POA: Diagnosis not present

## 2017-11-07 DIAGNOSIS — R29898 Other symptoms and signs involving the musculoskeletal system: Secondary | ICD-10-CM | POA: Diagnosis not present

## 2017-11-07 DIAGNOSIS — Z9889 Other specified postprocedural states: Secondary | ICD-10-CM | POA: Diagnosis not present

## 2017-11-07 DIAGNOSIS — E079 Disorder of thyroid, unspecified: Secondary | ICD-10-CM | POA: Diagnosis not present

## 2017-11-08 ENCOUNTER — Telehealth: Payer: Self-pay | Admitting: Neurology

## 2017-11-08 MED ORDER — GADOPENTETATE DIMEGLUMINE 469.01 MG/ML IV SOLN: 20.0000 mL | Freq: Once | INTRAVENOUS | Status: DC | PRN

## 2017-11-08 NOTE — Telephone Encounter (Signed)
Spoke with patient. She stated that her Botox was not approved and was under the impression that other medications needed to changed to "non formulary". I explained that non-formulary means they will not be covered. She verbalized understanding and said that Ajovy is no longer covered & Candesartan. Candesartan was d/c on 08/09/17 and I told the patient. She stated she has been taking it and has refills left. I informed patient that information from an Ajovy savings card was already sent to her pharmacy and that I would call them to make sure it can be applied as it will allow her to get it free for a year. She verbalized appreciation and will call her insurance company to find out more information.   Spoke with Andee Poles for clarification. Botox was not approved d/t Botox not being a formulary drug.   Called [facility] pharmacy. RN told that Ajovy was ready for pt pickup with a $0 copay and understood that card information was sent. They will send a text to the patient informing her that it's ready.

## 2017-11-08 NOTE — Telephone Encounter (Signed)
Patient says her insurance has changed and the pharmacy is telling her she has to have all of her rx's written as "non formulary" now. Is that something we can do Denise Avila?

## 2017-11-09 ENCOUNTER — Telehealth: Payer: Self-pay | Admitting: *Deleted

## 2017-11-09 MED ORDER — PROPRANOLOL HCL 10 MG PO TABS: 10.0000 mg | ORAL_TABLET | Freq: Two times a day (BID) | ORAL | 5 refills | Status: DC

## 2017-11-09 NOTE — Telephone Encounter (Signed)
Pt returned RN's call °

## 2017-11-09 NOTE — Telephone Encounter (Signed)
Called patient and LVM asking for call back to discuss medication changes.

## 2017-11-09 NOTE — Addendum Note (Signed)
Addended by: Gildardo Griffes on: 11/09/2017 04:26 PM   Modules accepted: Orders

## 2017-11-09 NOTE — Telephone Encounter (Addendum)
Spoke with patient. Discussed that Dr. Jaynee Eagles has stated for patient to stop taking Candesartan and start propranolol 10 mg PO BID. Advised patient to watch out for low blood pressure and low pulse. If she feels dizzy or has any side effects at all she needs to stop it. If she is ok on that dose have her email Dr Jaynee Eagles and we can increase it to 20mg  twice daily and then 40mg  twice daily. Talked to patient and advised her to watch her blood pressure and stop the candesartan. She verbalized understanding and appreciation.   Propranolol 10 mg PO BID escribed to pharmacy, # 60 tablets, 5 refills.

## 2017-11-09 NOTE — Telephone Encounter (Signed)
LVM informing patient that  Her MRI brain was normal. Left number for any questions.

## 2017-11-09 NOTE — Telephone Encounter (Signed)
Pt returned RN's call. Msg relayed, pt was appreciative, she did not have any questions.

## 2017-11-09 NOTE — Telephone Encounter (Signed)
Let's try propranolol very low dose. We just have to watch out for low blood pressure and low pulse. If she feels dizzy or has any side effects at all she needs to stop it. But she has been on the Candesartan and that is a blood pressure medication so I think we can stop that and start propranolol 10mg  twice daily. If she is ok on that dose have her email me and we can increase it to 20mg  twice daily and then 40mg  twice daily. Have her watch her blood pressure and stop the candesartan. thanks

## 2017-11-10 ENCOUNTER — Ambulatory Visit: Payer: BLUE CROSS/BLUE SHIELD | Admitting: Neurology

## 2017-11-13 ENCOUNTER — Ambulatory Visit: Payer: Self-pay | Admitting: Nurse Practitioner

## 2017-11-14 ENCOUNTER — Telehealth: Payer: Self-pay | Admitting: Neurology

## 2017-11-14 NOTE — Telephone Encounter (Signed)
Pt called [facility] and ok'd the shipment. She was advised by the rep Andee Poles needs to call regarding the shipment. Please call

## 2017-11-15 DIAGNOSIS — G43711 Chronic migraine without aura, intractable, with status migrainosus: Secondary | ICD-10-CM | POA: Diagnosis not present

## 2017-11-15 NOTE — Telephone Encounter (Signed)
I called and scheduled the medication for delivery.

## 2017-11-16 ENCOUNTER — Ambulatory Visit (INDEPENDENT_AMBULATORY_CARE_PROVIDER_SITE_OTHER): Payer: BLUE CROSS/BLUE SHIELD | Admitting: Neurology

## 2017-11-16 ENCOUNTER — Encounter: Payer: Self-pay | Admitting: Neurology

## 2017-11-16 VITALS — BP 108/71 | HR 105

## 2017-11-16 DIAGNOSIS — G43011 Migraine without aura, intractable, with status migrainosus: Secondary | ICD-10-CM

## 2017-11-16 NOTE — Progress Notes (Signed)
Nerve Block w/o Steroid 6 syringes (3 mL each) of 1:1 Bupivocaine & Lidocaine   0.5% Bupivocaine 9 mL total LOT: [id number] EXP: 01/01/2018 NDC: 4010-2725-36  Lidocaine 2% 9 mL total LOT: [id number] EXP: 06/04/2019

## 2017-11-17 ENCOUNTER — Telehealth: Payer: Self-pay | Admitting: Neurology

## 2017-11-17 DIAGNOSIS — M5030 Other cervical disc degeneration, unspecified cervical region: Secondary | ICD-10-CM | POA: Insufficient documentation

## 2017-11-17 DIAGNOSIS — G8929 Other chronic pain: Secondary | ICD-10-CM | POA: Insufficient documentation

## 2017-11-17 DIAGNOSIS — G43711 Chronic migraine without aura, intractable, with status migrainosus: Secondary | ICD-10-CM | POA: Diagnosis not present

## 2017-11-17 DIAGNOSIS — M542 Cervicalgia: Secondary | ICD-10-CM | POA: Diagnosis not present

## 2017-11-17 NOTE — Telephone Encounter (Signed)
Spoke with patient. She will come for an infusion of Toradol, Depacon, Solumedrol, and Compazine per Dr. Cathren Laine order. Pt will have a driver and will be here about 11:15. Confirmed allergies with patient. Order form completed and insurance card copy printed. Both taken to [place] in infusion.

## 2017-11-17 NOTE — Telephone Encounter (Signed)
Pt called she said Dr Jaynee Eagles wanted her to call today to get an infusion on an emergency basis. Please call to advise

## 2017-11-18 NOTE — Progress Notes (Signed)
Patient here with severe intractable migraine for 2 weeks. She has been to the ED. She has been to the emergency room. Discussed treatment today, will not perform botox today but will perform nerve blocks. If no improvement, decided to come back tomorrow for migraine infusion.  A total of 15 minutes was spent in with this patient face-to-face. Over half this time was spent on counseling patient on the intractable migraine diagnosis and different therapeutic options available. Will perform nerve blocks. Time spent not including time needed for nerve blocks.  Performed by Dr. Jaynee Eagles M.D.  All procedures a documented below were medically necessary, reasonable and appropriate based on the patient's history, medical diagnosis and physician opinion. Verbal informed consent was obtained from the patient, patient was informed of potential risk of procedure, including bruising, bleeding, hematoma formation, infection, muscle weakness, muscle pain, numbness, transient hypertension, transient hyperglycemia and transient insomnia among others. All areas injected were topically clean with isopropyl rubbing alcohol. Nonsterile nonlatex gloves were worn during the procedure.  1. Greater occipital nerve block [phone]). The greater occipital nerve site was identified at the nuchal line medial to the occipital artery. Medication was injected into the left and right occipital nerve areas and suboccipital areas. Patient's condition is associated with inflammation of the greater occipital nerve and associated multiple groups. Injection was deemed medically necessary, reasonable and appropriate. Injection represents a separate and unique surgical service.  2. Lesser occipital nerve block [phone]). The lesser occipital nerve site was identified approximately 2 cm lateral to the greater occipital nerve. Occasion was injected into the left and right occipital nerve areas. Patient's condition is associated with inflammation of the  lesser occipital nerve and associated muscle groups. Injection was deemed medically necessary, reasonable and appropriate. Injection represents a separate and unique surgical service.   3. Auriculotemporal nerve block (09811): The Auriculotemporal nerve site was identified along the posterior margin of the sternocleidomastoid muscle toward the base of the ear. Medication was injected into the left and right radicular temporal nerve areas. Patient's condition is associated with inflammation of the Auriculotemporal Nerve and associated muscle groups. Injection was deemed medically necessary, reasonable and appropriate. Injection represents a separate and unique surgical service.  4. Supraorbital nerve block (64400): Supraorbital nerve site was identified along the incision of the frontal bone on the orbital/supraorbital ridge. Medication was injected into the left and right supraorbital nerve areas. Patient's condition is associated with inflammation of the supraorbital and associated muscle groups. Injection was deemed medically necessary, reasonable and appropriate. Injection represents a separate and unique surgical service.

## 2017-11-21 ENCOUNTER — Telehealth: Payer: Self-pay | Admitting: Neurology

## 2017-11-21 DIAGNOSIS — M25562 Pain in left knee: Secondary | ICD-10-CM | POA: Diagnosis not present

## 2017-11-21 DIAGNOSIS — M25561 Pain in right knee: Secondary | ICD-10-CM | POA: Diagnosis not present

## 2017-11-21 NOTE — Telephone Encounter (Signed)
Error

## 2017-11-23 ENCOUNTER — Ambulatory Visit: Payer: BLUE CROSS/BLUE SHIELD | Admitting: Neurology

## 2017-11-23 ENCOUNTER — Encounter: Payer: Self-pay | Admitting: Neurology

## 2017-11-23 ENCOUNTER — Telehealth: Payer: Self-pay | Admitting: Neurology

## 2017-11-23 VITALS — BP 105/67 | HR 85

## 2017-11-23 DIAGNOSIS — G43711 Chronic migraine without aura, intractable, with status migrainosus: Secondary | ICD-10-CM | POA: Diagnosis not present

## 2017-11-23 NOTE — Progress Notes (Signed)
Consent Form Botulism Toxin Injection For Chronic Migraine  Botulism toxin has been approved by the Federal drug administration for treatment of chronic migraine. Botulism toxin does not cure chronic migraine and it may not be effective in some patients.  The administration of botulism toxin is accomplished by injecting a small amount of toxin into the muscles of the neck and head. Dosage must be titrated for each individual. Any benefits resulting from botulism toxin tend to wear off after 3 months with a repeat injection required if benefit is to be maintained. Injections are usually done every 3-4 months with maximum effect peak achieved by about 2 or 3 weeks. Botulism toxin is expensive and you should be sure of what costs you will incur resulting from the injection.  The side effects of botulism toxin use for chronic migraine may include:   -Transient, and usually mild, facial weakness with facial injections  -Transient, and usually mild, head or neck weakness with head/neck injections  -Reduction or loss of forehead facial animation due to forehead muscle              weakness  -Eyelid drooping  -Dry eye  -Pain at the site of injection or bruising at the site of injection  -Double vision  -Potential unknown long term risks  Contraindications: You should not have Botox if you are pregnant, nursing, allergic to albumin, have an infection, skin condition, or muscle weakness at the site of the injection, or have myasthenia gravis, Lambert-Eaton syndrome, or ALS.  It is also possible that as with any injection, there may be an allergic reaction or no effect from the medication. Reduced effectiveness after repeated injections is sometimes seen and rarely infection at the injection site may occur. All care will be taken to prevent these side effects. If therapy is given over a long time, atrophy and wasting in the muscle injected may occur. Occasionally the patient's become refractory to  treatment because they develop antibodies to the toxin. In this event, therapy needs to be modified.  I have read the above information and consent to the administration of botulism toxin.    ______________  _____   _________________  Patient signature     Date   Witness signature       BOTOX PROCEDURE NOTE FOR MIGRAINE HEADACHE    Contraindications and precautions discussed with patient(above). Aseptic procedure was observed and patient tolerated procedure. Procedure performed by Dr. Georgia Dom  The condition has existed for more than 6 months, and pt does not have a diagnosis of ALS, Myasthenia Gravis or Lambert-Eaton Syndrome. Risks and benefits of injections discussed and pt agrees to proceed with the procedure. Written consent obtained  These injections are medically necessary. He receives good benefits from these injections. These injections do not cause sedations or hallucinations which the oral therapies may cause.  Indication/Diagnosis: chronic migraine BOTOX(J0585) injection was performed according to protocol by [employer]. 200 units of BOTOX was dissolved into 4 cc NS.  NDC: [address]-19  Type of toxin: Botox  Botox- 100 units x 2 vials Lot: [id number] Expiration: 05/2020 NDC: 8921-1941-74   Bacteriostatic 0.9% Sodium Chloride- 59mL total Lot: [id number] Expiration: 02/01/2019 NDC: 1856-3149-70   Dx: Y63.785 S/P   Description of procedure:  The patient was placed in a sitting position. The standard protocol was used for Botox as follows, with 5 units of Botox injected at each site:   -Procerus muscle, midline injection  -Corrugator muscle, bilateral injection  -Frontalis muscle, bilateral injection, with  2 sites each side, medial injection was performed in the upper one third of the frontalis muscle, in the region vertical from the medial inferior edge of the superior orbital rim. The lateral injection was again in the upper one third of the forehead  vertically above the lateral limbus of the cornea, 1.5 cm lateral to the medial injection site.  -Temporalis muscle injection, 4 sites, bilaterally. The first injection was 3 cm above the tragus of the ear, second injection site was 1.5 cm to 3 cm up from the first injection site in line with the tragus of the ear. The third injection site was 1.5-3 cm forward between the first 2 injection sites. The fourth injection site was 1.5 cm posterior to the second injection site.  -Occipitalis muscle injection, 3 sites, bilaterally. The first injection was done one half way between the occipital protuberance and the tip of the mastoid process behind the ear. The second injection site was done lateral and superior to the first, 1 fingerbreadth from the first injection. The third injection site was 1 fingerbreadth superiorly and medially from the first injection site.  -Cervical paraspinal muscle injection, 2 sites, bilateral knee first injection site was 1 cm from the midline of the cervical spine, 3 cm inferior to the lower border of the occipital protuberance. The second injection site was 1.5 cm superiorly and laterally to the first injection site.  -Trapezius muscle injection was performed at 3 sites, bilaterally. The first injection site was in the upper trapezius muscle halfway between the inflection point of the neck, and the acromion. The second injection site was one half way between the acromion and the first injection site. The third injection was done between the first injection site and the inflection point of the neck.   Will return for repeat injection in 3 months.   A 200 unit sof Botox was used, 155 units were injected, the rest of the Botox was wasted. The patient tolerated the procedure well, there were no complications of the above procedure.

## 2017-11-23 NOTE — Telephone Encounter (Signed)
12 wk. Botox follow-up.

## 2017-11-25 ENCOUNTER — Encounter (HOSPITAL_COMMUNITY): Payer: Self-pay

## 2017-11-25 ENCOUNTER — Other Ambulatory Visit: Payer: Self-pay

## 2017-11-25 ENCOUNTER — Emergency Department (HOSPITAL_COMMUNITY): Payer: BLUE CROSS/BLUE SHIELD

## 2017-11-25 ENCOUNTER — Emergency Department (HOSPITAL_COMMUNITY)
Admission: EM | Admit: 2017-11-25 | Discharge: 2017-11-25 | Disposition: A | Discharge status: Home / Self Care | Payer: BLUE CROSS/BLUE SHIELD | Attending: Emergency Medicine | Admitting: Emergency Medicine

## 2017-11-25 DIAGNOSIS — Y999 Unspecified external cause status: Secondary | ICD-10-CM | POA: Diagnosis not present

## 2017-11-25 DIAGNOSIS — G4489 Other headache syndrome: Secondary | ICD-10-CM | POA: Insufficient documentation

## 2017-11-25 DIAGNOSIS — S298XXA Other specified injuries of thorax, initial encounter: Secondary | ICD-10-CM | POA: Insufficient documentation

## 2017-11-25 DIAGNOSIS — R918 Other nonspecific abnormal finding of lung field: Secondary | ICD-10-CM | POA: Diagnosis not present

## 2017-11-25 DIAGNOSIS — Z8542 Personal history of malignant neoplasm of other parts of uterus: Secondary | ICD-10-CM | POA: Insufficient documentation

## 2017-11-25 DIAGNOSIS — Y929 Unspecified place or not applicable: Secondary | ICD-10-CM | POA: Diagnosis not present

## 2017-11-25 DIAGNOSIS — W0110XA Fall on same level from slipping, tripping and stumbling with subsequent striking against unspecified object, initial encounter: Secondary | ICD-10-CM | POA: Diagnosis not present

## 2017-11-25 DIAGNOSIS — Y9301 Activity, walking, marching and hiking: Secondary | ICD-10-CM | POA: Insufficient documentation

## 2017-11-25 DIAGNOSIS — R0789 Other chest pain: Secondary | ICD-10-CM | POA: Diagnosis not present

## 2017-11-25 DIAGNOSIS — Z79899 Other long term (current) drug therapy: Secondary | ICD-10-CM | POA: Diagnosis not present

## 2017-11-25 DIAGNOSIS — S199XXA Unspecified injury of neck, initial encounter: Secondary | ICD-10-CM | POA: Diagnosis not present

## 2017-11-25 DIAGNOSIS — S299XXA Unspecified injury of thorax, initial encounter: Secondary | ICD-10-CM | POA: Diagnosis not present

## 2017-11-25 DIAGNOSIS — S161XXA Strain of muscle, fascia and tendon at neck level, initial encounter: Secondary | ICD-10-CM | POA: Insufficient documentation

## 2017-11-25 DIAGNOSIS — Z85828 Personal history of other malignant neoplasm of skin: Secondary | ICD-10-CM | POA: Diagnosis not present

## 2017-11-25 DIAGNOSIS — M542 Cervicalgia: Secondary | ICD-10-CM | POA: Diagnosis not present

## 2017-11-25 DIAGNOSIS — S8992XA Unspecified injury of left lower leg, initial encounter: Secondary | ICD-10-CM | POA: Diagnosis not present

## 2017-11-25 DIAGNOSIS — R55 Syncope and collapse: Secondary | ICD-10-CM | POA: Insufficient documentation

## 2017-11-25 LAB — BASIC METABOLIC PANEL
Anion gap: 10 (ref 5–15)
BUN: 12 mg/dL (ref 6–20)
CHLORIDE: 107 mmol/L (ref 101–111)
CO2: 21 mmol/L — AB (ref 22–32)
CREATININE: 0.68 mg/dL (ref 0.44–1.00)
Calcium: 8.1 mg/dL — ABNORMAL LOW (ref 8.9–10.3)
GFR calc Af Amer: >60 mL/min (ref >60)
GFR calc non Af Amer: >60 mL/min (ref >60)
Glucose, Bld: 104 mg/dL — ABNORMAL HIGH (ref 65–99)
Potassium: 3.9 mmol/L (ref 3.5–5.1)
Sodium: 138 mmol/L (ref 135–145)

## 2017-11-25 LAB — CBC WITH DIFFERENTIAL/PLATELET
Basophils Absolute: 0 10*3/uL (ref 0.0–0.1)
Basophils Relative: 0 %
EOS ABS: 0.2 10*3/uL (ref 0.0–0.7)
EOS PCT: 3 %
HCT: 30.3 % — ABNORMAL LOW (ref 36.0–46.0)
HEMOGLOBIN: 9.1 g/dL — AB (ref 12.0–15.0)
LYMPHS ABS: 1.8 10*3/uL (ref 0.7–4.0)
Lymphocytes Relative: 35 %
MCH: 23.5 pg — AB (ref 26.0–34.0)
MCHC: 30 g/dL (ref 30.0–36.0)
MCV: 78.3 fL (ref 78.0–100.0)
Monocytes Absolute: 0.3 10*3/uL (ref 0.1–1.0)
Monocytes Relative: 7 %
Neutro Abs: 2.8 10*3/uL (ref 1.7–7.7)
Neutrophils Relative %: 55 %
PLATELETS: 203 10*3/uL (ref 150–400)
RBC: 3.87 MIL/uL (ref 3.87–5.11)
RDW: 16 % — ABNORMAL HIGH (ref 11.5–15.5)
WBC: 5.1 10*3/uL (ref 4.0–10.5)

## 2017-11-25 LAB — RAPID URINE DRUG SCREEN, HOSP PERFORMED
AMPHETAMINES: NOT DETECTED
BARBITURATES: NOT DETECTED
Benzodiazepines: NOT DETECTED
COCAINE: NOT DETECTED
OPIATES: NOT DETECTED
TETRAHYDROCANNABINOL: NOT DETECTED

## 2017-11-25 LAB — TYPE AND SCREEN
ABO/RH(D): A NEG
Antibody Screen: NEGATIVE

## 2017-11-25 LAB — I-STAT TROPONIN, ED: TROPONIN I, POC: 0 ng/mL (ref 0.00–0.08)

## 2017-11-25 LAB — ETHANOL: Alcohol, Ethyl (B): <10 mg/dL (ref <10)

## 2017-11-25 LAB — POC OCCULT BLOOD, ED: Fecal Occult Bld: NEGATIVE

## 2017-11-25 MED ORDER — SODIUM CHLORIDE 0.9 % IV BOLUS (SEPSIS)
1000.0000 mL | Freq: Once | INTRAVENOUS | Status: AC
  Administered 2017-11-25: 1000 mL via INTRAVENOUS

## 2017-11-25 MED ORDER — DIPHENHYDRAMINE HCL 50 MG/ML IJ SOLN
12.5000 mg | Freq: Once | INTRAMUSCULAR | Status: AC
  Administered 2017-11-25: 12.5 mg via INTRAVENOUS
  Filled 2017-11-25: qty 1

## 2017-11-25 MED ORDER — KETOROLAC TROMETHAMINE 15 MG/ML IJ SOLN
15.0000 mg | Freq: Once | INTRAMUSCULAR | Status: AC
  Administered 2017-11-25: 15 mg via INTRAVENOUS
  Filled 2017-11-25: qty 1

## 2017-11-25 MED ORDER — METOCLOPRAMIDE HCL 5 MG/ML IJ SOLN
10.0000 mg | Freq: Once | INTRAMUSCULAR | Status: AC
  Administered 2017-11-25: 10 mg via INTRAVENOUS
  Filled 2017-11-25: qty 2

## 2017-11-25 NOTE — Discharge Instructions (Signed)
As we discussed, please cut back on her Flexeril usage Be sure to follow-up with your neurologist next week

## 2017-11-25 NOTE — ED Triage Notes (Signed)
Patient coming from home with c/o LOC . Per patient  Mother patient  is on new medication for multiple sclerosis and since then she been sleeping a lot and this morning she was unable to arouse her. Pt c/o neck pain. BP100/64 P-64, cbg 128 per ems. Upon arrival to ed pt was able to transfer self to ed bed with no assist and able to answer question.

## 2017-11-25 NOTE — ED Notes (Signed)
Patient ambulated to bathroom still complaining of dizziness

## 2017-11-25 NOTE — ED Provider Notes (Signed)
[facility] DEPT Provider Note   CSN: [id number] Arrival date & time: 11/25/17  0258     History   Chief Complaint Chief Complaint -loss of consciousness  HPI Denise Avila is a 42 y.o. female.  The history is provided by the patient and the EMS personnel.  Loss of Consciousness   This is a new problem. Episode onset: Just prior to arrival. The problem has been gradually improving. Length of episode of loss of consciousness: Unknown. Associated symptoms include chest pain and nausea. Pertinent negatives include fever and seizures. Treatments tried: Fluids. The treatment provided mild relief.  Patient with history of chronic headaches presents after syncopal episode.  Reports this morning she was up to walk her dog and she had a syncopal event.  She fell from standing.  She has neck pain.  She reports generalized weakness.  She reports she always has chest pain, this is not new.  She also states she has chronic diarrhea, this is not new due to previous colectomy.  She also reports recent bloody stool.  She reports during the fall, she may have injured her left knee. She is a history of chronic migraines/headaches and is seen by neurology, and new medications were started recently.  Past Medical History:  Diagnosis Date  . Abnormal Pap smear of cervix 12/2014  . Arthritis   . Depression   . Endometriosis   . Fibroids    age 61  . History of kidney stones   . Liver disease    per pt report, will have biopsy Nov 08, 2017  . Migraines   . Pleurisy 02/2015   while hospitalized   . Skin cancer (melanoma) ([place])   . Syncope and collapse   . Uterine cancer [facility])     Patient Active Problem List   Diagnosis Date Noted  . Intractable chronic migraine without aura with status migrainosus 04/25/2017  . Vaginal cuff dehiscence 08/21/2015  . Vaginal bleeding   . Surgical menopause on hormone replacement therapy 06/22/2015  . Endometriosis of ovary  05/21/2015  . Abnormal uterine bleeding   . Endometriosis   . Pyelonephritis 02/02/2015  . Nephrolithiasis 02/02/2015  . Hydronephrosis 02/02/2015  . Left ureteral stone     Past Surgical History:  Procedure Laterality Date  . ABLATION     uterine  . COLECTOMY    . CYSTOSCOPY W/ URETERAL STENT PLACEMENT Left 02/02/2015   Procedure: CYSTOSCOPY WITH RETROGRADE PYELOGRAM/URETERAL STENT PLACEMENT;  Surgeon: Festus Aloe, MD;  Location: [facility] ORS;  Service: Urology;  Laterality: Left;  . CYSTOSCOPY WITH URETEROSCOPY AND STENT PLACEMENT Left 02/13/2015   Procedure: LEFT URETEROSCOPY WITH HOLMIUM LASER AND STENT PLACEMENT;  Surgeon: Festus Aloe, MD;  Location: [facility] OR[facility];  Service: Urology;  Laterality: Left;  . DILATION AND CURETTAGE OF UTERUS    . ESOPHAGOGASTRODUODENOSCOPY ENDOSCOPY  10/11/2017   @ [facility]; BIOPSY OF LOWER INTESTINE  . HOLMIUM LASER APPLICATION N/A 02/27/7823   Procedure: HOLMIUM LASER APPLICATION;  Surgeon: Festus Aloe, MD;  Location: [facility];  Service: Urology;  Laterality: N/A;  . LAPAROSCOPY     x 4  . MELANOMA EXCISION  2000   melanoma removed from face, basal cell removed from nose  . REPAIR VAGINAL CUFF N/A 08/21/2015   Procedure: REPAIR VAGINAL CUFF, EXAM UNDER ANESTHESIA;  Surgeon: Everitt Amber, MD;  Location: [facility] OR[facility];  Service: Gynecology;  Laterality: N/A;  . ROBOTIC ASSISTED TOTAL HYSTERECTOMY WITH BILATERAL SALPINGO OOPHERECTOMY Bilateral 05/21/2015   Procedure: XI ROBOTIC ASSISTED  TOTAL HYSTERECTOMY WITH BILATERAL SALPINGO OOPHORECTOMY;  Surgeon: Everitt Amber, MD;  Location: [facility] OR[facility];  Service: Gynecology;  Laterality: Bilateral;    OB History    No data available       Home Medications    Prior to Admission medications   Medication Sig Start Date End Date Taking? Authorizing Provider  beta carotene w/minerals (OCUVITE) tablet Take 1 tablet by mouth daily.    [provider]  buPROPion (WELLBUTRIN XL) 150 MG 24 hr tablet Take 150 mg by mouth  every morning. 02/18/17   [provider]  citalopram (CELEXA) 20 MG tablet Take 30 mg by mouth at bedtime.     [provider]  clonazePAM (KLONOPIN) 1 MG tablet Take 1 mg by mouth 4 (four) times daily.  04/03/17   [provider]  cyclobenzaprine (FLEXERIL) 10 MG tablet TAKE 1 TABLET BY MOUTH TWICE A DAY 06/07/17   Melvenia Beam, MD  diphenhydrAMINE (BENADRYL) 25 MG tablet Take 100 mg by mouth every 6 (six) hours as needed for allergies. For headache 07/27/16   [provider]  diphenoxylate-atropine (LOMOTIL) 2.5-0.025 MG tablet Take 4 tablets by mouth every 12 (twelve) hours as needed for diarrhea or loose stools.  04/10/17   [provider]  estrogens, conjugated, (PREMARIN) 0.9 MG tablet Take 0.9 mg by mouth daily. Take daily for 21 days then do not take for 7 days.    [provider]  Fremanezumab-vfrm (AJOVY) 225 MG/1.5ML SOSY Inject 1 Syringe into the skin every 30 (thirty) days. 08/31/17   Melvenia Beam, MD  LamoTRIgine 50 MG TB24 Take 200 mg by mouth every evening.  03/02/17   [provider]  meloxicam (MOBIC) 7.5 MG tablet Take 7.5 mg by mouth 2 (two) times daily. 04/21/17 04/21/18  [provider]  naproxen (NAPROSYN) 500 MG tablet Take 500 mg by mouth 2 (two) times daily as needed.    [provider]  NONFORMULARY OR COMPOUNDED ITEM Inject 1 application every 30 (thirty) days into the skin. 08/10/17   Melvenia Beam, MD  nystatin (MYCOSTATIN) 100000 UNIT/ML suspension Take 5 mLs by mouth 4 (four) times daily. 02/09/17   [provider]  OLANZapine (ZYPREXA) 5 MG tablet Take 1 tablet (5 mg total) by mouth daily. 06/12/17 06/25/17  Melvenia Beam, MD  omeprazole (PRILOSEC) 20 MG capsule Take 20 mg by mouth daily.    [provider]  ondansetron (ZOFRAN) 4 MG tablet Take 1 tablet (4 mg total) by mouth every 6 (six) hours as needed for nausea. Patient taking differently: Take 4 mg by mouth every  6 (six) hours.  05/22/15   Everitt Amber, MD  promethazine (PHENERGAN) 12.5 MG tablet Take 12.5 mg by mouth 2 (two) times daily.    [provider]  propranolol (INDERAL) 10 MG tablet Take 1 tablet (10 mg total) by mouth 2 (two) times daily. 11/09/17   Melvenia Beam, MD  sucralfate (CARAFATE) 1 G tablet Take 1 tablet (1 g total) by mouth 4 (four) times daily. Patient taking differently: Take 2 g by mouth 2 (two) times daily.  02/24/15   Lacretia Leigh, MD  SUMAtriptan (IMITREX) 100 MG tablet Take 1 tablet (100 mg total) by mouth once as needed for migraine. May repeat in 2 hours if headache persists or recurs. Max: 2 tabs/day 04/26/17   Melvenia Beam, MD  tiZANidine (ZANAFLEX) 4 MG tablet Take 12 mg by mouth at bedtime.  03/01/17   [provider]  traZODone (DESYREL) 100 MG tablet Take 200 mg by mouth at bedtime.  09/07/15   [provider]    Family History Family History  Problem Relation Age of Onset  . Hypertension Mother   . Melanoma Mother   . Cancer - Other Father        liver  . Diabetes Mellitus II Maternal Grandmother   . Heart disease Maternal Grandmother   . Breast cancer Maternal Grandmother   . Cancer - Lung Maternal Grandfather   . Osteoporosis Paternal Grandmother   . Melanoma Paternal Grandfather     Social History Social History   Tobacco Use  . Smoking status: Never Smoker  . Smokeless tobacco: Never Used  Substance Use Topics  . Alcohol use: No  . Drug use: No     Allergies   Penicillins; Amoxicillin; and Ciprofloxacin   Review of Systems Review of Systems  Constitutional: Negative for fever.  Cardiovascular: Positive for chest pain and syncope.  Gastrointestinal: Positive for blood in stool and nausea.  Genitourinary: Negative for vaginal bleeding.  Neurological: Positive for syncope. Negative for seizures.  All other systems reviewed and are negative.    Physical Exam Updated Vital Signs BP (!) 111/56 (BP Location:  Left Arm)   Pulse 61   Temp (!) 97.3 F (36.3 C) (Oral)   Resp 16   LMP 05/04/2015 (Approximate)   SpO2 98%   Physical Exam CONSTITUTIONAL: Somnolent HEAD: Normocephalic/atraumatic EYES: EOMI/PERRL ENMT: Mucous membranes dry, no tongue laceration NECK: Towel wrapped around neck SPINE/BACK: Cervical spine tenderness, no bruising/crepitance/stepoffs noted to spine CV: S1/S2 noted, no murmurs/rubs/gallops noted LUNGS: Lungs are clear to auscultation bilaterally, no apparent distress ABDOMEN: soft, nontender GU:no cva tenderness Rectal - no blood/melena, no masses noted, nurse chaperone present NEURO: Pt is somnolent but arousable.  No facial droop.  No arm/leg drift.   EXTREMITIES: pulses normal/equal, full ROM, mild tenderness to left knee, pelvis stable, All other extremities/joints palpated/ranged and nontender SKIN: warm, color normal PSYCH:somnolent    ED Treatments / Results  Labs (all labs ordered are listed, but only abnormal results are displayed) Labs Reviewed  BASIC METABOLIC PANEL - Abnormal; Notable for the following components:      Result Value   CO2 21 (*)    Glucose, Bld 104 (*)    Calcium 8.1 (*)    All other components within normal limits  CBC WITH DIFFERENTIAL/PLATELET - Abnormal; Notable for the following components:   Hemoglobin 9.1 (*)    HCT 30.3 (*)    MCH 23.5 (*)    RDW 16.0 (*)    All other components within normal limits  ETHANOL  RAPID URINE DRUG SCREEN, HOSP PERFORMED  I-STAT TROPONIN, ED  POC OCCULT BLOOD, ED  TYPE AND SCREEN    EKG  EKG Interpretation  Date/Time:  Saturday November 25 2017 08:32:25 EST Ventricular Rate:  55 PR Interval:    QRS Duration: 105 QT Interval:  469 QTC Calculation: 449 R Axis:   20 Text Interpretation:  Sinus rhythm Low voltage, precordial leads Confirmed by Ripley Fraise [phone]) on 11/25/2017 8:51:37 AM       Radiology Dg Chest 1 View  Result Date: 11/25/2017 CLINICAL DATA:  Syncopal  episode this morning. EXAM: CHEST 1 VIEW COMPARISON:  06/25/2017; 04/21/2017; chest CT-02/24/2015 FINDINGS: Grossly unchanged borderline enlarged cardiac silhouette. Normal mediastinal contours. Veiling opacities overlying the bilateral lower lungs are favored to overlying breast tissues. No discrete focal airspace opacities. No pleural effusion or  pneumothorax. No evidence of edema. No acute osseus abnormalities. IMPRESSION: Borderline cardiomegaly without superimposed acute cardiopulmonary disease. Electronically Signed   By: Sandi Mariscal M.D.   On: 11/25/2017 09:57   Ct Cervical Spine Wo Contrast  Result Date: 11/25/2017 CLINICAL DATA:  Pain after fall EXAM: CT CERVICAL SPINE WITHOUT CONTRAST TECHNIQUE: Multidetector CT imaging of the cervical spine was performed without intravenous contrast. Multiplanar CT image reconstructions were also generated. COMPARISON:  June 25, 2017 FINDINGS: Alignment: Normal. Skull base and vertebrae: No acute fracture. No primary bone lesion or focal pathologic process. Soft tissues and spinal canal: Air seen in vessels, including in the upper cervical spine, probably from recent IV insertion. No other soft tissue abnormalities. Disc levels:  No significant degenerative changes. Upper chest: Negative. Other: No other abnormalities. IMPRESSION: No acute fracture or traumatic malalignment. Electronically Signed   By: Dorise Bullion III M.D   On: 11/25/2017 09:03   Dg Knee Complete 4 Views Left  Result Date: 11/25/2017 CLINICAL DATA:  Syncopal episode this morning, post fall injuring left knee. EXAM: LEFT KNEE - COMPLETE 4+ VIEW COMPARISON:  Left knee radiographs-12/02/2016 FINDINGS: No fracture or dislocation. Suspected mild degenerative change of the patellar femoral joint with articular surface irregularity, subchondral sclerosis and osteophytosis. No evidence of chondrocalcinosis. No joint effusion. Regional soft tissues appear normal. IMPRESSION: 1. No acute findings.  2. Degenerative change of the patellofemoral joint, similar to the 12/2016 examination. Electronically Signed   By: Sandi Mariscal M.D.   On: 11/25/2017 09:56    Procedures Procedures (including critical care time)  Medications Ordered in ED Medications  sodium chloride 0.9 % bolus 1,000 mL (0 mLs Intravenous Stopped 11/25/17 1150)  metoCLOPramide (REGLAN) injection 10 mg (10 mg Intravenous Given 11/25/17 1150)  diphenhydrAMINE (BENADRYL) injection 12.5 mg (12.5 mg Intravenous Given 11/25/17 1150)  ketorolac (TORADOL) 15 MG/ML injection 15 mg (15 mg Intravenous Given 11/25/17 1150)     Initial Impression / Assessment and Plan / ED Course  I have reviewed the triage vital signs and the nursing notes.  Pertinent labs & imaging results that were available during my care of the patient were reviewed by me and considered in my medical decision making (see chart for details).     8:52 AM Patient with syncopal episode at home.  She is somnolent but arousable.  No focal neuro deficits.  No signs of head trauma.  She does have worsening neck pain will get CT imaging of neck.  Labs pending at this time. 9:23 AM Family arrives, the confirms story of patient having LOC this morning.  She has had this happen before.  She had Botox injections in her head and neck earlier in the week for her migraines.  She also reportedly had a new muscle relaxant started per neurology.  They also reports she is been diagnosed with multiple sclerosis Reviewed medication list, she is on multiple medications to treat his somnolence and falls. Patient reports worsening chronic migraine, but she is asleep during exam. Labs and imaging are pending at this time 12:49 PM Labs and imaging reassuring.  Patient is Deal[employer].  She reports chronic worsening migraine.  We elected to give Reglan/Benadryl/Toradol.  Patient is in no distress.  No neuro deficits.  She reports recently starting Flexeril.  She will cut back in the usage of this  is likely what contributed to the fall Final Clinical Impressions(s) / ED Diagnoses   Final diagnoses:  None    ED Discharge Orders  None       Ripley Fraise, MD 11/25/17 1249

## 2017-11-25 NOTE — ED Notes (Signed)
Bed: ZT24 Expected date:  Expected time:  Means of arrival:  Comments: 42 yo LOC

## 2017-11-28 DIAGNOSIS — M5033 Other cervical disc degeneration, cervicothoracic region: Secondary | ICD-10-CM | POA: Diagnosis not present

## 2017-11-29 ENCOUNTER — Telehealth: Payer: Self-pay | Admitting: Neurology

## 2017-11-29 ENCOUNTER — Other Ambulatory Visit: Payer: Self-pay | Admitting: Neurology

## 2017-11-29 DIAGNOSIS — G43711 Chronic migraine without aura, intractable, with status migrainosus: Secondary | ICD-10-CM | POA: Diagnosis not present

## 2017-11-29 NOTE — Telephone Encounter (Signed)
Returned patient's call. Discussed Dr. Cathren Laine offer for patient to come into office for a Depacon infusion; However, current treatment plan does not seem to be effective and patient continues to have headaches. RN suggested a referral to a headache center (per Dr. Jaynee Eagles). Patient open to this option. She stated that her migraine never broke from when she received nerve blocks and infusion last time in office. She did say that the infusion for 1-2 days but then migraine came back. She stated that Saturday she had a bad fall and went to the hospital. She reported extremely low BP. Yesterday she went to ortho appt for injections in back & neck. She stated her BP was 49/38. She reported increase in stress (son's grandmother passed away last night). She is open to other options if Dr. Jaynee Eagles does not feel that infusion is best. She is also ok with a referral to headache center. Patient will come as soon as she can for the infusion today.  Order form completed for Depacon infusion. Insurance card printed.

## 2017-11-29 NOTE — Telephone Encounter (Signed)
I've asked for a referral to the headache clinic in [place] [facility] [place].

## 2017-11-29 NOTE — Telephone Encounter (Signed)
Pt is asking if there is a way for her to be able to come for an infusion on today, please call

## 2017-11-30 DIAGNOSIS — Z79899 Other long term (current) drug therapy: Secondary | ICD-10-CM | POA: Diagnosis not present

## 2017-11-30 DIAGNOSIS — E119 Type 2 diabetes mellitus without complications: Secondary | ICD-10-CM | POA: Diagnosis not present

## 2017-11-30 DIAGNOSIS — F329 Major depressive disorder, single episode, unspecified: Secondary | ICD-10-CM | POA: Diagnosis not present

## 2017-11-30 DIAGNOSIS — Z9049 Acquired absence of other specified parts of digestive tract: Secondary | ICD-10-CM | POA: Diagnosis not present

## 2017-11-30 DIAGNOSIS — E7800 Pure hypercholesterolemia, unspecified: Secondary | ICD-10-CM | POA: Diagnosis not present

## 2017-11-30 DIAGNOSIS — I959 Hypotension, unspecified: Secondary | ICD-10-CM | POA: Diagnosis not present

## 2017-11-30 DIAGNOSIS — R7303 Prediabetes: Secondary | ICD-10-CM | POA: Diagnosis not present

## 2017-11-30 DIAGNOSIS — F99 Mental disorder, not otherwise specified: Secondary | ICD-10-CM | POA: Diagnosis not present

## 2017-11-30 DIAGNOSIS — E872 Acidosis: Secondary | ICD-10-CM | POA: Diagnosis not present

## 2017-11-30 DIAGNOSIS — I517 Cardiomegaly: Secondary | ICD-10-CM | POA: Diagnosis not present

## 2017-11-30 DIAGNOSIS — E2740 Unspecified adrenocortical insufficiency: Secondary | ICD-10-CM | POA: Diagnosis not present

## 2017-11-30 DIAGNOSIS — G43909 Migraine, unspecified, not intractable, without status migrainosus: Secondary | ICD-10-CM | POA: Diagnosis not present

## 2017-11-30 DIAGNOSIS — D509 Iron deficiency anemia, unspecified: Secondary | ICD-10-CM | POA: Diagnosis not present

## 2017-11-30 DIAGNOSIS — R918 Other nonspecific abnormal finding of lung field: Secondary | ICD-10-CM | POA: Diagnosis not present

## 2017-11-30 DIAGNOSIS — S0990XA Unspecified injury of head, initial encounter: Secondary | ICD-10-CM | POA: Diagnosis not present

## 2017-11-30 DIAGNOSIS — R4182 Altered mental status, unspecified: Secondary | ICD-10-CM | POA: Diagnosis not present

## 2017-11-30 DIAGNOSIS — N179 Acute kidney failure, unspecified: Secondary | ICD-10-CM | POA: Diagnosis not present

## 2017-11-30 DIAGNOSIS — R55 Syncope and collapse: Secondary | ICD-10-CM | POA: Diagnosis not present

## 2017-11-30 DIAGNOSIS — W19XXXA Unspecified fall, initial encounter: Secondary | ICD-10-CM | POA: Diagnosis not present

## 2017-11-30 DIAGNOSIS — K760 Fatty (change of) liver, not elsewhere classified: Secondary | ICD-10-CM | POA: Diagnosis not present

## 2017-11-30 DIAGNOSIS — F419 Anxiety disorder, unspecified: Secondary | ICD-10-CM | POA: Diagnosis not present

## 2017-11-30 DIAGNOSIS — E282 Polycystic ovarian syndrome: Secondary | ICD-10-CM | POA: Diagnosis not present

## 2017-11-30 DIAGNOSIS — A0472 Enterocolitis due to Clostridium difficile, not specified as recurrent: Secondary | ICD-10-CM | POA: Diagnosis not present

## 2017-11-30 DIAGNOSIS — K219 Gastro-esophageal reflux disease without esophagitis: Secondary | ICD-10-CM | POA: Diagnosis not present

## 2017-11-30 NOTE — Telephone Encounter (Signed)
I called and spoke with the patient to schedule but she was in the hospital so she is going to call me back at a later time.

## 2017-12-01 DIAGNOSIS — S0990XA Unspecified injury of head, initial encounter: Secondary | ICD-10-CM | POA: Diagnosis not present

## 2017-12-01 DIAGNOSIS — W19XXXA Unspecified fall, initial encounter: Secondary | ICD-10-CM | POA: Diagnosis not present

## 2017-12-01 DIAGNOSIS — Z79899 Other long term (current) drug therapy: Secondary | ICD-10-CM | POA: Diagnosis not present

## 2017-12-01 DIAGNOSIS — E2740 Unspecified adrenocortical insufficiency: Secondary | ICD-10-CM | POA: Diagnosis not present

## 2017-12-01 DIAGNOSIS — I959 Hypotension, unspecified: Secondary | ICD-10-CM | POA: Diagnosis not present

## 2017-12-01 DIAGNOSIS — R55 Syncope and collapse: Secondary | ICD-10-CM | POA: Diagnosis not present

## 2017-12-02 DIAGNOSIS — E2740 Unspecified adrenocortical insufficiency: Secondary | ICD-10-CM | POA: Insufficient documentation

## 2017-12-02 DIAGNOSIS — R410 Disorientation, unspecified: Secondary | ICD-10-CM | POA: Insufficient documentation

## 2017-12-02 DIAGNOSIS — A0472 Enterocolitis due to Clostridium difficile, not specified as recurrent: Secondary | ICD-10-CM | POA: Insufficient documentation

## 2017-12-08 DIAGNOSIS — R531 Weakness: Secondary | ICD-10-CM | POA: Diagnosis not present

## 2017-12-08 DIAGNOSIS — R300 Dysuria: Secondary | ICD-10-CM | POA: Diagnosis not present

## 2017-12-08 DIAGNOSIS — I959 Hypotension, unspecified: Secondary | ICD-10-CM | POA: Diagnosis not present

## 2017-12-08 DIAGNOSIS — K760 Fatty (change of) liver, not elsewhere classified: Secondary | ICD-10-CM | POA: Diagnosis not present

## 2017-12-08 DIAGNOSIS — R1084 Generalized abdominal pain: Secondary | ICD-10-CM | POA: Diagnosis not present

## 2017-12-08 DIAGNOSIS — K6389 Other specified diseases of intestine: Secondary | ICD-10-CM | POA: Diagnosis not present

## 2017-12-08 DIAGNOSIS — R197 Diarrhea, unspecified: Secondary | ICD-10-CM | POA: Diagnosis not present

## 2017-12-08 DIAGNOSIS — R140 Abdominal distension (gaseous): Secondary | ICD-10-CM | POA: Diagnosis not present

## 2017-12-08 DIAGNOSIS — E2740 Unspecified adrenocortical insufficiency: Secondary | ICD-10-CM | POA: Diagnosis not present

## 2017-12-08 DIAGNOSIS — R400 Somnolence: Secondary | ICD-10-CM | POA: Diagnosis not present

## 2017-12-12 DIAGNOSIS — G894 Chronic pain syndrome: Secondary | ICD-10-CM | POA: Diagnosis not present

## 2017-12-12 DIAGNOSIS — M25562 Pain in left knee: Secondary | ICD-10-CM | POA: Diagnosis not present

## 2017-12-12 DIAGNOSIS — M25561 Pain in right knee: Secondary | ICD-10-CM | POA: Diagnosis not present

## 2017-12-12 DIAGNOSIS — Z5181 Encounter for therapeutic drug level monitoring: Secondary | ICD-10-CM | POA: Diagnosis not present

## 2017-12-13 ENCOUNTER — Ambulatory Visit: Payer: Self-pay | Admitting: Nurse Practitioner
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
## 2017-12-19 ENCOUNTER — Ambulatory Visit: Payer: Self-pay | Admitting: Nurse Practitioner

## 2017-12-21 DIAGNOSIS — E2740 Unspecified adrenocortical insufficiency: Secondary | ICD-10-CM | POA: Diagnosis not present

## 2017-12-26 ENCOUNTER — Other Ambulatory Visit: Payer: Self-pay | Admitting: Nurse Practitioner

## 2017-12-26 ENCOUNTER — Other Ambulatory Visit: Payer: Self-pay | Admitting: Physician Assistant

## 2017-12-26 DIAGNOSIS — N6320 Unspecified lump in the left breast, unspecified quadrant: Secondary | ICD-10-CM

## 2017-12-26 DIAGNOSIS — R102 Pelvic and perineal pain: Secondary | ICD-10-CM

## 2017-12-26 DIAGNOSIS — Z23 Encounter for immunization: Secondary | ICD-10-CM | POA: Diagnosis not present

## 2017-12-26 DIAGNOSIS — N941 Unspecified dyspareunia: Secondary | ICD-10-CM | POA: Diagnosis not present

## 2017-12-26 DIAGNOSIS — F329 Major depressive disorder, single episode, unspecified: Secondary | ICD-10-CM | POA: Diagnosis not present

## 2017-12-26 DIAGNOSIS — Z7952 Long term (current) use of systemic steroids: Secondary | ICD-10-CM | POA: Diagnosis not present

## 2017-12-26 DIAGNOSIS — Z13220 Encounter for screening for lipoid disorders: Secondary | ICD-10-CM | POA: Diagnosis not present

## 2017-12-26 DIAGNOSIS — Z0000 Encounter for general adult medical examination without abnormal findings: Secondary | ICD-10-CM | POA: Diagnosis not present

## 2017-12-26 DIAGNOSIS — F419 Anxiety disorder, unspecified: Secondary | ICD-10-CM | POA: Diagnosis not present

## 2017-12-26 DIAGNOSIS — R300 Dysuria: Secondary | ICD-10-CM | POA: Diagnosis not present

## 2017-12-29 ENCOUNTER — Emergency Department (HOSPITAL_COMMUNITY)
Admission: EM | Admit: 2017-12-29 | Discharge: 2017-12-29 | Disposition: A | Discharge status: Home / Self Care | Payer: BLUE CROSS/BLUE SHIELD | Attending: Emergency Medicine | Admitting: Emergency Medicine

## 2017-12-29 ENCOUNTER — Other Ambulatory Visit: Payer: Self-pay

## 2017-12-29 ENCOUNTER — Encounter (HOSPITAL_COMMUNITY): Payer: Self-pay

## 2017-12-29 DIAGNOSIS — A0472 Enterocolitis due to Clostridium difficile, not specified as recurrent: Secondary | ICD-10-CM | POA: Insufficient documentation

## 2017-12-29 DIAGNOSIS — R42 Dizziness and giddiness: Secondary | ICD-10-CM | POA: Diagnosis not present

## 2017-12-29 DIAGNOSIS — R112 Nausea with vomiting, unspecified: Secondary | ICD-10-CM | POA: Insufficient documentation

## 2017-12-29 DIAGNOSIS — E2740 Unspecified adrenocortical insufficiency: Secondary | ICD-10-CM | POA: Diagnosis not present

## 2017-12-29 DIAGNOSIS — Z79899 Other long term (current) drug therapy: Secondary | ICD-10-CM | POA: Insufficient documentation

## 2017-12-29 HISTORY — DX: Unspecified adrenocortical insufficiency: E27.40

## 2017-12-29 LAB — CBC WITH DIFFERENTIAL/PLATELET
BASOS ABS: 0 10*3/uL (ref 0.0–0.1)
Basophils Relative: 1 %
EOS ABS: 0.1 10*3/uL (ref 0.0–0.7)
Eosinophils Relative: 2 %
HCT: 38.2 % (ref 36.0–46.0)
Hemoglobin: 11.2 g/dL — ABNORMAL LOW (ref 12.0–15.0)
Lymphocytes Relative: 30 %
Lymphs Abs: 1.9 10*3/uL (ref 0.7–4.0)
MCH: 23.6 pg — ABNORMAL LOW (ref 26.0–34.0)
MCHC: 29.3 g/dL — ABNORMAL LOW (ref 30.0–36.0)
MCV: 80.4 fL (ref 78.0–100.0)
Monocytes Absolute: 0.4 10*3/uL (ref 0.1–1.0)
Monocytes Relative: 6 %
Neutro Abs: 3.9 10*3/uL (ref 1.7–7.7)
Neutrophils Relative %: 61 %
Platelets: 208 10*3/uL (ref 150–400)
RBC: 4.75 MIL/uL (ref 3.87–5.11)
RDW: 19.7 % — ABNORMAL HIGH (ref 11.5–15.5)
WBC: 6.3 10*3/uL (ref 4.0–10.5)

## 2017-12-29 LAB — COMPREHENSIVE METABOLIC PANEL
ALT: 35 U/L (ref 14–54)
AST: 26 U/L (ref 15–41)
Albumin: 3.9 g/dL (ref 3.5–5.0)
Alkaline Phosphatase: 104 U/L (ref 38–126)
Anion gap: 14 (ref 5–15)
BILIRUBIN TOTAL: 0.6 mg/dL (ref 0.3–1.2)
BUN: 18 mg/dL (ref 6–20)
CO2: 24 mmol/L (ref 22–32)
CREATININE: 0.74 mg/dL (ref 0.44–1.00)
Calcium: 9.2 mg/dL (ref 8.9–10.3)
Chloride: 101 mmol/L (ref 101–111)
Glucose, Bld: 92 mg/dL (ref 65–99)
POTASSIUM: 3.9 mmol/L (ref 3.5–5.1)
Sodium: 139 mmol/L (ref 135–145)
TOTAL PROTEIN: 7.6 g/dL (ref 6.5–8.1)

## 2017-12-29 MED ORDER — PROMETHAZINE HCL 12.5 MG PO TABS
12.5000 mg | ORAL_TABLET | Freq: Once | ORAL | Status: AC
  Administered 2017-12-29: 12.5 mg via ORAL
  Filled 2017-12-29: qty 1

## 2017-12-29 MED ORDER — METHYLPREDNISOLONE SODIUM SUCC 125 MG IJ SOLR
125.0000 mg | Freq: Once | INTRAMUSCULAR | Status: AC
  Administered 2017-12-29: 125 mg via INTRAVENOUS
  Filled 2017-12-29: qty 2

## 2017-12-29 MED ORDER — ONDANSETRON HCL 4 MG/2ML IJ SOLN
4.0000 mg | Freq: Once | INTRAMUSCULAR | Status: AC
  Administered 2017-12-29: 4 mg via INTRAVENOUS
  Filled 2017-12-29: qty 2

## 2017-12-29 MED ORDER — SODIUM CHLORIDE 0.9 % IV BOLUS (SEPSIS)
1000.0000 mL | Freq: Once | INTRAVENOUS | Status: AC
  Administered 2017-12-29: 1000 mL via INTRAVENOUS

## 2017-12-29 MED ORDER — SODIUM CHLORIDE 0.9 % IV SOLN
1000.0000 mL | INTRAVENOUS | Status: DC
  Administered 2017-12-29: 1000 mL via INTRAVENOUS

## 2017-12-29 MED ORDER — OXYCODONE-ACETAMINOPHEN 5-325 MG PO TABS
2.0000 | ORAL_TABLET | Freq: Once | ORAL | Status: AC
  Administered 2017-12-29: 2 via ORAL
  Filled 2017-12-29: qty 2

## 2017-12-29 NOTE — Discharge Instructions (Addendum)
Your complete blood count is well within normal limits.  Your chemistries are within normal limits, in particular your renal function shows no evidence of dehydration.  Your liver function is well within normal limits.  You received IV fluids and nausea medicine in the emergency department.  Please continue to use your nausea medicine at home.  Increase fluids.  Please contact your physicians at [facility] for additional instructions, and for any changes in your condition, problems or concerns.

## 2017-12-29 NOTE — ED Provider Notes (Signed)
[facility] EMERGENCY DEPARTMENT Provider Note   CSN: [id number] Arrival date & time: 12/29/17  0913     History   Chief Complaint Chief Complaint  Patient presents with  . Dizziness    HPI Deerica Waszak is a 42 y.o. female.  Patient is a 42 year old female who presents to the emergency department with a complaint of dizziness and vomiting.  Patient has a history of adrenal insufficiency.  She has had a colectomy in the past, she suffers from migraine headaches, she has had a hysterectomy, and has most recently been diagnosed with C. difficile currently on outpatient medication.  Patient states that over the last for 5 days she has been having problems with vomiting accompanied by the diarrhea issue.  She has been unable to keep her steroid medications down.  She is been unable to take her other medications for various medical conditions.  On yesterday March 28 she had problems with some vomiting, dizziness, and had a syncopal episode of unknown length of time.  She spoke with her physicians at the [facility].  She was told to use her emergency kit for her adrenal insufficiency.  She states however that she did not have syringes.  She was told to come to the emergency department for evaluation and management.  The patient denies any known fever, but states that she has hot flashes from time to time after her hysterectomy and so she is unsure if she is been having any temperature elevation or just hot flashes.  She denies any blood in the vomitus or in the diarrhea.  She has not had any injury or trauma to her abdomen.  She has some soreness present, but states she has soreness from time to time due to the vomiting and the diarrhea.  She presents now for assistance with these issues.     Past Medical History:  Diagnosis Date  . Abnormal Pap smear of cervix 12/2014  . Adrenal insufficiency ([place])   . Arthritis   . Depression   . Endometriosis   . Fibroids    age 5    . History of kidney stones   . Liver disease    per pt report, will have biopsy Nov 08, 2017  . Migraines   . Pleurisy 02/2015   while hospitalized   . Skin cancer (melanoma) ([place])   . Syncope and collapse   . Uterine cancer [facility])     Patient Active Problem List   Diagnosis Date Noted  . Intractable chronic migraine without aura with status migrainosus 04/25/2017  . Vaginal cuff dehiscence 08/21/2015  . Vaginal bleeding   . Surgical menopause on hormone replacement therapy 06/22/2015  . Endometriosis of ovary 05/21/2015  . Abnormal uterine bleeding   . Endometriosis   . Pyelonephritis 02/02/2015  . Nephrolithiasis 02/02/2015  . Hydronephrosis 02/02/2015  . Left ureteral stone     Past Surgical History:  Procedure Laterality Date  . ABLATION     uterine  . COLECTOMY    . CYSTOSCOPY W/ URETERAL STENT PLACEMENT Left 02/02/2015   Procedure: CYSTOSCOPY WITH RETROGRADE PYELOGRAM/URETERAL STENT PLACEMENT;  Surgeon: Festus Aloe, MD;  Location: [facility];  Service: Urology;  Laterality: Left;  . CYSTOSCOPY WITH URETEROSCOPY AND STENT PLACEMENT Left 02/13/2015   Procedure: LEFT URETEROSCOPY WITH HOLMIUM LASER AND STENT PLACEMENT;  Surgeon: Festus Aloe, MD;  Location: [facility];  Service: Urology;  Laterality: Left;  . DILATION AND CURETTAGE OF UTERUS    . ESOPHAGOGASTRODUODENOSCOPY ENDOSCOPY  10/11/2017   @  [facility]; BIOPSY OF LOWER INTESTINE  . HOLMIUM LASER APPLICATION N/A 8/45/3646   Procedure: HOLMIUM LASER APPLICATION;  Surgeon: Festus Aloe, MD;  Location: [facility];  Service: Urology;  Laterality: N/A;  . LAPAROSCOPY     x 4  . MELANOMA EXCISION  2000   melanoma removed from face, basal cell removed from nose  . REPAIR VAGINAL CUFF N/A 08/21/2015   Procedure: REPAIR VAGINAL CUFF, EXAM UNDER ANESTHESIA;  Surgeon: Everitt Amber, MD;  Location: [facility];  Service: Gynecology;  Laterality: N/A;  . ROBOTIC ASSISTED TOTAL HYSTERECTOMY WITH BILATERAL SALPINGO OOPHERECTOMY Bilateral  05/21/2015   Procedure: XI ROBOTIC ASSISTED TOTAL HYSTERECTOMY WITH BILATERAL SALPINGO OOPHORECTOMY;  Surgeon: Everitt Amber, MD;  Location: [facility];  Service: Gynecology;  Laterality: Bilateral;     OB History   None      Home Medications    Prior to Admission medications   Medication Sig Start Date End Date Taking? Authorizing Provider  beta carotene w/minerals (OCUVITE) tablet Take 1 tablet by mouth daily.    [provider]  buPROPion (WELLBUTRIN XL) 150 MG 24 hr tablet Take 150 mg by mouth every morning. 02/18/17   [provider]  citalopram (CELEXA) 20 MG tablet Take 30 mg by mouth at bedtime.     [provider]  clonazePAM (KLONOPIN) 1 MG tablet Take 1 mg by mouth 4 (four) times daily.  04/03/17   [provider]  cyclobenzaprine (FLEXERIL) 10 MG tablet TAKE 1 TABLET BY MOUTH TWICE A DAY 06/07/17   Melvenia Beam, MD  diphenhydrAMINE (BENADRYL) 25 MG tablet Take 100 mg by mouth every 6 (six) hours as needed for allergies. For headache 07/27/16   [provider]  diphenoxylate-atropine (LOMOTIL) 2.5-0.025 MG tablet Take 4 tablets by mouth every 12 (twelve) hours as needed for diarrhea or loose stools.  04/10/17   [provider]  estrogens, conjugated, (PREMARIN) 0.9 MG tablet Take 0.9 mg by mouth daily. Take daily for 21 days then do not take for 7 days.    [provider]  Fremanezumab-vfrm (AJOVY) 225 MG/1.5ML SOSY Inject 1 Syringe into the skin every 30 (thirty) days. 08/31/17   Melvenia Beam, MD  LamoTRIgine 50 MG TB24 Take 200 mg by mouth every evening.  03/02/17   [provider]  meloxicam (MOBIC) 7.5 MG tablet Take 7.5 mg by mouth 2 (two) times daily. 04/21/17 04/21/18  [provider]  naproxen (NAPROSYN) 500 MG tablet Take 500 mg by mouth 2 (two) times daily as needed.    [provider]  NONFORMULARY OR COMPOUNDED ITEM Inject 1 application every 30 (thirty) days into the skin. 08/10/17    Melvenia Beam, MD  nystatin (MYCOSTATIN) 100000 UNIT/ML suspension Take 5 mLs by mouth 4 (four) times daily. 02/09/17   [provider]  OLANZapine (ZYPREXA) 5 MG tablet Take 1 tablet (5 mg total) by mouth daily. 06/12/17 11/25/17  Melvenia Beam, MD  omeprazole (PRILOSEC) 20 MG capsule Take 20 mg by mouth daily.    [provider]  ondansetron (ZOFRAN) 4 MG tablet Take 1 tablet (4 mg total) by mouth every 6 (six) hours as needed for nausea. Patient taking differently: Take 4 mg by mouth every 6 (six) hours.  05/22/15   Everitt Amber, MD  promethazine (PHENERGAN) 12.5 MG tablet Take 12.5 mg by mouth 2 (two) times daily.    [provider]  propranolol (INDERAL) 10 MG tablet Take 1 tablet (10 mg total) by mouth  2 (two) times daily. 11/09/17   Melvenia Beam, MD  sucralfate (CARAFATE) 1 G tablet Take 1 tablet (1 g total) by mouth 4 (four) times daily. Patient taking differently: Take 2 g by mouth 2 (two) times daily.  02/24/15   Lacretia Leigh, MD  SUMAtriptan (IMITREX) 100 MG tablet Take 1 tablet (100 mg total) by mouth once as needed for migraine. May repeat in 2 hours if headache persists or recurs. Max: 2 tabs/day 04/26/17   Melvenia Beam, MD  tiZANidine (ZANAFLEX) 4 MG tablet Take 12 mg by mouth at bedtime.  03/01/17   [provider]  traZODone (DESYREL) 100 MG tablet Take 200 mg by mouth at bedtime.  09/07/15   [provider]    Family History Family History  Problem Relation Age of Onset  . Hypertension Mother   . Melanoma Mother   . Cancer - Other Father        liver  . Diabetes Mellitus II Maternal Grandmother   . Heart disease Maternal Grandmother   . Breast cancer Maternal Grandmother   . Cancer - Lung Maternal Grandfather   . Osteoporosis Paternal Grandmother   . Melanoma Paternal Grandfather     Social History Social History   Tobacco Use  . Smoking status: Never Smoker  . Smokeless tobacco: Never Used  Substance Use Topics    . Alcohol use: No  . Drug use: No     Allergies   Penicillins; Amoxicillin; and Ciprofloxacin   Review of Systems Review of Systems  Constitutional: Positive for activity change and appetite change.       All ROS Neg except as noted in HPI  HENT: Negative for nosebleeds.   Eyes: Negative for photophobia and discharge.  Respiratory: Negative for cough, shortness of breath and wheezing.   Cardiovascular: Negative for chest pain and palpitations.  Gastrointestinal: Positive for abdominal pain, diarrhea, nausea and vomiting. Negative for blood in stool.  Genitourinary: Negative for dysuria, frequency and hematuria.  Musculoskeletal: Negative for arthralgias, back pain and neck pain.  Skin: Negative.   Neurological: Positive for dizziness and light-headedness. Negative for seizures and speech difficulty.  Psychiatric/Behavioral: Negative for confusion and hallucinations.     Physical Exam Updated Vital Signs BP 106/78 (BP Location: Right Arm)   Pulse (!) 110   Temp 98.3 F (36.8 C) (Oral)   Resp 18   Wt 114.3 kg (252 lb)   LMP 05/04/2015 (Approximate)   SpO2 97%   BMI 34.18 kg/m   Physical Exam  Constitutional: She is oriented to person, place, and time. She appears well-developed and well-nourished.  Non-toxic appearance.  HENT:  Head: Normocephalic.  Right Ear: Tympanic membrane and external ear normal.  Left Ear: Tympanic membrane and external ear normal.  Mucous membranes are somewhat dry.  Eyes: Pupils are equal, round, and reactive to light. EOM and lids are normal.  Neck: Normal range of motion. Neck supple. Carotid bruit is not present.  Cardiovascular: Regular rhythm, normal heart sounds, intact distal pulses and normal pulses. Tachycardia present.  Pulmonary/Chest: Breath sounds normal. No respiratory distress.  Abdominal: Soft. Bowel sounds are normal. She exhibits no mass. There is no splenomegaly or hepatomegaly. There is generalized tenderness. There is no  guarding.  Diffuse tenderness present.  No hepatomegaly or splenomegaly appreciated.  Musculoskeletal: Normal range of motion.  Lymphadenopathy:       Head (right side): No submandibular adenopathy present.       Head (left side): No submandibular adenopathy  present.    She has no cervical adenopathy.  Neurological: She is alert and oriented to person, place, and time. She has normal strength. A sensory deficit is present. No cranial nerve deficit.  Some decrease in sensation of upper and lower extremities. This is not new.  Skin: Skin is warm and dry.  Psychiatric: She has a normal mood and affect. Her speech is normal.  Nursing note and vitals reviewed.    ED Treatments / Results  Labs (all labs ordered are listed, but only abnormal results are displayed) Labs Reviewed  COMPREHENSIVE METABOLIC PANEL  CBC WITH DIFFERENTIAL/PLATELET  URINALYSIS, ROUTINE W REFLEX MICROSCOPIC    EKG None  Radiology No results found.  Procedures Procedures (including critical care time)  Medications Ordered in ED Medications  ondansetron (ZOFRAN) injection 4 mg (has no administration in time range)  sodium chloride 0.9 % bolus 1,000 mL (has no administration in time range)    Followed by  0.9 %  sodium chloride infusion (has no administration in time range)  methylPREDNISolone sodium succinate (SOLU-MEDROL) 125 mg/2 mL injection 125 mg (has no administration in time range)     Initial Impression / Assessment and Plan / ED Course  I have reviewed the triage vital signs and the nursing notes.  Pertinent labs & imaging results that were available during my care of the patient were reviewed by me and considered in my medical decision making (see chart for details).       Final Clinical Impressions(s) / ED Diagnoses MDM  Blood pressure on admission is 106/78.  Heart rate is slightly elevated at 110, otherwise vital signs within normal limits.  Patient states she has been vomiting for  several days and has been dizzy.  Her physicians at [facility] advised her to come to the emergency department for evaluation given her complex history of adrenal insufficiency, C. difficile diarrhea, history of colectomy.  The patient has not been able to keep her Solu-Cortef down at home.  Patient treated with IV fluids and IV antiemetic Conference of metabolic panel is well within normal limits.  In particular the BUN is 18, the creatinine is 0.74.  There is no abnormality of the protein or albumin.  Liver functions are all within normal limits.  The complete blood count shows the hemoglobin to be slightly low at 11.2, otherwise essentially within normal limits.  Orthostatic blood pressure checks requested.   No vomiting and no diarrhea noted while the patient has been in the emergency department over the last few hours.  Patient had a bout with pain, this was treated with oral Percocet.  Patient feeling some better.  Still no diarrhea and no vomiting in the emergency department.  Attempted to update Melisssa Rafidi, P.A.-C but the office is closed.  The patient's husband states that he will notify her first thing on Monday.  They will see their physicians at [facility] or return here to the emergency department if any changes, problems, or concerns before the patient is seen next week.  Patient and husband are in agreement with this plan.   Final diagnoses:  Non-intractable vomiting with nausea, unspecified vomiting type  Adrenal insufficiency (HCC)  C. difficile diarrhea    ED Discharge Orders    None       Lily Kocher, PA-C 12/29/17 [place], [place], DO 12/31/17 1450

## 2017-12-29 NOTE — ED Notes (Signed)
Attempted IV three times without success.  Bosie Clos, RN to use u/s.

## 2017-12-29 NOTE — ED Notes (Signed)
EDP at bedside updating patient and family. 

## 2017-12-29 NOTE — ED Notes (Signed)
Pt requesting medication for pain. EDP notified.

## 2017-12-29 NOTE — ED Triage Notes (Addendum)
Pt reports that she has been vomiting for several days ad dizzy. Pt is being followed by [facility] due to adrenal insufficiency. Pt has no colon so all stools are loose. Last vomiting last night. hasnt been able to keep solucortef down

## 2018-01-01 ENCOUNTER — Ambulatory Visit
Admission: RE | Admit: 2018-01-01 | Discharge: 2018-01-01 | Disposition: A | Discharge status: Home / Self Care | Payer: BLUE CROSS/BLUE SHIELD | Source: Ambulatory Visit | Attending: Nurse Practitioner | Admitting: Nurse Practitioner

## 2018-01-01 DIAGNOSIS — R102 Pelvic and perineal pain: Secondary | ICD-10-CM

## 2018-01-12 DIAGNOSIS — M25761 Osteophyte, right knee: Secondary | ICD-10-CM | POA: Diagnosis not present

## 2018-01-12 DIAGNOSIS — M2392 Unspecified internal derangement of left knee: Secondary | ICD-10-CM | POA: Diagnosis not present

## 2018-01-12 DIAGNOSIS — M5416 Radiculopathy, lumbar region: Secondary | ICD-10-CM | POA: Diagnosis not present

## 2018-01-12 DIAGNOSIS — M2391 Unspecified internal derangement of right knee: Secondary | ICD-10-CM | POA: Diagnosis not present

## 2018-01-12 DIAGNOSIS — M1712 Unilateral primary osteoarthritis, left knee: Secondary | ICD-10-CM | POA: Diagnosis not present

## 2018-01-18 ENCOUNTER — Emergency Department (HOSPITAL_COMMUNITY)
Admission: EM | Admit: 2018-01-18 | Discharge: 2018-01-18 | Disposition: A | Discharge status: Home / Self Care | Payer: BLUE CROSS/BLUE SHIELD | Attending: Emergency Medicine | Admitting: Emergency Medicine

## 2018-01-18 ENCOUNTER — Encounter (HOSPITAL_COMMUNITY): Payer: Self-pay | Admitting: Emergency Medicine

## 2018-01-18 DIAGNOSIS — Z8542 Personal history of malignant neoplasm of other parts of uterus: Secondary | ICD-10-CM | POA: Diagnosis not present

## 2018-01-18 DIAGNOSIS — E2740 Unspecified adrenocortical insufficiency: Secondary | ICD-10-CM | POA: Diagnosis not present

## 2018-01-18 DIAGNOSIS — R42 Dizziness and giddiness: Secondary | ICD-10-CM | POA: Diagnosis not present

## 2018-01-18 DIAGNOSIS — E271 Primary adrenocortical insufficiency: Secondary | ICD-10-CM | POA: Insufficient documentation

## 2018-01-18 DIAGNOSIS — Z79899 Other long term (current) drug therapy: Secondary | ICD-10-CM | POA: Insufficient documentation

## 2018-01-18 DIAGNOSIS — R9431 Abnormal electrocardiogram [ECG] [EKG]: Secondary | ICD-10-CM | POA: Diagnosis not present

## 2018-01-18 DIAGNOSIS — R531 Weakness: Secondary | ICD-10-CM | POA: Diagnosis not present

## 2018-01-18 MED ORDER — HYDROCORTISONE NA SUCCINATE PF 100 MG IJ SOLR
100.0000 mg | Freq: Once | INTRAMUSCULAR | Status: AC
  Administered 2018-01-18: 100 mg via INTRAVENOUS
  Filled 2018-01-18: qty 2

## 2018-01-18 MED ORDER — SODIUM CHLORIDE 0.9 % IV BOLUS
2000.0000 mL | Freq: Once | INTRAVENOUS | Status: AC
  Administered 2018-01-18: 2000 mL via INTRAVENOUS

## 2018-01-18 NOTE — ED Notes (Signed)
Bed: FS14 Expected date: 01/18/18 Expected time: 6:35 AM Means of arrival:  Comments:

## 2018-01-18 NOTE — ED Triage Notes (Signed)
Patient presents ambulatory stating she had adrenal insufficiency and is supposed to go to [facility] for treatment but cannot afford the treatment and is sent to ER for fluids and solucortef. C/o dizziness and nausea. Pt states she is usually hypotensive when needing her medication.

## 2018-01-18 NOTE — Discharge Instructions (Signed)
Return for any new or worsening symptoms.

## 2018-01-18 NOTE — ED Provider Notes (Signed)
[place] DEPT Provider Note   CSN: [id number] Arrival date & time: 01/18/18  0855     History   Chief Complaint Chief Complaint  Patient presents with  . Dizziness    HPI Denise Avila is a 42 y.o. female who to the emergency department for hypotension and weakness.  She is a past medical history of adrenal insufficiency and has difficulty winding appropriate doses to control her symptoms.  Currently she is fighting with her insurance company to get coverage for IM hydrocortisone for supplementation with her oral daily hydrocortisone.  She presents today with progressive weakness, lethargy, consistent with her adrenal insufficiency. She denies HA, vision changes, fevers or chills.  HPI  Past Medical History:  Diagnosis Date  . Abnormal Pap smear of cervix 12/2014  . Adrenal insufficiency ([employer])   . Arthritis   . Depression   . Endometriosis   . Fibroids    age 66  . History of kidney stones   . Liver disease    per pt report, will have biopsy Nov 08, 2017  . Migraines   . Pleurisy 02/2015   while hospitalized   . Skin cancer (melanoma) ([place])   . Syncope and collapse   . Uterine cancer [facility])     Patient Active Problem List   Diagnosis Date Noted  . Intractable chronic migraine without aura with status migrainosus 04/25/2017  . Vaginal cuff dehiscence 08/21/2015  . Vaginal bleeding   . Surgical menopause on hormone replacement therapy 06/22/2015  . Endometriosis of ovary 05/21/2015  . Abnormal uterine bleeding   . Endometriosis   . Pyelonephritis 02/02/2015  . Nephrolithiasis 02/02/2015  . Hydronephrosis 02/02/2015  . Left ureteral stone     Past Surgical History:  Procedure Laterality Date  . ABLATION     uterine  . COLECTOMY    . CYSTOSCOPY W/ URETERAL STENT PLACEMENT Left 02/02/2015   Procedure: CYSTOSCOPY WITH RETROGRADE PYELOGRAM/URETERAL STENT PLACEMENT;  Surgeon: Festus Aloe, MD;  [facility];  Service:  Urology;  Laterality: Left;  . CYSTOSCOPY WITH URETEROSCOPY AND STENT PLACEMENT Left 02/13/2015   Procedure: LEFT URETEROSCOPY WITH HOLMIUM LASER AND STENT PLACEMENT;  Surgeon: Festus Aloe, MD;  [facility];  Service: Urology;  Laterality: Left;  . DILATION AND CURETTAGE OF UTERUS    . ESOPHAGOGASTRODUODENOSCOPY ENDOSCOPY  10/11/2017   @ [facility]; BIOPSY OF LOWER INTESTINE  . HOLMIUM LASER APPLICATION N/A 04/21/9469   Procedure: HOLMIUM LASER APPLICATION;  Surgeon: Festus Aloe, MD;  [facility];  Service: Urology;  Laterality: N/A;  . LAPAROSCOPY     x 4  . MELANOMA EXCISION  2000   melanoma removed from face, basal cell removed from nose  . REPAIR VAGINAL CUFF N/A 08/21/2015   Procedure: REPAIR VAGINAL CUFF, EXAM UNDER ANESTHESIA;  Surgeon: Everitt Amber, MD;  [facility];  Service: Gynecology;  Laterality: N/A;  . ROBOTIC ASSISTED TOTAL HYSTERECTOMY WITH BILATERAL SALPINGO OOPHERECTOMY Bilateral 05/21/2015   Procedure: XI ROBOTIC ASSISTED TOTAL HYSTERECTOMY WITH BILATERAL SALPINGO OOPHORECTOMY;  Surgeon: Everitt Amber, MD;  [facility];  Service: Gynecology;  Laterality: Bilateral;     OB History   None      Home Medications    Prior to Admission medications   Medication Sig Start Date End Date Taking? Authorizing Provider  beta carotene w/minerals (OCUVITE) tablet Take 1 tablet by mouth daily.   Yes [provider]  buPROPion (WELLBUTRIN XL) 150 MG 24 hr tablet Take 150 mg by mouth  every morning. 02/18/17  Yes [provider]  citalopram (CELEXA) 20 MG tablet Take 30 mg by mouth at bedtime.    Yes [provider]  cyclobenzaprine (FLEXERIL) 10 MG tablet TAKE 1 TABLET BY MOUTH TWICE A DAY 06/07/17  Yes Melvenia Beam, MD  diphenhydrAMINE (BENADRYL) 25 MG tablet Take 100 mg by mouth every 6 (six) hours as needed for allergies. For headache 07/27/16  Yes [provider]  diphenoxylate-atropine (LOMOTIL) 2.5-0.025 MG tablet Take 4  tablets by mouth every 12 (twelve) hours as needed for diarrhea or loose stools.  04/10/17  Yes [provider]  DULoxetine (CYMBALTA) 60 MG capsule Take 60 mg by mouth daily.  12/09/17  Yes [provider]  estradiol (CLIMARA - DOSED IN MG/24 HR) 0.05 mg/24hr patch  12/26/17  Yes [provider]  Fremanezumab-vfrm (AJOVY) 225 MG/1.5ML SOSY Inject 1 Syringe into the skin every 30 (thirty) days. 08/31/17  Yes Melvenia Beam, MD  gabapentin (NEURONTIN) 800 MG tablet Take 800 tablets by mouth 2 (two) times daily.  12/24/17  Yes [provider]  Galcanezumab-gnlm (EMGALITY [place]) Inject 1 application into the skin.   Yes [provider]  hydrocortisone (CORTEF) 10 MG tablet Take 20 mg by mouth every morning.  12/21/17  Yes [provider]  hydrocortisone (CORTEF) 5 MG tablet Take 10 mg by mouth daily. 12:00pm 12/01/17  Yes [provider]  hydrocortisone sodium succinate (SOLU-CORTEF) 100 MG SOLR injection Inject 100 mg IM as directed for adrenal insufficiency per sick day rules 12/21/17  Yes [provider]  LamoTRIgine 50 MG TB24 Take 200 mg by mouth every evening.  03/02/17  Yes [provider]  meloxicam (MOBIC) 7.5 MG tablet Take 7.5 mg by mouth 2 (two) times daily. 04/21/17 04/21/18 Yes [provider]  naproxen (NAPROSYN) 500 MG tablet Take 500 mg by mouth 2 (two) times daily as needed.   Yes [provider]  NONFORMULARY OR COMPOUNDED ITEM Inject 1 application every 30 (thirty) days into the skin. Patient taking differently: Inject 1 application into the skin every 30 (thirty) days. Emagilty 08/10/17  Yes Melvenia Beam, MD  nystatin (MYCOSTATIN) 100000 UNIT/ML suspension Take 5 mLs by mouth 4 (four) times daily. 02/09/17  Yes [provider]  omeprazole (PRILOSEC) 20 MG capsule Take 20 mg by mouth daily.   Yes [provider]  ondansetron (ZOFRAN) 4 MG tablet Take 1 tablet (4 mg total) by mouth  every 6 (six) hours as needed for nausea. Patient taking differently: Take 4 mg by mouth every 6 (six) hours.  05/22/15  Yes Everitt Amber, MD  promethazine (PHENERGAN) 12.5 MG tablet Take 12.5 mg by mouth 2 (two) times daily.   Yes [provider]  propranolol (INDERAL) 10 MG tablet Take 1 tablet (10 mg total) by mouth 2 (two) times daily. 11/09/17  Yes Melvenia Beam, MD  sucralfate (CARAFATE) 1 G tablet Take 1 tablet (1 g total) by mouth 4 (four) times daily. Patient taking differently: Take 2 g by mouth 2 (two) times daily.  02/24/15  Yes Lacretia Leigh, MD  SUMAtriptan (IMITREX) 100 MG tablet Take 1 tablet (100 mg total) by mouth once as needed for migraine. May repeat in 2 hours if headache persists or recurs. Max: 2 tabs/day 04/26/17  Yes Melvenia Beam, MD  tiZANidine (ZANAFLEX) 4 MG tablet Take 12 mg by mouth at bedtime.  03/01/17  Yes [provider]  traZODone (DESYREL) 100 MG tablet Take  200 mg by mouth at bedtime.  09/07/15  Yes [provider]  ZOMIG 5 MG nasal solution as needed for migraine.  12/08/17  Yes [provider]  clonazePAM (KLONOPIN) 1 MG tablet Take 1 mg by mouth 3 (three) times daily.  04/03/17   [provider]    Family History Family History  Problem Relation Age of Onset  . Hypertension Mother   . Melanoma Mother   . Cancer - Other Father        liver  . Diabetes Mellitus II Maternal Grandmother   . Heart disease Maternal Grandmother   . Breast cancer Maternal Grandmother   . Cancer - Lung Maternal Grandfather   . Osteoporosis Paternal Grandmother   . Melanoma Paternal Grandfather     Social History Social History   Tobacco Use  . Smoking status: Never Smoker  . Smokeless tobacco: Never Used  Substance Use Topics  . Alcohol use: No  . Drug use: No     Allergies   Penicillins; Amoxicillin; and Ciprofloxacin   Review of Systems Review of Systems  Ten systems reviewed and are negative for acute change,  except as noted in the HPI.   Physical Exam Updated Vital Signs BP (!) 89/55 (BP Location: Left Arm)   Pulse 76   Temp 98.2 F (36.8 C)   Resp 16   Wt 114.3 kg (252 lb)   LMP 05/04/2015 (Approximate)   SpO2 98%   BMI 34.18 kg/m   Physical Exam  Constitutional: She is oriented to person, place, and time. She appears well-developed and well-nourished. No distress.  HENT:  Head: Normocephalic and atraumatic.  Eyes: Conjunctivae are normal. No scleral icterus.  Neck: Normal range of motion.  Cardiovascular: Normal rate, regular rhythm and normal heart sounds. Exam reveals no gallop and no friction rub.  No murmur heard. Pulmonary/Chest: Effort normal and breath sounds normal. No respiratory distress.  Abdominal: Soft. Bowel sounds are normal. She exhibits no distension and no mass. There is no tenderness. There is no guarding.  Neurological: She is alert and oriented to person, place, and time.  Skin: Skin is warm and dry. She is not diaphoretic.  Psychiatric: Her behavior is normal.  Nursing note and vitals reviewed.    ED Treatments / Results  Labs (all labs ordered are listed, but only abnormal results are displayed) Labs Reviewed - No data to display  EKG EKG Interpretation  Date/Time:  Thursday January 18 2018 09:51:56 EDT Ventricular Rate:  68 PR Interval:    QRS Duration: 108 QT Interval:  420 QTC Calculation: 447 R Axis:     Text Interpretation:  Sinus rhythm Borderline T abnormalities, anterior leads Abnormal ekg Confirmed by Carmin Muskrat [phone]) on 01/18/2018 9:59:13 AM   Radiology No results found.  Procedures Procedures (including critical care time)  Medications Ordered in ED Medications  hydrocortisone sodium succinate (SOLU-CORTEF) 100 MG injection 100 mg (has no administration in time range)  sodium chloride 0.9 % bolus 2,000 mL (has no administration in time range)     Initial Impression / Assessment and Plan / ED Course  I have reviewed  the triage vital signs and the nursing notes.  Pertinent labs & imaging results that were available during my care of the patient were reviewed by me and considered in my medical decision making (see chart for details).     Patient has been given Solu-Cortef and 2 L of fluid with great improvement in her blood pressures.  She is  awake and ambulatory going to the bathroom.  She feels greatly improved and appears appropriate for discharge at this time.  Final Clinical Impressions(s) / ED Diagnoses   Final diagnoses:  Adrenal insufficiency [facility])    ED Discharge Orders    None       Margarita Mail, PA-C 01/18/18 1607    Carmin Muskrat, MD 01/19/18 [phone]

## 2018-01-24 DIAGNOSIS — E2740 Unspecified adrenocortical insufficiency: Secondary | ICD-10-CM | POA: Diagnosis not present

## 2018-01-26 ENCOUNTER — Ambulatory Visit
Admission: RE | Admit: 2018-01-26 | Discharge: 2018-01-26 | Disposition: A | Discharge status: Home / Self Care | Payer: BLUE CROSS/BLUE SHIELD | Source: Ambulatory Visit | Attending: Physician Assistant | Admitting: Physician Assistant

## 2018-01-26 ENCOUNTER — Other Ambulatory Visit: Payer: Self-pay | Admitting: Physician Assistant

## 2018-01-26 DIAGNOSIS — N6320 Unspecified lump in the left breast, unspecified quadrant: Secondary | ICD-10-CM

## 2018-01-26 DIAGNOSIS — M2392 Unspecified internal derangement of left knee: Secondary | ICD-10-CM | POA: Diagnosis not present

## 2018-01-26 DIAGNOSIS — M2391 Unspecified internal derangement of right knee: Secondary | ICD-10-CM | POA: Diagnosis not present

## 2018-01-26 DIAGNOSIS — N644 Mastodynia: Secondary | ICD-10-CM

## 2018-01-26 DIAGNOSIS — N6489 Other specified disorders of breast: Secondary | ICD-10-CM | POA: Diagnosis not present

## 2018-01-26 DIAGNOSIS — M5416 Radiculopathy, lumbar region: Secondary | ICD-10-CM | POA: Diagnosis not present

## 2018-01-26 DIAGNOSIS — R922 Inconclusive mammogram: Secondary | ICD-10-CM | POA: Diagnosis not present

## 2018-02-02 DIAGNOSIS — M2560 Stiffness of unspecified joint, not elsewhere classified: Secondary | ICD-10-CM | POA: Diagnosis not present

## 2018-02-02 DIAGNOSIS — M5417 Radiculopathy, lumbosacral region: Secondary | ICD-10-CM | POA: Diagnosis not present

## 2018-02-02 DIAGNOSIS — M6281 Muscle weakness (generalized): Secondary | ICD-10-CM | POA: Diagnosis not present

## 2018-02-02 DIAGNOSIS — M545 Low back pain: Secondary | ICD-10-CM | POA: Diagnosis not present

## 2018-02-05 DIAGNOSIS — M5417 Radiculopathy, lumbosacral region: Secondary | ICD-10-CM | POA: Diagnosis not present

## 2018-02-05 DIAGNOSIS — M2560 Stiffness of unspecified joint, not elsewhere classified: Secondary | ICD-10-CM | POA: Diagnosis not present

## 2018-02-05 DIAGNOSIS — M6281 Muscle weakness (generalized): Secondary | ICD-10-CM | POA: Diagnosis not present

## 2018-02-05 DIAGNOSIS — M545 Low back pain: Secondary | ICD-10-CM | POA: Diagnosis not present

## 2018-02-08 DIAGNOSIS — M2560 Stiffness of unspecified joint, not elsewhere classified: Secondary | ICD-10-CM | POA: Diagnosis not present

## 2018-02-08 DIAGNOSIS — M5417 Radiculopathy, lumbosacral region: Secondary | ICD-10-CM | POA: Diagnosis not present

## 2018-02-08 DIAGNOSIS — M6281 Muscle weakness (generalized): Secondary | ICD-10-CM | POA: Diagnosis not present

## 2018-02-08 DIAGNOSIS — M545 Low back pain: Secondary | ICD-10-CM | POA: Diagnosis not present

## 2018-02-15 ENCOUNTER — Ambulatory Visit: Payer: Self-pay | Admitting: Nurse Practitioner

## 2018-02-15 ENCOUNTER — Encounter

## 2018-02-16 DIAGNOSIS — M6281 Muscle weakness (generalized): Secondary | ICD-10-CM | POA: Diagnosis not present

## 2018-02-16 DIAGNOSIS — M5417 Radiculopathy, lumbosacral region: Secondary | ICD-10-CM | POA: Diagnosis not present

## 2018-02-16 DIAGNOSIS — M545 Low back pain: Secondary | ICD-10-CM | POA: Diagnosis not present

## 2018-02-16 DIAGNOSIS — M2560 Stiffness of unspecified joint, not elsewhere classified: Secondary | ICD-10-CM | POA: Diagnosis not present

## 2018-02-19 DIAGNOSIS — M5417 Radiculopathy, lumbosacral region: Secondary | ICD-10-CM | POA: Diagnosis not present

## 2018-02-19 DIAGNOSIS — M2560 Stiffness of unspecified joint, not elsewhere classified: Secondary | ICD-10-CM | POA: Diagnosis not present

## 2018-02-19 DIAGNOSIS — M6281 Muscle weakness (generalized): Secondary | ICD-10-CM | POA: Diagnosis not present

## 2018-02-19 DIAGNOSIS — M545 Low back pain: Secondary | ICD-10-CM | POA: Diagnosis not present

## 2018-02-20 DIAGNOSIS — M25561 Pain in right knee: Secondary | ICD-10-CM | POA: Diagnosis not present

## 2018-02-20 DIAGNOSIS — G894 Chronic pain syndrome: Secondary | ICD-10-CM | POA: Diagnosis not present

## 2018-02-20 DIAGNOSIS — M797 Fibromyalgia: Secondary | ICD-10-CM | POA: Diagnosis not present

## 2018-02-20 DIAGNOSIS — M25562 Pain in left knee: Secondary | ICD-10-CM | POA: Diagnosis not present

## 2018-02-21 DIAGNOSIS — G43711 Chronic migraine without aura, intractable, with status migrainosus: Secondary | ICD-10-CM | POA: Diagnosis not present

## 2018-02-22 ENCOUNTER — Ambulatory Visit: Payer: BLUE CROSS/BLUE SHIELD | Admitting: Neurology

## 2018-02-22 DIAGNOSIS — R110 Nausea: Secondary | ICD-10-CM | POA: Diagnosis not present

## 2018-02-22 DIAGNOSIS — R079 Chest pain, unspecified: Secondary | ICD-10-CM | POA: Diagnosis not present

## 2018-02-22 DIAGNOSIS — G43109 Migraine with aura, not intractable, without status migrainosus: Secondary | ICD-10-CM | POA: Diagnosis not present

## 2018-02-22 DIAGNOSIS — R1031 Right lower quadrant pain: Secondary | ICD-10-CM | POA: Diagnosis not present

## 2018-02-22 DIAGNOSIS — R300 Dysuria: Secondary | ICD-10-CM | POA: Diagnosis not present

## 2018-02-22 DIAGNOSIS — R1032 Left lower quadrant pain: Secondary | ICD-10-CM | POA: Diagnosis not present

## 2018-02-22 DIAGNOSIS — R509 Fever, unspecified: Secondary | ICD-10-CM | POA: Diagnosis not present

## 2018-02-22 DIAGNOSIS — E2740 Unspecified adrenocortical insufficiency: Secondary | ICD-10-CM | POA: Diagnosis not present

## 2018-02-22 DIAGNOSIS — G35 Multiple sclerosis: Secondary | ICD-10-CM | POA: Insufficient documentation

## 2018-02-22 DIAGNOSIS — R29898 Other symptoms and signs involving the musculoskeletal system: Secondary | ICD-10-CM | POA: Diagnosis not present

## 2018-02-22 DIAGNOSIS — K760 Fatty (change of) liver, not elsewhere classified: Secondary | ICD-10-CM | POA: Diagnosis not present

## 2018-02-22 DIAGNOSIS — Z8659 Personal history of other mental and behavioral disorders: Secondary | ICD-10-CM | POA: Diagnosis not present

## 2018-02-22 DIAGNOSIS — R9431 Abnormal electrocardiogram [ECG] [EKG]: Secondary | ICD-10-CM | POA: Diagnosis not present

## 2018-02-22 DIAGNOSIS — R61 Generalized hyperhidrosis: Secondary | ICD-10-CM | POA: Diagnosis not present

## 2018-02-22 DIAGNOSIS — R0789 Other chest pain: Secondary | ICD-10-CM | POA: Diagnosis not present

## 2018-02-22 DIAGNOSIS — R350 Frequency of micturition: Secondary | ICD-10-CM | POA: Diagnosis not present

## 2018-02-28 DIAGNOSIS — M545 Low back pain: Secondary | ICD-10-CM | POA: Diagnosis not present

## 2018-02-28 DIAGNOSIS — M2560 Stiffness of unspecified joint, not elsewhere classified: Secondary | ICD-10-CM | POA: Diagnosis not present

## 2018-02-28 DIAGNOSIS — M6281 Muscle weakness (generalized): Secondary | ICD-10-CM | POA: Diagnosis not present

## 2018-02-28 DIAGNOSIS — M5417 Radiculopathy, lumbosacral region: Secondary | ICD-10-CM | POA: Diagnosis not present

## 2018-03-01 DIAGNOSIS — M2560 Stiffness of unspecified joint, not elsewhere classified: Secondary | ICD-10-CM | POA: Diagnosis not present

## 2018-03-01 DIAGNOSIS — M5417 Radiculopathy, lumbosacral region: Secondary | ICD-10-CM | POA: Diagnosis not present

## 2018-03-01 DIAGNOSIS — M6281 Muscle weakness (generalized): Secondary | ICD-10-CM | POA: Diagnosis not present

## 2018-03-01 DIAGNOSIS — M545 Low back pain: Secondary | ICD-10-CM | POA: Diagnosis not present

## 2018-03-02 DIAGNOSIS — E2740 Unspecified adrenocortical insufficiency: Secondary | ICD-10-CM | POA: Diagnosis not present

## 2018-03-02 DIAGNOSIS — K760 Fatty (change of) liver, not elsewhere classified: Secondary | ICD-10-CM | POA: Diagnosis not present

## 2018-03-05 ENCOUNTER — Telehealth: Payer: Self-pay | Admitting: Neurology

## 2018-03-05 DIAGNOSIS — R55 Syncope and collapse: Secondary | ICD-10-CM | POA: Diagnosis not present

## 2018-03-05 DIAGNOSIS — R0789 Other chest pain: Secondary | ICD-10-CM | POA: Diagnosis not present

## 2018-03-05 DIAGNOSIS — R9431 Abnormal electrocardiogram [ECG] [EKG]: Secondary | ICD-10-CM | POA: Insufficient documentation

## 2018-03-05 DIAGNOSIS — R079 Chest pain, unspecified: Secondary | ICD-10-CM | POA: Diagnosis not present

## 2018-03-05 DIAGNOSIS — E2740 Unspecified adrenocortical insufficiency: Secondary | ICD-10-CM | POA: Diagnosis not present

## 2018-03-05 NOTE — Telephone Encounter (Signed)
Pt has asked to be called to schedule her next Botox appointment.  Please call

## 2018-03-06 NOTE — Telephone Encounter (Signed)
I called and scheduled the patient.  °

## 2018-03-09 DIAGNOSIS — M2391 Unspecified internal derangement of right knee: Secondary | ICD-10-CM | POA: Diagnosis not present

## 2018-03-09 DIAGNOSIS — M2392 Unspecified internal derangement of left knee: Secondary | ICD-10-CM | POA: Diagnosis not present

## 2018-03-09 DIAGNOSIS — M5416 Radiculopathy, lumbar region: Secondary | ICD-10-CM | POA: Diagnosis not present

## 2018-03-12 DIAGNOSIS — R079 Chest pain, unspecified: Secondary | ICD-10-CM | POA: Diagnosis not present

## 2018-03-12 DIAGNOSIS — R55 Syncope and collapse: Secondary | ICD-10-CM | POA: Diagnosis not present

## 2018-03-14 DIAGNOSIS — G43709 Chronic migraine without aura, not intractable, without status migrainosus: Secondary | ICD-10-CM | POA: Diagnosis not present

## 2018-03-14 DIAGNOSIS — M1990 Unspecified osteoarthritis, unspecified site: Secondary | ICD-10-CM | POA: Diagnosis not present

## 2018-03-19 DIAGNOSIS — R55 Syncope and collapse: Secondary | ICD-10-CM | POA: Diagnosis not present

## 2018-03-19 DIAGNOSIS — R079 Chest pain, unspecified: Secondary | ICD-10-CM | POA: Diagnosis not present

## 2018-03-20 DIAGNOSIS — M1712 Unilateral primary osteoarthritis, left knee: Secondary | ICD-10-CM | POA: Diagnosis not present

## 2018-03-20 DIAGNOSIS — M25562 Pain in left knee: Secondary | ICD-10-CM | POA: Diagnosis not present

## 2018-03-20 DIAGNOSIS — M7989 Other specified soft tissue disorders: Secondary | ICD-10-CM | POA: Diagnosis not present

## 2018-03-20 DIAGNOSIS — M2392 Unspecified internal derangement of left knee: Secondary | ICD-10-CM | POA: Diagnosis not present

## 2018-03-23 DIAGNOSIS — M5416 Radiculopathy, lumbar region: Secondary | ICD-10-CM | POA: Diagnosis not present

## 2018-03-23 DIAGNOSIS — M2391 Unspecified internal derangement of right knee: Secondary | ICD-10-CM | POA: Diagnosis not present

## 2018-03-23 DIAGNOSIS — M2392 Unspecified internal derangement of left knee: Secondary | ICD-10-CM | POA: Diagnosis not present

## 2018-03-27 ENCOUNTER — Telehealth: Payer: Self-pay | Admitting: Neurology

## 2018-03-27 NOTE — Telephone Encounter (Addendum)
Spoke with patient. She stated that she was given Emgality in the office once. She has been regularly taking Ajovy every 30 days. Previously she tried Aimovig but it made her sick so she stopped it. She is unsure when she was given the [facility] and could not tell which one worked better. RN advised pt that she should only be on one medication from that class and she can continue Ajovy since that is prescribed to her currently. She verbalized understanding. RN asked about an MS diagnosis. She stated that in either March or April she had a bad fall at home and a CT was performed. A doctor (she cannot remember who) stated there were lesions and she was diagnosed with MS. She was also told that she was having seizures when she fell. Pt has upcoming Botox appt but needs to r/s. RN advised will send message to [facility] for this. Pt wants to discuss Zomig at appt.   D/c Emgality in chart to avoid confusion since she is on Ajovy regularly.

## 2018-03-27 NOTE — Addendum Note (Signed)
Addended by: Gildardo Griffes on: 03/27/2018 02:21 PM   Modules accepted: Orders

## 2018-03-27 NOTE — Telephone Encounter (Signed)
Please call patient, I read her chart from [facility] and it states she takes emgality and ajovy. She should NOT be taking these 2 medications concurrently as they are in the same class of medication, only one should taken. Also can you ask who diagnosed her with MS at [facility]? I don;t see any diagnosis of MS and she reported to the [place] was now diagnosed.  thanks

## 2018-03-28 NOTE — Telephone Encounter (Signed)
I called the patient to reschedule her injection apt but she did not answer so I left a VM asking her to call me back.

## 2018-04-03 DIAGNOSIS — M2560 Stiffness of unspecified joint, not elsewhere classified: Secondary | ICD-10-CM | POA: Diagnosis not present

## 2018-04-03 DIAGNOSIS — M791 Myalgia, unspecified site: Secondary | ICD-10-CM | POA: Diagnosis not present

## 2018-04-03 DIAGNOSIS — M5481 Occipital neuralgia: Secondary | ICD-10-CM | POA: Diagnosis not present

## 2018-04-03 DIAGNOSIS — M545 Low back pain: Secondary | ICD-10-CM | POA: Diagnosis not present

## 2018-04-03 DIAGNOSIS — M6281 Muscle weakness (generalized): Secondary | ICD-10-CM | POA: Diagnosis not present

## 2018-04-03 DIAGNOSIS — M5417 Radiculopathy, lumbosacral region: Secondary | ICD-10-CM | POA: Diagnosis not present

## 2018-04-03 DIAGNOSIS — R51 Headache: Secondary | ICD-10-CM | POA: Diagnosis not present

## 2018-04-03 DIAGNOSIS — M542 Cervicalgia: Secondary | ICD-10-CM | POA: Diagnosis not present

## 2018-04-03 IMAGING — CR DG HIP (WITH OR WITHOUT PELVIS) 2-3V*L*
3 series · 3 of 3 positions shown · non-contrast
Comparison: None.

CLINICAL DATA: 41-year-old female with fall and left hip pain.

EXAM:
DG HIP (WITH OR WITHOUT PELVIS) 2-3V LEFT

[w hip lat left]
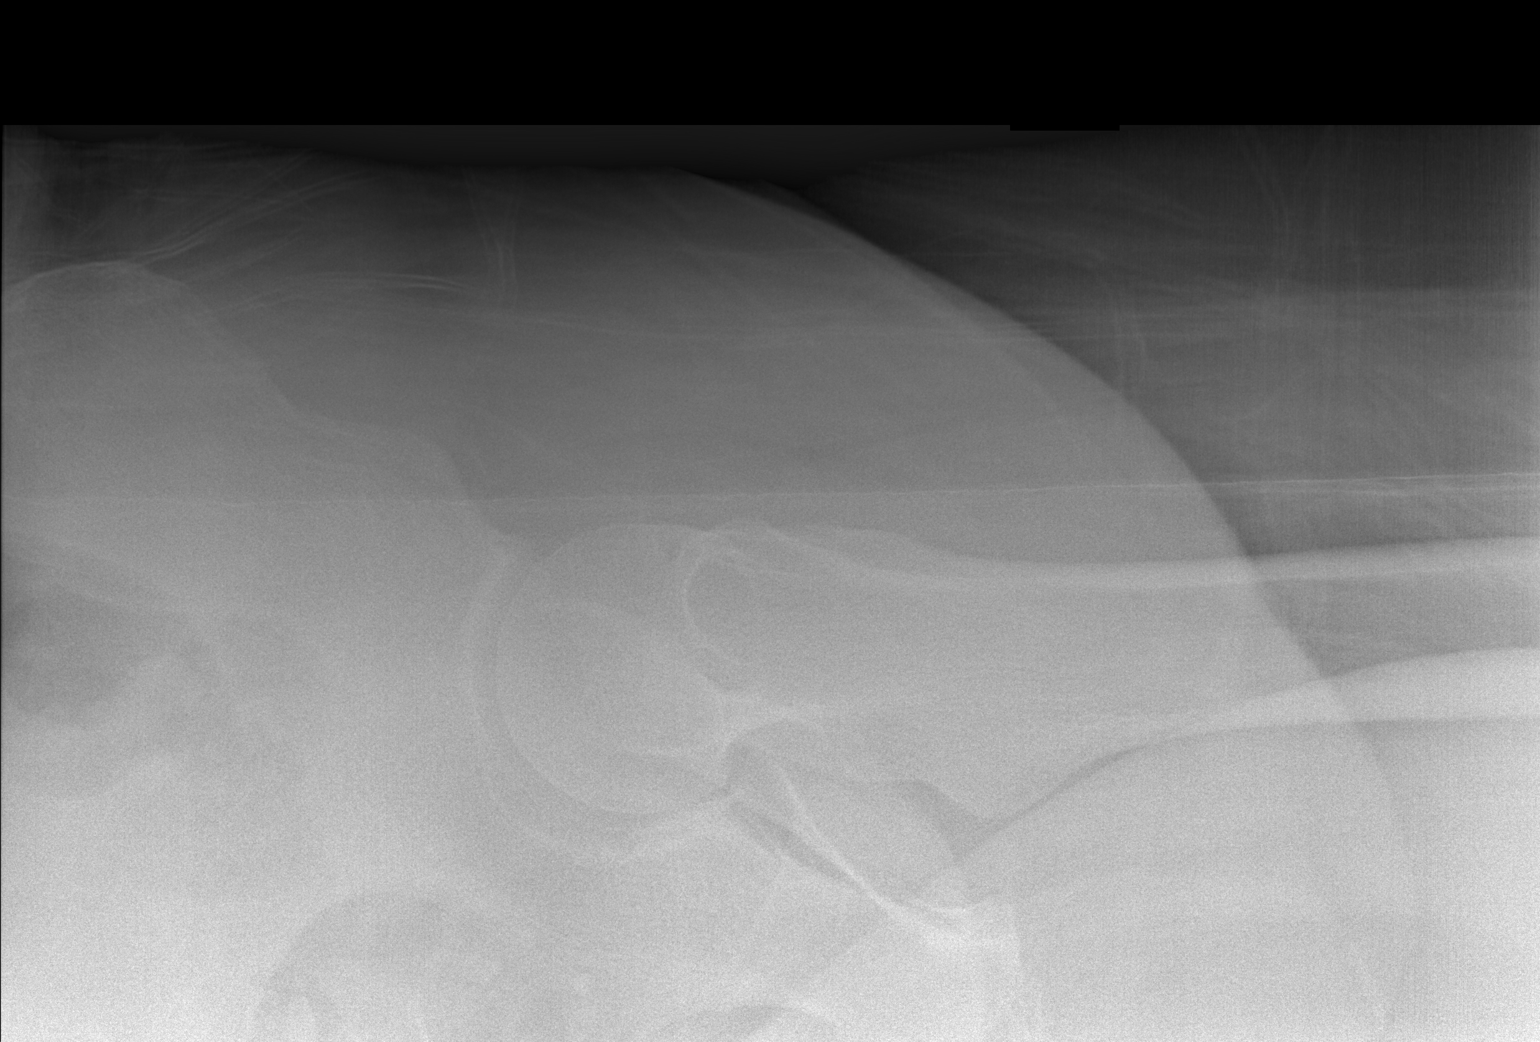

[x pelvis]
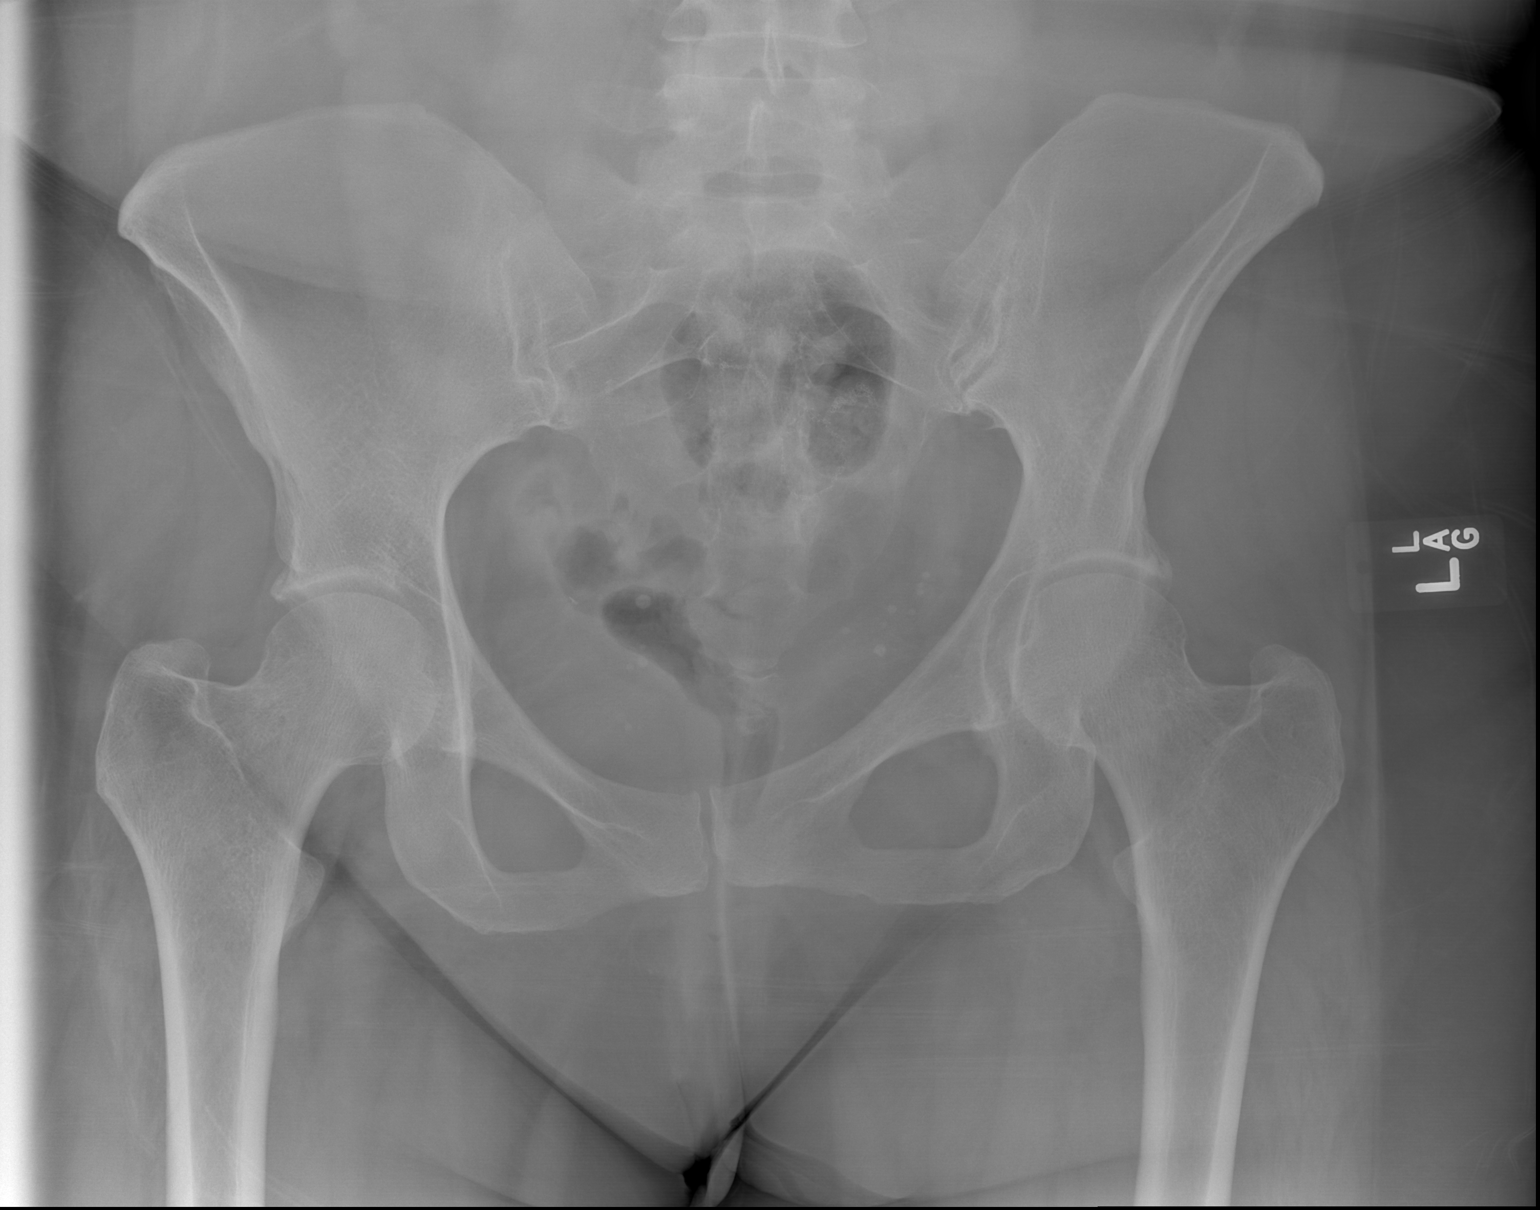

[x hip ap left]
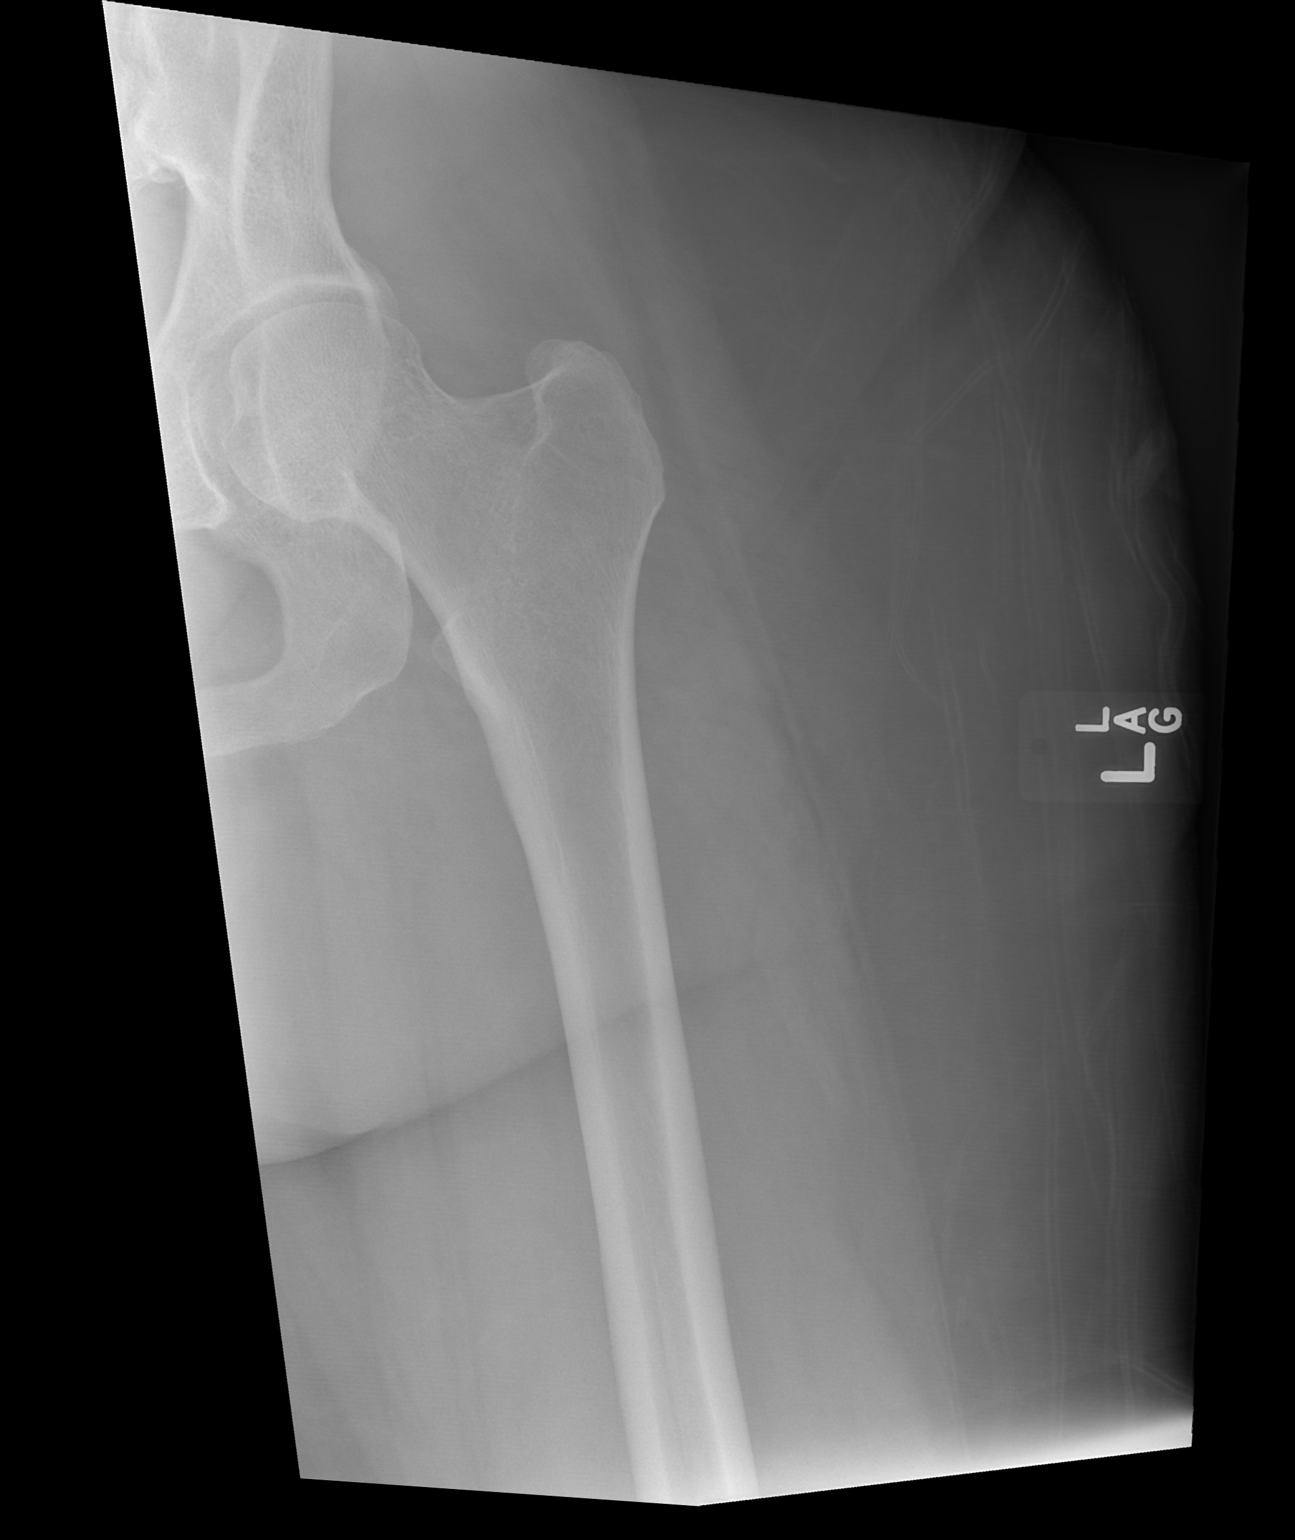

[3 of 3 positions shown; findings below may reference images not displayed]

FINDINGS: There is no evidence of hip fracture or dislocation. There is no
evidence of arthropathy or other focal bone abnormality.
IMPRESSION: Negative.

## 2018-04-09 DIAGNOSIS — M5417 Radiculopathy, lumbosacral region: Secondary | ICD-10-CM | POA: Diagnosis not present

## 2018-04-09 DIAGNOSIS — M2560 Stiffness of unspecified joint, not elsewhere classified: Secondary | ICD-10-CM | POA: Diagnosis not present

## 2018-04-09 DIAGNOSIS — M6281 Muscle weakness (generalized): Secondary | ICD-10-CM | POA: Diagnosis not present

## 2018-04-09 DIAGNOSIS — M545 Low back pain: Secondary | ICD-10-CM | POA: Diagnosis not present

## 2018-04-16 ENCOUNTER — Encounter

## 2018-04-16 ENCOUNTER — Ambulatory Visit: Payer: BLUE CROSS/BLUE SHIELD | Admitting: Neurology

## 2018-04-16 ENCOUNTER — Telehealth: Payer: Self-pay | Admitting: Neurology

## 2018-04-16 DIAGNOSIS — M6281 Muscle weakness (generalized): Secondary | ICD-10-CM | POA: Diagnosis not present

## 2018-04-16 DIAGNOSIS — M545 Low back pain: Secondary | ICD-10-CM | POA: Diagnosis not present

## 2018-04-16 DIAGNOSIS — M2560 Stiffness of unspecified joint, not elsewhere classified: Secondary | ICD-10-CM | POA: Diagnosis not present

## 2018-04-16 DIAGNOSIS — M5417 Radiculopathy, lumbosacral region: Secondary | ICD-10-CM | POA: Diagnosis not present

## 2018-04-16 NOTE — Telephone Encounter (Signed)
Pt called to cancel her botox due to being sick, would like a call to discuss coming in for an infusion later this week. Stating she feels the botox has been helping. Please call to advise

## 2018-04-17 ENCOUNTER — Encounter: Payer: Self-pay | Admitting: Neurology

## 2018-04-19 DIAGNOSIS — F4310 Post-traumatic stress disorder, unspecified: Secondary | ICD-10-CM | POA: Diagnosis not present

## 2018-04-20 DIAGNOSIS — M2392 Unspecified internal derangement of left knee: Secondary | ICD-10-CM | POA: Diagnosis not present

## 2018-05-02 DIAGNOSIS — I10 Essential (primary) hypertension: Secondary | ICD-10-CM | POA: Diagnosis not present

## 2018-05-02 DIAGNOSIS — Z79899 Other long term (current) drug therapy: Secondary | ICD-10-CM | POA: Diagnosis not present

## 2018-05-02 DIAGNOSIS — Z883 Allergy status to other anti-infective agents status: Secondary | ICD-10-CM | POA: Diagnosis not present

## 2018-05-02 DIAGNOSIS — K219 Gastro-esophageal reflux disease without esophagitis: Secondary | ICD-10-CM | POA: Diagnosis not present

## 2018-05-02 DIAGNOSIS — S83012A Lateral subluxation of left patella, initial encounter: Secondary | ICD-10-CM | POA: Diagnosis not present

## 2018-05-02 DIAGNOSIS — M2242 Chondromalacia patellae, left knee: Secondary | ICD-10-CM | POA: Diagnosis not present

## 2018-05-02 DIAGNOSIS — G8918 Other acute postprocedural pain: Secondary | ICD-10-CM | POA: Diagnosis not present

## 2018-05-02 DIAGNOSIS — X58XXXA Exposure to other specified factors, initial encounter: Secondary | ICD-10-CM | POA: Diagnosis not present

## 2018-05-02 DIAGNOSIS — Z880 Allergy status to penicillin: Secondary | ICD-10-CM | POA: Diagnosis not present

## 2018-05-11 DIAGNOSIS — M2392 Unspecified internal derangement of left knee: Secondary | ICD-10-CM | POA: Diagnosis not present

## 2018-05-15 DIAGNOSIS — M25562 Pain in left knee: Secondary | ICD-10-CM | POA: Diagnosis not present

## 2018-05-15 DIAGNOSIS — M25561 Pain in right knee: Secondary | ICD-10-CM | POA: Diagnosis not present

## 2018-05-15 DIAGNOSIS — M797 Fibromyalgia: Secondary | ICD-10-CM | POA: Diagnosis not present

## 2018-05-15 DIAGNOSIS — G894 Chronic pain syndrome: Secondary | ICD-10-CM | POA: Diagnosis not present

## 2018-05-16 DIAGNOSIS — M545 Low back pain: Secondary | ICD-10-CM | POA: Diagnosis not present

## 2018-05-16 DIAGNOSIS — M5417 Radiculopathy, lumbosacral region: Secondary | ICD-10-CM | POA: Diagnosis not present

## 2018-05-16 DIAGNOSIS — M2560 Stiffness of unspecified joint, not elsewhere classified: Secondary | ICD-10-CM | POA: Diagnosis not present

## 2018-05-16 DIAGNOSIS — M2392 Unspecified internal derangement of left knee: Secondary | ICD-10-CM | POA: Diagnosis not present

## 2018-05-16 DIAGNOSIS — M6281 Muscle weakness (generalized): Secondary | ICD-10-CM | POA: Diagnosis not present

## 2018-05-18 DIAGNOSIS — Z4789 Encounter for other orthopedic aftercare: Secondary | ICD-10-CM | POA: Diagnosis not present

## 2018-05-18 DIAGNOSIS — M5416 Radiculopathy, lumbar region: Secondary | ICD-10-CM | POA: Diagnosis not present

## 2018-05-18 DIAGNOSIS — M2391 Unspecified internal derangement of right knee: Secondary | ICD-10-CM | POA: Diagnosis not present

## 2018-05-25 DIAGNOSIS — F4310 Post-traumatic stress disorder, unspecified: Secondary | ICD-10-CM | POA: Diagnosis not present

## 2018-05-28 DIAGNOSIS — R7303 Prediabetes: Secondary | ICD-10-CM | POA: Diagnosis not present

## 2018-05-28 DIAGNOSIS — E2740 Unspecified adrenocortical insufficiency: Secondary | ICD-10-CM | POA: Diagnosis not present

## 2018-05-30 DIAGNOSIS — M2560 Stiffness of unspecified joint, not elsewhere classified: Secondary | ICD-10-CM | POA: Diagnosis not present

## 2018-05-30 DIAGNOSIS — M5417 Radiculopathy, lumbosacral region: Secondary | ICD-10-CM | POA: Diagnosis not present

## 2018-05-30 DIAGNOSIS — M545 Low back pain: Secondary | ICD-10-CM | POA: Diagnosis not present

## 2018-05-30 DIAGNOSIS — M6281 Muscle weakness (generalized): Secondary | ICD-10-CM | POA: Diagnosis not present

## 2018-05-31 DIAGNOSIS — M5481 Occipital neuralgia: Secondary | ICD-10-CM | POA: Diagnosis not present

## 2018-05-31 DIAGNOSIS — M542 Cervicalgia: Secondary | ICD-10-CM | POA: Diagnosis not present

## 2018-05-31 DIAGNOSIS — R51 Headache: Secondary | ICD-10-CM | POA: Diagnosis not present

## 2018-05-31 DIAGNOSIS — M791 Myalgia, unspecified site: Secondary | ICD-10-CM | POA: Diagnosis not present

## 2018-06-05 ENCOUNTER — Other Ambulatory Visit: Payer: Self-pay | Admitting: Neurology

## 2018-06-12 DIAGNOSIS — G894 Chronic pain syndrome: Secondary | ICD-10-CM | POA: Diagnosis not present

## 2018-06-12 DIAGNOSIS — M25561 Pain in right knee: Secondary | ICD-10-CM | POA: Diagnosis not present

## 2018-06-12 DIAGNOSIS — M25562 Pain in left knee: Secondary | ICD-10-CM | POA: Diagnosis not present

## 2018-06-12 DIAGNOSIS — M797 Fibromyalgia: Secondary | ICD-10-CM | POA: Diagnosis not present

## 2018-06-13 DIAGNOSIS — M6281 Muscle weakness (generalized): Secondary | ICD-10-CM | POA: Diagnosis not present

## 2018-06-13 DIAGNOSIS — Z4789 Encounter for other orthopedic aftercare: Secondary | ICD-10-CM | POA: Diagnosis not present

## 2018-06-13 DIAGNOSIS — M2560 Stiffness of unspecified joint, not elsewhere classified: Secondary | ICD-10-CM | POA: Diagnosis not present

## 2018-06-13 DIAGNOSIS — J329 Chronic sinusitis, unspecified: Secondary | ICD-10-CM | POA: Diagnosis not present

## 2018-06-13 DIAGNOSIS — M5417 Radiculopathy, lumbosacral region: Secondary | ICD-10-CM | POA: Diagnosis not present

## 2018-06-13 DIAGNOSIS — M545 Low back pain: Secondary | ICD-10-CM | POA: Diagnosis not present

## 2018-06-13 DIAGNOSIS — Z23 Encounter for immunization: Secondary | ICD-10-CM | POA: Diagnosis not present

## 2018-06-13 DIAGNOSIS — Z8781 Personal history of (healed) traumatic fracture: Secondary | ICD-10-CM | POA: Diagnosis not present

## 2018-06-14 DIAGNOSIS — K219 Gastro-esophageal reflux disease without esophagitis: Secondary | ICD-10-CM | POA: Diagnosis not present

## 2018-06-14 DIAGNOSIS — G43809 Other migraine, not intractable, without status migrainosus: Secondary | ICD-10-CM | POA: Diagnosis not present

## 2018-06-14 DIAGNOSIS — I10 Essential (primary) hypertension: Secondary | ICD-10-CM | POA: Diagnosis not present

## 2018-06-25 DIAGNOSIS — M5417 Radiculopathy, lumbosacral region: Secondary | ICD-10-CM | POA: Diagnosis not present

## 2018-06-25 DIAGNOSIS — M2560 Stiffness of unspecified joint, not elsewhere classified: Secondary | ICD-10-CM | POA: Diagnosis not present

## 2018-06-25 DIAGNOSIS — M545 Low back pain: Secondary | ICD-10-CM | POA: Diagnosis not present

## 2018-06-25 DIAGNOSIS — M6281 Muscle weakness (generalized): Secondary | ICD-10-CM | POA: Diagnosis not present

## 2018-06-28 DIAGNOSIS — F331 Major depressive disorder, recurrent, moderate: Secondary | ICD-10-CM | POA: Diagnosis not present

## 2018-06-28 DIAGNOSIS — G4700 Insomnia, unspecified: Secondary | ICD-10-CM | POA: Diagnosis not present

## 2018-06-28 DIAGNOSIS — F4310 Post-traumatic stress disorder, unspecified: Secondary | ICD-10-CM | POA: Diagnosis not present

## 2018-06-29 DIAGNOSIS — M2560 Stiffness of unspecified joint, not elsewhere classified: Secondary | ICD-10-CM | POA: Diagnosis not present

## 2018-06-29 DIAGNOSIS — M6281 Muscle weakness (generalized): Secondary | ICD-10-CM | POA: Diagnosis not present

## 2018-06-29 DIAGNOSIS — M545 Low back pain: Secondary | ICD-10-CM | POA: Diagnosis not present

## 2018-06-29 DIAGNOSIS — M5417 Radiculopathy, lumbosacral region: Secondary | ICD-10-CM | POA: Diagnosis not present

## 2018-07-05 DIAGNOSIS — K760 Fatty (change of) liver, not elsewhere classified: Secondary | ICD-10-CM | POA: Diagnosis not present

## 2018-07-05 DIAGNOSIS — Z6841 Body Mass Index (BMI) 40.0 and over, adult: Secondary | ICD-10-CM | POA: Diagnosis not present

## 2018-07-05 DIAGNOSIS — G4733 Obstructive sleep apnea (adult) (pediatric): Secondary | ICD-10-CM | POA: Diagnosis not present

## 2018-07-09 DIAGNOSIS — F4310 Post-traumatic stress disorder, unspecified: Secondary | ICD-10-CM | POA: Diagnosis not present

## 2018-07-10 DIAGNOSIS — Z4789 Encounter for other orthopedic aftercare: Secondary | ICD-10-CM | POA: Diagnosis not present

## 2018-07-10 DIAGNOSIS — M1712 Unilateral primary osteoarthritis, left knee: Secondary | ICD-10-CM | POA: Diagnosis not present

## 2018-07-10 DIAGNOSIS — M545 Low back pain: Secondary | ICD-10-CM | POA: Diagnosis not present

## 2018-07-10 DIAGNOSIS — M6281 Muscle weakness (generalized): Secondary | ICD-10-CM | POA: Diagnosis not present

## 2018-07-10 DIAGNOSIS — M2560 Stiffness of unspecified joint, not elsewhere classified: Secondary | ICD-10-CM | POA: Diagnosis not present

## 2018-07-10 DIAGNOSIS — M5417 Radiculopathy, lumbosacral region: Secondary | ICD-10-CM | POA: Diagnosis not present

## 2018-07-13 DIAGNOSIS — M5416 Radiculopathy, lumbar region: Secondary | ICD-10-CM | POA: Diagnosis not present

## 2018-07-13 DIAGNOSIS — Z4789 Encounter for other orthopedic aftercare: Secondary | ICD-10-CM | POA: Diagnosis not present

## 2018-07-13 DIAGNOSIS — M2391 Unspecified internal derangement of right knee: Secondary | ICD-10-CM | POA: Diagnosis not present

## 2018-07-16 ENCOUNTER — Other Ambulatory Visit: Payer: Self-pay | Admitting: Neurology

## 2018-07-16 DIAGNOSIS — F4310 Post-traumatic stress disorder, unspecified: Secondary | ICD-10-CM | POA: Diagnosis not present

## 2018-07-16 DIAGNOSIS — M6281 Muscle weakness (generalized): Secondary | ICD-10-CM | POA: Diagnosis not present

## 2018-07-16 DIAGNOSIS — M545 Low back pain: Secondary | ICD-10-CM | POA: Diagnosis not present

## 2018-07-16 DIAGNOSIS — M5417 Radiculopathy, lumbosacral region: Secondary | ICD-10-CM | POA: Diagnosis not present

## 2018-07-16 DIAGNOSIS — M2560 Stiffness of unspecified joint, not elsewhere classified: Secondary | ICD-10-CM | POA: Diagnosis not present

## 2018-07-17 DIAGNOSIS — R197 Diarrhea, unspecified: Secondary | ICD-10-CM | POA: Diagnosis not present

## 2018-07-17 DIAGNOSIS — Z7689 Persons encountering health services in other specified circumstances: Secondary | ICD-10-CM | POA: Diagnosis not present

## 2018-07-17 DIAGNOSIS — K760 Fatty (change of) liver, not elsewhere classified: Secondary | ICD-10-CM | POA: Diagnosis not present

## 2018-07-17 DIAGNOSIS — Z9889 Other specified postprocedural states: Secondary | ICD-10-CM | POA: Diagnosis not present

## 2018-07-18 DIAGNOSIS — M5417 Radiculopathy, lumbosacral region: Secondary | ICD-10-CM | POA: Diagnosis not present

## 2018-07-18 DIAGNOSIS — Z23 Encounter for immunization: Secondary | ICD-10-CM | POA: Diagnosis not present

## 2018-07-18 DIAGNOSIS — M2560 Stiffness of unspecified joint, not elsewhere classified: Secondary | ICD-10-CM | POA: Diagnosis not present

## 2018-07-18 DIAGNOSIS — M6281 Muscle weakness (generalized): Secondary | ICD-10-CM | POA: Diagnosis not present

## 2018-07-18 DIAGNOSIS — M545 Low back pain: Secondary | ICD-10-CM | POA: Diagnosis not present

## 2018-07-18 DIAGNOSIS — G35 Multiple sclerosis: Secondary | ICD-10-CM | POA: Diagnosis not present

## 2018-07-19 DIAGNOSIS — G43709 Chronic migraine without aura, not intractable, without status migrainosus: Secondary | ICD-10-CM | POA: Diagnosis not present

## 2018-07-20 DIAGNOSIS — M76891 Other specified enthesopathies of right lower limb, excluding foot: Secondary | ICD-10-CM | POA: Diagnosis not present

## 2018-07-20 DIAGNOSIS — R936 Abnormal findings on diagnostic imaging of limbs: Secondary | ICD-10-CM | POA: Diagnosis not present

## 2018-07-20 DIAGNOSIS — M2241 Chondromalacia patellae, right knee: Secondary | ICD-10-CM | POA: Diagnosis not present

## 2018-07-20 DIAGNOSIS — R7303 Prediabetes: Secondary | ICD-10-CM | POA: Diagnosis not present

## 2018-07-20 DIAGNOSIS — M7651 Patellar tendinitis, right knee: Secondary | ICD-10-CM | POA: Diagnosis not present

## 2018-07-25 DIAGNOSIS — G4730 Sleep apnea, unspecified: Secondary | ICD-10-CM | POA: Diagnosis not present

## 2018-07-27 DIAGNOSIS — M2391 Unspecified internal derangement of right knee: Secondary | ICD-10-CM | POA: Diagnosis not present

## 2018-07-27 DIAGNOSIS — M5416 Radiculopathy, lumbar region: Secondary | ICD-10-CM | POA: Diagnosis not present

## 2018-08-06 DIAGNOSIS — G4730 Sleep apnea, unspecified: Secondary | ICD-10-CM | POA: Diagnosis not present

## 2018-08-07 DIAGNOSIS — M797 Fibromyalgia: Secondary | ICD-10-CM | POA: Diagnosis not present

## 2018-08-07 DIAGNOSIS — M25562 Pain in left knee: Secondary | ICD-10-CM | POA: Diagnosis not present

## 2018-08-07 DIAGNOSIS — G894 Chronic pain syndrome: Secondary | ICD-10-CM | POA: Diagnosis not present

## 2018-08-07 DIAGNOSIS — M25561 Pain in right knee: Secondary | ICD-10-CM | POA: Diagnosis not present

## 2018-08-07 DIAGNOSIS — R0789 Other chest pain: Secondary | ICD-10-CM | POA: Diagnosis not present

## 2018-08-08 DIAGNOSIS — R0609 Other forms of dyspnea: Secondary | ICD-10-CM | POA: Diagnosis not present

## 2018-08-08 DIAGNOSIS — F331 Major depressive disorder, recurrent, moderate: Secondary | ICD-10-CM | POA: Diagnosis not present

## 2018-08-08 DIAGNOSIS — G4700 Insomnia, unspecified: Secondary | ICD-10-CM | POA: Diagnosis not present

## 2018-08-08 DIAGNOSIS — F4310 Post-traumatic stress disorder, unspecified: Secondary | ICD-10-CM | POA: Diagnosis not present

## 2018-08-09 DIAGNOSIS — F4310 Post-traumatic stress disorder, unspecified: Secondary | ICD-10-CM | POA: Diagnosis not present

## 2018-08-15 DIAGNOSIS — Z6841 Body Mass Index (BMI) 40.0 and over, adult: Secondary | ICD-10-CM | POA: Diagnosis not present

## 2018-08-15 DIAGNOSIS — Z01818 Encounter for other preprocedural examination: Secondary | ICD-10-CM | POA: Diagnosis not present

## 2018-08-20 DIAGNOSIS — R197 Diarrhea, unspecified: Secondary | ICD-10-CM | POA: Diagnosis not present

## 2018-08-20 DIAGNOSIS — Z9889 Other specified postprocedural states: Secondary | ICD-10-CM | POA: Diagnosis not present

## 2018-08-20 DIAGNOSIS — Z01818 Encounter for other preprocedural examination: Secondary | ICD-10-CM | POA: Diagnosis not present

## 2018-08-20 DIAGNOSIS — Z6839 Body mass index (BMI) 39.0-39.9, adult: Secondary | ICD-10-CM | POA: Diagnosis not present

## 2018-08-20 DIAGNOSIS — Z713 Dietary counseling and surveillance: Secondary | ICD-10-CM | POA: Diagnosis not present

## 2018-08-20 DIAGNOSIS — G43711 Chronic migraine without aura, intractable, with status migrainosus: Secondary | ICD-10-CM | POA: Diagnosis not present

## 2018-08-23 DIAGNOSIS — E2740 Unspecified adrenocortical insufficiency: Secondary | ICD-10-CM | POA: Diagnosis not present

## 2018-08-23 DIAGNOSIS — Z01812 Encounter for preprocedural laboratory examination: Secondary | ICD-10-CM | POA: Diagnosis not present

## 2018-08-23 DIAGNOSIS — Z01818 Encounter for other preprocedural examination: Secondary | ICD-10-CM | POA: Diagnosis not present

## 2018-08-27 DIAGNOSIS — E2740 Unspecified adrenocortical insufficiency: Secondary | ICD-10-CM | POA: Diagnosis not present

## 2018-09-03 DIAGNOSIS — E2740 Unspecified adrenocortical insufficiency: Secondary | ICD-10-CM | POA: Diagnosis not present

## 2018-09-07 DIAGNOSIS — M2391 Unspecified internal derangement of right knee: Secondary | ICD-10-CM | POA: Diagnosis not present

## 2018-09-10 DIAGNOSIS — E669 Obesity, unspecified: Secondary | ICD-10-CM | POA: Diagnosis not present

## 2018-09-10 DIAGNOSIS — Z6839 Body mass index (BMI) 39.0-39.9, adult: Secondary | ICD-10-CM | POA: Diagnosis not present

## 2018-09-10 DIAGNOSIS — F329 Major depressive disorder, single episode, unspecified: Secondary | ICD-10-CM | POA: Diagnosis not present

## 2018-09-10 DIAGNOSIS — F419 Anxiety disorder, unspecified: Secondary | ICD-10-CM | POA: Diagnosis not present

## 2018-09-10 DIAGNOSIS — G8918 Other acute postprocedural pain: Secondary | ICD-10-CM | POA: Diagnosis not present

## 2018-09-10 DIAGNOSIS — M2391 Unspecified internal derangement of right knee: Secondary | ICD-10-CM | POA: Diagnosis not present

## 2018-09-10 DIAGNOSIS — Z79899 Other long term (current) drug therapy: Secondary | ICD-10-CM | POA: Diagnosis not present

## 2018-09-10 DIAGNOSIS — M2241 Chondromalacia patellae, right knee: Secondary | ICD-10-CM | POA: Diagnosis not present

## 2018-09-10 DIAGNOSIS — M1990 Unspecified osteoarthritis, unspecified site: Secondary | ICD-10-CM | POA: Diagnosis not present

## 2018-09-10 DIAGNOSIS — G35 Multiple sclerosis: Secondary | ICD-10-CM | POA: Diagnosis not present

## 2018-09-10 DIAGNOSIS — G43909 Migraine, unspecified, not intractable, without status migrainosus: Secondary | ICD-10-CM | POA: Diagnosis not present

## 2018-09-10 DIAGNOSIS — M222X1 Patellofemoral disorders, right knee: Secondary | ICD-10-CM | POA: Diagnosis not present

## 2018-09-10 DIAGNOSIS — M25562 Pain in left knee: Secondary | ICD-10-CM | POA: Diagnosis not present

## 2018-09-16 ENCOUNTER — Other Ambulatory Visit: Payer: Self-pay | Admitting: Neurology

## 2018-10-01 DIAGNOSIS — Z981 Arthrodesis status: Secondary | ICD-10-CM | POA: Diagnosis not present

## 2018-10-01 DIAGNOSIS — Z4789 Encounter for other orthopedic aftercare: Secondary | ICD-10-CM | POA: Diagnosis not present

## 2018-10-05 DIAGNOSIS — M5416 Radiculopathy, lumbar region: Secondary | ICD-10-CM | POA: Diagnosis not present

## 2018-10-05 DIAGNOSIS — Z4789 Encounter for other orthopedic aftercare: Secondary | ICD-10-CM | POA: Diagnosis not present

## 2018-10-08 DIAGNOSIS — R7303 Prediabetes: Secondary | ICD-10-CM | POA: Insufficient documentation

## 2018-10-08 DIAGNOSIS — F4310 Post-traumatic stress disorder, unspecified: Secondary | ICD-10-CM | POA: Diagnosis not present

## 2018-10-08 DIAGNOSIS — E2740 Unspecified adrenocortical insufficiency: Secondary | ICD-10-CM | POA: Diagnosis not present

## 2018-10-16 DIAGNOSIS — R51 Headache: Secondary | ICD-10-CM | POA: Diagnosis not present

## 2018-10-16 DIAGNOSIS — M791 Myalgia, unspecified site: Secondary | ICD-10-CM | POA: Diagnosis not present

## 2018-10-16 DIAGNOSIS — G894 Chronic pain syndrome: Secondary | ICD-10-CM | POA: Diagnosis not present

## 2018-10-16 DIAGNOSIS — M797 Fibromyalgia: Secondary | ICD-10-CM | POA: Diagnosis not present

## 2018-10-16 DIAGNOSIS — M542 Cervicalgia: Secondary | ICD-10-CM | POA: Diagnosis not present

## 2018-10-16 DIAGNOSIS — M5481 Occipital neuralgia: Secondary | ICD-10-CM | POA: Diagnosis not present

## 2018-10-16 DIAGNOSIS — M533 Sacrococcygeal disorders, not elsewhere classified: Secondary | ICD-10-CM | POA: Diagnosis not present

## 2018-10-16 DIAGNOSIS — M25562 Pain in left knee: Secondary | ICD-10-CM | POA: Diagnosis not present

## 2018-10-16 DIAGNOSIS — M25561 Pain in right knee: Secondary | ICD-10-CM | POA: Diagnosis not present

## 2018-10-26 DIAGNOSIS — Z9889 Other specified postprocedural states: Secondary | ICD-10-CM | POA: Diagnosis not present

## 2018-10-26 DIAGNOSIS — R197 Diarrhea, unspecified: Secondary | ICD-10-CM | POA: Diagnosis not present

## 2018-10-29 DIAGNOSIS — F4310 Post-traumatic stress disorder, unspecified: Secondary | ICD-10-CM | POA: Diagnosis not present

## 2018-10-30 DIAGNOSIS — Z713 Dietary counseling and surveillance: Secondary | ICD-10-CM | POA: Diagnosis not present

## 2018-10-30 DIAGNOSIS — Z6838 Body mass index (BMI) 38.0-38.9, adult: Secondary | ICD-10-CM | POA: Diagnosis not present

## 2018-10-30 DIAGNOSIS — F4310 Post-traumatic stress disorder, unspecified: Secondary | ICD-10-CM | POA: Diagnosis not present

## 2018-10-30 DIAGNOSIS — F331 Major depressive disorder, recurrent, moderate: Secondary | ICD-10-CM | POA: Diagnosis not present

## 2018-10-30 DIAGNOSIS — G4700 Insomnia, unspecified: Secondary | ICD-10-CM | POA: Diagnosis not present

## 2018-11-01 DIAGNOSIS — M1711 Unilateral primary osteoarthritis, right knee: Secondary | ICD-10-CM | POA: Diagnosis not present

## 2018-11-01 DIAGNOSIS — Z9889 Other specified postprocedural states: Secondary | ICD-10-CM | POA: Diagnosis not present

## 2018-11-02 DIAGNOSIS — M5416 Radiculopathy, lumbar region: Secondary | ICD-10-CM | POA: Diagnosis not present

## 2018-11-02 DIAGNOSIS — Z4789 Encounter for other orthopedic aftercare: Secondary | ICD-10-CM | POA: Diagnosis not present

## 2018-11-02 DIAGNOSIS — T8484XD Pain due to internal orthopedic prosthetic devices, implants and grafts, subsequent encounter: Secondary | ICD-10-CM | POA: Diagnosis not present

## 2018-11-07 DIAGNOSIS — F4310 Post-traumatic stress disorder, unspecified: Secondary | ICD-10-CM | POA: Diagnosis not present

## 2018-11-07 DIAGNOSIS — I444 Left anterior fascicular block: Secondary | ICD-10-CM | POA: Diagnosis not present

## 2018-11-07 DIAGNOSIS — R7989 Other specified abnormal findings of blood chemistry: Secondary | ICD-10-CM | POA: Diagnosis not present

## 2018-11-07 DIAGNOSIS — Z01811 Encounter for preprocedural respiratory examination: Secondary | ICD-10-CM | POA: Diagnosis not present

## 2018-11-07 DIAGNOSIS — Z7689 Persons encountering health services in other specified circumstances: Secondary | ICD-10-CM | POA: Diagnosis not present

## 2018-11-07 DIAGNOSIS — K219 Gastro-esophageal reflux disease without esophagitis: Secondary | ICD-10-CM | POA: Diagnosis not present

## 2018-11-07 DIAGNOSIS — Z01810 Encounter for preprocedural cardiovascular examination: Secondary | ICD-10-CM | POA: Diagnosis not present

## 2018-11-07 DIAGNOSIS — R9431 Abnormal electrocardiogram [ECG] [EKG]: Secondary | ICD-10-CM | POA: Diagnosis not present

## 2018-11-07 DIAGNOSIS — Z6830 Body mass index (BMI) 30.0-30.9, adult: Secondary | ICD-10-CM | POA: Diagnosis not present

## 2018-11-07 DIAGNOSIS — Z01818 Encounter for other preprocedural examination: Secondary | ICD-10-CM | POA: Diagnosis not present

## 2018-11-07 DIAGNOSIS — Z6837 Body mass index (BMI) 37.0-37.9, adult: Secondary | ICD-10-CM | POA: Diagnosis not present

## 2018-11-07 DIAGNOSIS — E611 Iron deficiency: Secondary | ICD-10-CM | POA: Diagnosis not present

## 2018-11-09 DIAGNOSIS — M5126 Other intervertebral disc displacement, lumbar region: Secondary | ICD-10-CM | POA: Diagnosis not present

## 2018-11-09 DIAGNOSIS — M5127 Other intervertebral disc displacement, lumbosacral region: Secondary | ICD-10-CM | POA: Diagnosis not present

## 2018-11-09 DIAGNOSIS — M47816 Spondylosis without myelopathy or radiculopathy, lumbar region: Secondary | ICD-10-CM | POA: Diagnosis not present

## 2018-11-09 DIAGNOSIS — M5416 Radiculopathy, lumbar region: Secondary | ICD-10-CM | POA: Diagnosis not present

## 2018-11-12 DIAGNOSIS — Z6838 Body mass index (BMI) 38.0-38.9, adult: Secondary | ICD-10-CM | POA: Diagnosis not present

## 2018-11-12 DIAGNOSIS — E8881 Metabolic syndrome: Secondary | ICD-10-CM | POA: Insufficient documentation

## 2018-11-12 DIAGNOSIS — R0609 Other forms of dyspnea: Secondary | ICD-10-CM | POA: Diagnosis not present

## 2018-11-13 DIAGNOSIS — M5441 Lumbago with sciatica, right side: Secondary | ICD-10-CM | POA: Diagnosis not present

## 2018-11-13 DIAGNOSIS — G894 Chronic pain syndrome: Secondary | ICD-10-CM | POA: Diagnosis not present

## 2018-11-13 DIAGNOSIS — M797 Fibromyalgia: Secondary | ICD-10-CM | POA: Diagnosis not present

## 2018-11-13 DIAGNOSIS — R140 Abdominal distension (gaseous): Secondary | ICD-10-CM | POA: Insufficient documentation

## 2018-11-13 DIAGNOSIS — M25562 Pain in left knee: Secondary | ICD-10-CM | POA: Diagnosis not present

## 2018-11-13 DIAGNOSIS — Z9889 Other specified postprocedural states: Secondary | ICD-10-CM | POA: Diagnosis not present

## 2018-11-13 DIAGNOSIS — M25561 Pain in right knee: Secondary | ICD-10-CM | POA: Diagnosis not present

## 2018-11-13 DIAGNOSIS — R197 Diarrhea, unspecified: Secondary | ICD-10-CM | POA: Diagnosis not present

## 2018-11-19 DIAGNOSIS — F4310 Post-traumatic stress disorder, unspecified: Secondary | ICD-10-CM | POA: Diagnosis not present

## 2018-11-26 DIAGNOSIS — M25561 Pain in right knee: Secondary | ICD-10-CM | POA: Diagnosis not present

## 2018-11-26 DIAGNOSIS — F4310 Post-traumatic stress disorder, unspecified: Secondary | ICD-10-CM | POA: Diagnosis not present

## 2018-11-30 DIAGNOSIS — Z4789 Encounter for other orthopedic aftercare: Secondary | ICD-10-CM | POA: Diagnosis not present

## 2018-11-30 DIAGNOSIS — M5416 Radiculopathy, lumbar region: Secondary | ICD-10-CM | POA: Diagnosis not present

## 2018-11-30 DIAGNOSIS — T8484XD Pain due to internal orthopedic prosthetic devices, implants and grafts, subsequent encounter: Secondary | ICD-10-CM | POA: Diagnosis not present

## 2018-12-07 DIAGNOSIS — F4310 Post-traumatic stress disorder, unspecified: Secondary | ICD-10-CM | POA: Diagnosis not present

## 2018-12-07 DIAGNOSIS — G4700 Insomnia, unspecified: Secondary | ICD-10-CM | POA: Diagnosis not present

## 2018-12-07 DIAGNOSIS — F331 Major depressive disorder, recurrent, moderate: Secondary | ICD-10-CM | POA: Diagnosis not present

## 2018-12-11 DIAGNOSIS — M797 Fibromyalgia: Secondary | ICD-10-CM | POA: Diagnosis not present

## 2018-12-11 DIAGNOSIS — M5441 Lumbago with sciatica, right side: Secondary | ICD-10-CM | POA: Diagnosis not present

## 2018-12-11 DIAGNOSIS — R262 Difficulty in walking, not elsewhere classified: Secondary | ICD-10-CM | POA: Diagnosis not present

## 2018-12-11 DIAGNOSIS — M25562 Pain in left knee: Secondary | ICD-10-CM | POA: Diagnosis not present

## 2018-12-11 DIAGNOSIS — G894 Chronic pain syndrome: Secondary | ICD-10-CM | POA: Diagnosis not present

## 2018-12-11 DIAGNOSIS — M25461 Effusion, right knee: Secondary | ICD-10-CM | POA: Diagnosis not present

## 2018-12-11 DIAGNOSIS — M25561 Pain in right knee: Secondary | ICD-10-CM | POA: Diagnosis not present

## 2018-12-17 ENCOUNTER — Other Ambulatory Visit: Payer: Self-pay | Admitting: Physician Assistant

## 2018-12-17 DIAGNOSIS — Z5329 Procedure and treatment not carried out because of patient's decision for other reasons: Secondary | ICD-10-CM | POA: Diagnosis not present

## 2018-12-17 DIAGNOSIS — Z1231 Encounter for screening mammogram for malignant neoplasm of breast: Secondary | ICD-10-CM

## 2018-12-19 DIAGNOSIS — L738 Other specified follicular disorders: Secondary | ICD-10-CM | POA: Diagnosis not present

## 2018-12-19 DIAGNOSIS — L820 Inflamed seborrheic keratosis: Secondary | ICD-10-CM | POA: Diagnosis not present

## 2018-12-19 DIAGNOSIS — D2362 Other benign neoplasm of skin of left upper limb, including shoulder: Secondary | ICD-10-CM | POA: Diagnosis not present

## 2018-12-19 DIAGNOSIS — L905 Scar conditions and fibrosis of skin: Secondary | ICD-10-CM | POA: Diagnosis not present

## 2018-12-20 DIAGNOSIS — M7138 Other bursal cyst, other site: Secondary | ICD-10-CM | POA: Diagnosis not present

## 2018-12-20 DIAGNOSIS — G894 Chronic pain syndrome: Secondary | ICD-10-CM | POA: Diagnosis not present

## 2019-01-01 DIAGNOSIS — F331 Major depressive disorder, recurrent, moderate: Secondary | ICD-10-CM | POA: Diagnosis not present

## 2019-01-08 DIAGNOSIS — F4310 Post-traumatic stress disorder, unspecified: Secondary | ICD-10-CM | POA: Diagnosis not present

## 2019-01-10 DIAGNOSIS — M25561 Pain in right knee: Secondary | ICD-10-CM | POA: Diagnosis not present

## 2019-01-10 DIAGNOSIS — M797 Fibromyalgia: Secondary | ICD-10-CM | POA: Diagnosis not present

## 2019-01-10 DIAGNOSIS — G894 Chronic pain syndrome: Secondary | ICD-10-CM | POA: Diagnosis not present

## 2019-01-10 DIAGNOSIS — M25562 Pain in left knee: Secondary | ICD-10-CM | POA: Diagnosis not present

## 2019-01-10 DIAGNOSIS — M542 Cervicalgia: Secondary | ICD-10-CM | POA: Diagnosis not present

## 2019-01-10 DIAGNOSIS — M1711 Unilateral primary osteoarthritis, right knee: Secondary | ICD-10-CM | POA: Diagnosis not present

## 2019-01-10 DIAGNOSIS — F4310 Post-traumatic stress disorder, unspecified: Secondary | ICD-10-CM | POA: Diagnosis not present

## 2019-01-11 DIAGNOSIS — M5416 Radiculopathy, lumbar region: Secondary | ICD-10-CM | POA: Diagnosis not present

## 2019-01-11 DIAGNOSIS — T8484XD Pain due to internal orthopedic prosthetic devices, implants and grafts, subsequent encounter: Secondary | ICD-10-CM | POA: Diagnosis not present

## 2019-01-11 DIAGNOSIS — M9689 Other intraoperative and postprocedural complications and disorders of the musculoskeletal system: Secondary | ICD-10-CM | POA: Diagnosis not present

## 2019-01-15 DIAGNOSIS — F4310 Post-traumatic stress disorder, unspecified: Secondary | ICD-10-CM | POA: Diagnosis not present

## 2019-01-21 DIAGNOSIS — L237 Allergic contact dermatitis due to plants, except food: Secondary | ICD-10-CM | POA: Diagnosis not present

## 2019-01-25 DIAGNOSIS — R419 Unspecified symptoms and signs involving cognitive functions and awareness: Secondary | ICD-10-CM | POA: Diagnosis not present

## 2019-01-25 DIAGNOSIS — F331 Major depressive disorder, recurrent, moderate: Secondary | ICD-10-CM | POA: Diagnosis not present

## 2019-01-25 DIAGNOSIS — G4700 Insomnia, unspecified: Secondary | ICD-10-CM | POA: Diagnosis not present

## 2019-01-25 DIAGNOSIS — F4310 Post-traumatic stress disorder, unspecified: Secondary | ICD-10-CM | POA: Diagnosis not present

## 2019-01-29 ENCOUNTER — Ambulatory Visit: Payer: BLUE CROSS/BLUE SHIELD

## 2019-02-05 DIAGNOSIS — F4310 Post-traumatic stress disorder, unspecified: Secondary | ICD-10-CM | POA: Diagnosis not present

## 2019-02-07 DIAGNOSIS — M791 Myalgia, unspecified site: Secondary | ICD-10-CM | POA: Diagnosis not present

## 2019-02-07 DIAGNOSIS — M5481 Occipital neuralgia: Secondary | ICD-10-CM | POA: Diagnosis not present

## 2019-02-07 DIAGNOSIS — M797 Fibromyalgia: Secondary | ICD-10-CM | POA: Diagnosis not present

## 2019-02-07 DIAGNOSIS — M542 Cervicalgia: Secondary | ICD-10-CM | POA: Diagnosis not present

## 2019-02-07 DIAGNOSIS — Z4789 Encounter for other orthopedic aftercare: Secondary | ICD-10-CM | POA: Diagnosis not present

## 2019-02-07 DIAGNOSIS — R0789 Other chest pain: Secondary | ICD-10-CM | POA: Diagnosis not present

## 2019-02-07 DIAGNOSIS — G894 Chronic pain syndrome: Secondary | ICD-10-CM | POA: Diagnosis not present

## 2019-02-07 DIAGNOSIS — R51 Headache: Secondary | ICD-10-CM | POA: Diagnosis not present

## 2019-02-07 DIAGNOSIS — M533 Sacrococcygeal disorders, not elsewhere classified: Secondary | ICD-10-CM | POA: Diagnosis not present

## 2019-02-07 DIAGNOSIS — M1711 Unilateral primary osteoarthritis, right knee: Secondary | ICD-10-CM | POA: Diagnosis not present

## 2019-02-07 DIAGNOSIS — Z9889 Other specified postprocedural states: Secondary | ICD-10-CM | POA: Diagnosis not present

## 2019-02-08 DIAGNOSIS — M5416 Radiculopathy, lumbar region: Secondary | ICD-10-CM | POA: Diagnosis not present

## 2019-02-08 DIAGNOSIS — M9689 Other intraoperative and postprocedural complications and disorders of the musculoskeletal system: Secondary | ICD-10-CM | POA: Diagnosis not present

## 2019-02-08 DIAGNOSIS — T8484XD Pain due to internal orthopedic prosthetic devices, implants and grafts, subsequent encounter: Secondary | ICD-10-CM | POA: Diagnosis not present

## 2019-02-11 DIAGNOSIS — G4700 Insomnia, unspecified: Secondary | ICD-10-CM | POA: Diagnosis not present

## 2019-02-11 DIAGNOSIS — F331 Major depressive disorder, recurrent, moderate: Secondary | ICD-10-CM | POA: Diagnosis not present

## 2019-02-11 DIAGNOSIS — R419 Unspecified symptoms and signs involving cognitive functions and awareness: Secondary | ICD-10-CM | POA: Diagnosis not present

## 2019-02-11 DIAGNOSIS — F4310 Post-traumatic stress disorder, unspecified: Secondary | ICD-10-CM | POA: Diagnosis not present

## 2019-02-12 ENCOUNTER — Telehealth: Payer: Self-pay | Admitting: Neurology

## 2019-02-12 DIAGNOSIS — F4310 Post-traumatic stress disorder, unspecified: Secondary | ICD-10-CM | POA: Diagnosis not present

## 2019-02-12 DIAGNOSIS — G4700 Insomnia, unspecified: Secondary | ICD-10-CM | POA: Diagnosis not present

## 2019-02-12 NOTE — Telephone Encounter (Signed)
Denise Avila, the last time I spoke to patient I discussed that I don't think that I could help her and I recommended her to someone else and also to an academic center like [facility] or [facility] if Dr. Sima Matas couldn't help.   I am not willing to take her back as a patient because I have no further treat,ent for her and I think her care would be best served at an academic center. She already follows with [facility] for other issues so we can refer her there. I actually referred her out of the practice in February 2019 and discussed this with her. Since she has had multiple botox injections without improvement, insurance will no longer approve botox and I cannot administer it to her. Also, due to Covid-19, we are not providing IV infusions (she has had multiple in the past), not offering frequent in-office appointments or procedures so she would have to wait a few months to see me anyway, office procedures are changing since she was last here. I feel her care would be better served elsewhere, I don't think I have anything else for her. Thanks

## 2019-02-13 NOTE — Telephone Encounter (Signed)
I called and explained to the patient that per previous notes the Botox was not effective for her and that our office is not offering infusions right now. I also told her Dr. Jaynee Eagles was willing to send referral to [facility] if she would like that. She would like a referral to be sent to [facility].

## 2019-02-13 NOTE — Telephone Encounter (Signed)
Dr. Jaynee Eagles you can place referral and I will get her sent to Dr. Karene Fry.

## 2019-02-13 NOTE — Telephone Encounter (Signed)
Pt has called asking for the reason as to why Dr Jaynee Eagles has referred her to someone else.  Pt would like a call explaining how she can become a pt of Dr Jaynee Eagles again.  Please call

## 2019-02-14 ENCOUNTER — Other Ambulatory Visit: Payer: Self-pay | Admitting: Neurology

## 2019-02-14 DIAGNOSIS — G43711 Chronic migraine without aura, intractable, with status migrainosus: Secondary | ICD-10-CM

## 2019-02-14 NOTE — Telephone Encounter (Signed)
Referral placed for [facility] (NOT Dr. Sima Matas, she already seen her thanks)

## 2019-02-19 DIAGNOSIS — F4310 Post-traumatic stress disorder, unspecified: Secondary | ICD-10-CM | POA: Diagnosis not present

## 2019-02-26 DIAGNOSIS — F4310 Post-traumatic stress disorder, unspecified: Secondary | ICD-10-CM | POA: Diagnosis not present

## 2019-02-28 DIAGNOSIS — M533 Sacrococcygeal disorders, not elsewhere classified: Secondary | ICD-10-CM | POA: Diagnosis not present

## 2019-02-28 DIAGNOSIS — G894 Chronic pain syndrome: Secondary | ICD-10-CM | POA: Diagnosis not present

## 2019-02-28 DIAGNOSIS — M797 Fibromyalgia: Secondary | ICD-10-CM | POA: Diagnosis not present

## 2019-02-28 DIAGNOSIS — R0789 Other chest pain: Secondary | ICD-10-CM | POA: Diagnosis not present

## 2019-03-04 DIAGNOSIS — K760 Fatty (change of) liver, not elsewhere classified: Secondary | ICD-10-CM | POA: Diagnosis not present

## 2019-03-04 DIAGNOSIS — M1711 Unilateral primary osteoarthritis, right knee: Secondary | ICD-10-CM | POA: Diagnosis not present

## 2019-03-05 DIAGNOSIS — G35 Multiple sclerosis: Secondary | ICD-10-CM | POA: Diagnosis not present

## 2019-03-05 DIAGNOSIS — E2740 Unspecified adrenocortical insufficiency: Secondary | ICD-10-CM | POA: Diagnosis not present

## 2019-03-05 DIAGNOSIS — Z6836 Body mass index (BMI) 36.0-36.9, adult: Secondary | ICD-10-CM | POA: Diagnosis not present

## 2019-03-05 DIAGNOSIS — E559 Vitamin D deficiency, unspecified: Secondary | ICD-10-CM | POA: Diagnosis not present

## 2019-03-05 DIAGNOSIS — Z79899 Other long term (current) drug therapy: Secondary | ICD-10-CM | POA: Diagnosis not present

## 2019-03-05 DIAGNOSIS — Z01818 Encounter for other preprocedural examination: Secondary | ICD-10-CM | POA: Diagnosis not present

## 2019-03-05 DIAGNOSIS — R9431 Abnormal electrocardiogram [ECG] [EKG]: Secondary | ICD-10-CM | POA: Diagnosis not present

## 2019-03-05 DIAGNOSIS — R7303 Prediabetes: Secondary | ICD-10-CM | POA: Diagnosis not present

## 2019-03-05 DIAGNOSIS — I951 Orthostatic hypotension: Secondary | ICD-10-CM | POA: Diagnosis not present

## 2019-03-05 DIAGNOSIS — K760 Fatty (change of) liver, not elsewhere classified: Secondary | ICD-10-CM | POA: Diagnosis not present

## 2019-03-05 DIAGNOSIS — R748 Abnormal levels of other serum enzymes: Secondary | ICD-10-CM | POA: Diagnosis not present

## 2019-03-05 DIAGNOSIS — Z9049 Acquired absence of other specified parts of digestive tract: Secondary | ICD-10-CM | POA: Diagnosis not present

## 2019-03-05 DIAGNOSIS — Z7952 Long term (current) use of systemic steroids: Secondary | ICD-10-CM | POA: Diagnosis not present

## 2019-03-05 DIAGNOSIS — N200 Calculus of kidney: Secondary | ICD-10-CM | POA: Diagnosis not present

## 2019-03-05 DIAGNOSIS — Z713 Dietary counseling and surveillance: Secondary | ICD-10-CM | POA: Diagnosis not present

## 2019-03-08 DIAGNOSIS — M5416 Radiculopathy, lumbar region: Secondary | ICD-10-CM | POA: Diagnosis not present

## 2019-03-08 DIAGNOSIS — T8484XD Pain due to internal orthopedic prosthetic devices, implants and grafts, subsequent encounter: Secondary | ICD-10-CM | POA: Diagnosis not present

## 2019-03-08 DIAGNOSIS — G5601 Carpal tunnel syndrome, right upper limb: Secondary | ICD-10-CM | POA: Diagnosis not present

## 2019-03-08 DIAGNOSIS — G5602 Carpal tunnel syndrome, left upper limb: Secondary | ICD-10-CM | POA: Diagnosis not present

## 2019-03-11 DIAGNOSIS — Z01818 Encounter for other preprocedural examination: Secondary | ICD-10-CM | POA: Diagnosis not present

## 2019-03-11 DIAGNOSIS — Z1159 Encounter for screening for other viral diseases: Secondary | ICD-10-CM | POA: Diagnosis not present

## 2019-03-14 DIAGNOSIS — N200 Calculus of kidney: Secondary | ICD-10-CM | POA: Diagnosis not present

## 2019-03-14 DIAGNOSIS — E282 Polycystic ovarian syndrome: Secondary | ICD-10-CM | POA: Diagnosis not present

## 2019-03-14 DIAGNOSIS — E8881 Metabolic syndrome: Secondary | ICD-10-CM | POA: Diagnosis not present

## 2019-03-14 DIAGNOSIS — G43909 Migraine, unspecified, not intractable, without status migrainosus: Secondary | ICD-10-CM | POA: Diagnosis not present

## 2019-03-14 DIAGNOSIS — G35 Multiple sclerosis: Secondary | ICD-10-CM | POA: Diagnosis not present

## 2019-03-14 DIAGNOSIS — K224 Dyskinesia of esophagus: Secondary | ICD-10-CM | POA: Diagnosis not present

## 2019-03-14 DIAGNOSIS — F339 Major depressive disorder, recurrent, unspecified: Secondary | ICD-10-CM | POA: Diagnosis not present

## 2019-03-14 DIAGNOSIS — Z01818 Encounter for other preprocedural examination: Secondary | ICD-10-CM | POA: Diagnosis not present

## 2019-03-14 DIAGNOSIS — Z9889 Other specified postprocedural states: Secondary | ICD-10-CM | POA: Diagnosis not present

## 2019-03-14 DIAGNOSIS — K567 Ileus, unspecified: Secondary | ICD-10-CM | POA: Diagnosis not present

## 2019-03-14 DIAGNOSIS — R7303 Prediabetes: Secondary | ICD-10-CM | POA: Diagnosis not present

## 2019-03-14 DIAGNOSIS — K219 Gastro-esophageal reflux disease without esophagitis: Secondary | ICD-10-CM | POA: Diagnosis not present

## 2019-03-14 DIAGNOSIS — R7309 Other abnormal glucose: Secondary | ICD-10-CM | POA: Diagnosis not present

## 2019-03-14 DIAGNOSIS — E46 Unspecified protein-calorie malnutrition: Secondary | ICD-10-CM | POA: Diagnosis not present

## 2019-03-14 DIAGNOSIS — Z880 Allergy status to penicillin: Secondary | ICD-10-CM | POA: Diagnosis not present

## 2019-03-14 DIAGNOSIS — Z6837 Body mass index (BMI) 37.0-37.9, adult: Secondary | ICD-10-CM | POA: Diagnosis not present

## 2019-03-14 DIAGNOSIS — F4310 Post-traumatic stress disorder, unspecified: Secondary | ICD-10-CM | POA: Diagnosis not present

## 2019-03-14 DIAGNOSIS — R1312 Dysphagia, oropharyngeal phase: Secondary | ICD-10-CM | POA: Diagnosis not present

## 2019-03-14 DIAGNOSIS — Z6836 Body mass index (BMI) 36.0-36.9, adult: Secondary | ICD-10-CM | POA: Diagnosis not present

## 2019-03-14 DIAGNOSIS — Z1159 Encounter for screening for other viral diseases: Secondary | ICD-10-CM | POA: Diagnosis not present

## 2019-03-14 DIAGNOSIS — K760 Fatty (change of) liver, not elsewhere classified: Secondary | ICD-10-CM | POA: Diagnosis not present

## 2019-03-14 DIAGNOSIS — F331 Major depressive disorder, recurrent, moderate: Secondary | ICD-10-CM | POA: Diagnosis not present

## 2019-03-14 DIAGNOSIS — I444 Left anterior fascicular block: Secondary | ICD-10-CM | POA: Diagnosis not present

## 2019-03-14 DIAGNOSIS — R110 Nausea: Secondary | ICD-10-CM | POA: Diagnosis not present

## 2019-03-14 DIAGNOSIS — R1031 Right lower quadrant pain: Secondary | ICD-10-CM | POA: Diagnosis not present

## 2019-03-14 DIAGNOSIS — R140 Abdominal distension (gaseous): Secondary | ICD-10-CM | POA: Diagnosis not present

## 2019-03-15 ENCOUNTER — Ambulatory Visit: Payer: BLUE CROSS/BLUE SHIELD

## 2019-03-18 DIAGNOSIS — K224 Dyskinesia of esophagus: Secondary | ICD-10-CM | POA: Diagnosis not present

## 2019-03-18 DIAGNOSIS — R110 Nausea: Secondary | ICD-10-CM | POA: Diagnosis not present

## 2019-03-18 DIAGNOSIS — K219 Gastro-esophageal reflux disease without esophagitis: Secondary | ICD-10-CM | POA: Diagnosis not present

## 2019-03-18 DIAGNOSIS — K567 Ileus, unspecified: Secondary | ICD-10-CM | POA: Diagnosis not present

## 2019-03-22 DIAGNOSIS — R110 Nausea: Secondary | ICD-10-CM | POA: Diagnosis not present

## 2019-03-22 DIAGNOSIS — K760 Fatty (change of) liver, not elsewhere classified: Secondary | ICD-10-CM | POA: Diagnosis not present

## 2019-03-22 DIAGNOSIS — R7303 Prediabetes: Secondary | ICD-10-CM | POA: Diagnosis not present

## 2019-03-22 DIAGNOSIS — N200 Calculus of kidney: Secondary | ICD-10-CM | POA: Diagnosis not present

## 2019-03-22 DIAGNOSIS — Z01818 Encounter for other preprocedural examination: Secondary | ICD-10-CM | POA: Diagnosis not present

## 2019-03-23 DIAGNOSIS — R110 Nausea: Secondary | ICD-10-CM | POA: Diagnosis not present

## 2019-03-23 DIAGNOSIS — F331 Major depressive disorder, recurrent, moderate: Secondary | ICD-10-CM | POA: Insufficient documentation

## 2019-03-23 DIAGNOSIS — Z9889 Other specified postprocedural states: Secondary | ICD-10-CM | POA: Diagnosis not present

## 2019-03-25 DIAGNOSIS — F331 Major depressive disorder, recurrent, moderate: Secondary | ICD-10-CM | POA: Diagnosis not present

## 2019-03-25 DIAGNOSIS — R110 Nausea: Secondary | ICD-10-CM | POA: Diagnosis not present

## 2019-03-25 DIAGNOSIS — Z9889 Other specified postprocedural states: Secondary | ICD-10-CM | POA: Diagnosis not present

## 2019-03-26 DIAGNOSIS — K567 Ileus, unspecified: Secondary | ICD-10-CM | POA: Diagnosis not present

## 2019-03-26 DIAGNOSIS — R1312 Dysphagia, oropharyngeal phase: Secondary | ICD-10-CM | POA: Diagnosis not present

## 2019-03-26 DIAGNOSIS — F331 Major depressive disorder, recurrent, moderate: Secondary | ICD-10-CM | POA: Diagnosis not present

## 2019-03-26 DIAGNOSIS — R110 Nausea: Secondary | ICD-10-CM | POA: Diagnosis not present

## 2019-03-26 DIAGNOSIS — Z9889 Other specified postprocedural states: Secondary | ICD-10-CM | POA: Diagnosis not present

## 2019-03-26 DIAGNOSIS — R1031 Right lower quadrant pain: Secondary | ICD-10-CM | POA: Diagnosis not present

## 2019-03-26 DIAGNOSIS — R140 Abdominal distension (gaseous): Secondary | ICD-10-CM | POA: Diagnosis not present

## 2019-03-27 DIAGNOSIS — E46 Unspecified protein-calorie malnutrition: Secondary | ICD-10-CM | POA: Diagnosis not present

## 2019-03-27 DIAGNOSIS — Z01818 Encounter for other preprocedural examination: Secondary | ICD-10-CM | POA: Diagnosis not present

## 2019-04-02 DIAGNOSIS — E46 Unspecified protein-calorie malnutrition: Secondary | ICD-10-CM | POA: Diagnosis not present

## 2019-04-03 DIAGNOSIS — E46 Unspecified protein-calorie malnutrition: Secondary | ICD-10-CM | POA: Diagnosis not present

## 2019-04-09 DIAGNOSIS — Z48815 Encounter for surgical aftercare following surgery on the digestive system: Secondary | ICD-10-CM | POA: Diagnosis not present

## 2019-04-09 DIAGNOSIS — Z79899 Other long term (current) drug therapy: Secondary | ICD-10-CM | POA: Diagnosis not present

## 2019-04-09 DIAGNOSIS — Z713 Dietary counseling and surveillance: Secondary | ICD-10-CM | POA: Diagnosis not present

## 2019-04-09 DIAGNOSIS — F4310 Post-traumatic stress disorder, unspecified: Secondary | ICD-10-CM | POA: Diagnosis not present

## 2019-04-09 DIAGNOSIS — R110 Nausea: Secondary | ICD-10-CM | POA: Diagnosis not present

## 2019-04-09 DIAGNOSIS — Z9884 Bariatric surgery status: Secondary | ICD-10-CM | POA: Diagnosis not present

## 2019-04-09 DIAGNOSIS — Z6835 Body mass index (BMI) 35.0-35.9, adult: Secondary | ICD-10-CM | POA: Diagnosis not present

## 2019-04-10 DIAGNOSIS — E46 Unspecified protein-calorie malnutrition: Secondary | ICD-10-CM | POA: Diagnosis not present

## 2019-04-10 DIAGNOSIS — F331 Major depressive disorder, recurrent, moderate: Secondary | ICD-10-CM | POA: Diagnosis not present

## 2019-04-16 DIAGNOSIS — Z9884 Bariatric surgery status: Secondary | ICD-10-CM | POA: Diagnosis not present

## 2019-04-16 DIAGNOSIS — F4310 Post-traumatic stress disorder, unspecified: Secondary | ICD-10-CM | POA: Diagnosis not present

## 2019-04-16 DIAGNOSIS — Z48815 Encounter for surgical aftercare following surgery on the digestive system: Secondary | ICD-10-CM | POA: Diagnosis not present

## 2019-04-16 DIAGNOSIS — Z452 Encounter for adjustment and management of vascular access device: Secondary | ICD-10-CM | POA: Diagnosis not present

## 2019-04-26 DIAGNOSIS — F4310 Post-traumatic stress disorder, unspecified: Secondary | ICD-10-CM | POA: Diagnosis not present

## 2019-04-30 DIAGNOSIS — F4310 Post-traumatic stress disorder, unspecified: Secondary | ICD-10-CM | POA: Diagnosis not present

## 2019-04-30 DIAGNOSIS — G894 Chronic pain syndrome: Secondary | ICD-10-CM | POA: Diagnosis not present

## 2019-04-30 DIAGNOSIS — M797 Fibromyalgia: Secondary | ICD-10-CM | POA: Diagnosis not present

## 2019-04-30 DIAGNOSIS — M533 Sacrococcygeal disorders, not elsewhere classified: Secondary | ICD-10-CM | POA: Diagnosis not present

## 2019-04-30 DIAGNOSIS — R0789 Other chest pain: Secondary | ICD-10-CM | POA: Diagnosis not present

## 2019-05-01 ENCOUNTER — Other Ambulatory Visit: Payer: Self-pay

## 2019-05-01 ENCOUNTER — Ambulatory Visit
Admission: RE | Admit: 2019-05-01 | Discharge: 2019-05-01 | Disposition: A | Discharge status: Home / Self Care | Payer: BC Managed Care – PPO | Source: Ambulatory Visit | Attending: Physician Assistant | Admitting: Physician Assistant

## 2019-05-01 DIAGNOSIS — Z1231 Encounter for screening mammogram for malignant neoplasm of breast: Secondary | ICD-10-CM

## 2019-05-01 DIAGNOSIS — M25532 Pain in left wrist: Secondary | ICD-10-CM | POA: Diagnosis not present

## 2019-05-01 DIAGNOSIS — M25531 Pain in right wrist: Secondary | ICD-10-CM | POA: Diagnosis not present

## 2019-05-01 DIAGNOSIS — R202 Paresthesia of skin: Secondary | ICD-10-CM | POA: Diagnosis not present

## 2019-05-03 DIAGNOSIS — G5602 Carpal tunnel syndrome, left upper limb: Secondary | ICD-10-CM | POA: Diagnosis not present

## 2019-05-03 DIAGNOSIS — G5601 Carpal tunnel syndrome, right upper limb: Secondary | ICD-10-CM | POA: Diagnosis not present

## 2019-05-03 DIAGNOSIS — M5416 Radiculopathy, lumbar region: Secondary | ICD-10-CM | POA: Diagnosis not present

## 2019-05-03 DIAGNOSIS — T8484XD Pain due to internal orthopedic prosthetic devices, implants and grafts, subsequent encounter: Secondary | ICD-10-CM | POA: Diagnosis not present

## 2019-05-14 DIAGNOSIS — K219 Gastro-esophageal reflux disease without esophagitis: Secondary | ICD-10-CM | POA: Insufficient documentation

## 2019-05-14 DIAGNOSIS — Z9889 Other specified postprocedural states: Secondary | ICD-10-CM | POA: Diagnosis not present

## 2019-05-14 DIAGNOSIS — R197 Diarrhea, unspecified: Secondary | ICD-10-CM | POA: Diagnosis not present

## 2019-05-17 DIAGNOSIS — F4310 Post-traumatic stress disorder, unspecified: Secondary | ICD-10-CM | POA: Diagnosis not present

## 2019-05-20 DIAGNOSIS — I951 Orthostatic hypotension: Secondary | ICD-10-CM | POA: Diagnosis not present

## 2019-05-20 DIAGNOSIS — H903 Sensorineural hearing loss, bilateral: Secondary | ICD-10-CM | POA: Diagnosis not present

## 2019-05-20 DIAGNOSIS — R42 Dizziness and giddiness: Secondary | ICD-10-CM | POA: Diagnosis not present

## 2019-05-23 ENCOUNTER — Telehealth: Payer: Self-pay | Admitting: Neurology

## 2019-05-23 NOTE — Telephone Encounter (Signed)
Patient was seen buy Dr. Candis Schatz at [facility] for her Vertigo and Dizziness . Patient needs a referral . Telephone [phone] - fax [phone] . Denise Avila .

## 2019-05-23 NOTE — Telephone Encounter (Signed)
She is scheduled for that apt with Migraine clinic. Patient called and scheduled her apt with Dr. Candis Schatz for vertigo and dizziness and now she needs a referral. I relayed to patient  This may or may not happen because Dr. Jaynee Eagles only sent you to the migraine clinic and that is done and scheduled.

## 2019-05-23 NOTE — Telephone Encounter (Signed)
We referred her back in may can we use that referral?

## 2019-05-23 NOTE — Telephone Encounter (Signed)
Since I am not managing the patient anymore I think the migraine clinic or her pcp should order the referral because the referring doctor gets all the results back and I am not going to see her again so her pcp or migraine clinic should get te results not me. Also,  I saw MS on her diagnoses from the consult I received back. She should address that, as far as I know she has never been diagnosed with MS and keeping it on there will result in inaccurate medical care and likely misdiagnoses. Thanks

## 2019-05-27 NOTE — Telephone Encounter (Signed)
Thank You Dr. Claris Che to patient.

## 2019-06-04 DIAGNOSIS — G43709 Chronic migraine without aura, not intractable, without status migrainosus: Secondary | ICD-10-CM | POA: Diagnosis not present

## 2019-06-05 DIAGNOSIS — F4310 Post-traumatic stress disorder, unspecified: Secondary | ICD-10-CM | POA: Diagnosis not present

## 2019-06-07 DIAGNOSIS — F331 Major depressive disorder, recurrent, moderate: Secondary | ICD-10-CM | POA: Diagnosis not present

## 2019-06-14 DIAGNOSIS — H5213 Myopia, bilateral: Secondary | ICD-10-CM | POA: Diagnosis not present

## 2019-06-14 DIAGNOSIS — G5603 Carpal tunnel syndrome, bilateral upper limbs: Secondary | ICD-10-CM | POA: Diagnosis not present

## 2019-06-14 DIAGNOSIS — H524 Presbyopia: Secondary | ICD-10-CM | POA: Diagnosis not present

## 2019-06-14 DIAGNOSIS — G5602 Carpal tunnel syndrome, left upper limb: Secondary | ICD-10-CM | POA: Diagnosis not present

## 2019-06-14 DIAGNOSIS — T8484XD Pain due to internal orthopedic prosthetic devices, implants and grafts, subsequent encounter: Secondary | ICD-10-CM | POA: Diagnosis not present

## 2019-06-14 DIAGNOSIS — G5601 Carpal tunnel syndrome, right upper limb: Secondary | ICD-10-CM | POA: Diagnosis not present

## 2019-06-21 DIAGNOSIS — R29818 Other symptoms and signs involving the nervous system: Secondary | ICD-10-CM | POA: Diagnosis not present

## 2019-06-25 DIAGNOSIS — M25562 Pain in left knee: Secondary | ICD-10-CM | POA: Diagnosis not present

## 2019-06-25 DIAGNOSIS — M5441 Lumbago with sciatica, right side: Secondary | ICD-10-CM | POA: Diagnosis not present

## 2019-06-25 DIAGNOSIS — M1711 Unilateral primary osteoarthritis, right knee: Secondary | ICD-10-CM | POA: Diagnosis not present

## 2019-06-25 DIAGNOSIS — M797 Fibromyalgia: Secondary | ICD-10-CM | POA: Diagnosis not present

## 2019-06-25 DIAGNOSIS — R0789 Other chest pain: Secondary | ICD-10-CM | POA: Diagnosis not present

## 2019-06-25 DIAGNOSIS — M542 Cervicalgia: Secondary | ICD-10-CM | POA: Diagnosis not present

## 2019-06-25 DIAGNOSIS — G894 Chronic pain syndrome: Secondary | ICD-10-CM | POA: Diagnosis not present

## 2019-06-25 DIAGNOSIS — M533 Sacrococcygeal disorders, not elsewhere classified: Secondary | ICD-10-CM | POA: Diagnosis not present

## 2019-06-25 DIAGNOSIS — Z9889 Other specified postprocedural states: Secondary | ICD-10-CM | POA: Diagnosis not present

## 2019-06-26 DIAGNOSIS — Z9884 Bariatric surgery status: Secondary | ICD-10-CM | POA: Diagnosis not present

## 2019-06-26 DIAGNOSIS — K5900 Constipation, unspecified: Secondary | ICD-10-CM | POA: Diagnosis not present

## 2019-06-26 DIAGNOSIS — K912 Postsurgical malabsorption, not elsewhere classified: Secondary | ICD-10-CM | POA: Diagnosis not present

## 2019-06-26 DIAGNOSIS — K219 Gastro-esophageal reflux disease without esophagitis: Secondary | ICD-10-CM | POA: Diagnosis not present

## 2019-06-26 DIAGNOSIS — Z713 Dietary counseling and surveillance: Secondary | ICD-10-CM | POA: Diagnosis not present

## 2019-06-26 DIAGNOSIS — Z48815 Encounter for surgical aftercare following surgery on the digestive system: Secondary | ICD-10-CM | POA: Diagnosis not present

## 2019-06-26 DIAGNOSIS — Z6831 Body mass index (BMI) 31.0-31.9, adult: Secondary | ICD-10-CM | POA: Diagnosis not present

## 2019-06-28 DIAGNOSIS — M5416 Radiculopathy, lumbar region: Secondary | ICD-10-CM | POA: Diagnosis not present

## 2019-06-28 DIAGNOSIS — T8484XD Pain due to internal orthopedic prosthetic devices, implants and grafts, subsequent encounter: Secondary | ICD-10-CM | POA: Diagnosis not present

## 2019-06-29 DIAGNOSIS — Z1159 Encounter for screening for other viral diseases: Secondary | ICD-10-CM | POA: Diagnosis not present

## 2019-07-03 DIAGNOSIS — M5416 Radiculopathy, lumbar region: Secondary | ICD-10-CM | POA: Diagnosis not present

## 2019-07-03 DIAGNOSIS — Z7952 Long term (current) use of systemic steroids: Secondary | ICD-10-CM | POA: Diagnosis not present

## 2019-07-03 DIAGNOSIS — T8484XA Pain due to internal orthopedic prosthetic devices, implants and grafts, initial encounter: Secondary | ICD-10-CM | POA: Diagnosis not present

## 2019-07-03 DIAGNOSIS — M898X6 Other specified disorders of bone, lower leg: Secondary | ICD-10-CM | POA: Diagnosis not present

## 2019-07-03 DIAGNOSIS — E2740 Unspecified adrenocortical insufficiency: Secondary | ICD-10-CM | POA: Diagnosis not present

## 2019-07-03 DIAGNOSIS — Z87442 Personal history of urinary calculi: Secondary | ICD-10-CM | POA: Diagnosis not present

## 2019-07-03 DIAGNOSIS — M899 Disorder of bone, unspecified: Secondary | ICD-10-CM | POA: Diagnosis not present

## 2019-07-03 DIAGNOSIS — G35 Multiple sclerosis: Secondary | ICD-10-CM | POA: Diagnosis not present

## 2019-07-03 DIAGNOSIS — Z791 Long term (current) use of non-steroidal anti-inflammatories (NSAID): Secondary | ICD-10-CM | POA: Diagnosis not present

## 2019-07-03 DIAGNOSIS — Z79899 Other long term (current) drug therapy: Secondary | ICD-10-CM | POA: Diagnosis not present

## 2019-07-12 DIAGNOSIS — Z4789 Encounter for other orthopedic aftercare: Secondary | ICD-10-CM | POA: Diagnosis not present

## 2019-07-12 DIAGNOSIS — G5601 Carpal tunnel syndrome, right upper limb: Secondary | ICD-10-CM | POA: Diagnosis not present

## 2019-07-12 DIAGNOSIS — G5602 Carpal tunnel syndrome, left upper limb: Secondary | ICD-10-CM | POA: Diagnosis not present

## 2019-07-12 DIAGNOSIS — G5603 Carpal tunnel syndrome, bilateral upper limbs: Secondary | ICD-10-CM | POA: Diagnosis not present

## 2019-07-16 DIAGNOSIS — F322 Major depressive disorder, single episode, severe without psychotic features: Secondary | ICD-10-CM | POA: Diagnosis not present

## 2019-07-16 DIAGNOSIS — G4719 Other hypersomnia: Secondary | ICD-10-CM | POA: Diagnosis not present

## 2019-07-16 DIAGNOSIS — R7309 Other abnormal glucose: Secondary | ICD-10-CM | POA: Diagnosis not present

## 2019-07-16 DIAGNOSIS — Z0000 Encounter for general adult medical examination without abnormal findings: Secondary | ICD-10-CM | POA: Diagnosis not present

## 2019-07-16 DIAGNOSIS — Z131 Encounter for screening for diabetes mellitus: Secondary | ICD-10-CM | POA: Diagnosis not present

## 2019-07-16 DIAGNOSIS — G4710 Hypersomnia, unspecified: Secondary | ICD-10-CM | POA: Diagnosis not present

## 2019-07-16 DIAGNOSIS — Z8639 Personal history of other endocrine, nutritional and metabolic disease: Secondary | ICD-10-CM | POA: Diagnosis not present

## 2019-07-16 DIAGNOSIS — Z23 Encounter for immunization: Secondary | ICD-10-CM | POA: Diagnosis not present

## 2019-07-16 DIAGNOSIS — F419 Anxiety disorder, unspecified: Secondary | ICD-10-CM | POA: Diagnosis not present

## 2019-07-16 DIAGNOSIS — R0683 Snoring: Secondary | ICD-10-CM | POA: Diagnosis not present

## 2019-07-16 DIAGNOSIS — E7800 Pure hypercholesterolemia, unspecified: Secondary | ICD-10-CM | POA: Diagnosis not present

## 2019-07-16 DIAGNOSIS — G35 Multiple sclerosis: Secondary | ICD-10-CM | POA: Diagnosis not present

## 2019-07-17 DIAGNOSIS — G4719 Other hypersomnia: Secondary | ICD-10-CM | POA: Insufficient documentation

## 2019-07-25 DIAGNOSIS — R0683 Snoring: Secondary | ICD-10-CM | POA: Diagnosis not present

## 2019-08-09 DIAGNOSIS — Z20828 Contact with and (suspected) exposure to other viral communicable diseases: Secondary | ICD-10-CM | POA: Diagnosis not present

## 2019-08-09 DIAGNOSIS — Z01812 Encounter for preprocedural laboratory examination: Secondary | ICD-10-CM | POA: Diagnosis not present

## 2019-08-09 DIAGNOSIS — G5601 Carpal tunnel syndrome, right upper limb: Secondary | ICD-10-CM | POA: Diagnosis not present

## 2019-08-14 DIAGNOSIS — Z886 Allergy status to analgesic agent status: Secondary | ICD-10-CM | POA: Diagnosis not present

## 2019-08-14 DIAGNOSIS — F419 Anxiety disorder, unspecified: Secondary | ICD-10-CM | POA: Diagnosis not present

## 2019-08-14 DIAGNOSIS — M797 Fibromyalgia: Secondary | ICD-10-CM | POA: Diagnosis not present

## 2019-08-14 DIAGNOSIS — Z79899 Other long term (current) drug therapy: Secondary | ICD-10-CM | POA: Diagnosis not present

## 2019-08-14 DIAGNOSIS — K219 Gastro-esophageal reflux disease without esophagitis: Secondary | ICD-10-CM | POA: Diagnosis not present

## 2019-08-14 DIAGNOSIS — G5601 Carpal tunnel syndrome, right upper limb: Secondary | ICD-10-CM | POA: Diagnosis not present

## 2019-08-14 DIAGNOSIS — G35 Multiple sclerosis: Secondary | ICD-10-CM | POA: Diagnosis not present

## 2019-08-14 DIAGNOSIS — E2740 Unspecified adrenocortical insufficiency: Secondary | ICD-10-CM | POA: Diagnosis not present

## 2019-08-15 DIAGNOSIS — G4733 Obstructive sleep apnea (adult) (pediatric): Secondary | ICD-10-CM | POA: Diagnosis not present

## 2019-09-03 DIAGNOSIS — G43719 Chronic migraine without aura, intractable, without status migrainosus: Secondary | ICD-10-CM | POA: Diagnosis not present

## 2019-09-19 DIAGNOSIS — F330 Major depressive disorder, recurrent, mild: Secondary | ICD-10-CM | POA: Diagnosis not present

## 2019-09-19 DIAGNOSIS — R0683 Snoring: Secondary | ICD-10-CM | POA: Diagnosis not present

## 2019-09-19 DIAGNOSIS — F4310 Post-traumatic stress disorder, unspecified: Secondary | ICD-10-CM | POA: Diagnosis not present

## 2019-09-19 DIAGNOSIS — F331 Major depressive disorder, recurrent, moderate: Secondary | ICD-10-CM | POA: Diagnosis not present

## 2019-09-30 DIAGNOSIS — R350 Frequency of micturition: Secondary | ICD-10-CM | POA: Diagnosis not present

## 2019-10-08 DIAGNOSIS — F330 Major depressive disorder, recurrent, mild: Secondary | ICD-10-CM | POA: Diagnosis not present

## 2019-10-08 DIAGNOSIS — R0683 Snoring: Secondary | ICD-10-CM | POA: Diagnosis not present

## 2019-10-08 DIAGNOSIS — F4310 Post-traumatic stress disorder, unspecified: Secondary | ICD-10-CM | POA: Diagnosis not present

## 2019-10-08 DIAGNOSIS — F5104 Psychophysiologic insomnia: Secondary | ICD-10-CM | POA: Diagnosis not present

## 2019-10-10 DIAGNOSIS — R300 Dysuria: Secondary | ICD-10-CM | POA: Diagnosis not present

## 2019-10-23 DIAGNOSIS — E7800 Pure hypercholesterolemia, unspecified: Secondary | ICD-10-CM | POA: Diagnosis not present

## 2019-10-23 DIAGNOSIS — R109 Unspecified abdominal pain: Secondary | ICD-10-CM | POA: Diagnosis not present

## 2019-10-25 DIAGNOSIS — R945 Abnormal results of liver function studies: Secondary | ICD-10-CM | POA: Diagnosis not present

## 2019-10-25 DIAGNOSIS — E7800 Pure hypercholesterolemia, unspecified: Secondary | ICD-10-CM | POA: Diagnosis not present

## 2019-10-25 DIAGNOSIS — G8929 Other chronic pain: Secondary | ICD-10-CM | POA: Diagnosis not present

## 2019-10-25 DIAGNOSIS — G35 Multiple sclerosis: Secondary | ICD-10-CM | POA: Diagnosis not present

## 2019-10-25 DIAGNOSIS — R102 Pelvic and perineal pain: Secondary | ICD-10-CM | POA: Diagnosis not present

## 2019-10-25 DIAGNOSIS — Z9884 Bariatric surgery status: Secondary | ICD-10-CM | POA: Diagnosis not present

## 2019-10-25 DIAGNOSIS — F339 Major depressive disorder, recurrent, unspecified: Secondary | ICD-10-CM | POA: Diagnosis not present

## 2019-10-25 DIAGNOSIS — R300 Dysuria: Secondary | ICD-10-CM | POA: Diagnosis not present

## 2019-10-28 DIAGNOSIS — F331 Major depressive disorder, recurrent, moderate: Secondary | ICD-10-CM | POA: Diagnosis not present

## 2019-10-28 DIAGNOSIS — F5104 Psychophysiologic insomnia: Secondary | ICD-10-CM | POA: Diagnosis not present

## 2019-10-28 DIAGNOSIS — F4310 Post-traumatic stress disorder, unspecified: Secondary | ICD-10-CM | POA: Diagnosis not present

## 2019-10-30 DIAGNOSIS — F4310 Post-traumatic stress disorder, unspecified: Secondary | ICD-10-CM | POA: Insufficient documentation

## 2019-11-05 DIAGNOSIS — F330 Major depressive disorder, recurrent, mild: Secondary | ICD-10-CM | POA: Diagnosis not present

## 2019-11-05 DIAGNOSIS — G4700 Insomnia, unspecified: Secondary | ICD-10-CM | POA: Diagnosis not present

## 2019-11-05 DIAGNOSIS — F419 Anxiety disorder, unspecified: Secondary | ICD-10-CM | POA: Diagnosis not present

## 2019-11-05 DIAGNOSIS — F4310 Post-traumatic stress disorder, unspecified: Secondary | ICD-10-CM | POA: Diagnosis not present

## 2019-11-12 DIAGNOSIS — R0789 Other chest pain: Secondary | ICD-10-CM | POA: Diagnosis not present

## 2019-11-12 DIAGNOSIS — M797 Fibromyalgia: Secondary | ICD-10-CM | POA: Diagnosis not present

## 2019-11-12 DIAGNOSIS — G894 Chronic pain syndrome: Secondary | ICD-10-CM | POA: Diagnosis not present

## 2019-11-12 DIAGNOSIS — M5441 Lumbago with sciatica, right side: Secondary | ICD-10-CM | POA: Diagnosis not present

## 2019-11-12 DIAGNOSIS — R945 Abnormal results of liver function studies: Secondary | ICD-10-CM | POA: Diagnosis not present

## 2019-11-12 DIAGNOSIS — M542 Cervicalgia: Secondary | ICD-10-CM | POA: Diagnosis not present

## 2019-11-12 DIAGNOSIS — M533 Sacrococcygeal disorders, not elsewhere classified: Secondary | ICD-10-CM | POA: Diagnosis not present

## 2019-11-26 DIAGNOSIS — M533 Sacrococcygeal disorders, not elsewhere classified: Secondary | ICD-10-CM | POA: Diagnosis not present

## 2019-11-26 DIAGNOSIS — R0789 Other chest pain: Secondary | ICD-10-CM | POA: Diagnosis not present

## 2019-11-26 DIAGNOSIS — M797 Fibromyalgia: Secondary | ICD-10-CM | POA: Diagnosis not present

## 2019-11-26 DIAGNOSIS — M542 Cervicalgia: Secondary | ICD-10-CM | POA: Diagnosis not present

## 2019-11-26 DIAGNOSIS — M5441 Lumbago with sciatica, right side: Secondary | ICD-10-CM | POA: Diagnosis not present

## 2019-11-26 DIAGNOSIS — G894 Chronic pain syndrome: Secondary | ICD-10-CM | POA: Diagnosis not present

## 2019-12-03 DIAGNOSIS — F4310 Post-traumatic stress disorder, unspecified: Secondary | ICD-10-CM | POA: Diagnosis not present

## 2019-12-03 DIAGNOSIS — F330 Major depressive disorder, recurrent, mild: Secondary | ICD-10-CM | POA: Diagnosis not present

## 2019-12-11 DIAGNOSIS — Z23 Encounter for immunization: Secondary | ICD-10-CM | POA: Diagnosis not present

## 2019-12-17 ENCOUNTER — Ambulatory Visit: Payer: Medicare HMO | Admitting: Urology

## 2019-12-17 ENCOUNTER — Encounter: Payer: Self-pay | Admitting: Urology

## 2019-12-17 ENCOUNTER — Other Ambulatory Visit: Payer: Self-pay

## 2019-12-17 VITALS — BP 111/78 | HR 97 | Temp 96.6°F | Ht 72.0 in | Wt 209.0 lb

## 2019-12-17 DIAGNOSIS — R350 Frequency of micturition: Secondary | ICD-10-CM | POA: Diagnosis not present

## 2019-12-17 DIAGNOSIS — R310 Gross hematuria: Secondary | ICD-10-CM

## 2019-12-17 LAB — POCT URINALYSIS DIPSTICK
Bilirubin, UA: NEGATIVE
Glucose, UA: NEGATIVE
Ketones, UA: NEGATIVE
Nitrite, UA: POSITIVE
Protein, UA: NEGATIVE
Spec Grav, UA: 1.015 (ref 1.010–1.025)
Urobilinogen, UA: 0.2 E.U./dL
pH, UA: 6 (ref 5.0–8.0)

## 2019-12-17 MED ORDER — NITROFURANTOIN MONOHYD MACRO 100 MG PO CAPS: 100.0000 mg | ORAL_CAPSULE | Freq: Two times a day (BID) | ORAL | 0 refills | Status: AC

## 2019-12-17 NOTE — Progress Notes (Signed)
Urological Symptom Review  Patient is experiencing the following symptoms: Frequent urination Hard to postpone urination Burning/pain with urination Get up at night to urinate Leakage of urine Stream starts and stops Trouble starting stream Have to strain to urinate Blood in urine Urinary tract infection Weak stream  Kidney stones   Review of Systems  Gastrointestinal (upper)  : Indigestion/heartburn  Gastrointestinal (lower) : Diarrhea  Constitutional : Fatigue  Skin: Negative for skin symptoms  Eyes: Negative for eye symptoms  Ear/Nose/Throat : Negative for Ear/Nose/Throat symptoms  Hematologic/Lymphatic: Negative for Hematologic/Lymphatic symptoms  Cardiovascular : Leg swelling Chest pain  Respiratory : Negative for respiratory symptoms  Endocrine: Excessive thirst  Musculoskeletal: Back pain Joint pain  Neurological: Headaches Dizziness  Psychologic: Depression Anxiety

## 2019-12-17 NOTE — Progress Notes (Addendum)
H&P  Chief Complaint: Dysuria  History of Present Illness:  3.16.2021: Denise Avila is a 44 y.o. year old female presenting today having had dysuria and increased urinary freq/urgency since late December 2020. She reports having had severely painful urination, left flank pain, foul-smelling urine, and increased urinary freq/urgency. She does have a hx of KS's with pyelonephritis -- was symptomatic for several months. She did not tolerate ESWL well -- infection spread after stone was broken up. Since onset of her sx's she has been treated with two different antibiotics -- these helped her sx's while on them but her sx's returned soon after finishing them. She has an extensive hx of bowel disease and surgery -- most of her GI tract has been removed -- and she is aware that this hx puts her at a higher risk for stones. She has had no recent imaging.   (below copied from [facility] records):  F/u ureteral stone and nephrolithiasis. She underwent staged left ureteroscopy May 2016. I left a stent with string. There were no other stones. She had pain in her chest a couple weeks after the stent was removed and had a CT scan. I reviewed those images and they were normal. She still has some microscopic hematuria. She's had no flank pain or stone passage. She's had no dysuria or gross hematuria. She saw Dr. Denman George and will need a hysterectomy because basal cell carcinomas found in the uterus.   Past Medical History:  Diagnosis Date  . Abnormal Pap smear of cervix 12/2014  . Acid reflux   . Adrenal insufficiency ([place])   . Anxiety   . Arthritis   . Arthritis   . Depression   . Depression   . Endometriosis   . Fibroids    age 34  . High cholesterol   . History of kidney stones   . Liver disease    per pt report, will have biopsy Nov 08, 2017  . Migraines   . Pleurisy 02/2015   while hospitalized   . Seizure ([place])   . Skin cancer (melanoma) ([place])   . Syncope and collapse   . Uterine cancer [facility])      Past Surgical History:  Procedure Laterality Date  . ABDOMINAL HYSTERECTOMY    . ABLATION     uterine  . COLECTOMY    . CYSTOSCOPY W/ URETERAL STENT PLACEMENT Left 02/02/2015   Procedure: CYSTOSCOPY WITH RETROGRADE PYELOGRAM/URETERAL STENT PLACEMENT;  Surgeon: Festus Aloe, MD;  [facility];  Service: Urology;  Laterality: Left;  . CYSTOSCOPY WITH URETEROSCOPY AND STENT PLACEMENT Left 02/13/2015   Procedure: LEFT URETEROSCOPY WITH HOLMIUM LASER AND STENT PLACEMENT;  Surgeon: Festus Aloe, MD;  [facility];  Service: Urology;  Laterality: Left;  . DILATION AND CURETTAGE OF UTERUS    . ESOPHAGOGASTRODUODENOSCOPY ENDOSCOPY  10/11/2017   @ [facility]; BIOPSY OF LOWER INTESTINE  . HOLMIUM LASER APPLICATION N/A [id number]   Procedure: HOLMIUM LASER APPLICATION;  Surgeon: Festus Aloe, MD;  [facility];  Service: Urology;  Laterality: N/A;  . KNEE SURGERY  2019  . LAPAROSCOPIC GASTRIC SLEEVE RESECTION    . LAPAROSCOPY     x 4  . MELANOMA EXCISION  2000   melanoma removed from face, basal cell removed from nose  . REPAIR VAGINAL CUFF N/A 08/21/2015   Procedure: REPAIR VAGINAL CUFF, EXAM UNDER ANESTHESIA;  Surgeon: Everitt Amber, MD;  [facility];  Service: Gynecology;  Laterality: N/A;  . ROBOTIC ASSISTED TOTAL HYSTERECTOMY WITH BILATERAL SALPINGO OOPHERECTOMY  Bilateral 05/21/2015   Procedure: XI ROBOTIC ASSISTED TOTAL HYSTERECTOMY WITH BILATERAL SALPINGO OOPHORECTOMY;  Surgeon: Everitt Amber, MD;  [facility];  Service: Gynecology;  Laterality: Bilateral;    Home Medications:  (Not in a hospital admission)   Allergies:  Allergies  Allergen Reactions  . Penicillins Swelling and Rash    Rash all over [place] and lip swelling. Has patient had a PCN reaction causing immediate rash, facial/tongue/throat swelling, SOB or lightheadedness with hypotension: Yes Has patient had a PCN reaction causing severe rash involving mucus membranes or skin necrosis: Yes Has patient  had a PCN reaction that required hospitalization: No If all of the above answers are "NO", then may proceed with Cephalosporin use.  Marland Kitchen Amoxicillin Rash    Rash all over Tor[place] Rash all over Tor[place] Rash all over [place] Rash all over [place]  . Ciprofloxacin Diarrhea and Nausea Only    Family History  Problem Relation Age of Onset  . Hypertension Mother   . Melanoma Mother   . Breast cancer Mother 78  . Cancer - Other Father        liver  . Diabetes Mellitus II Maternal Grandmother   . Heart disease Maternal Grandmother   . Breast cancer Maternal Grandmother   . Cancer - Lung Maternal Grandfather   . Osteoporosis Paternal Grandmother   . Melanoma Paternal Grandfather     Social History:  reports that she has never smoked. She has never used smokeless tobacco. She reports that she does not drink alcohol or use drugs.  ROS: A complete review of systems was performed.  All systems are negative except for pertinent findings as noted.  Physical Exam:  Vital signs in last 24 hours: Temp:  [96.6 F (35.9 C)] 96.6 F (35.9 C) (03/16 0926) Pulse Rate:  [97] 97 (03/16 0926) BP: (111)/(78) 111/78 (03/16 0926) Weight:  [209 lb (94.8 kg)] 209 lb (94.8 kg) (03/16 0926) General:  Alert and oriented, No acute distress HEENT: Normocephalic, atraumatic Neck: No JVD or lymphadenopathy Cardiovascular: Regular rate and rhythm Lungs: Clear bilaterally Abdomen: Soft, nontender, nondistended, no abdominal masses, Suprapubic and bilateral LQ tenderness (R>L) Back: No CVA tenderness Extremities: No edema Neurologic: Grossly intact  Laboratory Data:  Results for orders placed or performed in visit on 12/17/19 (from the past 24 hour(s))  POCT urinalysis dipstick     Status: Abnormal   Collection Time: 12/17/19  9:36 AM  Result Value Ref Range   Color, UA yellow    Clarity, UA     Glucose, UA Negative Negative   Bilirubin, UA neg    Ketones, UA neg    Spec Grav, UA 1.015 1.010 - 1.025   Blood,  UA hemo trace    pH, UA 6.0 5.0 - 8.0   Protein, UA Negative Negative   Urobilinogen, UA 0.2 0.2 or 1.0 E.U./dL   Nitrite, UA positive    Leukocytes, UA Moderate (2+) (A) Negative   Appearance clear    Odor     I have reviewed prior pt notes  I have reviewed notes from referring/previous physicians  I have reviewed urinalysis results    Impression/Assessment:  She does seem to still have a UTI, possibly secondary to an infected stone. Will culture and treat as though she is currently infected as well as send her for a stone protocol CT. Pending results, will plan appropriate follow-up.   Plan:  1. Stone protocol CT --- will forward results  2. Pending these results, will plan appropriate  follow-up.  I discussed possible stone treatment strategies w/ pt (I am strongly suspicious for stone)  3. Start on macrodantin -- urine sent for culture, will change abx if needed.   Dena Billet 12/17/2019, 9:50 AM  Lillette Boxer. Dahlstedt MD

## 2019-12-18 DIAGNOSIS — K6289 Other specified diseases of anus and rectum: Secondary | ICD-10-CM | POA: Diagnosis not present

## 2019-12-18 DIAGNOSIS — Z01812 Encounter for preprocedural laboratory examination: Secondary | ICD-10-CM | POA: Diagnosis not present

## 2019-12-18 DIAGNOSIS — Z9049 Acquired absence of other specified parts of digestive tract: Secondary | ICD-10-CM | POA: Diagnosis not present

## 2019-12-20 ENCOUNTER — Other Ambulatory Visit: Payer: Self-pay

## 2019-12-20 DIAGNOSIS — R109 Unspecified abdominal pain: Secondary | ICD-10-CM

## 2019-12-20 NOTE — Progress Notes (Signed)
Central scheduling called and state this pts order for abd w/o needs to be changed to CT renal stone study per radiology

## 2019-12-26 ENCOUNTER — Telehealth: Payer: Self-pay | Admitting: Urology

## 2019-12-26 NOTE — Telephone Encounter (Signed)
Scheduling called. They stated that the CT department needed a new order. Instead of CT abdomen w/o contrast. It needs to ne Renal Stone Study.

## 2019-12-27 NOTE — Telephone Encounter (Signed)
Order was updated on 3/19

## 2019-12-30 ENCOUNTER — Telehealth: Payer: Self-pay | Admitting: Urology

## 2019-12-30 NOTE — Telephone Encounter (Signed)
Ct scheduled for 3/31. Reports burning and frequency with flank pain. Pt feels she has uti. Tried azo with no relief. Finished abx last week that she was placed on prior to seeing [place]. Okay to do urine drop off?

## 2019-12-30 NOTE — Telephone Encounter (Signed)
Patient requests a nurse return her call. 

## 2019-12-31 DIAGNOSIS — F4310 Post-traumatic stress disorder, unspecified: Secondary | ICD-10-CM | POA: Diagnosis not present

## 2019-12-31 DIAGNOSIS — F331 Major depressive disorder, recurrent, moderate: Secondary | ICD-10-CM | POA: Diagnosis not present

## 2019-12-31 DIAGNOSIS — F411 Generalized anxiety disorder: Secondary | ICD-10-CM | POA: Diagnosis not present

## 2019-12-31 NOTE — Telephone Encounter (Signed)
Scheduled for drop off urine tomorrow. Pt notified

## 2019-12-31 NOTE — Telephone Encounter (Signed)
Ok for urine dropoff

## 2020-01-01 ENCOUNTER — Ambulatory Visit: Payer: Medicare HMO

## 2020-01-01 ENCOUNTER — Other Ambulatory Visit: Payer: Self-pay

## 2020-01-01 ENCOUNTER — Other Ambulatory Visit (HOSPITAL_COMMUNITY)
Admission: RE | Admit: 2020-01-01 | Discharge: 2020-01-01 | Disposition: A | Discharge status: Home / Self Care | Payer: Medicare HMO | Source: Ambulatory Visit | Attending: Urology | Admitting: Urology

## 2020-01-01 ENCOUNTER — Ambulatory Visit (HOSPITAL_COMMUNITY)
Admission: RE | Admit: 2020-01-01 | Discharge: 2020-01-01 | Disposition: A | Discharge status: Home / Self Care | Payer: Medicare HMO | Source: Ambulatory Visit | Attending: Urology | Admitting: Urology

## 2020-01-01 DIAGNOSIS — R109 Unspecified abdominal pain: Secondary | ICD-10-CM | POA: Insufficient documentation

## 2020-01-01 DIAGNOSIS — R350 Frequency of micturition: Secondary | ICD-10-CM

## 2020-01-04 LAB — URINE CULTURE: Culture: 60000 — AB

## 2020-01-06 DIAGNOSIS — Z23 Encounter for immunization: Secondary | ICD-10-CM | POA: Diagnosis not present

## 2020-01-06 NOTE — Progress Notes (Signed)
Pt dropped off urine sample at office a 01/01/20

## 2020-01-07 DIAGNOSIS — R0789 Other chest pain: Secondary | ICD-10-CM | POA: Diagnosis not present

## 2020-01-07 DIAGNOSIS — G894 Chronic pain syndrome: Secondary | ICD-10-CM | POA: Diagnosis not present

## 2020-01-07 DIAGNOSIS — M5441 Lumbago with sciatica, right side: Secondary | ICD-10-CM | POA: Diagnosis not present

## 2020-01-07 DIAGNOSIS — M5416 Radiculopathy, lumbar region: Secondary | ICD-10-CM | POA: Diagnosis not present

## 2020-01-07 DIAGNOSIS — M797 Fibromyalgia: Secondary | ICD-10-CM | POA: Diagnosis not present

## 2020-01-07 DIAGNOSIS — M542 Cervicalgia: Secondary | ICD-10-CM | POA: Diagnosis not present

## 2020-01-07 DIAGNOSIS — M533 Sacrococcygeal disorders, not elsewhere classified: Secondary | ICD-10-CM | POA: Diagnosis not present

## 2020-01-08 ENCOUNTER — Telehealth: Payer: Self-pay | Admitting: Urology

## 2020-01-08 NOTE — Telephone Encounter (Signed)
Message sent to Dr. Diona Fanti from the CT result message he sent staff

## 2020-01-08 NOTE — Telephone Encounter (Signed)
Called and stated she was in a lot of pain and wishes for someone to call her back with her CT and urine results.

## 2020-01-09 ENCOUNTER — Telehealth: Payer: Self-pay | Admitting: Urology

## 2020-01-09 NOTE — Telephone Encounter (Signed)
Pt reports urinary pain is back after completing abx course. Reports fever today of 100.1 and back pain. I was looking to see about working her in tomorrow to see Dr. Jeffie Pollock but his schedule is tight. Any suggestions? Last culture grew ecoli.

## 2020-01-09 NOTE — Telephone Encounter (Signed)
Created in error

## 2020-01-09 NOTE — Telephone Encounter (Signed)
Pt left a message this morning and asked a nurse call her regarding pain med and a antibiotic being sent in.

## 2020-01-13 NOTE — Telephone Encounter (Signed)
We need a urine for urinalysis and culture prior to starting antibiotic.

## 2020-01-14 ENCOUNTER — Other Ambulatory Visit (HOSPITAL_COMMUNITY)
Admission: RE | Admit: 2020-01-14 | Discharge: 2020-01-14 | Disposition: A | Discharge status: Home / Self Care | Payer: Medicare HMO | Source: Other Acute Inpatient Hospital | Attending: Urology | Admitting: Urology

## 2020-01-14 ENCOUNTER — Encounter: Payer: Self-pay | Admitting: Urology

## 2020-01-14 ENCOUNTER — Other Ambulatory Visit: Payer: Self-pay

## 2020-01-14 ENCOUNTER — Ambulatory Visit: Payer: Medicare HMO | Admitting: Urology

## 2020-01-14 VITALS — BP 95/65 | HR 78 | Temp 96.8°F | Ht 72.0 in | Wt 209.0 lb

## 2020-01-14 DIAGNOSIS — R300 Dysuria: Secondary | ICD-10-CM | POA: Insufficient documentation

## 2020-01-14 LAB — POCT URINALYSIS DIPSTICK
Bilirubin, UA: NEGATIVE
Glucose, UA: NEGATIVE
Ketones, UA: NEGATIVE
Nitrite, UA: POSITIVE
Protein, UA: POSITIVE — AB
Spec Grav, UA: >=1.03 — AB (ref 1.010–1.025)
Urobilinogen, UA: 0.2 E.U./dL
pH, UA: 5 (ref 5.0–8.0)

## 2020-01-14 LAB — CBC
HCT: 40.3 % (ref 35.0–45.0)
Hemoglobin: 13.5 g/dL (ref 11.7–15.5)
MCH: 29.5 pg (ref 27.0–33.0)
MCHC: 33.5 g/dL (ref 32.0–36.0)
MCV: 88 fL (ref 80.0–100.0)
MPV: 11 fL (ref 7.5–12.5)
Platelets: 186 10*3/uL (ref 140–400)
RBC: 4.58 10*6/uL (ref 3.80–5.10)
RDW: 12.7 % (ref 11.0–15.0)
WBC: 5.1 10*3/uL (ref 3.8–10.8)

## 2020-01-14 MED ORDER — METHENAMINE MANDELATE 1 G PO TABS: 1000.0000 mg | ORAL_TABLET | Freq: Two times a day (BID) | ORAL | 11 refills | Status: DC

## 2020-01-14 MED ORDER — SULFAMETHOXAZOLE-TRIMETHOPRIM 800-160 MG PO TABS: 1.0000 | ORAL_TABLET | Freq: Two times a day (BID) | ORAL | 0 refills | Status: DC

## 2020-01-14 NOTE — Progress Notes (Signed)
H&P  Chief Complaint: Kidney Stones  History of Present Illness:  4.13.2021: Denise Avila is a 44 y.o. year old female. She returns today for follow-up having had severe flank pain, nausea, fevers, dysuria, and "swelling." These sx's have caused her quite a bit of concern over possible KS. Recent CT indicated solitary stone in lower pole of Rt kidney with no stones present in ureter or renal pelvis. She reports that her dysuria has been constant since finishing course of abx given at last visit and has had constant fevers over last week with her most recent having been 102 on 4.11.2021.  She reports that she has been on multiple courses of abx since this last November (primaryly prescribed per her PCP).   Past Medical History:  Diagnosis Date  . Abnormal Pap smear of cervix 12/2014  . Acid reflux   . Adrenal insufficiency ([place])   . Anxiety   . Arthritis   . Arthritis   . Depression   . Depression   . Endometriosis   . Fibroids    age 51  . High cholesterol   . History of kidney stones   . Liver disease    per pt report, will have biopsy Nov 08, 2017  . Migraines   . Pleurisy 02/2015   while hospitalized   . Seizure ([place])   . Skin cancer (melanoma) ([place])   . Syncope and collapse   . Uterine cancer [facility])     Past Surgical History:  Procedure Laterality Date  . ABDOMINAL HYSTERECTOMY    . ABLATION     uterine  . COLECTOMY    . CYSTOSCOPY W/ URETERAL STENT PLACEMENT Left 02/02/2015   Procedure: CYSTOSCOPY WITH RETROGRADE PYELOGRAM/URETERAL STENT PLACEMENT;  Surgeon: Festus Aloe, MD;  Location: [facility];  Service: Urology;  Laterality: Left;  . CYSTOSCOPY WITH URETEROSCOPY AND STENT PLACEMENT Left 02/13/2015   Procedure: LEFT URETEROSCOPY WITH HOLMIUM LASER AND STENT PLACEMENT;  Surgeon: Festus Aloe, MD;  Location: [facility];  Service: Urology;  Laterality: Left;  . DILATION AND CURETTAGE OF UTERUS    . ESOPHAGOGASTRODUODENOSCOPY ENDOSCOPY  10/11/2017   @ [facility]; BIOPSY  OF LOWER INTESTINE  . HOLMIUM LASER APPLICATION N/A [id number]   Procedure: HOLMIUM LASER APPLICATION;  Surgeon: Festus Aloe, MD;  Location: [facility];  Service: Urology;  Laterality: N/A;  . KNEE SURGERY  2019  . LAPAROSCOPIC GASTRIC SLEEVE RESECTION    . LAPAROSCOPY     x 4  . MELANOMA EXCISION  2000   melanoma removed from face, basal cell removed from nose  . REPAIR VAGINAL CUFF N/A 08/21/2015   Procedure: REPAIR VAGINAL CUFF, EXAM UNDER ANESTHESIA;  Surgeon: Everitt Amber, MD;  Location: [facility];  Service: Gynecology;  Laterality: N/A;  . ROBOTIC ASSISTED TOTAL HYSTERECTOMY WITH BILATERAL SALPINGO OOPHERECTOMY Bilateral 05/21/2015   Procedure: XI ROBOTIC ASSISTED TOTAL HYSTERECTOMY WITH BILATERAL SALPINGO OOPHORECTOMY;  Surgeon: Everitt Amber, MD;  Location: [facility];  Service: Gynecology;  Laterality: Bilateral;    Home Medications:  (Not in a hospital admission)   Allergies:  Allergies  Allergen Reactions  . Penicillins Swelling and Rash    Rash all over Torso and lip swelling. Has patient had a PCN reaction causing immediate rash, facial/tongue/throat swelling, SOB or lightheadedness with hypotension: Yes Has patient had a PCN reaction causing severe rash involving mucus membranes or skin necrosis: Yes Has patient had a PCN reaction that required hospitalization: No If all of the above answers are "NO", then may proceed with Cephalosporin  use.  . Amoxicillin Rash    Rash all over Torso Rash all over Torso Rash all over Torso Rash all over Torso  . Ciprofloxacin Diarrhea and Nausea Only    Family History  Problem Relation Age of Onset  . Hypertension Mother   . Melanoma Mother   . Breast cancer Mother 20  . Cancer - Other Father        liver  . Diabetes Mellitus II Maternal Grandmother   . Heart disease Maternal Grandmother   . Breast cancer Maternal Grandmother   . Cancer - Lung Maternal Grandfather   . Osteoporosis Paternal Grandmother   . Melanoma Paternal Grandfather      Social History:  reports that she has never smoked. She has never used smokeless tobacco. She reports that she does not drink alcohol or use drugs.  ROS: A complete review of systems was performed.  All systems are negative except for pertinent findings as noted.  Physical Exam:  Vital signs in last 24 hours: Temp:  [96.8 F (36 C)] 96.8 F (36 C) (04/13 0844) Pulse Rate:  [78] 78 (04/13 0844) BP: (95)/(65) 95/65 (04/13 0844) Weight:  [209 lb (94.8 kg)] 209 lb (94.8 kg) (04/13 0844) General:  Alert and oriented, No acute distress HEENT: Normocephalic, atraumatic Neck: No JVD or lymphadenopathy Cardiovascular: Regular rate  Lungs: Normal inspiratory/expiratory excursion Abdomen: Soft, nontender, nondistended, no abdominal masses, RLQ tenderness Back: Right CVA tenderness Extremities: No edema Neurologic: Grossly intact  Laboratory Data:  Results for orders placed or performed in visit on 01/14/20 (from the past 24 hour(s))  POCT urinalysis dipstick     Status: Abnormal   Collection Time: 01/14/20  8:56 AM  Result Value Ref Range   Color, UA dark yellow    Clarity, UA     Glucose, UA Negative Negative   Bilirubin, UA neg    Ketones, UA neg    Spec Grav, UA >=1.030 (A) 1.010 - 1.025   Blood, UA trace intact    pH, UA 5.0 5.0 - 8.0   Protein, UA Positive (A) Negative   Urobilinogen, UA 0.2 0.2 or 1.0 E.U./dL   Nitrite, UA positive    Leukocytes, UA Small (1+) (A) Negative   Appearance clear    Odor     I have reviewed prior pt notes  I have reviewed notes from referring/previous physicians  I have reviewed urinalysis results  I have independently reviewed prior imaging   Radiologic Imaging: 3 mm nonobstructing right lower pole renal calculus. No ureteral or bladder calculi. No hydronephrosis.  Status post subtotal colectomy with ileorectal anastomosis. Additional postsurgical changes as above.  11 mm short axis ileocolic node in the right mid abdomen. In  the absence of known malignancy, this is likely reactive given adjacent postsurgical changes. In the setting of malignancy, consider follow-up in 3-6 months.   Impression/Assessment:  UA indicates possible infection -- with her fevers I am concerned over possible kidney infection. Will culture and get WBC today. We will discuss possibly starting her on mandelamine at later visit.  Recent CT negative for passing stones that would be producing her sx's.  Plan:  1. Urine culture and CBC -- will forward results and treated pending results. Start on Bactrim  2. She was advised to not be treated with abx for just bacteriuria and to only be treated when symptomatic.  3. We will discuss starting her on mandelamine once her current infection is cleared.  4. Start on daily probiotic. Once  she finishes course of abx she will also start on methenamine.   5. Return in 3 mo for OV.  6. I will forward CT results (solitary enlarged LN in abd) to Dr Lester Kinsman @ [facility] 01/14/2020, 9:14 AM  Lillette Boxer. Alayjah Boehringer MD

## 2020-01-14 NOTE — Telephone Encounter (Signed)
Pt here for office visit today.

## 2020-01-14 NOTE — Progress Notes (Signed)
Urological Symptom Review  Patient is experiencing the following symptoms: Frequent urination Hard to postpone urination Burning/pain with urination Get up at night to urinate Leakage of urine Stream starts and stops Trouble starting stream Have to strain to urinate Blood in urine Urinary tract infection  Kidney Stones   Review of Systems  Gastrointestinal (upper)  : Nausea Vomiting  Gastrointestinal (lower) : Diarrhea  Constitutional : Fever  Skin: Negative for skin symptoms  Eyes: Negative for eye symptoms  Ear/Nose/Throat : Negative for Ear/Nose/Throat symptoms  Hematologic/Lymphatic: Negative for Hematologic/Lymphatic symptoms  Cardiovascular : Leg swelling  Respiratory : Negative for respiratory symptoms  Endocrine: Negative for endocrine symptoms  Musculoskeletal: Back pain Joint pain  Neurological: Headaches Dizziness  Psychologic: Depression Anxiety

## 2020-01-16 LAB — URINE CULTURE: Culture: >=100000 — AB

## 2020-01-17 DIAGNOSIS — M5116 Intervertebral disc disorders with radiculopathy, lumbar region: Secondary | ICD-10-CM | POA: Diagnosis not present

## 2020-01-17 DIAGNOSIS — M4727 Other spondylosis with radiculopathy, lumbosacral region: Secondary | ICD-10-CM | POA: Diagnosis not present

## 2020-01-17 DIAGNOSIS — M4726 Other spondylosis with radiculopathy, lumbar region: Secondary | ICD-10-CM | POA: Diagnosis not present

## 2020-01-20 ENCOUNTER — Telehealth: Payer: Self-pay

## 2020-01-20 NOTE — Telephone Encounter (Signed)
-----   Message from Franchot Gallo, MD sent at 01/20/2020  9:26 AM EDT ----- Call pt--wbc/cbc normal--wbc 5k which is not c/w severe infection. Culture + for e coli--bactrim should cover it ----- Message ----- From: Dorisann Frames, RN Sent: 01/15/2020   8:40 AM EDT To: Franchot Gallo, MD  Please review

## 2020-01-20 NOTE — Telephone Encounter (Signed)
I called and relayed the message to the patient. She still reports a fullness and burning feeling with back pain and unchanged in frequency feeling with current antibiotic therapy. Messaged relayed to MD

## 2020-01-22 ENCOUNTER — Telehealth: Payer: Self-pay

## 2020-01-24 NOTE — Telephone Encounter (Signed)
Pt wishes for another appointment. Scheduled next week.

## 2020-01-27 ENCOUNTER — Other Ambulatory Visit: Payer: Self-pay

## 2020-01-27 ENCOUNTER — Ambulatory Visit: Payer: Medicare HMO | Admitting: Urology

## 2020-01-27 ENCOUNTER — Encounter: Payer: Self-pay | Admitting: Urology

## 2020-01-27 VITALS — BP 87/57 | HR 80 | Temp 97.7°F | Ht 72.0 in | Wt 202.0 lb

## 2020-01-27 DIAGNOSIS — R300 Dysuria: Secondary | ICD-10-CM | POA: Diagnosis not present

## 2020-01-27 DIAGNOSIS — N3021 Other chronic cystitis with hematuria: Secondary | ICD-10-CM | POA: Diagnosis not present

## 2020-01-27 LAB — POCT URINALYSIS DIPSTICK
Bilirubin, UA: NEGATIVE
Blood, UA: NEGATIVE
Glucose, UA: NEGATIVE
Ketones, UA: NEGATIVE
Leukocytes, UA: NEGATIVE
Nitrite, UA: NEGATIVE
Protein, UA: POSITIVE — AB
Spec Grav, UA: >=1.03 — AB (ref 1.010–1.025)
Urobilinogen, UA: 0.2 E.U./dL
pH, UA: 5 (ref 5.0–8.0)

## 2020-01-27 MED ORDER — MIRABEGRON ER 25 MG PO TB24
25.0000 mg | ORAL_TABLET | Freq: Every day | ORAL | 0 refills | Status: DC
Start: 2020-01-27 — End: 2020-02-25

## 2020-01-27 MED ORDER — FESOTERODINE FUMARATE ER 4 MG PO TB24
4.0000 mg | ORAL_TABLET | Freq: Every day | ORAL | 0 refills | Status: DC
Start: 2020-01-27 — End: 2020-03-18

## 2020-01-27 NOTE — Progress Notes (Signed)
Urological Symptom Review  Patient is experiencing the following symptoms: Frequent urination Hard to postpone urination Burning/pain with urination Get up at night to urinate Leakage of urine Trouble starting stream Urinary tract infection  Kidney Stones   Review of Systems  Gastrointestinal (upper)  : Nausea Vomiting  Gastrointestinal (lower) : Negative for lower GI symptoms  Constitutional : Negative for symptoms  Skin: Negative for skin symptoms  Eyes: Negative for eye symptoms  Ear/Nose/Throat : Negative for Ear/Nose/Throat symptoms  Hematologic/Lymphatic: Negative for Hematologic/Lymphatic symptoms  Cardiovascular : Negative for cardiovascular symptoms  Respiratory : Negative for respiratory symptoms  Endocrine: Negative for endocrine symptoms  Musculoskeletal: Negative for musculoskeletal symptoms  Neurological: Negative for neurological symptoms  Psychologic: Negative for psychiatric symptoms

## 2020-01-27 NOTE — Patient Instructions (Signed)

## 2020-01-27 NOTE — Progress Notes (Signed)
01/27/2020 11:51 AM   Denise Avila 07-Jun-1976 [id number]  Referring provider: Lennie Odor, [place] [employer] [place],  [place] [id number]  Recurrent UTI and urge incontinence  HPI: Denise Avila is a 44yo here for evaluation of recurrent UTI. She has had 6 documented UTIs since Nov 2020. Ecoli was the last 2. She has a hx of hysterectomy for Uterine carcer in 2014. She has urge incontinence and uses 10-12 pads per day. She has nevere tried an OAB medication   PMH: Past Medical History:  Diagnosis Date  . Abnormal Pap smear of cervix 12/2014  . Acid reflux   . Adrenal insufficiency ([place])   . Anxiety   . Arthritis   . Arthritis   . Depression   . Depression   . Endometriosis   . Fibroids    age 71  . High cholesterol   . History of kidney stones   . Liver disease    per pt report, will have biopsy Nov 08, 2017  . Migraines   . Pleurisy 02/2015   while hospitalized   . Seizure ([place])   . Skin cancer (melanoma) ([place])   . Syncope and collapse   . Uterine cancer [facility])     Surgical History: Past Surgical History:  Procedure Laterality Date  . ABDOMINAL HYSTERECTOMY    . ABLATION     uterine  . COLECTOMY    . CYSTOSCOPY W/ URETERAL STENT PLACEMENT Left 02/02/2015   Procedure: CYSTOSCOPY WITH RETROGRADE PYELOGRAM/URETERAL STENT PLACEMENT;  Surgeon: Festus Aloe, MD;  [facility];  Service: Urology;  Laterality: Left;  . CYSTOSCOPY WITH URETEROSCOPY AND STENT PLACEMENT Left 02/13/2015   Procedure: LEFT URETEROSCOPY WITH HOLMIUM LASER AND STENT PLACEMENT;  Surgeon: Festus Aloe, MD;  [facility];  Service: Urology;  Laterality: Left;  . DILATION AND CURETTAGE OF UTERUS    . ESOPHAGOGASTRODUODENOSCOPY ENDOSCOPY  10/11/2017   @ [facility]; BIOPSY OF LOWER INTESTINE  . HOLMIUM LASER APPLICATION N/A [id number]   Procedure: HOLMIUM LASER APPLICATION;  Surgeon: Festus Aloe, MD;  [facility];  Service: Urology;  Laterality: N/A;  . KNEE SURGERY   2019  . LAPAROSCOPIC GASTRIC SLEEVE RESECTION    . LAPAROSCOPY     x 4  . MELANOMA EXCISION  2000   melanoma removed from face, basal cell removed from nose  . REPAIR VAGINAL CUFF N/A 08/21/2015   Procedure: REPAIR VAGINAL CUFF, EXAM UNDER ANESTHESIA;  Surgeon: Everitt Amber, MD;  [facility];  Service: Gynecology;  Laterality: N/A;  . ROBOTIC ASSISTED TOTAL HYSTERECTOMY WITH BILATERAL SALPINGO OOPHERECTOMY Bilateral 05/21/2015   Procedure: XI ROBOTIC ASSISTED TOTAL HYSTERECTOMY WITH BILATERAL SALPINGO OOPHORECTOMY;  Surgeon: Everitt Amber, MD;  [facility];  Service: Gynecology;  Laterality: Bilateral;    Home Medications:  Allergies as of 01/27/2020      Reactions   Penicillins Swelling, Rash   Rash all over [place] and lip swelling. Has patient had a PCN reaction causing immediate rash, facial/tongue/throat swelling, SOB or lightheadedness with hypotension: Yes Has patient had a PCN reaction causing severe rash involving mucus membranes or skin necrosis: Yes Has patient had a PCN reaction that required hospitalization: No If all of the above answers are "NO", then may proceed with Cephalosporin use.   Amoxicillin Rash   Rash all over [place] Rash all over [place] Rash all over [place] Rash all over [place]   Ciprofloxacin Diarrhea, Nausea Only      Medication List  Accurate as of January 27, 2020 11:51 AM. If you have any questions, ask your nurse or doctor.        baclofen 10 MG tablet Commonly known as: LIORESAL Take by mouth.   beta carotene w/minerals tablet Take 1 tablet by mouth daily.   Vitamins/Minerals Tabs Take by mouth.   cefadroxil 1 g tablet Commonly known as: DURICEF   celecoxib 200 MG capsule Commonly known as: CELEBREX   clonazePAM 1 MG tablet Commonly known as: KLONOPIN Take 1 mg by mouth 3 (three) times daily.   diphenoxylate-atropine 2.5-0.025 MG tablet Commonly known as: LOMOTIL   DULoxetine 60 MG capsule Commonly known as: CYMBALTA Take  60 mg by mouth daily.   DULoxetine 30 MG capsule Commonly known as: CYMBALTA   ezetimibe 10 MG tablet Commonly known as: ZETIA   gabapentin 800 MG tablet Commonly known as: NEURONTIN Take 800 tablets by mouth 2 (two) times daily.   LamoTRIgine 50 MG Tb24 24 hour tablet Take 200 mg by mouth every evening.   methenamine 1 g tablet Commonly known as: MANDELAMINE Take 1 tablet (1,000 mg total) by mouth 2 (two) times daily.   naproxen 500 MG tablet Commonly known as: NAPROSYN Take 500 mg by mouth 2 (two) times daily as needed.   nortriptyline 75 MG capsule Commonly known as: PAMELOR   omeprazole 20 MG capsule Commonly known as: PRILOSEC Take 20 mg by mouth daily.   promethazine 12.5 MG tablet Commonly known as: PHENERGAN Take 12.5 mg by mouth 2 (two) times daily.   QUEtiapine 25 MG tablet Commonly known as: SEROQUEL Take by mouth.   rizatriptan 10 MG tablet Commonly known as: MAXALT   sucralfate 1 g tablet Commonly known as: Carafate Take 1 tablet (1 g total) by mouth 4 (four) times daily. What changed:   how much to take  when to take this   sulfamethoxazole-trimethoprim 800-160 MG tablet Commonly known as: BACTRIM DS Take 1 tablet by mouth 2 (two) times daily.   tiZANidine 4 MG tablet Commonly known as: ZANAFLEX Take 12 mg by mouth at bedtime. What changed: Another medication with the same name was removed. Continue taking this medication, and follow the directions you see here. Changed by: Nicolette Bang, MD       Allergies:  Allergies  Allergen Reactions  . Penicillins Swelling and Rash    Rash all over [place] and lip swelling. Has patient had a PCN reaction causing immediate rash, facial/tongue/throat swelling, SOB or lightheadedness with hypotension: Yes Has patient had a PCN reaction causing severe rash involving mucus membranes or skin necrosis: Yes Has patient had a PCN reaction that required hospitalization: No If all of the above answers  are "NO", then may proceed with Cephalosporin use.  Denise Avila Amoxicillin Rash    Rash all over Torso Rash all over [place] Rash all over [place] Rash all over [place]  . Ciprofloxacin Diarrhea and Nausea Only    Family History: Family History  Problem Relation Age of Onset  . Hypertension Mother   . Melanoma Mother   . Breast cancer Mother 82  . Cancer - Other Father        liver  . Diabetes Mellitus II Maternal Grandmother   . Heart disease Maternal Grandmother   . Breast cancer Maternal Grandmother   . Cancer - Lung Maternal Grandfather   . Osteoporosis Paternal Grandmother   . Melanoma Paternal Grandfather     Social History:  reports that she has never smoked. She has  never used smokeless tobacco. She reports that she does not drink alcohol or use drugs.  ROS: All other review of systems were reviewed and are negative except what is noted above in HPI  Physical Exam: BP (!) 87/57   Pulse 80   Temp 97.7 F (36.5 C)   Ht 6' (1.829 m)   Wt 202 lb (91.6 kg)   LMP 05/04/2015 (Approximate)   BMI 27.40 kg/m   Constitutional:  Alert and oriented, No acute distress. HEENT:  AT, moist mucus membranes.  Trachea midline, no masses. Cardiovascular: No clubbing, cyanosis, or edema. Respiratory: Normal respiratory effort, no increased work of breathing. GI: Abdomen is soft, nontender, nondistended, no abdominal masses GU: No CVA tenderness. Circumcised phallus. No masses/lesions on penis, testis, scrotum. Prostate 40g smooth no nodules no induration.  Lymph: No cervical or inguinal lymphadenopathy. Skin: No rashes, bruises or suspicious lesions. Neurologic: Grossly intact, no focal deficits, moving all 4 extremities. Psychiatric: Normal mood and affect.  Laboratory Data: Lab Results  Component Value Date   WBC 5.1 01/14/2020   HGB 13.5 01/14/2020   HCT 40.3 01/14/2020   MCV 88.0 01/14/2020   PLT 186 01/14/2020    Lab Results  Component Value Date   CREATININE 0.74 12/29/2017      No results found for: PSA  No results found for: TESTOSTERONE  No results found for: HGBA1C  Urinalysis    Component Value Date/Time   COLORURINE YELLOW (A) 06/21/2017 1735   APPEARANCEUR CLOUDY (A) 06/21/2017 1735   LABSPEC 1.019 06/21/2017 1735   PHURINE 5.0 06/21/2017 1735   GLUCOSEU NEGATIVE 06/21/2017 1735   HGBUR NEGATIVE 06/21/2017 1735   BILIRUBINUR neg 01/27/2020 1124   KETONESUR NEGATIVE 06/21/2017 1735   PROTEINUR Positive (A) 01/27/2020 1124   PROTEINUR NEGATIVE 06/21/2017 1735   UROBILINOGEN 0.2 01/27/2020 1124   UROBILINOGEN 1.0 05/18/2015 1359   NITRITE neg 01/27/2020 1124   NITRITE NEGATIVE 06/21/2017 1735   LEUKOCYTESUR Negative 01/27/2020 1124    Lab Results  Component Value Date   BACTERIA NONE SEEN 06/21/2017    Pertinent Imaging:  Results for orders placed during the hospital encounter of 11/27/15  DG Abd 1 View   Narrative CLINICAL DATA:  Constipation, Sitz mark study ; markers ingested 5 days ago, left upper quadrant and left lower quadrant abdominal pain.  EXAM: ABDOMEN - 1 VIEW  COMPARISON:  KUB images of November 25, 2015  FINDINGS: There is a single Sitz marker that projects over the distal transverse colon. There are 3 Sitz markers that project in the pelvis likely in the sigmoid. The stool burden within the ascending and transverse colon is moderate. The descending and rectosigmoid colonic stool burden is relatively small. There is no small bowel obstruction. The bony structures are unremarkable.  IMPRESSION: There has been further progression of the Sitz markers. The colonic stool burden in the right and transverse colon is moderate.   Electronically Signed   By: David  Martinique M.D.   On: 11/27/2015 14:10    No results found for this or any previous visit. No results found for this or any previous visit. No results found for this or any previous visit. Results for orders placed during the hospital encounter of  01/01/18  US RENAL   Narrative CLINICAL DATA:  Pelvic pain.  EXAM: RENAL / URINARY TRACT ULTRASOUND COMPLETE  COMPARISON:  None.  FINDINGS: Right Kidney:  Length: 12.1 cm. Echogenicity within normal limits. No mass or hydronephrosis visualized.  Left Kidney:  Length:  12.5 cm. Echogenicity within normal limits. No mass or hydronephrosis visualized.  Bladder:  Appears normal for degree of bladder distention. BILATERAL ureteral jets.  IMPRESSION: Negative.   Electronically Signed   By: Staci Righter M.D.   On: 01/01/2018 13:49    No results found for this or any previous visit. No results found for this or any previous visit. Results for orders placed during the hospital encounter of 01/01/20  CT RENAL STONE STUDY   Narrative CLINICAL DATA:  Right flank pain radiating to right groin. Hematuria. Urinary frequency. History of kidney stones.  EXAM: CT ABDOMEN AND PELVIS WITHOUT CONTRAST  TECHNIQUE: Multidetector CT imaging of the abdomen and pelvis was performed following the standard protocol without IV contrast.  COMPARISON:  08/21/2015  FINDINGS: Lower chest: Lung bases are clear.  Hepatobiliary: Unenhanced liver is unremarkable.  Gallbladder is unremarkable. No intrahepatic or extrahepatic ductal dilatation.  Pancreas: Within normal limits.  Spleen: Within normal limits.  Adrenals/Urinary Tract: Adrenal glands are within normal limits.  Suspected 14 mm cyst in the posterior right upper kidney (series 2/image 28), poorly evaluated. 3 mm nonobstructing right lower pole renal calculus (series 2/image 40). Left kidney is within normal limits.  No ureteral or bladder calculi.  No hydronephrosis.  Bladder is within normal limits.  Stomach/Bowel: Postsurgical changes involving the stomach.  Status post subtotal colectomy with ileorectal anastomosis.  No evidence of bowel obstruction.  Appendix is surgically absent.  Vascular/Lymphatic: No  evidence of abdominal aortic aneurysm.  11 mm short axis node in the right mid abdomen, likely reflecting an ileocolic node (series 2/image 85), borderline enlarged.  Reproductive: Status post hysterectomy.  No adnexal masses.  Other: No abdominopelvic ascites.  Musculoskeletal: Visualized osseous structures are within normal limits.  IMPRESSION: 3 mm nonobstructing right lower pole renal calculus. No ureteral or bladder calculi. No hydronephrosis.  Status post subtotal colectomy with ileorectal anastomosis. Additional postsurgical changes as above.  11 mm short axis ileocolic node in the right mid abdomen. In the absence of known malignancy, this is likely reactive given adjacent postsurgical changes. In the setting of malignancy, consider follow-up in 3-6 months.   Electronically Signed   By: Julian Hy M.D.   On: 01/01/2020 21:17     Assessment & Plan:    1. Dysuria -urine for culture, will call with results - POCT urinalysis dipstick  2. Chronic cystitis with hematuria -We discussed the natural hx of the various causes of recurrent UTIs. We discussed pad usage and the risk of UTI. We will address her urinary incontinence prior to placing the patient on antibiotic prosphylaxis    No follow-ups on file.  Nicolette Bang, MD  [facility] [place]

## 2020-01-28 NOTE — Telephone Encounter (Signed)
Ok to drop off urine

## 2020-02-06 DIAGNOSIS — M533 Sacrococcygeal disorders, not elsewhere classified: Secondary | ICD-10-CM | POA: Diagnosis not present

## 2020-02-06 DIAGNOSIS — M797 Fibromyalgia: Secondary | ICD-10-CM | POA: Diagnosis not present

## 2020-02-06 DIAGNOSIS — M5416 Radiculopathy, lumbar region: Secondary | ICD-10-CM | POA: Diagnosis not present

## 2020-02-06 DIAGNOSIS — M5441 Lumbago with sciatica, right side: Secondary | ICD-10-CM | POA: Diagnosis not present

## 2020-02-06 DIAGNOSIS — R0789 Other chest pain: Secondary | ICD-10-CM | POA: Diagnosis not present

## 2020-02-06 DIAGNOSIS — M25551 Pain in right hip: Secondary | ICD-10-CM | POA: Diagnosis not present

## 2020-02-06 DIAGNOSIS — M542 Cervicalgia: Secondary | ICD-10-CM | POA: Diagnosis not present

## 2020-02-06 DIAGNOSIS — G894 Chronic pain syndrome: Secondary | ICD-10-CM | POA: Diagnosis not present

## 2020-02-11 ENCOUNTER — Ambulatory Visit: Payer: Medicare HMO | Admitting: Urology

## 2020-02-20 DIAGNOSIS — E7800 Pure hypercholesterolemia, unspecified: Secondary | ICD-10-CM | POA: Diagnosis not present

## 2020-02-23 ENCOUNTER — Other Ambulatory Visit: Payer: Self-pay | Admitting: Urology

## 2020-02-24 ENCOUNTER — Encounter (HOSPITAL_COMMUNITY): Payer: Self-pay | Admitting: *Deleted

## 2020-02-24 ENCOUNTER — Emergency Department (HOSPITAL_COMMUNITY)
Admission: EM | Admit: 2020-02-24 | Discharge: 2020-02-25 | Disposition: A | Discharge status: Home / Self Care | Payer: Medicare HMO | Attending: Emergency Medicine | Admitting: Emergency Medicine

## 2020-02-24 ENCOUNTER — Other Ambulatory Visit: Payer: Self-pay

## 2020-02-24 ENCOUNTER — Emergency Department (HOSPITAL_COMMUNITY): Payer: Medicare HMO

## 2020-02-24 DIAGNOSIS — R61 Generalized hyperhidrosis: Secondary | ICD-10-CM | POA: Diagnosis not present

## 2020-02-24 DIAGNOSIS — Z85828 Personal history of other malignant neoplasm of skin: Secondary | ICD-10-CM | POA: Diagnosis not present

## 2020-02-24 DIAGNOSIS — Z8542 Personal history of malignant neoplasm of other parts of uterus: Secondary | ICD-10-CM | POA: Diagnosis not present

## 2020-02-24 DIAGNOSIS — R112 Nausea with vomiting, unspecified: Secondary | ICD-10-CM | POA: Diagnosis not present

## 2020-02-24 DIAGNOSIS — Z79899 Other long term (current) drug therapy: Secondary | ICD-10-CM | POA: Diagnosis not present

## 2020-02-24 DIAGNOSIS — R0789 Other chest pain: Secondary | ICD-10-CM

## 2020-02-24 DIAGNOSIS — R079 Chest pain, unspecified: Secondary | ICD-10-CM | POA: Diagnosis not present

## 2020-02-24 LAB — CBC
HCT: 42.3 % (ref 36.0–46.0)
Hemoglobin: 13.6 g/dL (ref 12.0–15.0)
MCH: 29.8 pg (ref 26.0–34.0)
MCHC: 32.2 g/dL (ref 30.0–36.0)
MCV: 92.8 fL (ref 80.0–100.0)
Platelets: 155 10*3/uL (ref 150–400)
RBC: 4.56 MIL/uL (ref 3.87–5.11)
RDW: 13 % (ref 11.5–15.5)
WBC: 6.8 10*3/uL (ref 4.0–10.5)
nRBC: 0 % (ref 0.0–0.2)

## 2020-02-24 LAB — BASIC METABOLIC PANEL
Anion gap: 8 (ref 5–15)
BUN: 21 mg/dL — ABNORMAL HIGH (ref 6–20)
CO2: 29 mmol/L (ref 22–32)
Calcium: 9 mg/dL (ref 8.9–10.3)
Chloride: 103 mmol/L (ref 98–111)
Creatinine, Ser: 0.65 mg/dL (ref 0.44–1.00)
GFR calc Af Amer: >60 mL/min (ref >60)
GFR calc non Af Amer: >60 mL/min (ref >60)
Glucose, Bld: 89 mg/dL (ref 70–99)
Potassium: 4.3 mmol/L (ref 3.5–5.1)
Sodium: 140 mmol/L (ref 135–145)

## 2020-02-24 LAB — TROPONIN I (HIGH SENSITIVITY)
Troponin I (High Sensitivity): <2 ng/L (ref <18)
Troponin I (High Sensitivity): <2 ng/L (ref <18)

## 2020-02-24 MED ORDER — SODIUM CHLORIDE 0.9% FLUSH: 3.0000 mL | Freq: Once | INTRAVENOUS | Status: DC

## 2020-02-24 NOTE — ED Triage Notes (Signed)
Pt c/o mid chest pain that radiates around to her back. Pt describes the chest pain as "an elephant sitting on it" and the back pain as burning.

## 2020-02-25 DIAGNOSIS — F331 Major depressive disorder, recurrent, moderate: Secondary | ICD-10-CM | POA: Diagnosis not present

## 2020-02-25 DIAGNOSIS — F4310 Post-traumatic stress disorder, unspecified: Secondary | ICD-10-CM | POA: Diagnosis not present

## 2020-02-25 MED ORDER — LIDOCAINE VISCOUS HCL 2 % MT SOLN
15.0000 mL | Freq: Once | OROMUCOSAL | Status: AC
  Administered 2020-02-25: 15 mL via ORAL
  Filled 2020-02-25: qty 15

## 2020-02-25 MED ORDER — ALUM & MAG HYDROXIDE-SIMETH 200-200-20 MG/5ML PO SUSP
30.0000 mL | Freq: Once | ORAL | Status: AC
  Administered 2020-02-25: 30 mL via ORAL
  Filled 2020-02-25: qty 30

## 2020-02-25 MED ORDER — ONDANSETRON 8 MG PO TBDP
8.0000 mg | ORAL_TABLET | Freq: Once | ORAL | Status: AC
  Administered 2020-02-25: 8 mg via ORAL
  Filled 2020-02-25: qty 1

## 2020-02-25 NOTE — ED Provider Notes (Signed)
[facility] EMERGENCY DEPARTMENT Provider Note   CSN: [id number] Arrival date & time: 02/24/20  1739   History Chief Complaint  Patient presents with  . Chest Pain    Denise Avila is a 44 y.o. female.  The history is provided by the patient.  Chest Pain She has a very complicated medical history including hyperlipidemia, GERD, seizures, secondary adrenal insufficiency and comes in because of chest pain for the last 2 days.  She describes a feeling like there is an elephant sitting on her chest with a burning sensation in the interscapular area.  Pain has actually improved somewhat today and she rates it at 5/10.  It has been constant.  It is somewhat worse if she takes a deep breath or if she lays flat.  There has been associated nausea and vomiting and some diaphoresis but no dyspnea.  She recently had nortriptyline and Al Decant added to her medication regimen.  She talked with her physician prescribed these to suggest that she come to the ED to make sure it was not a cardiac problem.  She denies tobacco use and denies history of hypertension or diabetes.  There is a family history of premature coronary atherosclerosis.  She denies history of travel, recent surgery, DVT or PE, and she is not taking any exogenous estrogens.  Past Medical History:  Diagnosis Date  . Abnormal Pap smear of cervix 12/2014  . Acid reflux   . Adrenal insufficiency ([place])   . Anxiety   . Arthritis   . Arthritis   . Depression   . Depression   . Endometriosis   . Fibroids    age 38  . High cholesterol   . History of kidney stones   . Liver disease    per pt report, will have biopsy Nov 08, 2017  . Migraines   . Pleurisy 02/2015   while hospitalized   . Seizure ([place])   . Skin cancer (melanoma) ([place])   . Syncope and collapse   . Uterine cancer [facility])     Patient Active Problem List   Diagnosis Date Noted  . Chronic cystitis with hematuria 01/27/2020  . Dysuria 01/27/2020  . Intractable  chronic migraine without aura with status migrainosus 04/25/2017  . Vaginal cuff dehiscence 08/21/2015  . Vaginal bleeding   . Surgical menopause on hormone replacement therapy 06/22/2015  . Endometriosis of ovary 05/21/2015  . Abnormal uterine bleeding   . Endometriosis   . Pyelonephritis 02/02/2015  . Nephrolithiasis 02/02/2015  . Hydronephrosis 02/02/2015  . Left ureteral stone     Past Surgical History:  Procedure Laterality Date  . ABDOMINAL HYSTERECTOMY    . ABLATION     uterine  . COLECTOMY    . CYSTOSCOPY W/ URETERAL STENT PLACEMENT Left 02/02/2015   Procedure: CYSTOSCOPY WITH RETROGRADE PYELOGRAM/URETERAL STENT PLACEMENT;  Surgeon: Festus Aloe, MD;  [facility];  Service: Urology;  Laterality: Left;  . CYSTOSCOPY WITH URETEROSCOPY AND STENT PLACEMENT Left 02/13/2015   Procedure: LEFT URETEROSCOPY WITH HOLMIUM LASER AND STENT PLACEMENT;  Surgeon: Festus Aloe, MD;  [facility];  Service: Urology;  Laterality: Left;  . DILATION AND CURETTAGE OF UTERUS    . ESOPHAGOGASTRODUODENOSCOPY ENDOSCOPY  10/11/2017   @ [facility]; BIOPSY OF LOWER INTESTINE  . HOLMIUM LASER APPLICATION N/A [id number]   Procedure: HOLMIUM LASER APPLICATION;  Surgeon: Festus Aloe, MD;  [facility];  Service: Urology;  Laterality: N/A;  . KNEE SURGERY  2019  . LAPAROSCOPIC GASTRIC SLEEVE RESECTION    .  LAPAROSCOPY     x 4  . MELANOMA EXCISION  2000   melanoma removed from face, basal cell removed from nose  . REPAIR VAGINAL CUFF N/A 08/21/2015   Procedure: REPAIR VAGINAL CUFF, EXAM UNDER ANESTHESIA;  Surgeon: Everitt Amber, MD;  [facility];  Service: Gynecology;  Laterality: N/A;  . ROBOTIC ASSISTED TOTAL HYSTERECTOMY WITH BILATERAL SALPINGO OOPHERECTOMY Bilateral 05/21/2015   Procedure: XI ROBOTIC ASSISTED TOTAL HYSTERECTOMY WITH BILATERAL SALPINGO OOPHORECTOMY;  Surgeon: Everitt Amber, MD;  [facility];  Service: Gynecology;  Laterality: Bilateral;     OB History   No  obstetric history on file.     Family History  Problem Relation Age of Onset  . Hypertension Mother   . Melanoma Mother   . Breast cancer Mother 68  . Cancer - Other Father        liver  . Diabetes Mellitus II Maternal Grandmother   . Heart disease Maternal Grandmother   . Breast cancer Maternal Grandmother   . Cancer - Lung Maternal Grandfather   . Osteoporosis Paternal Grandmother   . Melanoma Paternal Grandfather     Social History   Tobacco Use  . Smoking status: Never Smoker  . Smokeless tobacco: Never Used  Substance Use Topics  . Alcohol use: No  . Drug use: No    Home Medications Prior to Admission medications   Medication Sig Start Date End Date Taking? Authorizing Provider  baclofen (LIORESAL) 10 MG tablet Take by mouth. 02/20/18   [provider]  beta carotene w/minerals (OCUVITE) tablet Take 1 tablet by mouth daily.    [provider]  cefadroxil (DURICEF) 1 g tablet  09/04/19   [provider]  celecoxib (CELEBREX) 200 MG capsule  01/10/20   [provider]  clonazePAM (KLONOPIN) 1 MG tablet Take 1 mg by mouth 3 (three) times daily.  04/03/17   [provider]  diphenoxylate-atropine (LOMOTIL) 2.5-0.025 MG tablet  12/20/19   [provider]  DULoxetine (CYMBALTA) 30 MG capsule  01/16/20   [provider]  DULoxetine (CYMBALTA) 60 MG capsule Take 60 mg by mouth daily.  12/09/17   [provider]  ezetimibe (ZETIA) 10 MG tablet  11/06/19   [provider]  fesoterodine (TOVIAZ) 4 MG TB24 tablet Take 1 tablet (4 mg total) by mouth daily. 01/27/20   McKenzie, Candee Furbish, MD  gabapentin (NEURONTIN) 800 MG tablet Take 800 tablets by mouth 2 (two) times daily.  12/24/17   [provider]  LamoTRIgine 50 MG TB24 Take 200 mg by mouth every evening.  03/02/17   [provider]  methenamine (MANDELAMINE) 1 g tablet Take 1 tablet (1,000 mg total) by mouth 2 (two) times daily. 01/14/20    Franchot Gallo, MD  mirabegron ER (MYRBETRIQ) 25 MG TB24 tablet Take 1 tablet (25 mg total) by mouth daily. 01/27/20   McKenzie, Candee Furbish, MD  naproxen (NAPROSYN) 500 MG tablet Take 500 mg by mouth 2 (two) times daily as needed.    [provider]  nortriptyline (PAMELOR) 75 MG capsule  01/02/20   [provider]  omeprazole (PRILOSEC) 20 MG capsule Take 20 mg by mouth daily.    [provider]  promethazine (PHENERGAN) 12.5 MG tablet Take 12.5 mg by mouth 2 (two) times daily.    [provider]  QUEtiapine (SEROQUEL) 25 MG tablet Take by mouth. 11/05/19   [provider]  rizatriptan (MAXALT) 10 MG tablet  09/03/19   [provider]  sucralfate (CARAFATE) 1 G tablet Take 1 tablet (1 g total) by mouth 4 (four) times daily. Patient taking differently: Take 2 g by mouth 2 (two) times daily.  02/24/15   Lacretia Leigh, MD  sulfamethoxazole-trimethoprim (BACTRIM DS) 800-160 MG tablet Take 1 tablet by mouth 2 (two) times daily. 01/14/20   Franchot Gallo, MD  tiZANidine (ZANAFLEX) 4 MG tablet Take 12 mg by mouth at bedtime.  03/01/17   [provider]  Vitamins/Minerals TABS Take by mouth.    [provider]    Allergies    Penicillins, Amoxicillin, and Ciprofloxacin  Review of Systems   Review of Systems  Cardiovascular: Positive for chest pain.  All other systems reviewed and are negative.   Physical Exam Updated Vital Signs BP 110/81 (BP Location: Right Arm)   Pulse 69   Temp 97.9 F (36.6 C) (Oral)   Resp 15   Ht 6' (1.829 m)   Wt 91.6 kg   LMP 05/04/2015 (Approximate)   SpO2 100%   BMI 27.40 kg/m   Physical Exam Vitals and nursing note reviewed.   44 year old female, resting comfortably and in no acute distress. Vital signs are normal. Oxygen saturation is 100%, which is normal. Head is normocephalic and atraumatic. PERRLA, EOMI. Oropharynx is clear. Neck is nontender and supple without adenopathy or  JVD. Back is nontender and there is no CVA tenderness. Lungs are clear without rales, wheezes, or rhonchi. Chest is nontender. Heart has regular rate and rhythm without murmur. Abdomen is soft, flat, with mild epigastric tenderness.  There is no rebound or guarding.  There are no masses or hepatosplenomegaly and peristalsis is normoactive. Extremities have no cyanosis or edema, full range of motion is present. Skin is warm and dry without rash. Neurologic: Mental status is normal, cranial nerves are intact, there are no motor or sensory deficits.  ED Results / Procedures / Treatments   Labs (all labs ordered are listed, but only abnormal results are displayed) Labs Reviewed  BASIC METABOLIC PANEL - Abnormal; Notable for the following components:      Result Value   BUN 21 (*)    All other components within normal limits  CBC  TROPONIN I (HIGH SENSITIVITY)  TROPONIN I (HIGH SENSITIVITY)    EKG EKG Interpretation  Date/Time:  Monday Feb 24 2020 18:37:43 EDT Ventricular Rate:  93 PR Interval:  148 QRS Duration: 92 QT Interval:  400 QTC Calculation: 497 R Axis:   7 Text Interpretation: Normal sinus rhythm Nonspecific T wave abnormality Abnormal ECG LOW VOLTAGE When compared with ECG of 01/18/2018, No significant change was found Confirmed by Delora Fuel ([id number]) on 02/24/2020 10:56:27 PM   Radiology DG Chest 2 View  Result Date: 02/24/2020 CLINICAL DATA:  Chest pain. EXAM: CHEST - 2 VIEW COMPARISON:  November 25, 2017. FINDINGS: The heart size and mediastinal contours are within normal limits. Both lungs are clear. No pneumothorax or pleural effusion is noted. The visualized skeletal structures are unremarkable. IMPRESSION: No active cardiopulmonary disease. Electronically Signed   By: Marijo Conception M.D.   On: 02/24/2020 18:56    Procedures Procedures  Medications Ordered in ED Medications  sodium chloride flush (NS) 0.9 % injection 3 mL (has no administration in time  range)    ED Course  I have reviewed the triage vital signs and the nursing notes.  Pertinent labs & imaging results that were available during my care of the patient were reviewed by me and  considered in my medical decision making (see chart for details).  MDM Rules/Calculators/A&P Atypical chest pain of uncertain cause.  Patient risk score per heart pathway is 3 which puts her at low risk for major adverse cardiac events in the next 6 weeks.  ECG is unchanged from prior, chest x-ray is normal.  Troponin is normal x2.  She has no risk factors for pulmonary embolism and is negative by [facility] and [facility] criteria, so no need to screen with D-dimer.  Chest pain is a possible side effect of Probalan, but I suspect that this is actually an exacerbation of her underlying GERD.  We will give a therapeutic trial of a GI cocktail.  Old records are reviewed, and she does have a prior ED visit to evaluate chest pain at which time work-up was negative including negative CT angiogram of the chest.  There is no improvement with GI cocktail, making GERD unlikely to be causing her symptoms.  Patient states that she has already stopped taking pregabalin.  I have recommended that she follow-up with the prescriber to see if there is something that she can take that would be helpful without causing chest pain as a side effect.  Advised to return should symptoms worsen.  Final Clinical Impression(s) / ED Diagnoses Final diagnoses:  Atypical chest pain    Rx / DC Orders ED Discharge Orders    None       Delora Fuel, MD [id number] [phone]

## 2020-02-25 NOTE — Discharge Instructions (Addendum)
Talk with the provider who prescribed the Lyrica to see if there is another medication that can help.  Return if you are having any problems.

## 2020-02-26 ENCOUNTER — Ambulatory Visit: Payer: Medicare HMO | Admitting: Urology

## 2020-03-03 DIAGNOSIS — G43719 Chronic migraine without aura, intractable, without status migrainosus: Secondary | ICD-10-CM | POA: Diagnosis not present

## 2020-03-05 DIAGNOSIS — K633 Ulcer of intestine: Secondary | ICD-10-CM | POA: Diagnosis not present

## 2020-03-05 DIAGNOSIS — G4089 Other seizures: Secondary | ICD-10-CM | POA: Diagnosis not present

## 2020-03-05 DIAGNOSIS — Z980 Intestinal bypass and anastomosis status: Secondary | ICD-10-CM | POA: Diagnosis not present

## 2020-03-05 DIAGNOSIS — K5289 Other specified noninfective gastroenteritis and colitis: Secondary | ICD-10-CM | POA: Diagnosis not present

## 2020-03-05 DIAGNOSIS — Z9049 Acquired absence of other specified parts of digestive tract: Secondary | ICD-10-CM | POA: Diagnosis not present

## 2020-03-05 DIAGNOSIS — G35 Multiple sclerosis: Secondary | ICD-10-CM | POA: Diagnosis not present

## 2020-03-05 DIAGNOSIS — K6289 Other specified diseases of anus and rectum: Secondary | ICD-10-CM | POA: Diagnosis not present

## 2020-03-05 DIAGNOSIS — K5180 Other ulcerative colitis without complications: Secondary | ICD-10-CM | POA: Diagnosis not present

## 2020-03-05 DIAGNOSIS — K625 Hemorrhage of anus and rectum: Secondary | ICD-10-CM | POA: Diagnosis not present

## 2020-03-05 DIAGNOSIS — M1990 Unspecified osteoarthritis, unspecified site: Secondary | ICD-10-CM | POA: Diagnosis not present

## 2020-03-06 ENCOUNTER — Other Ambulatory Visit: Payer: Self-pay | Admitting: Urology

## 2020-03-16 ENCOUNTER — Ambulatory Visit: Payer: Medicare HMO | Admitting: Urology

## 2020-03-18 ENCOUNTER — Ambulatory Visit: Payer: Medicare HMO | Admitting: Urology

## 2020-03-24 DIAGNOSIS — F4310 Post-traumatic stress disorder, unspecified: Secondary | ICD-10-CM | POA: Diagnosis not present

## 2020-03-24 DIAGNOSIS — F331 Major depressive disorder, recurrent, moderate: Secondary | ICD-10-CM | POA: Diagnosis not present

## 2020-03-24 DIAGNOSIS — F411 Generalized anxiety disorder: Secondary | ICD-10-CM | POA: Diagnosis not present

## 2020-03-24 DIAGNOSIS — F5104 Psychophysiologic insomnia: Secondary | ICD-10-CM | POA: Diagnosis not present

## 2020-03-31 DIAGNOSIS — M5441 Lumbago with sciatica, right side: Secondary | ICD-10-CM | POA: Diagnosis not present

## 2020-03-31 DIAGNOSIS — M797 Fibromyalgia: Secondary | ICD-10-CM | POA: Diagnosis not present

## 2020-03-31 DIAGNOSIS — R0789 Other chest pain: Secondary | ICD-10-CM | POA: Diagnosis not present

## 2020-03-31 DIAGNOSIS — M25551 Pain in right hip: Secondary | ICD-10-CM | POA: Diagnosis not present

## 2020-03-31 DIAGNOSIS — G894 Chronic pain syndrome: Secondary | ICD-10-CM | POA: Diagnosis not present

## 2020-03-31 DIAGNOSIS — M533 Sacrococcygeal disorders, not elsewhere classified: Secondary | ICD-10-CM | POA: Diagnosis not present

## 2020-03-31 DIAGNOSIS — M542 Cervicalgia: Secondary | ICD-10-CM | POA: Diagnosis not present

## 2020-03-31 DIAGNOSIS — M5416 Radiculopathy, lumbar region: Secondary | ICD-10-CM | POA: Diagnosis not present

## 2020-04-14 ENCOUNTER — Ambulatory Visit: Payer: Medicare HMO | Admitting: Urology

## 2020-04-21 ENCOUNTER — Ambulatory Visit: Payer: Medicare HMO | Admitting: Urology

## 2020-04-21 DIAGNOSIS — F330 Major depressive disorder, recurrent, mild: Secondary | ICD-10-CM | POA: Diagnosis not present

## 2020-04-21 DIAGNOSIS — G4700 Insomnia, unspecified: Secondary | ICD-10-CM | POA: Diagnosis not present

## 2020-04-21 DIAGNOSIS — F411 Generalized anxiety disorder: Secondary | ICD-10-CM | POA: Diagnosis not present

## 2020-04-21 DIAGNOSIS — F4310 Post-traumatic stress disorder, unspecified: Secondary | ICD-10-CM | POA: Diagnosis not present

## 2020-04-23 ENCOUNTER — Ambulatory Visit: Payer: Medicare HMO | Admitting: Urology

## 2020-05-04 DIAGNOSIS — G43719 Chronic migraine without aura, intractable, without status migrainosus: Secondary | ICD-10-CM | POA: Diagnosis not present

## 2020-05-04 DIAGNOSIS — G43909 Migraine, unspecified, not intractable, without status migrainosus: Secondary | ICD-10-CM | POA: Diagnosis not present

## 2020-05-27 ENCOUNTER — Ambulatory Visit: Payer: Medicare HMO | Admitting: Urology

## 2020-06-05 DIAGNOSIS — F4310 Post-traumatic stress disorder, unspecified: Secondary | ICD-10-CM | POA: Diagnosis not present

## 2020-06-05 DIAGNOSIS — G4700 Insomnia, unspecified: Secondary | ICD-10-CM | POA: Diagnosis not present

## 2020-06-05 DIAGNOSIS — R0683 Snoring: Secondary | ICD-10-CM | POA: Diagnosis not present

## 2020-06-05 DIAGNOSIS — F411 Generalized anxiety disorder: Secondary | ICD-10-CM | POA: Diagnosis not present

## 2020-06-05 DIAGNOSIS — F331 Major depressive disorder, recurrent, moderate: Secondary | ICD-10-CM | POA: Diagnosis not present

## 2020-06-09 DIAGNOSIS — M5416 Radiculopathy, lumbar region: Secondary | ICD-10-CM | POA: Diagnosis not present

## 2020-06-09 DIAGNOSIS — M797 Fibromyalgia: Secondary | ICD-10-CM | POA: Diagnosis not present

## 2020-06-09 DIAGNOSIS — M5441 Lumbago with sciatica, right side: Secondary | ICD-10-CM | POA: Diagnosis not present

## 2020-06-09 DIAGNOSIS — M25551 Pain in right hip: Secondary | ICD-10-CM | POA: Diagnosis not present

## 2020-06-09 DIAGNOSIS — G894 Chronic pain syndrome: Secondary | ICD-10-CM | POA: Diagnosis not present

## 2020-06-09 DIAGNOSIS — M533 Sacrococcygeal disorders, not elsewhere classified: Secondary | ICD-10-CM | POA: Diagnosis not present

## 2020-06-09 DIAGNOSIS — R0789 Other chest pain: Secondary | ICD-10-CM | POA: Diagnosis not present

## 2020-06-09 DIAGNOSIS — M542 Cervicalgia: Secondary | ICD-10-CM | POA: Diagnosis not present

## 2020-06-22 DIAGNOSIS — M25561 Pain in right knee: Secondary | ICD-10-CM | POA: Diagnosis not present

## 2020-06-22 DIAGNOSIS — M25562 Pain in left knee: Secondary | ICD-10-CM | POA: Diagnosis not present

## 2020-06-25 ENCOUNTER — Other Ambulatory Visit: Payer: Self-pay | Admitting: Physician Assistant

## 2020-06-25 DIAGNOSIS — Z1231 Encounter for screening mammogram for malignant neoplasm of breast: Secondary | ICD-10-CM

## 2020-06-26 ENCOUNTER — Ambulatory Visit: Payer: Medicare HMO | Admitting: Urology

## 2020-06-29 DIAGNOSIS — K224 Dyskinesia of esophagus: Secondary | ICD-10-CM | POA: Diagnosis not present

## 2020-06-29 DIAGNOSIS — Z9884 Bariatric surgery status: Secondary | ICD-10-CM | POA: Diagnosis not present

## 2020-06-29 DIAGNOSIS — K219 Gastro-esophageal reflux disease without esophagitis: Secondary | ICD-10-CM | POA: Diagnosis not present

## 2020-06-29 DIAGNOSIS — E669 Obesity, unspecified: Secondary | ICD-10-CM | POA: Diagnosis not present

## 2020-07-16 DIAGNOSIS — K6289 Other specified diseases of anus and rectum: Secondary | ICD-10-CM | POA: Diagnosis not present

## 2020-07-16 DIAGNOSIS — G8929 Other chronic pain: Secondary | ICD-10-CM | POA: Diagnosis not present

## 2020-07-16 DIAGNOSIS — K5180 Other ulcerative colitis without complications: Secondary | ICD-10-CM | POA: Diagnosis not present

## 2020-07-16 DIAGNOSIS — Z903 Acquired absence of stomach [part of]: Secondary | ICD-10-CM | POA: Diagnosis not present

## 2020-07-16 DIAGNOSIS — R197 Diarrhea, unspecified: Secondary | ICD-10-CM | POA: Diagnosis not present

## 2020-07-16 DIAGNOSIS — R1310 Dysphagia, unspecified: Secondary | ICD-10-CM | POA: Diagnosis not present

## 2020-07-16 DIAGNOSIS — R1031 Right lower quadrant pain: Secondary | ICD-10-CM | POA: Diagnosis not present

## 2020-07-16 DIAGNOSIS — K219 Gastro-esophageal reflux disease without esophagitis: Secondary | ICD-10-CM | POA: Diagnosis not present

## 2020-07-16 DIAGNOSIS — K289 Gastrojejunal ulcer, unspecified as acute or chronic, without hemorrhage or perforation: Secondary | ICD-10-CM | POA: Diagnosis not present

## 2020-07-16 DIAGNOSIS — Z23 Encounter for immunization: Secondary | ICD-10-CM | POA: Diagnosis not present

## 2020-07-16 DIAGNOSIS — Z79899 Other long term (current) drug therapy: Secondary | ICD-10-CM | POA: Diagnosis not present

## 2020-07-16 DIAGNOSIS — R933 Abnormal findings on diagnostic imaging of other parts of digestive tract: Secondary | ICD-10-CM | POA: Diagnosis not present

## 2020-07-16 DIAGNOSIS — Z9884 Bariatric surgery status: Secondary | ICD-10-CM | POA: Diagnosis not present

## 2020-07-21 DIAGNOSIS — F339 Major depressive disorder, recurrent, unspecified: Secondary | ICD-10-CM | POA: Diagnosis not present

## 2020-07-21 DIAGNOSIS — R7309 Other abnormal glucose: Secondary | ICD-10-CM | POA: Diagnosis not present

## 2020-07-21 DIAGNOSIS — F419 Anxiety disorder, unspecified: Secondary | ICD-10-CM | POA: Diagnosis not present

## 2020-07-21 DIAGNOSIS — Z0000 Encounter for general adult medical examination without abnormal findings: Secondary | ICD-10-CM | POA: Diagnosis not present

## 2020-07-21 DIAGNOSIS — K5901 Slow transit constipation: Secondary | ICD-10-CM | POA: Diagnosis not present

## 2020-07-21 DIAGNOSIS — G35 Multiple sclerosis: Secondary | ICD-10-CM | POA: Diagnosis not present

## 2020-07-21 DIAGNOSIS — Z9884 Bariatric surgery status: Secondary | ICD-10-CM | POA: Diagnosis not present

## 2020-07-21 DIAGNOSIS — E7800 Pure hypercholesterolemia, unspecified: Secondary | ICD-10-CM | POA: Diagnosis not present

## 2020-07-21 DIAGNOSIS — K529 Noninfective gastroenteritis and colitis, unspecified: Secondary | ICD-10-CM | POA: Diagnosis not present

## 2020-07-29 ENCOUNTER — Ambulatory Visit: Payer: Medicare HMO

## 2020-07-31 ENCOUNTER — Other Ambulatory Visit: Payer: Self-pay

## 2020-07-31 DIAGNOSIS — N200 Calculus of kidney: Secondary | ICD-10-CM

## 2020-08-04 ENCOUNTER — Ambulatory Visit: Payer: Medicare HMO | Admitting: Urology

## 2020-08-11 DIAGNOSIS — F411 Generalized anxiety disorder: Secondary | ICD-10-CM | POA: Diagnosis not present

## 2020-08-11 DIAGNOSIS — F4310 Post-traumatic stress disorder, unspecified: Secondary | ICD-10-CM | POA: Diagnosis not present

## 2020-08-11 DIAGNOSIS — F331 Major depressive disorder, recurrent, moderate: Secondary | ICD-10-CM | POA: Diagnosis not present

## 2020-08-13 DIAGNOSIS — Z9049 Acquired absence of other specified parts of digestive tract: Secondary | ICD-10-CM | POA: Diagnosis not present

## 2020-08-13 DIAGNOSIS — R1319 Other dysphagia: Secondary | ICD-10-CM | POA: Diagnosis not present

## 2020-08-13 DIAGNOSIS — R197 Diarrhea, unspecified: Secondary | ICD-10-CM | POA: Diagnosis not present

## 2020-08-13 DIAGNOSIS — R0789 Other chest pain: Secondary | ICD-10-CM | POA: Diagnosis not present

## 2020-08-13 DIAGNOSIS — Z9884 Bariatric surgery status: Secondary | ICD-10-CM | POA: Diagnosis not present

## 2020-08-13 DIAGNOSIS — R1110 Vomiting, unspecified: Secondary | ICD-10-CM | POA: Diagnosis not present

## 2020-08-13 DIAGNOSIS — Z79899 Other long term (current) drug therapy: Secondary | ICD-10-CM | POA: Diagnosis not present

## 2020-08-31 DIAGNOSIS — G4709 Other insomnia: Secondary | ICD-10-CM | POA: Diagnosis not present

## 2020-08-31 DIAGNOSIS — F4310 Post-traumatic stress disorder, unspecified: Secondary | ICD-10-CM | POA: Diagnosis not present

## 2020-08-31 DIAGNOSIS — F411 Generalized anxiety disorder: Secondary | ICD-10-CM | POA: Diagnosis not present

## 2020-08-31 DIAGNOSIS — F330 Major depressive disorder, recurrent, mild: Secondary | ICD-10-CM | POA: Diagnosis not present

## 2020-09-02 DIAGNOSIS — F331 Major depressive disorder, recurrent, moderate: Secondary | ICD-10-CM | POA: Diagnosis not present

## 2020-09-02 DIAGNOSIS — Z9884 Bariatric surgery status: Secondary | ICD-10-CM | POA: Diagnosis not present

## 2020-09-02 DIAGNOSIS — K3189 Other diseases of stomach and duodenum: Secondary | ICD-10-CM | POA: Diagnosis not present

## 2020-09-02 DIAGNOSIS — K2950 Unspecified chronic gastritis without bleeding: Secondary | ICD-10-CM | POA: Diagnosis not present

## 2020-09-02 DIAGNOSIS — G43909 Migraine, unspecified, not intractable, without status migrainosus: Secondary | ICD-10-CM | POA: Diagnosis not present

## 2020-09-02 DIAGNOSIS — Z880 Allergy status to penicillin: Secondary | ICD-10-CM | POA: Diagnosis not present

## 2020-09-02 DIAGNOSIS — R1310 Dysphagia, unspecified: Secondary | ICD-10-CM | POA: Diagnosis not present

## 2020-09-02 DIAGNOSIS — K219 Gastro-esophageal reflux disease without esophagitis: Secondary | ICD-10-CM | POA: Diagnosis not present

## 2020-09-02 DIAGNOSIS — Z881 Allergy status to other antibiotic agents status: Secondary | ICD-10-CM | POA: Diagnosis not present

## 2020-09-04 ENCOUNTER — Ambulatory Visit: Payer: Medicare HMO

## 2020-10-12 ENCOUNTER — Ambulatory Visit: Payer: Medicare HMO | Admitting: Urology

## 2020-10-16 ENCOUNTER — Other Ambulatory Visit: Payer: Self-pay

## 2020-10-16 ENCOUNTER — Encounter: Payer: Self-pay | Admitting: Neurology

## 2020-10-16 DIAGNOSIS — Z9889 Other specified postprocedural states: Secondary | ICD-10-CM | POA: Diagnosis not present

## 2020-10-16 DIAGNOSIS — M65331 Trigger finger, right middle finger: Secondary | ICD-10-CM | POA: Diagnosis not present

## 2020-10-16 DIAGNOSIS — M65311 Trigger thumb, right thumb: Secondary | ICD-10-CM | POA: Diagnosis not present

## 2020-10-16 DIAGNOSIS — M5412 Radiculopathy, cervical region: Secondary | ICD-10-CM | POA: Diagnosis not present

## 2020-10-16 DIAGNOSIS — G894 Chronic pain syndrome: Secondary | ICD-10-CM | POA: Diagnosis not present

## 2020-10-16 DIAGNOSIS — R202 Paresthesia of skin: Secondary | ICD-10-CM

## 2020-10-22 DIAGNOSIS — R933 Abnormal findings on diagnostic imaging of other parts of digestive tract: Secondary | ICD-10-CM | POA: Diagnosis not present

## 2020-10-22 DIAGNOSIS — G8929 Other chronic pain: Secondary | ICD-10-CM | POA: Diagnosis not present

## 2020-10-22 DIAGNOSIS — R1031 Right lower quadrant pain: Secondary | ICD-10-CM | POA: Diagnosis not present

## 2020-10-26 DIAGNOSIS — G4700 Insomnia, unspecified: Secondary | ICD-10-CM | POA: Diagnosis not present

## 2020-10-26 DIAGNOSIS — F4310 Post-traumatic stress disorder, unspecified: Secondary | ICD-10-CM | POA: Diagnosis not present

## 2020-10-26 DIAGNOSIS — F330 Major depressive disorder, recurrent, mild: Secondary | ICD-10-CM | POA: Diagnosis not present

## 2020-10-27 DIAGNOSIS — Z0100 Encounter for examination of eyes and vision without abnormal findings: Secondary | ICD-10-CM | POA: Diagnosis not present

## 2020-10-27 DIAGNOSIS — H5210 Myopia, unspecified eye: Secondary | ICD-10-CM | POA: Diagnosis not present

## 2020-11-04 ENCOUNTER — Ambulatory Visit
Admission: RE | Admit: 2020-11-04 | Discharge: 2020-11-04 | Disposition: A | Discharge status: Home / Self Care | Payer: Medicare HMO | Source: Ambulatory Visit | Attending: Physician Assistant | Admitting: Physician Assistant

## 2020-11-04 ENCOUNTER — Other Ambulatory Visit: Payer: Self-pay

## 2020-11-04 DIAGNOSIS — Z1231 Encounter for screening mammogram for malignant neoplasm of breast: Secondary | ICD-10-CM | POA: Diagnosis not present

## 2020-11-10 DIAGNOSIS — K56699 Other intestinal obstruction unspecified as to partial versus complete obstruction: Secondary | ICD-10-CM | POA: Diagnosis not present

## 2020-11-12 DIAGNOSIS — E282 Polycystic ovarian syndrome: Secondary | ICD-10-CM | POA: Diagnosis not present

## 2020-11-12 DIAGNOSIS — R569 Unspecified convulsions: Secondary | ICD-10-CM | POA: Diagnosis not present

## 2020-11-12 DIAGNOSIS — R194 Change in bowel habit: Secondary | ICD-10-CM | POA: Diagnosis not present

## 2020-11-12 DIAGNOSIS — Z79899 Other long term (current) drug therapy: Secondary | ICD-10-CM | POA: Diagnosis not present

## 2020-11-12 DIAGNOSIS — K760 Fatty (change of) liver, not elsewhere classified: Secondary | ICD-10-CM | POA: Diagnosis not present

## 2020-11-12 DIAGNOSIS — Z980 Intestinal bypass and anastomosis status: Secondary | ICD-10-CM | POA: Diagnosis not present

## 2020-11-12 DIAGNOSIS — Z9889 Other specified postprocedural states: Secondary | ICD-10-CM | POA: Diagnosis not present

## 2020-11-12 DIAGNOSIS — R195 Other fecal abnormalities: Secondary | ICD-10-CM | POA: Diagnosis not present

## 2020-11-12 DIAGNOSIS — K219 Gastro-esophageal reflux disease without esophagitis: Secondary | ICD-10-CM | POA: Diagnosis not present

## 2020-11-25 ENCOUNTER — Encounter: Payer: Medicare HMO | Admitting: Neurology

## 2020-11-27 ENCOUNTER — Ambulatory Visit: Payer: Medicare HMO | Admitting: Urology

## 2020-12-09 DIAGNOSIS — R197 Diarrhea, unspecified: Secondary | ICD-10-CM | POA: Diagnosis not present

## 2020-12-09 DIAGNOSIS — K6289 Other specified diseases of anus and rectum: Secondary | ICD-10-CM | POA: Diagnosis not present

## 2020-12-09 DIAGNOSIS — Z9049 Acquired absence of other specified parts of digestive tract: Secondary | ICD-10-CM | POA: Diagnosis not present

## 2020-12-25 DIAGNOSIS — R0789 Other chest pain: Secondary | ICD-10-CM | POA: Insufficient documentation

## 2020-12-25 DIAGNOSIS — G5600 Carpal tunnel syndrome, unspecified upper limb: Secondary | ICD-10-CM | POA: Insufficient documentation

## 2020-12-25 DIAGNOSIS — T8484XD Pain due to internal orthopedic prosthetic devices, implants and grafts, subsequent encounter: Secondary | ICD-10-CM | POA: Insufficient documentation

## 2020-12-25 DIAGNOSIS — M2390 Unspecified internal derangement of unspecified knee: Secondary | ICD-10-CM | POA: Insufficient documentation

## 2020-12-25 DIAGNOSIS — Z9889 Other specified postprocedural states: Secondary | ICD-10-CM | POA: Insufficient documentation

## 2020-12-25 DIAGNOSIS — M5430 Sciatica, unspecified side: Secondary | ICD-10-CM | POA: Insufficient documentation

## 2020-12-25 DIAGNOSIS — M25569 Pain in unspecified knee: Secondary | ICD-10-CM | POA: Insufficient documentation

## 2020-12-25 DIAGNOSIS — M9689 Other intraoperative and postprocedural complications and disorders of the musculoskeletal system: Secondary | ICD-10-CM | POA: Insufficient documentation

## 2020-12-28 DIAGNOSIS — M25561 Pain in right knee: Secondary | ICD-10-CM | POA: Diagnosis not present

## 2020-12-28 DIAGNOSIS — M25562 Pain in left knee: Secondary | ICD-10-CM | POA: Diagnosis not present

## 2021-01-05 ENCOUNTER — Ambulatory Visit: Payer: Medicare HMO | Admitting: Neurology

## 2021-01-05 ENCOUNTER — Other Ambulatory Visit: Payer: Self-pay

## 2021-01-05 DIAGNOSIS — R202 Paresthesia of skin: Secondary | ICD-10-CM

## 2021-01-05 NOTE — Procedures (Signed)
[facility]  [place], [place]  [place], [place] [address] Tel: [phone] Fax:  [phone] Test Date:  01/05/2021  Patient: Denise Avila DOB: April 17, 1976 Physician: Narda Amber, DO  Sex: Female Height: 6' " Ref Phys: Devonne Doughty, MD  ID#: [id number]   Technician:    Patient Complaints: This is a 45 year old female s/p CTS release referred for evaluation of right hand paresthesias.  NCV & EMG Findings: Extensive electrodiagnostic testing of the right upper extremity shows: 1. Right median, ulnar, and mixed palmar sensory responses are within normal limits. 2. Right median and ulnar motor responses are within normal limits. 3. There is no evidence of active or chronic motor axonal loss changes affecting any of the tested muscles.  Motor unit configuration and recruitment pattern is within normal limits.  Impression: This is a normal study of the right upper extremity.  In particular, there is no evidence of carpal tunnel syndrome or cervical radiculopathy.   ___________________________ Narda Amber, DO    Nerve Conduction Studies Anti Sensory Summary Table   Stim Site NR Peak (ms) Norm Peak (ms) P-T Amp (V) Norm P-T Amp  Right Median Anti Sensory (2nd Digit)  36C  Wrist    2.8 <3.4 32.8 >20  Right Ulnar Anti Sensory (5th Digit)  36C  Wrist    2.6 <3.1 35.4 >12   Motor Summary Table   Stim Site NR Onset (ms) Norm Onset (ms) O-P Amp (mV) Norm O-P Amp Site1 Site2 Delta-0 (ms) Dist (cm) Vel (m/s) Norm Vel (m/s)  Right Median Motor (Abd Poll Brev)  36C  Wrist    2.8 <3.9 10.4 >6 Elbow Wrist 4.9 30.0 61 >50  Elbow    7.7  10.4         Right Ulnar Motor (Abd Dig Minimi)  36C  Wrist    1.8 <3.1 10.3 >7 B Elbow Wrist 4.2 25.0 60 >50  B Elbow    6.0  9.8  A Elbow B Elbow 1.8 10.0 56 >50  A Elbow    7.8  9.6          Comparison Summary Table   Stim Site NR Peak (ms) Norm Peak (ms) P-T Amp (V) Site1 Site2 Delta-P (ms) Norm Delta (ms)  Right  Median/Ulnar Palm Comparison (Wrist - 8cm)  36C  Median Palm    1.7 <2.2 67.8 Median Palm Ulnar Palm 0.2   Ulnar Palm    1.5 <2.2 12.9       EMG   Side Muscle Ins Act Fibs Psw Fasc Number Recrt Dur Dur. Amp Amp. Poly Poly. Comment  Right 1stDorInt Nml Nml Nml Nml Nml Nml Nml Nml Nml Nml Nml Nml N/A  Right PronatorTeres Nml Nml Nml Nml Nml Nml Nml Nml Nml Nml Nml Nml N/A  Right Biceps Nml Nml Nml Nml Nml Nml Nml Nml Nml Nml Nml Nml N/A  Right Triceps Nml Nml Nml Nml Nml Nml Nml Nml Nml Nml Nml Nml N/A  Right Deltoid Nml Nml Nml Nml Nml Nml Nml Nml Nml Nml Nml Nml N/A      Waveforms:

## 2021-01-18 DIAGNOSIS — J069 Acute upper respiratory infection, unspecified: Secondary | ICD-10-CM | POA: Diagnosis not present

## 2021-01-18 DIAGNOSIS — H9203 Otalgia, bilateral: Secondary | ICD-10-CM | POA: Diagnosis not present

## 2021-01-18 DIAGNOSIS — J029 Acute pharyngitis, unspecified: Secondary | ICD-10-CM | POA: Diagnosis not present

## 2021-01-22 DIAGNOSIS — M5412 Radiculopathy, cervical region: Secondary | ICD-10-CM | POA: Diagnosis not present

## 2021-01-22 DIAGNOSIS — M65331 Trigger finger, right middle finger: Secondary | ICD-10-CM | POA: Diagnosis not present

## 2021-01-22 DIAGNOSIS — Z9889 Other specified postprocedural states: Secondary | ICD-10-CM | POA: Diagnosis not present

## 2021-01-22 DIAGNOSIS — M65311 Trigger thumb, right thumb: Secondary | ICD-10-CM | POA: Diagnosis not present

## 2021-02-02 DIAGNOSIS — Z472 Encounter for removal of internal fixation device: Secondary | ICD-10-CM | POA: Diagnosis not present

## 2021-02-02 DIAGNOSIS — T8484XA Pain due to internal orthopedic prosthetic devices, implants and grafts, initial encounter: Secondary | ICD-10-CM | POA: Diagnosis not present

## 2021-02-08 DIAGNOSIS — F4310 Post-traumatic stress disorder, unspecified: Secondary | ICD-10-CM | POA: Diagnosis not present

## 2021-02-08 DIAGNOSIS — F330 Major depressive disorder, recurrent, mild: Secondary | ICD-10-CM | POA: Diagnosis not present

## 2021-02-08 DIAGNOSIS — G2581 Restless legs syndrome: Secondary | ICD-10-CM | POA: Diagnosis not present

## 2021-02-08 DIAGNOSIS — F411 Generalized anxiety disorder: Secondary | ICD-10-CM | POA: Diagnosis not present

## 2021-02-08 DIAGNOSIS — G4700 Insomnia, unspecified: Secondary | ICD-10-CM | POA: Diagnosis not present

## 2021-03-05 DIAGNOSIS — M5412 Radiculopathy, cervical region: Secondary | ICD-10-CM | POA: Diagnosis not present

## 2021-03-05 DIAGNOSIS — M65311 Trigger thumb, right thumb: Secondary | ICD-10-CM | POA: Diagnosis not present

## 2021-03-05 DIAGNOSIS — M65331 Trigger finger, right middle finger: Secondary | ICD-10-CM | POA: Diagnosis not present

## 2021-03-10 DIAGNOSIS — Z880 Allergy status to penicillin: Secondary | ICD-10-CM | POA: Diagnosis not present

## 2021-03-10 DIAGNOSIS — M65311 Trigger thumb, right thumb: Secondary | ICD-10-CM | POA: Diagnosis not present

## 2021-03-10 DIAGNOSIS — K7689 Other specified diseases of liver: Secondary | ICD-10-CM | POA: Diagnosis not present

## 2021-03-10 DIAGNOSIS — G35 Multiple sclerosis: Secondary | ICD-10-CM | POA: Diagnosis not present

## 2021-03-10 DIAGNOSIS — K219 Gastro-esophageal reflux disease without esophagitis: Secondary | ICD-10-CM | POA: Diagnosis not present

## 2021-03-10 DIAGNOSIS — E2740 Unspecified adrenocortical insufficiency: Secondary | ICD-10-CM | POA: Diagnosis not present

## 2021-03-10 DIAGNOSIS — M65331 Trigger finger, right middle finger: Secondary | ICD-10-CM | POA: Diagnosis not present

## 2021-03-10 DIAGNOSIS — Z888 Allergy status to other drugs, medicaments and biological substances status: Secondary | ICD-10-CM | POA: Diagnosis not present

## 2021-03-10 DIAGNOSIS — Z79899 Other long term (current) drug therapy: Secondary | ICD-10-CM | POA: Diagnosis not present

## 2021-03-15 DIAGNOSIS — F331 Major depressive disorder, recurrent, moderate: Secondary | ICD-10-CM | POA: Diagnosis not present

## 2021-03-15 DIAGNOSIS — G4719 Other hypersomnia: Secondary | ICD-10-CM | POA: Diagnosis not present

## 2021-04-13 DIAGNOSIS — M79644 Pain in right finger(s): Secondary | ICD-10-CM | POA: Diagnosis not present

## 2021-04-13 DIAGNOSIS — M5441 Lumbago with sciatica, right side: Secondary | ICD-10-CM | POA: Diagnosis not present

## 2021-04-13 DIAGNOSIS — R0789 Other chest pain: Secondary | ICD-10-CM | POA: Diagnosis not present

## 2021-04-13 DIAGNOSIS — M797 Fibromyalgia: Secondary | ICD-10-CM | POA: Diagnosis not present

## 2021-04-13 DIAGNOSIS — E7800 Pure hypercholesterolemia, unspecified: Secondary | ICD-10-CM | POA: Diagnosis not present

## 2021-04-13 DIAGNOSIS — M533 Sacrococcygeal disorders, not elsewhere classified: Secondary | ICD-10-CM | POA: Diagnosis not present

## 2021-04-13 DIAGNOSIS — G894 Chronic pain syndrome: Secondary | ICD-10-CM | POA: Diagnosis not present

## 2021-06-03 DIAGNOSIS — G43011 Migraine without aura, intractable, with status migrainosus: Secondary | ICD-10-CM | POA: Diagnosis not present

## 2021-06-03 DIAGNOSIS — M542 Cervicalgia: Secondary | ICD-10-CM | POA: Diagnosis not present

## 2021-06-16 DIAGNOSIS — F411 Generalized anxiety disorder: Secondary | ICD-10-CM | POA: Diagnosis not present

## 2021-06-16 DIAGNOSIS — F4310 Post-traumatic stress disorder, unspecified: Secondary | ICD-10-CM | POA: Diagnosis not present

## 2021-06-16 DIAGNOSIS — R29818 Other symptoms and signs involving the nervous system: Secondary | ICD-10-CM | POA: Diagnosis not present

## 2021-06-25 DIAGNOSIS — N200 Calculus of kidney: Secondary | ICD-10-CM | POA: Diagnosis not present

## 2021-06-25 DIAGNOSIS — R1031 Right lower quadrant pain: Secondary | ICD-10-CM | POA: Diagnosis not present

## 2021-06-25 DIAGNOSIS — R112 Nausea with vomiting, unspecified: Secondary | ICD-10-CM | POA: Diagnosis not present

## 2021-06-25 DIAGNOSIS — K760 Fatty (change of) liver, not elsewhere classified: Secondary | ICD-10-CM | POA: Diagnosis not present

## 2021-06-25 DIAGNOSIS — Z9049 Acquired absence of other specified parts of digestive tract: Secondary | ICD-10-CM | POA: Diagnosis not present

## 2021-06-25 DIAGNOSIS — K529 Noninfective gastroenteritis and colitis, unspecified: Secondary | ICD-10-CM | POA: Diagnosis not present

## 2021-06-25 DIAGNOSIS — R509 Fever, unspecified: Secondary | ICD-10-CM | POA: Diagnosis not present

## 2021-06-25 DIAGNOSIS — Z8739 Personal history of other diseases of the musculoskeletal system and connective tissue: Secondary | ICD-10-CM | POA: Diagnosis not present

## 2021-06-25 DIAGNOSIS — K625 Hemorrhage of anus and rectum: Secondary | ICD-10-CM | POA: Diagnosis not present

## 2021-06-25 DIAGNOSIS — N2889 Other specified disorders of kidney and ureter: Secondary | ICD-10-CM | POA: Diagnosis not present

## 2021-06-25 DIAGNOSIS — R197 Diarrhea, unspecified: Secondary | ICD-10-CM | POA: Diagnosis not present

## 2021-06-25 DIAGNOSIS — K921 Melena: Secondary | ICD-10-CM | POA: Diagnosis not present

## 2021-06-28 ENCOUNTER — Ambulatory Visit: Payer: Medicare HMO | Admitting: Urology

## 2021-06-28 ENCOUNTER — Other Ambulatory Visit: Payer: Self-pay

## 2021-06-28 ENCOUNTER — Encounter: Payer: Self-pay | Admitting: Urology

## 2021-06-28 VITALS — BP 104/72 | HR 94

## 2021-06-28 DIAGNOSIS — N200 Calculus of kidney: Secondary | ICD-10-CM | POA: Diagnosis not present

## 2021-06-28 LAB — URINALYSIS, ROUTINE W REFLEX MICROSCOPIC
Bilirubin, UA: NEGATIVE
Glucose, UA: NEGATIVE
Ketones, UA: NEGATIVE
Nitrite, UA: NEGATIVE
Specific Gravity, UA: 1.025 (ref 1.005–1.030)
Urobilinogen, Ur: 0.2 mg/dL (ref 0.2–1.0)
pH, UA: 7 (ref 5.0–7.5)

## 2021-06-28 LAB — MICROSCOPIC EXAMINATION
RBC, Urine: >30 /hpf — AB (ref 0–2)
Renal Epithel, UA: NONE SEEN /hpf

## 2021-06-28 MED ORDER — OXYCODONE-ACETAMINOPHEN 5-325 MG PO TABS: 1.0000 | ORAL_TABLET | ORAL | 0 refills | Status: DC | PRN

## 2021-06-28 NOTE — H&P (View-Only) (Signed)
06/28/2021 2:09 PM   Denise Avila 28-May-1976 [id number]  Referring provider: Lennie Odor, [place] [employer] [place],  [place] [address]  Right renal calculus   HPI: [name] is a 45yo here for evaluation of a right renal calculus. She was seen at [facility] 9/23 due to severe right flank pain for 2-3 weeks and was diagnosed with a 66mm right UPJ calculus. The stone was previously 70mm. She denies any fevers. She has nausea but no vomiting. Pain is currently sharp, intermittent, severe and nonraditing. No exacerbating/alleviating events. No other associated symtpoms.    PMH: Past Medical History:  Diagnosis Date   Abnormal Pap smear of cervix 12/2014   Acid reflux    Adrenal insufficiency (HCC)    Anxiety    Arthritis    Arthritis    Depression    Depression    Endometriosis    Fibroids    age 66   High cholesterol    History of kidney stones    Liver disease    per pt report, will have biopsy Nov 08, 2017   Migraines    Pleurisy 02/2015   while hospitalized    Seizure ([place])    Skin cancer (melanoma) ([place])    Syncope and collapse    Uterine cancer ([place])     Surgical History: Past Surgical History:  Procedure Laterality Date   ABDOMINAL HYSTERECTOMY     ABLATION     uterine   COLECTOMY     CYSTOSCOPY W/ URETERAL STENT PLACEMENT Left 02/02/2015   Procedure: CYSTOSCOPY WITH RETROGRADE PYELOGRAM/URETERAL STENT PLACEMENT;  Surgeon: Festus Aloe, MD;  Location: [facility];  Service: Urology;  Laterality: Left;   CYSTOSCOPY WITH URETEROSCOPY AND STENT PLACEMENT Left 02/13/2015   Procedure: LEFT URETEROSCOPY WITH HOLMIUM LASER AND STENT PLACEMENT;  Surgeon: Festus Aloe, MD;  Location: [facility];  Service: Urology;  Laterality: Left;   DILATION AND CURETTAGE OF UTERUS     ESOPHAGOGASTRODUODENOSCOPY ENDOSCOPY  10/11/2017   @ [facility]; BIOPSY OF LOWER INTESTINE   HOLMIUM LASER APPLICATION N/A 4/48/1856   Procedure: HOLMIUM LASER APPLICATION;  Surgeon: Festus Aloe, MD;  Location: [facility];  Service: Urology;  Laterality: N/A;   KNEE SURGERY  2019   LAPAROSCOPIC GASTRIC SLEEVE RESECTION     LAPAROSCOPY     x 4   MELANOMA EXCISION  2000   melanoma removed from face, basal cell removed from nose   REPAIR VAGINAL CUFF N/A 08/21/2015   Procedure: REPAIR VAGINAL CUFF, EXAM UNDER ANESTHESIA;  Surgeon: Everitt Amber, MD;  Location: [facility];  Service: Gynecology;  Laterality: N/A;   ROBOTIC ASSISTED TOTAL HYSTERECTOMY WITH BILATERAL SALPINGO OOPHERECTOMY Bilateral 05/21/2015   Procedure: XI ROBOTIC ASSISTED TOTAL HYSTERECTOMY WITH BILATERAL SALPINGO OOPHORECTOMY;  Surgeon: Everitt Amber, MD;  Location: [facility];  Service: Gynecology;  Laterality: Bilateral;    Home Medications:  Allergies as of 06/28/2021       Reactions   Penicillins Swelling, Rash   Rash all over [place] and lip swelling. Has patient had a PCN reaction causing immediate rash, facial/tongue/throat swelling, SOB or lightheadedness with hypotension: Yes Has patient had a PCN reaction causing severe rash involving mucus membranes or skin necrosis: Yes Has patient had a PCN reaction that required hospitalization: No If all of the above answers are "NO", then may proceed with Cephalosporin use.   Nortriptyline Other (See Comments)   Other reaction(s): Other (See Comments), Unknown Severe chest pain  Severe chest pain  Nsaids Other (See Comments)   Other reaction(s): Unknown   Amoxicillin Rash   Rash all over [place] Rash all over [place] Rash all over [place] Rash all over [place]   Ciprofloxacin Diarrhea, Nausea Only        Medication List        Accurate as of June 28, 2021  2:09 PM. If you have any questions, ask your nurse or doctor.          STOP taking these medications    Myrbetriq 25 MG Tb24 tablet Generic drug: mirabegron ER Stopped by: Nicolette Bang, MD   sulfamethoxazole-trimethoprim 800-160 MG tablet Commonly known as: BACTRIM DS Stopped by: Nicolette Bang, MD       TAKE these medications    baclofen 10 MG tablet Commonly known as: LIORESAL Take by mouth.   beta carotene w/minerals tablet Take 1 tablet by mouth daily.   Vitamins/Minerals Tabs Take by mouth.   cefadroxil 1 g tablet Commonly known as: DURICEF   celecoxib 200 MG capsule Commonly known as: CELEBREX   clonazePAM 1 MG tablet Commonly known as: KLONOPIN Take 1 mg by mouth 3 (three) times daily.   diphenoxylate-atropine 2.5-0.025 MG tablet Commonly known as: LOMOTIL   DULoxetine 60 MG capsule Commonly known as: CYMBALTA Take 60 mg by mouth daily.   DULoxetine 30 MG capsule Commonly known as: CYMBALTA   ezetimibe 10 MG tablet Commonly known as: ZETIA   gabapentin 800 MG tablet Commonly known as: NEURONTIN Take 800 tablets by mouth 2 (two) times daily.   LamoTRIgine 50 MG Tb24 24 hour tablet Take 200 mg by mouth every evening.   methenamine 1 g tablet Commonly known as: MANDELAMINE Take 1 tablet (1,000 mg total) by mouth 2 (two) times daily.   naproxen 500 MG tablet Commonly known as: NAPROSYN Take 500 mg by mouth 2 (two) times daily as needed.   nortriptyline 75 MG capsule Commonly known as: PAMELOR   omeprazole 20 MG capsule Commonly known as: PRILOSEC Take 20 mg by mouth daily.   omeprazole 40 MG capsule Commonly known as: PRILOSEC   promethazine 12.5 MG tablet Commonly known as: PHENERGAN Take 12.5 mg by mouth 2 (two) times daily.   QUEtiapine 25 MG tablet Commonly known as: SEROQUEL Take by mouth.   rizatriptan 10 MG tablet Commonly known as: MAXALT   rosuvastatin 5 MG tablet Commonly known as: CRESTOR   sucralfate 1 g tablet Commonly known as: Carafate Take 1 tablet (1 g total) by mouth 4 (four) times daily. What changed:  how much to take when to take this   tiZANidine 4 MG tablet Commonly known as: ZANAFLEX Take 12 mg by mouth at bedtime.   tiZANidine 2 MG tablet Commonly known as: ZANAFLEX   Toviaz 4  MG Tb24 tablet Generic drug: fesoterodine TAKE 1 TABLET(4 MG) BY MOUTH DAILY        Allergies:  Allergies  Allergen Reactions   Penicillins Swelling and Rash    Rash all over [place] and lip swelling. Has patient had a PCN reaction causing immediate rash, facial/tongue/throat swelling, SOB or lightheadedness with hypotension: Yes Has patient had a PCN reaction causing severe rash involving mucus membranes or skin necrosis: Yes Has patient had a PCN reaction that required hospitalization: No If all of the above answers are "NO", then may proceed with Cephalosporin use.   Nortriptyline Other (See Comments)    Other reaction(s): Other (See Comments), Unknown Severe chest pain  Severe chest pain  Nsaids Other (See Comments)    Other reaction(s): Unknown    Amoxicillin Rash    Rash all over [place] Rash all over [place] Rash all over [place] Rash all over [place]   Ciprofloxacin Diarrhea and Nausea Only    Family History: Family History  Problem Relation Age of Onset   Hypertension Mother    Melanoma Mother    Breast cancer Mother 56   Cancer - Other Father        liver   Diabetes Mellitus II Maternal Grandmother    Heart disease Maternal Grandmother    Breast cancer Maternal Grandmother    Cancer - Lung Maternal Grandfather    Osteoporosis Paternal Grandmother    Melanoma Paternal Grandfather     Social History:  reports that she has never smoked. She has never used smokeless tobacco. She reports that she does not drink alcohol and does not use drugs.  ROS: All other review of systems were reviewed and are negative except what is noted above in HPI  Physical Exam: BP 104/72   Pulse 94   LMP 05/04/2015 (Approximate)   Constitutional:  Alert and oriented, No acute distress. HEENT: [place] AT, moist mucus membranes.  Trachea midline, no masses. Cardiovascular: No clubbing, cyanosis, or edema. Respiratory: Normal respiratory effort, no increased work of breathing. GI:  Abdomen is soft, nontender, nondistended, no abdominal masses GU: No CVA tenderness.  Lymph: No cervical or inguinal lymphadenopathy. Skin: No rashes, bruises or suspicious lesions. Neurologic: Grossly intact, no focal deficits, moving all 4 extremities. Psychiatric: Normal mood and affect.  Laboratory Data: Lab Results  Component Value Date   WBC 6.8 02/24/2020   HGB 13.6 02/24/2020   HCT 42.3 02/24/2020   MCV 92.8 02/24/2020   PLT 155 02/24/2020    Lab Results  Component Value Date   CREATININE 0.65 02/24/2020    No results found for: PSA  No results found for: TESTOSTERONE  No results found for: HGBA1C  Urinalysis    Component Value Date/Time   COLORURINE YELLOW (A) 06/21/2017 1735   APPEARANCEUR CLOUDY (A) 06/21/2017 1735   LABSPEC 1.019 06/21/2017 1735   PHURINE 5.0 06/21/2017 1735   GLUCOSEU NEGATIVE 06/21/2017 1735   HGBUR NEGATIVE 06/21/2017 1735   BILIRUBINUR neg 01/27/2020 1124   KETONESUR NEGATIVE 06/21/2017 1735   PROTEINUR Positive (A) 01/27/2020 1124   PROTEINUR NEGATIVE 06/21/2017 1735   UROBILINOGEN 0.2 01/27/2020 1124   UROBILINOGEN 1.0 05/18/2015 1359   NITRITE neg 01/27/2020 1124   NITRITE NEGATIVE 06/21/2017 1735   LEUKOCYTESUR Negative 01/27/2020 1124    Lab Results  Component Value Date   BACTERIA NONE SEEN 06/21/2017    Pertinent Imaging:  Results for orders placed during the hospital encounter of 11/27/15  DG Abd 1 View  Narrative CLINICAL DATA:  Constipation, Sitz mark study ; markers ingested 5 days ago, left upper quadrant and left lower quadrant abdominal pain.  EXAM: ABDOMEN - 1 VIEW  COMPARISON:  KUB images of November 25, 2015  FINDINGS: There is a single Sitz marker that projects over the distal transverse colon. There are 3 Sitz markers that project in the pelvis likely in the sigmoid. The stool burden within the ascending and transverse colon is moderate. The descending and rectosigmoid colonic stool burden is  relatively small. There is no small bowel obstruction. The bony structures are unremarkable.  IMPRESSION: There has been further progression of the Sitz markers. The colonic stool burden in the right and transverse colon is moderate.  Electronically Signed By: David  Martinique M.D. On: 11/27/2015 14:10  No results found for this or any previous visit.  No results found for this or any previous visit.  No results found for this or any previous visit.  Results for orders placed during the hospital encounter of 01/01/18  US RENAL  Narrative CLINICAL DATA:  Pelvic pain.  EXAM: RENAL / URINARY TRACT ULTRASOUND COMPLETE  COMPARISON:  None.  FINDINGS: Right Kidney:  Length: 12.1 cm. Echogenicity within normal limits. No mass or hydronephrosis visualized.  Left Kidney:  Length: 12.5 cm. Echogenicity within normal limits. No mass or hydronephrosis visualized.  Bladder:  Appears normal for degree of bladder distention. BILATERAL ureteral jets.  IMPRESSION: Negative.   Electronically Signed By: Staci Righter M.D. On: 01/01/2018 13:49  No results found for this or any previous visit.  No results found for this or any previous visit.  Results for orders placed during the hospital encounter of 01/01/20  CT RENAL STONE STUDY  Narrative CLINICAL DATA:  Right flank pain radiating to right groin. Hematuria. Urinary frequency. History of kidney stones.  EXAM: CT ABDOMEN AND PELVIS WITHOUT CONTRAST  TECHNIQUE: Multidetector CT imaging of the abdomen and pelvis was performed following the standard protocol without IV contrast.  COMPARISON:  08/21/2015  FINDINGS: Lower chest: Lung bases are clear.  Hepatobiliary: Unenhanced liver is unremarkable.  Gallbladder is unremarkable. No intrahepatic or extrahepatic ductal dilatation.  Pancreas: Within normal limits.  Spleen: Within normal limits.  Adrenals/Urinary Tract: Adrenal glands are within normal  limits.  Suspected 14 mm cyst in the posterior right upper kidney (series 2/image 28), poorly evaluated. 3 mm nonobstructing right lower pole renal calculus (series 2/image 40). Left kidney is within normal limits.  No ureteral or bladder calculi.  No hydronephrosis.  Bladder is within normal limits.  Stomach/Bowel: Postsurgical changes involving the stomach.  Status post subtotal colectomy with ileorectal anastomosis.  No evidence of bowel obstruction.  Appendix is surgically absent.  Vascular/Lymphatic: No evidence of abdominal aortic aneurysm.  11 mm short axis node in the right mid abdomen, likely reflecting an ileocolic node (series 2/image 85), borderline enlarged.  Reproductive: Status post hysterectomy.  No adnexal masses.  Other: No abdominopelvic ascites.  Musculoskeletal: Visualized osseous structures are within normal limits.  IMPRESSION: 3 mm nonobstructing right lower pole renal calculus. No ureteral or bladder calculi. No hydronephrosis.  Status post subtotal colectomy with ileorectal anastomosis. Additional postsurgical changes as above.  11 mm short axis ileocolic node in the right mid abdomen. In the absence of known malignancy, this is likely reactive given adjacent postsurgical changes. In the setting of malignancy, consider follow-up in 3-6 months.   Electronically Signed By: Julian Hy M.D. On: 01/01/2020 21:17   Assessment & Plan:    1. Kidney stones -We discussed the management of kidney stones. These options include observation, ureteroscopy, shockwave lithotripsy (ESWL) and percutaneous nephrolithotomy (PCNL). We discussed which options are relevant to the patient's stone(s). We discussed the natural history of kidney stones as well as the complications of untreated stones and the impact on quality of life without treatment as well as with each of the above listed treatments. We also discussed the efficacy of each treatment in its  ability to clear the stone burden. With any of these management options I discussed the signs and symptoms of infection and the need for emergent treatment should these be experienced. For each option we discussed the ability of each procedure to clear the patient of their  stone burden.   For observation I described the risks which include but are not limited to silent renal damage, life-threatening infection, need for emergent surgery, failure to pass stone and pain.   For ureteroscopy I described the risks which include bleeding, infection, damage to contiguous structures, positioning injury, ureteral stricture, ureteral avulsion, ureteral injury, need for prolonged ureteral stent, inability to perform ureteroscopy, need for an interval procedure, inability to clear stone burden, stent discomfort/pain, heart attack, stroke, pulmonary embolus and the inherent risks with general anesthesia.   For shockwave lithotripsy I described the risks which include arrhythmia, kidney contusion, kidney hemorrhage, need for transfusion, pain, inability to adequately break up stone, inability to pass stone fragments, Steinstrasse, infection associated with obstructing stones, need for alternate surgical procedure, need for repeat shockwave lithotripsy, MI, CVA, PE and the inherent risks with anesthesia/conscious sedation.   For PCNL I described the risks including positioning injury, pneumothorax, hydrothorax, need for chest tube, inability to clear stone burden, renal laceration, arterial venous fistula or malformation, need for embolization of kidney, loss of kidney or renal function, need for repeat procedure, need for prolonged nephrostomy tube, ureteral avulsion, MI, CVA, PE and the inherent risks of general anesthesia.   - The patient would like to proceed with right percutaneous nephrolithotomy.  - Urinalysis, Routine w reflex microscopic   No follow-ups on file.  Nicolette Bang, MD  [facility]  [place]

## 2021-06-28 NOTE — Progress Notes (Signed)
06/28/2021 2:09 PM   [name] 1976/03/14 [id number]  Referring provider: Lennie Odor, [address] [employer] [address] [place],  [place] [address]  Right renal calculus   HPI: Ms Lizaola is a 45yo here for evaluation of a right renal calculus. She was seen at [facility] 9/23 due to severe right flank pain for 2-3 weeks and was diagnosed with a 52mm right UPJ calculus. The stone was previously 20mm. She denies any fevers. She has nausea but no vomiting. Pain is currently sharp, intermittent, severe and nonraditing. No exacerbating/alleviating events. No other associated symtpoms.    PMH: Past Medical History:  Diagnosis Date   Abnormal Pap smear of cervix 12/2014   Acid reflux    Adrenal insufficiency (HCC)    Anxiety    Arthritis    Arthritis    Depression    Depression    Endometriosis    Fibroids    age 70   High cholesterol    History of kidney stones    Liver disease    per pt report, will have biopsy Nov 08, 2017   Migraines    Pleurisy 02/2015   while hospitalized    Seizure ([place])    Skin cancer (melanoma) ([place])    Syncope and collapse    Uterine cancer ([place])     Surgical History: Past Surgical History:  Procedure Laterality Date   ABDOMINAL HYSTERECTOMY     ABLATION     uterine   COLECTOMY     CYSTOSCOPY W/ URETERAL STENT PLACEMENT Left 02/02/2015   Procedure: CYSTOSCOPY WITH RETROGRADE PYELOGRAM/URETERAL STENT PLACEMENT;  Surgeon: Festus Aloe, MD;  Location: [facility];  Service: Urology;  Laterality: Left;   CYSTOSCOPY WITH URETEROSCOPY AND STENT PLACEMENT Left 02/13/2015   Procedure: LEFT URETEROSCOPY WITH HOLMIUM LASER AND STENT PLACEMENT;  Surgeon: Festus Aloe, MD;  Location: [facility];  Service: Urology;  Laterality: Left;   DILATION AND CURETTAGE OF UTERUS     ESOPHAGOGASTRODUODENOSCOPY ENDOSCOPY  10/11/2017   @ [facility]; BIOPSY OF LOWER INTESTINE   HOLMIUM LASER APPLICATION N/A [id number]/7106   Procedure: HOLMIUM LASER APPLICATION;  Surgeon: Festus Aloe, MD;  Location: [facility];  Service: Urology;  Laterality: N/A;   KNEE SURGERY  2019   LAPAROSCOPIC GASTRIC SLEEVE RESECTION     LAPAROSCOPY     x 4   MELANOMA EXCISION  2000   melanoma removed from face, basal cell removed from nose   REPAIR VAGINAL CUFF N/A 08/21/2015   Procedure: REPAIR VAGINAL CUFF, EXAM UNDER ANESTHESIA;  Surgeon: Everitt Amber, MD;  Location: [facility];  Service: Gynecology;  Laterality: N/A;   ROBOTIC ASSISTED TOTAL HYSTERECTOMY WITH BILATERAL SALPINGO OOPHERECTOMY Bilateral 05/21/2015   Procedure: XI ROBOTIC ASSISTED TOTAL HYSTERECTOMY WITH BILATERAL SALPINGO OOPHORECTOMY;  Surgeon: Everitt Amber, MD;  Location: [facility];  Service: Gynecology;  Laterality: Bilateral;    Home Medications:  Allergies as of 06/28/2021       Reactions   Penicillins Swelling, Rash   Rash all over [place] and lip swelling. Has patient had a PCN reaction causing immediate rash, facial/tongue/throat swelling, SOB or lightheadedness with hypotension: Yes Has patient had a PCN reaction causing severe rash involving mucus membranes or skin necrosis: Yes Has patient had a PCN reaction that required hospitalization: No If all of the above answers are "NO", then may proceed with Cephalosporin use.   Nortriptyline Other (See Comments)   Other reaction(s): Other (See Comments), Unknown Severe chest pain  Severe chest pain  Nsaids Other (See Comments)   Other reaction(s): Unknown   Amoxicillin Rash   Rash all over [place] Rash all over [place] Rash all over [place] Rash all over [place]   Ciprofloxacin Diarrhea, Nausea Only        Medication List        Accurate as of June 28, 2021  2:09 PM. If you have any questions, ask your nurse or doctor.          STOP taking these medications    Myrbetriq 25 MG Tb24 tablet Generic drug: mirabegron ER Stopped by: Nicolette Bang, MD   sulfamethoxazole-trimethoprim 800-160 MG tablet Commonly known as: BACTRIM DS Stopped by: Nicolette Bang, MD       TAKE these medications    baclofen 10 MG tablet Commonly known as: LIORESAL Take by mouth.   beta carotene w/minerals tablet Take 1 tablet by mouth daily.   Vitamins/Minerals Tabs Take by mouth.   cefadroxil 1 g tablet Commonly known as: DURICEF   celecoxib 200 MG capsule Commonly known as: CELEBREX   clonazePAM 1 MG tablet Commonly known as: KLONOPIN Take 1 mg by mouth 3 (three) times daily.   diphenoxylate-atropine 2.5-0.025 MG tablet Commonly known as: LOMOTIL   DULoxetine 60 MG capsule Commonly known as: CYMBALTA Take 60 mg by mouth daily.   DULoxetine 30 MG capsule Commonly known as: CYMBALTA   ezetimibe 10 MG tablet Commonly known as: ZETIA   gabapentin 800 MG tablet Commonly known as: NEURONTIN Take 800 tablets by mouth 2 (two) times daily.   LamoTRIgine 50 MG Tb24 24 hour tablet Take 200 mg by mouth every evening.   methenamine 1 g tablet Commonly known as: MANDELAMINE Take 1 tablet (1,000 mg total) by mouth 2 (two) times daily.   naproxen 500 MG tablet Commonly known as: NAPROSYN Take 500 mg by mouth 2 (two) times daily as needed.   nortriptyline 75 MG capsule Commonly known as: PAMELOR   omeprazole 20 MG capsule Commonly known as: PRILOSEC Take 20 mg by mouth daily.   omeprazole 40 MG capsule Commonly known as: PRILOSEC   promethazine 12.5 MG tablet Commonly known as: PHENERGAN Take 12.5 mg by mouth 2 (two) times daily.   QUEtiapine 25 MG tablet Commonly known as: SEROQUEL Take by mouth.   rizatriptan 10 MG tablet Commonly known as: MAXALT   rosuvastatin 5 MG tablet Commonly known as: CRESTOR   sucralfate 1 g tablet Commonly known as: Carafate Take 1 tablet (1 g total) by mouth 4 (four) times daily. What changed:  how much to take when to take this   tiZANidine 4 MG tablet Commonly known as: ZANAFLEX Take 12 mg by mouth at bedtime.   tiZANidine 2 MG tablet Commonly known as: ZANAFLEX   Toviaz 4  MG Tb24 tablet Generic drug: fesoterodine TAKE 1 TABLET(4 MG) BY MOUTH DAILY        Allergies:  Allergies  Allergen Reactions   Penicillins Swelling and Rash    Rash all over [place] and lip swelling. Has patient had a PCN reaction causing immediate rash, facial/tongue/throat swelling, SOB or lightheadedness with hypotension: Yes Has patient had a PCN reaction causing severe rash involving mucus membranes or skin necrosis: Yes Has patient had a PCN reaction that required hospitalization: No If all of the above answers are "NO", then may proceed with Cephalosporin use.   Nortriptyline Other (See Comments)    Other reaction(s): Other (See Comments), Unknown Severe chest pain  Severe chest pain  Nsaids Other (See Comments)    Other reaction(s): Unknown    Amoxicillin Rash    Rash all over [place] Rash all over [place] Rash all over [place] Rash all over [place]   Ciprofloxacin Diarrhea and Nausea Only    Family History: Family History  Problem Relation Age of Onset   Hypertension Mother    Melanoma Mother    Breast cancer Mother 39   Cancer - Other Father        liver   Diabetes Mellitus II Maternal Grandmother    Heart disease Maternal Grandmother    Breast cancer Maternal Grandmother    Cancer - Lung Maternal Grandfather    Osteoporosis Paternal Grandmother    Melanoma Paternal Grandfather     Social History:  reports that she has never smoked. She has never used smokeless tobacco. She reports that she does not drink alcohol and does not use drugs.  ROS: All other review of systems were reviewed and are negative except what is noted above in HPI  Physical Exam: BP 104/72   Pulse 94   LMP 05/04/2015 (Approximate)   Constitutional:  Alert and oriented, No acute distress. HEENT: [place] AT, moist mucus membranes.  Trachea midline, no masses. Cardiovascular: No clubbing, cyanosis, or edema. Respiratory: Normal respiratory effort, no increased work of breathing. GI:  Abdomen is soft, nontender, nondistended, no abdominal masses GU: No CVA tenderness.  Lymph: No cervical or inguinal lymphadenopathy. Skin: No rashes, bruises or suspicious lesions. Neurologic: Grossly intact, no focal deficits, moving all 4 extremities. Psychiatric: Normal mood and affect.  Laboratory Data: Lab Results  Component Value Date   WBC 6.8 02/24/2020   HGB 13.6 02/24/2020   HCT 42.3 02/24/2020   MCV 92.8 02/24/2020   PLT 155 02/24/2020    Lab Results  Component Value Date   CREATININE 0.65 02/24/2020    No results found for: PSA  No results found for: TESTOSTERONE  No results found for: HGBA1C  Urinalysis    Component Value Date/Time   COLORURINE YELLOW (A) 06/21/2017 1735   APPEARANCEUR CLOUDY (A) 06/21/2017 1735   LABSPEC 1.019 06/21/2017 1735   PHURINE 5.0 06/21/2017 1735   GLUCOSEU NEGATIVE 06/21/2017 1735   HGBUR NEGATIVE 06/21/2017 1735   BILIRUBINUR neg 01/27/2020 1124   KETONESUR NEGATIVE 06/21/2017 1735   PROTEINUR Positive (A) 01/27/2020 1124   PROTEINUR NEGATIVE 06/21/2017 1735   UROBILINOGEN 0.2 01/27/2020 1124   UROBILINOGEN 1.0 05/18/2015 1359   NITRITE neg 01/27/2020 1124   NITRITE NEGATIVE 06/21/2017 1735   LEUKOCYTESUR Negative 01/27/2020 1124    Lab Results  Component Value Date   BACTERIA NONE SEEN 06/21/2017    Pertinent Imaging:  Results for orders placed during the hospital encounter of 11/27/15  DG Abd 1 View  Narrative CLINICAL DATA:  Constipation, Sitz mark study ; markers ingested 5 days ago, left upper quadrant and left lower quadrant abdominal pain.  EXAM: ABDOMEN - 1 VIEW  COMPARISON:  KUB images of November 25, 2015  FINDINGS: There is a single Sitz marker that projects over the distal transverse colon. There are 3 Sitz markers that project in the pelvis likely in the sigmoid. The stool burden within the ascending and transverse colon is moderate. The descending and rectosigmoid colonic stool burden is  relatively small. There is no small bowel obstruction. The bony structures are unremarkable.  IMPRESSION: There has been further progression of the Sitz markers. The colonic stool burden in the right and transverse colon is moderate.  Electronically Signed By: David  Martinique M.D. On: 11/27/2015 14:10  No results found for this or any previous visit.  No results found for this or any previous visit.  No results found for this or any previous visit.  Results for orders placed during the hospital encounter of 01/01/18  US RENAL  Narrative CLINICAL DATA:  Pelvic pain.  EXAM: RENAL / URINARY TRACT ULTRASOUND COMPLETE  COMPARISON:  None.  FINDINGS: Right Kidney:  Length: 12.1 cm. Echogenicity within normal limits. No mass or hydronephrosis visualized.  Left Kidney:  Length: 12.5 cm. Echogenicity within normal limits. No mass or hydronephrosis visualized.  Bladder:  Appears normal for degree of bladder distention. BILATERAL ureteral jets.  IMPRESSION: Negative.   Electronically Signed By: Staci Righter M.D. On: 01/01/2018 13:49  No results found for this or any previous visit.  No results found for this or any previous visit.  Results for orders placed during the hospital encounter of 01/01/20  CT RENAL STONE STUDY  Narrative CLINICAL DATA:  Right flank pain radiating to right groin. Hematuria. Urinary frequency. History of kidney stones.  EXAM: CT ABDOMEN AND PELVIS WITHOUT CONTRAST  TECHNIQUE: Multidetector CT imaging of the abdomen and pelvis was performed following the standard protocol without IV contrast.  COMPARISON:  08/21/2015  FINDINGS: Lower chest: Lung bases are clear.  Hepatobiliary: Unenhanced liver is unremarkable.  Gallbladder is unremarkable. No intrahepatic or extrahepatic ductal dilatation.  Pancreas: Within normal limits.  Spleen: Within normal limits.  Adrenals/Urinary Tract: Adrenal glands are within normal  limits.  Suspected 14 mm cyst in the posterior right upper kidney (series 2/image 28), poorly evaluated. 3 mm nonobstructing right lower pole renal calculus (series 2/image 40). Left kidney is within normal limits.  No ureteral or bladder calculi.  No hydronephrosis.  Bladder is within normal limits.  Stomach/Bowel: Postsurgical changes involving the stomach.  Status post subtotal colectomy with ileorectal anastomosis.  No evidence of bowel obstruction.  Appendix is surgically absent.  Vascular/Lymphatic: No evidence of abdominal aortic aneurysm.  11 mm short axis node in the right mid abdomen, likely reflecting an ileocolic node (series 2/image 85), borderline enlarged.  Reproductive: Status post hysterectomy.  No adnexal masses.  Other: No abdominopelvic ascites.  Musculoskeletal: Visualized osseous structures are within normal limits.  IMPRESSION: 3 mm nonobstructing right lower pole renal calculus. No ureteral or bladder calculi. No hydronephrosis.  Status post subtotal colectomy with ileorectal anastomosis. Additional postsurgical changes as above.  11 mm short axis ileocolic node in the right mid abdomen. In the absence of known malignancy, this is likely reactive given adjacent postsurgical changes. In the setting of malignancy, consider follow-up in 3-6 months.   Electronically Signed By: Julian Hy M.D. On: 01/01/2020 21:17   Assessment & Plan:    1. Kidney stones -We discussed the management of kidney stones. These options include observation, ureteroscopy, shockwave lithotripsy (ESWL) and percutaneous nephrolithotomy (PCNL). We discussed which options are relevant to the patient's stone(s). We discussed the natural history of kidney stones as well as the complications of untreated stones and the impact on quality of life without treatment as well as with each of the above listed treatments. We also discussed the efficacy of each treatment in its  ability to clear the stone burden. With any of these management options I discussed the signs and symptoms of infection and the need for emergent treatment should these be experienced. For each option we discussed the ability of each procedure to clear the patient of their  stone burden.   For observation I described the risks which include but are not limited to silent renal damage, life-threatening infection, need for emergent surgery, failure to pass stone and pain.   For ureteroscopy I described the risks which include bleeding, infection, damage to contiguous structures, positioning injury, ureteral stricture, ureteral avulsion, ureteral injury, need for prolonged ureteral stent, inability to perform ureteroscopy, need for an interval procedure, inability to clear stone burden, stent discomfort/pain, heart attack, stroke, pulmonary embolus and the inherent risks with general anesthesia.   For shockwave lithotripsy I described the risks which include arrhythmia, kidney contusion, kidney hemorrhage, need for transfusion, pain, inability to adequately break up stone, inability to pass stone fragments, Steinstrasse, infection associated with obstructing stones, need for alternate surgical procedure, need for repeat shockwave lithotripsy, MI, CVA, PE and the inherent risks with anesthesia/conscious sedation.   For PCNL I described the risks including positioning injury, pneumothorax, hydrothorax, need for chest tube, inability to clear stone burden, renal laceration, arterial venous fistula or malformation, need for embolization of kidney, loss of kidney or renal function, need for repeat procedure, need for prolonged nephrostomy tube, ureteral avulsion, MI, CVA, PE and the inherent risks of general anesthesia.   - The patient would like to proceed with right percutaneous nephrolithotomy.  - Urinalysis, Routine w reflex microscopic   No follow-ups on file.  Nicolette Bang, MD  [facility]  [place]

## 2021-06-28 NOTE — Patient Instructions (Signed)
Percutaneous Nephrolithotomy Percutaneous nephrolithotomy is a procedure to remove kidney stones. Kidney stones are deposits that form inside your kidneys and can cause pain. You may need this procedure if: You have large kidney stones. Kidney stones that are bigger than 2 cm (0.78 in.) wide may require this procedure. Your kidney stones are oddly shaped. Other treatments have not been successful in helping the kidney stones to pass. You have developed an infection due to the kidney stones. Tell a health care provider about: Any allergies you have. All medicines you are taking, including vitamins, herbs, eye drops, creams, and over-the-counter medicines. Any problems you or family members have had with anesthetic medicines. Any blood disorders you have. Any surgeries you have had. Any medical conditions you have. Whether you are pregnant or may be pregnant. Whether you use any tobacco products, including cigarettes, chewing tobacco, or e-cigarettes. What are the risks? Generally, this is a safe procedure. However, problems may occur, including: Infection. Bleeding. This may include blood in your urine. Allergic reactions to medicines. Damage to other structures or organs. Kidney damage. Holes in the kidney. These often heal on their own. Numbness or tingling in the affected area. Inability to remove all the stones. You may need a different procedure to complete treatment. What happens before the procedure? Staying hydrated Follow instructions from your health care provider about hydration, which may include: Up to 2 hours before the procedure - you may continue to drink clear liquids, such as water, clear fruit juice, black coffee, and plain tea.  Eating and drinking restrictions Follow instructions from your health care provider about eating and drinking, which may include: 8 hours before the procedure - stop eating heavy meals or foods, such as meat, fried foods, or fatty foods. 6  hours before the procedure - stop eating light meals or foods, such as toast or cereal. 6 hours before the procedure - stop drinking milk or drinks that contain milk. 2 hours before the procedure - stop drinking clear liquids. Medicines Ask your health care provider about: Changing or stopping your regular medicines. This is especially important if you are taking diabetes medicines or blood thinners. Taking medicines such as aspirin and ibuprofen. These medicines can thin your blood. Do not take these medicines unless your health care provider tells you to take them. Taking over-the-counter medicines, vitamins, herbs, and supplements. Tests You may have tests, including: Blood tests. Urine tests. Tests to check how your heart is working. Imaging studies. These are used to identify: The size and number (stone burden) of the kidney stones. The position of the kidney stones. General instructions Plan to have someone take you home from the hospital or clinic. Plan to have a responsible adult care for you for at least 24 hours after you leave the hospital or clinic. This is important. Ask your health care provider how your surgical site will be marked or identified. Ask your health care provider what steps will be taken to help prevent infection. These may include: Removing hair at the surgery site. Washing skin with a germ-killing soap. Taking antibiotic medicine. What happens during the procedure?  An IV will be inserted into one of your veins. The site of the procedure will be marked. You will be given one or more of the following: A medicine to help you relax (sedative). A medicine to numb the area (local anesthetic). A medicine to make you fall asleep (general anesthetic). A medicine that is injected into your spine to numb the area below  and slightly above the injection site (spinal anesthetic). A medicine that is injected into an area of your body to numb everything below the  injection site (regional anesthetic). A thin tube (urinary catheter) will be put in your bladder to drain urine during and after the procedure. Your surgeon will make a small cut (incision) in your lower back. A tube will be inserted through the incision into your kidney. Each kidney stone will be removed through this tube. Larger stones may need to be broken up with a high-intensity light beam (laser) or other tools. After all of the stones have been removed, your health care provider may put in tubes to drain your bladder. Based on your condition: An internal tube, called a stent, may be put in your ureter. This will help drain urine from your kidney to your bladder. A surgical drain (nephrostomy tube) may be put in your kidney. The tube comes out through the incision in your lower back. This will help to drain urine or any fluid that builds up while your kidney heals. Part of the incision may be closed with stitches (sutures). A bandage (dressing) will be placed over the incision area. The procedure may vary among health care providers and hospitals. What happens after the procedure? Your blood pressure, heart rate, breathing rate, and blood oxygen level will be monitored until you leave the hospital or clinic. You may be given medicine for pain. You will be shown how to do breathing exercises, such as coughing and breathing deeply. These will help to prevent pneumonia. You will be encouraged to walk. Walking helps to prevent blood clots. Your stent and urinary catheter will be removed after 1-2 days if there is only a small amount of blood in your urine. You will be taught how to care for the catheter or nephrostomy tube, if you have them. Do not drive for 24 hours if you were given a sedative during your procedure. Summary Percutaneous nephrolithotomy is a procedure to remove kidney stones. Ask your health care provider about changing or stopping your regular medicines. Before surgery,  follow instructions from your health care provider about eating and drinking. Plan to have someone take you home from the hospital or clinic. This information is not intended to replace advice given to you by your health care provider. Make sure you discuss any questions you have with your health care provider. Document Revised: 11/15/2018 Document Reviewed: 04/11/2018 Elsevier Patient Education  [place].

## 2021-06-28 NOTE — Progress Notes (Signed)
Urological Symptom Review  Patient is experiencing the following symptoms: Burning/pain with urination Get up at night to urinate Leakage of urine Stream starts and stops Trouble starting stream Have to strain to urinate Blood in urine   Review of Systems  Gastrointestinal (upper)  : Nausea Vomiting Indigestion/heartburn  Gastrointestinal (lower) : Diarrhea  Constitutional : Fever Night Sweats Fatigue  Skin: Negative for skin symptoms  Eyes: Negative for eye symptoms  Ear/Nose/Throat : Negative for Ear/Nose/Throat symptoms  Hematologic/Lymphatic: Easy bruising  Cardiovascular : Negative for cardiovascular symptoms  Respiratory : Negative for respiratory symptoms  Endocrine: Negative for endocrine symptoms  Musculoskeletal: Back pain Joint pain  Neurological: Headaches Dizziness  Psychologic: Depression Anxiety

## 2021-07-02 ENCOUNTER — Other Ambulatory Visit (HOSPITAL_COMMUNITY): Payer: Self-pay | Admitting: Physician Assistant

## 2021-07-05 ENCOUNTER — Telehealth: Payer: Self-pay

## 2021-07-05 ENCOUNTER — Ambulatory Visit (HOSPITAL_COMMUNITY)
Admission: RE | Admit: 2021-07-05 | Discharge: 2021-07-05 | Disposition: A | Discharge status: Home / Self Care | Payer: Medicare HMO | Source: Ambulatory Visit | Attending: Urology | Admitting: Urology

## 2021-07-05 ENCOUNTER — Other Ambulatory Visit: Payer: Self-pay

## 2021-07-05 DIAGNOSIS — Z79899 Other long term (current) drug therapy: Secondary | ICD-10-CM | POA: Diagnosis not present

## 2021-07-05 DIAGNOSIS — F419 Anxiety disorder, unspecified: Secondary | ICD-10-CM | POA: Insufficient documentation

## 2021-07-05 DIAGNOSIS — F32A Depression, unspecified: Secondary | ICD-10-CM | POA: Diagnosis not present

## 2021-07-05 DIAGNOSIS — E2740 Unspecified adrenocortical insufficiency: Secondary | ICD-10-CM | POA: Diagnosis not present

## 2021-07-05 DIAGNOSIS — Z886 Allergy status to analgesic agent status: Secondary | ICD-10-CM | POA: Diagnosis not present

## 2021-07-05 DIAGNOSIS — Z881 Allergy status to other antibiotic agents status: Secondary | ICD-10-CM | POA: Insufficient documentation

## 2021-07-05 DIAGNOSIS — Z880 Allergy status to penicillin: Secondary | ICD-10-CM | POA: Insufficient documentation

## 2021-07-05 DIAGNOSIS — N200 Calculus of kidney: Secondary | ICD-10-CM | POA: Insufficient documentation

## 2021-07-05 HISTORY — PX: IR NEPHROSTOMY PLACEMENT RIGHT: IMG6064

## 2021-07-05 LAB — CBC
HCT: 39.7 % (ref 36.0–46.0)
Hemoglobin: 12.8 g/dL (ref 12.0–15.0)
MCH: 30.4 pg (ref 26.0–34.0)
MCHC: 32.2 g/dL (ref 30.0–36.0)
MCV: 94.3 fL (ref 80.0–100.0)
Platelets: 167 10*3/uL (ref 150–400)
RBC: 4.21 MIL/uL (ref 3.87–5.11)
RDW: 12.4 % (ref 11.5–15.5)
WBC: 6 10*3/uL (ref 4.0–10.5)
nRBC: 0 % (ref 0.0–0.2)

## 2021-07-05 LAB — PROTIME-INR
INR: 0.9 (ref 0.8–1.2)
Prothrombin Time: 12.5 seconds (ref 11.4–15.2)

## 2021-07-05 MED ORDER — IOHEXOL 350 MG/ML SOLN
100.0000 mL | Freq: Once | INTRAVENOUS | Status: AC | PRN
  Administered 2021-07-05: 20 mL

## 2021-07-05 MED ORDER — VANCOMYCIN HCL IN DEXTROSE 1-5 GM/200ML-% IV SOLN: 1000.0000 mg | Freq: Once | INTRAVENOUS | Status: AC

## 2021-07-05 MED ORDER — OXYCODONE HCL 5 MG PO TABS
10.0000 mg | ORAL_TABLET | ORAL | Status: DC | PRN
  Administered 2021-07-05: 10 mg via ORAL
  Filled 2021-07-05 (×3): qty 2

## 2021-07-05 MED ORDER — LORAZEPAM 2 MG/ML IJ SOLN: INTRAMUSCULAR | Status: DC | PRN

## 2021-07-05 MED ORDER — FENTANYL CITRATE (PF) 100 MCG/2ML IJ SOLN
INTRAMUSCULAR | Status: DC | PRN
  Administered 2021-07-05: 50 ug via INTRAVENOUS
  Administered 2021-07-05 (×2): 25 ug via INTRAVENOUS
  Administered 2021-07-05: 50 ug via INTRAVENOUS
  Administered 2021-07-05 (×2): 25 ug via INTRAVENOUS

## 2021-07-05 MED ORDER — OXYCODONE-ACETAMINOPHEN 5-325 MG PO TABS: 1.0000 | ORAL_TABLET | ORAL | 0 refills | Status: DC | PRN

## 2021-07-05 MED ORDER — MIDAZOLAM HCL 2 MG/2ML IJ SOLN
INTRAMUSCULAR | Status: DC | PRN
  Administered 2021-07-05: .5 mg via INTRAVENOUS
  Administered 2021-07-05: 1 mg via INTRAVENOUS
  Administered 2021-07-05: .5 mg via INTRAVENOUS
  Administered 2021-07-05: 1 mg via INTRAVENOUS
  Administered 2021-07-05 (×2): .5 mg via INTRAVENOUS

## 2021-07-05 MED ORDER — LIDOCAINE HCL (PF) 1 % IJ SOLN
INTRAMUSCULAR | Status: DC | PRN
  Administered 2021-07-05: 10 mL

## 2021-07-05 MED ORDER — SODIUM CHLORIDE 0.9 % IV SOLN: INTRAVENOUS | Status: DC

## 2021-07-05 MED ORDER — ACETAMINOPHEN 500 MG PO TABS
1000.0000 mg | ORAL_TABLET | Freq: Once | ORAL | Status: AC
  Administered 2021-07-05: 1000 mg via ORAL
  Filled 2021-07-05 (×2): qty 2

## 2021-07-05 MED ORDER — VANCOMYCIN HCL IN DEXTROSE 1-5 GM/200ML-% IV SOLN
INTRAVENOUS | Status: AC
  Administered 2021-07-05: 1000 mg via INTRAVENOUS
  Filled 2021-07-05: qty 200

## 2021-07-05 MED ORDER — OXYCODONE HCL 5 MG PO TABS
5.0000 mg | ORAL_TABLET | ORAL | Status: DC | PRN
  Filled 2021-07-05: qty 1

## 2021-07-05 MED ORDER — LIDOCAINE HCL 1 % IJ SOLN
INTRAMUSCULAR | Status: AC
  Filled 2021-07-05: qty 20

## 2021-07-05 MED ORDER — MIDAZOLAM HCL 2 MG/2ML IJ SOLN
INTRAMUSCULAR | Status: AC
  Filled 2021-07-05: qty 4

## 2021-07-05 MED ORDER — FENTANYL CITRATE (PF) 100 MCG/2ML IJ SOLN
INTRAMUSCULAR | Status: AC
  Filled 2021-07-05: qty 4

## 2021-07-05 MED ORDER — SODIUM CHLORIDE 0.9% FLUSH: 5.0000 mL | Freq: Three times a day (TID) | INTRAVENOUS | Status: DC

## 2021-07-05 NOTE — Patient Instructions (Signed)
Denise Avila  07/05/2021     @PREFPERIOPPHARMACY @   Your procedure is scheduled on  07/08/2021.   Report to [facility] at  0730  A.M.   Call this number if you have problems the morning of surgery:  [phone]   Remember:  Do not eat or drink after midnight.      Take these medicines the morning of surgery with A SIP OF WATER       baclofen, celexa, clonazepam, cymbalta, gabapentin, amerge, prilosec, oxycodone (if needed), maxalt(if needed), zanaflex (if needed), ubrelvy(if needed).     Do not wear jewelry, make-up or nail polish.  Do not wear lotions, powders, or perfumes, or deodorant.  Do not shave 48 hours prior to surgery.  Men may shave face and neck.  Do not bring valuables to the hospital.  [facility] is not responsible for any belongings or valuables.  Contacts, dentures or bridgework may not be worn into surgery.  Leave your suitcase in the car.  After surgery it may be brought to your room.  For patients admitted to the hospital, discharge time will be determined by your treatment team.  Patients discharged the day of surgery will not be allowed to drive home and must have someone with them for 24 hours.    Special instructions:   DO NOT smoke tobacco or vape for 24 hours before your procedure.  Please read over the following fact sheets that you were given. Pain Booklet, Coughing and Deep Breathing, Surgical Site Infection Prevention, Anesthesia Post-op Instructions, and Care and Recovery After Surgery      Percutaneous Nephrolithotomy, Care After This sheet gives you information about how to care for yourself after your procedure. Your health care provider may also give you more specific instructions. If you have problems or questions, contact your health care provider. What can I expect after the procedure? After the procedure, it is common to have: Soreness or pain. A small amount of blood or clear fluid coming from your incision for  a few days. Fatigue. Some blood in your urine. This will last for a few days. Follow these instructions at home: Medicines Take over-the-counter and prescription medicines only as told by your health care provider. If you were prescribed an antibiotic medicine, take it as told by your health care provider. Do not stop using the antibiotic even if you start to feel better. Ask your health care provider if the medicine prescribed to you: Requires you to avoid driving or using heavy machinery. Can cause constipation. You may need to take these actions to prevent or treat constipation: Drink enough fluid to keep your urine pale yellow. Take over-the-counter or prescription medicines. Eat foods that are high in fiber, such as beans, whole grains, and fresh fruits and vegetables. Limit foods that are high in fat and processed sugars, such as fried or sweet foods. Incision care  Follow instructions from your health care provider about how to take care of your incision. Make sure you: Wash your hands with soap and water before and after you change your bandage (dressing). If soap and water are not available, use hand sanitizer. Change your dressing as told by your health care provider. Leave stitches (sutures), skin glue, or adhesive strips in place. These skin closures may need to stay in place for 2 weeks or longer. If adhesive strip edges start to loosen and curl up, you may trim the loose edges. Do not remove adhesive  strips completely unless your health care provider tells you to do that. Check your incision area every day for signs of infection. Check for: Redness, swelling, or pain. More fluid or blood. Warmth. Pus or a bad smell. Do not take baths, swim, or use a hot tub until your health care provider approves. Ask your health care provider if you may take showers. You may only be allowed to take sponge baths. Activity Avoid strenuous activities for as long as told by your health care  provider. Return to your normal activities as told by your health care provider. Ask your health care provider what activities are safe for you. General instructions If you were sent home with a catheter or surgical drain (nephrostomy tube), follow your health care provider's instructions on how to take care of it. Wear compression stockings as told by your health care provider. These stockings help to prevent blood clots and reduce swelling in your legs. Do not use any products that contain nicotine or tobacco, such as cigarettes, e-cigarettes, and chewing tobacco. These can delay incision healing after surgery. If you need help quitting, ask your health care provider. Keep all follow-up visits as told by your health care provider. This is important. If you still have a stent, your health care provider will need to remove it after 1-2 weeks. Contact a health care provider if: You have a fever. You have redness, swelling, or pain around your incision. You have more fluid or blood coming from your incision. Your incision feels warm to the touch. You have pus or a bad smell coming from your incision. You lose your appetite. You feel nauseous or you vomit. You have been sent home with a urinary catheter or a surgical drain and urine flow suddenly stops, followed by kidney pain. Get help right away if: You notice a large amount of blood in your urine. You have blood clots in your urine. You cannot urinate. You have chest pain or trouble breathing. Summary After the procedure, it is common to feel soreness and discomfort. You may also see blood in your urine. You will be told how to care for yourself after the procedure. Follow instructions on how to care for your incision and how to recognize signs of infection. Avoid activities that require great effort. Return to activities as told by your health care provider. Get help right away if you have blood clots in your urine, you cannot urinate, or  you have trouble breathing. This information is not intended to replace advice given to you by your health care provider. Make sure you discuss any questions you have with your health care provider. Document Revised: 04/11/2018 Document Reviewed: 04/11/2018 Elsevier Patient Education  [place] Anesthesia, Adult, Care After This sheet gives you information about how to care for yourself after your procedure. Your health care provider may also give you more specific instructions. If you have problems or questions, contact your health care provider. What can I expect after the procedure? After the procedure, the following side effects are common: Pain or discomfort at the IV site. Nausea. Vomiting. Sore throat. Trouble concentrating. Feeling cold or chills. Feeling weak or tired. Sleepiness and fatigue. Soreness and body aches. These side effects can affect parts of the body that were not involved in surgery. Follow these instructions at home: For the time period you were told by your health care provider:  Rest. Do not participate in activities where you could fall or become injured. Do not drive  or use machinery. Do not drink alcohol. Do not take sleeping pills or medicines that cause drowsiness. Do not make important decisions or sign legal documents. Do not take care of children on your own. Eating and drinking Follow any instructions from your health care provider about eating or drinking restrictions. When you feel hungry, start by eating small amounts of foods that are soft and easy to digest (bland), such as toast. Gradually return to your regular diet. Drink enough fluid to keep your urine pale yellow. If you vomit, rehydrate by drinking water, juice, or clear broth. General instructions If you have sleep apnea, surgery and certain medicines can increase your risk for breathing problems. Follow instructions from your health care provider about wearing your  sleep device: Anytime you are sleeping, including during daytime naps. While taking prescription pain medicines, sleeping medicines, or medicines that make you drowsy. Have a responsible adult stay with you for the time you are told. It is important to have someone help care for you until you are awake and alert. Return to your normal activities as told by your health care provider. Ask your health care provider what activities are safe for you. Take over-the-counter and prescription medicines only as told by your health care provider. If you smoke, do not smoke without supervision. Keep all follow-up visits as told by your health care provider. This is important. Contact a health care provider if: You have nausea or vomiting that does not get better with medicine. You cannot eat or drink without vomiting. You have pain that does not get better with medicine. You are unable to pass urine. You develop a skin rash. You have a fever. You have redness around your IV site that gets worse. Get help right away if: You have difficulty breathing. You have chest pain. You have blood in your urine or stool, or you vomit blood. Summary After the procedure, it is common to have a sore throat or nausea. It is also common to feel tired. Have a responsible adult stay with you for the time you are told. It is important to have someone help care for you until you are awake and alert. When you feel hungry, start by eating small amounts of foods that are soft and easy to digest (bland), such as toast. Gradually return to your regular diet. Drink enough fluid to keep your urine pale yellow. Return to your normal activities as told by your health care provider. Ask your health care provider what activities are safe for you. This information is not intended to replace advice given to you by your health care provider. Make sure you discuss any questions you have with your health care provider. Document Revised:  06/04/2020 Document Reviewed: 01/02/2020 Elsevier Patient Education  2022 [place] [employer]. How to Use Chlorhexidine for Bathing Chlorhexidine gluconate (CHG) is a germ-killing (antiseptic) solution that is used to clean the skin. It can get rid of the bacteria that normally live on the skin and can keep them away for about 24 hours. To clean your skin with CHG, you may be given: A CHG solution to use in the shower or as part of a sponge bath. A prepackaged cloth that contains CHG. Cleaning your skin with CHG may help lower the risk for infection: While you are staying in the intensive care unit of the hospital. If you have a vascular access, such as a central line, to provide short-term or long-term access to your veins. If you have a catheter to  drain urine from your bladder. If you are on a ventilator. A ventilator is a machine that helps you breathe by moving air in and out of your lungs. After surgery. What are the risks? Risks of using CHG include: A skin reaction. Hearing loss, if CHG gets in your ears and you have a perforated eardrum. Eye injury, if CHG gets in your eyes and is not rinsed out. The CHG product catching fire. Make sure that you avoid smoking and flames after applying CHG to your skin. Do not use CHG: If you have a chlorhexidine allergy or have previously reacted to chlorhexidine. On babies younger than 33 months of age. How to use CHG solution Use CHG only as told by your health care provider, and follow the instructions on the label. Use the full amount of CHG as directed. Usually, this is one bottle. During a shower Follow these steps when using CHG solution during a shower (unless your health care provider gives you different instructions): Start the shower. Use your normal soap and shampoo to wash your face and hair. Turn off the shower or move out of the shower stream. Pour the CHG onto a clean washcloth. Do not use any type of brush or rough-edged  sponge. Starting at your neck, lather your body down to your toes. Make sure you follow these instructions: If you will be having surgery, pay special attention to the part of your body where you will be having surgery. Scrub this area for at least 1 minute. Do not use CHG on your head or face. If the solution gets into your ears or eyes, rinse them well with water. Avoid your genital area. Avoid any areas of skin that have broken skin, cuts, or scrapes. Scrub your back and under your arms. Make sure to wash skin folds. Let the lather sit on your skin for 1-2 minutes or as long as told by your health care provider. Thoroughly rinse your entire body in the shower. Make sure that all body creases and crevices are rinsed well. Dry off with a clean towel. Do not put any substances on your body afterward--such as powder, lotion, or perfume--unless you are told to do so by your health care provider. Only use lotions that are recommended by the manufacturer. Put on clean clothes or pajamas. If it is the night before your surgery, sleep in clean sheets.  During a sponge bath Follow these steps when using CHG solution during a sponge bath (unless your health care provider gives you different instructions): Use your normal soap and shampoo to wash your face and hair. Pour the CHG onto a clean washcloth. Starting at your neck, lather your body down to your toes. Make sure you follow these instructions: If you will be having surgery, pay special attention to the part of your body where you will be having surgery. Scrub this area for at least 1 minute. Do not use CHG on your head or face. If the solution gets into your ears or eyes, rinse them well with water. Avoid your genital area. Avoid any areas of skin that have broken skin, cuts, or scrapes. Scrub your back and under your arms. Make sure to wash skin folds. Let the lather sit on your skin for 1-2 minutes or as long as told by your health care  provider. Using a different clean, wet washcloth, thoroughly rinse your entire body. Make sure that all body creases and crevices are rinsed well. Dry off with a clean towel.  Do not put any substances on your body afterward--such as powder, lotion, or perfume--unless you are told to do so by your health care provider. Only use lotions that are recommended by the manufacturer. Put on clean clothes or pajamas. If it is the night before your surgery, sleep in clean sheets. How to use CHG prepackaged cloths Only use CHG cloths as told by your health care provider, and follow the instructions on the label. Use the CHG cloth on clean, dry skin. Do not use the CHG cloth on your head or face unless your health care provider tells you to. When washing with the CHG cloth: Avoid your genital area. Avoid any areas of skin that have broken skin, cuts, or scrapes. Before surgery Follow these steps when using a CHG cloth to clean before surgery (unless your health care provider gives you different instructions): Using the CHG cloth, vigorously scrub the part of your body where you will be having surgery. Scrub using a back-and-forth motion for 3 minutes. The area on your body should be completely wet with CHG when you are done scrubbing. Do not rinse. Discard the cloth and let the area air-dry. Do not put any substances on the area afterward, such as powder, lotion, or perfume. Put on clean clothes or pajamas. If it is the night before your surgery, sleep in clean sheets.  For general bathing Follow these steps when using CHG cloths for general bathing (unless your health care provider gives you different instructions). Use a separate CHG cloth for each area of your body. Make sure you wash between any folds of skin and between your fingers and toes. Wash your body in the following order, switching to a new cloth after each step: The front of your neck, shoulders, and chest. Both of your arms, under your  arms, and your hands. Your stomach and groin area, avoiding the genitals. Your right leg and foot. Your left leg and foot. The back of your neck, your back, and your buttocks. Do not rinse. Discard the cloth and let the area air-dry. Do not put any substances on your body afterward--such as powder, lotion, or perfume--unless you are told to do so by your health care provider. Only use lotions that are recommended by the manufacturer. Put on clean clothes or pajamas. Contact a health care provider if: Your skin gets irritated after scrubbing. You have questions about using your solution or cloth. You swallow any chlorhexidine. Call your local poison control center (1-682 231 7113 in the [place] Get help right away if: Your eyes itch badly, or they become very red or swollen. Your skin itches badly and is red or swollen. Your hearing changes. You have trouble seeing. You have swelling or tingling in your mouth or throat. You have trouble breathing. These symptoms may represent a serious problem that is an emergency. Do not wait to see if the symptoms will go away. Get medical help right away. Call your local emergency services (911 in the [place] Do not drive yourself to the hospital. Summary Chlorhexidine gluconate (CHG) is a germ-killing (antiseptic) solution that is used to clean the skin. Cleaning your skin with CHG may help to lower your risk for infection. You may be given CHG to use for bathing. It may be in a bottle or in a prepackaged cloth to use on your skin. Carefully follow your health care provider's instructions and the instructions on the product label. Do not use CHG if you have a chlorhexidine allergy. Contact your health  care provider if your skin gets irritated after scrubbing. This information is not intended to replace advice given to you by your health care provider. Make sure you discuss any questions you have with your health care provider. Document Revised: 11/30/2020  Document Reviewed: 11/30/2020 Elsevier Patient Education  2022 [employer].

## 2021-07-05 NOTE — Procedures (Signed)
Interventional Radiology Procedure Note  Procedure: Right percutaneous nephrostomy placement  Findings: Please refer to procedural dictation for full description. Right posterior, mid pole access with placement of 8.5 Fr Dawson-Mueller nephrostomy tube placed to bag drainage.  Calyceal access and renal pelvis stone in close approximation sonographically.    Complications: None immediate  Estimated Blood Loss: < 5 mL  Recommendations: Keep tube to bag drainage. Follow up as per Urology.   Ruthann Cancer, MD Pager: [phone]

## 2021-07-05 NOTE — H&P (Signed)
Chief Complaint: Patient was seen in consultation today for right percutaneous nephrostomy tube   Referring Physician(s): McKenzie,Patrick L  Supervising Physician: Ruthann Cancer  Patient Status: [facility] - Out-pt  History of Present Illness: Denise Avila is a 45 y.o. female with a medical history significant for adrenal insufficiency, anxiety/depression, uterine cancer, and kidney stones. She was evaluated at [facility] 06/25/21 for severe right flank pain and was diagnosed with a 16 mm right UPJ calculous. She is followed by Dr. Alyson Ingles with [facility] and she is scheduled for a right nephrolithotomy 07/08/21.   Interventional Radiology has been asked to evaluate this patient for an image-guided right percutaneous nephrostomy tube to assist the Urology team with their treatment goals.   Past Medical History:  Diagnosis Date   Abnormal Pap smear of cervix 12/2014   Acid reflux    Adrenal insufficiency (HCC)    Anxiety    Arthritis    Arthritis    Depression    Depression    Endometriosis    Fibroids    age 76   High cholesterol    History of kidney stones    Liver disease    per pt report, will have biopsy Nov 08, 2017   Migraines    Pleurisy 02/2015   while hospitalized    Seizure ([place])    Skin cancer (melanoma) ([place])    Syncope and collapse    Uterine cancer ([place])     Past Surgical History:  Procedure Laterality Date   ABDOMINAL HYSTERECTOMY     ABLATION     uterine   COLECTOMY     CYSTOSCOPY W/ URETERAL STENT PLACEMENT Left 02/02/2015   Procedure: CYSTOSCOPY WITH RETROGRADE PYELOGRAM/URETERAL STENT PLACEMENT;  Surgeon: Festus Aloe, MD;  Location: [facility];  Service: Urology;  Laterality: Left;   CYSTOSCOPY WITH URETEROSCOPY AND STENT PLACEMENT Left 02/13/2015   Procedure: LEFT URETEROSCOPY WITH HOLMIUM LASER AND STENT PLACEMENT;  Surgeon: Festus Aloe, MD;  Location: [facility];  Service: Urology;  Laterality: Left;   DILATION AND CURETTAGE OF UTERUS      ESOPHAGOGASTRODUODENOSCOPY ENDOSCOPY  10/11/2017   @ [facility]; BIOPSY OF LOWER INTESTINE   HOLMIUM LASER APPLICATION N/A 1/61/0960   Procedure: HOLMIUM LASER APPLICATION;  Surgeon: Festus Aloe, MD;  Location: [facility];  Service: Urology;  Laterality: N/A;   KNEE SURGERY  2019   LAPAROSCOPIC GASTRIC SLEEVE RESECTION     LAPAROSCOPY     x 4   MELANOMA EXCISION  2000   melanoma removed from face, basal cell removed from nose   REPAIR VAGINAL CUFF N/A 08/21/2015   Procedure: REPAIR VAGINAL CUFF, EXAM UNDER ANESTHESIA;  Surgeon: Everitt Amber, MD;  Location: [facility];  Service: Gynecology;  Laterality: N/A;   ROBOTIC ASSISTED TOTAL HYSTERECTOMY WITH BILATERAL SALPINGO OOPHERECTOMY Bilateral 05/21/2015   Procedure: XI ROBOTIC ASSISTED TOTAL HYSTERECTOMY WITH BILATERAL SALPINGO OOPHORECTOMY;  Surgeon: Everitt Amber, MD;  Location: [facility];  Service: Gynecology;  Laterality: Bilateral;    Allergies: Penicillins, Nortriptyline, Nsaids, Amoxicillin, and Ciprofloxacin  Medications: Prior to Admission medications   Medication Sig Start Date End Date Taking? Authorizing Provider  baclofen (LIORESAL) 10 MG tablet Take 10 mg by mouth 3 (three) times daily. 02/20/18  Yes [provider]  celecoxib (CELEBREX) 200 MG capsule Take 200 mg by mouth daily. 01/10/20  Yes [provider]  citalopram (CELEXA) 10 MG tablet Take 10 mg by mouth daily.   Yes [provider]  clonazePAM (KLONOPIN) 1 MG tablet Take 1  mg by mouth daily. 04/03/17  Yes [provider]  diphenhydrAMINE (BENADRYL) 25 MG tablet Take 25 mg by mouth every 6 (six) hours as needed (take with promethazine for migraines).   Yes [provider]  DULoxetine (CYMBALTA) 30 MG capsule Take 30 mg by mouth daily. 01/16/20  Yes [provider]  DULoxetine (CYMBALTA) 60 MG capsule Take 60 mg by mouth at bedtime. 12/09/17  Yes [provider]  ezetimibe (ZETIA) 10 MG tablet Take 10 mg by mouth daily. 11/06/19  Yes  [provider]  gabapentin (NEURONTIN) 800 MG tablet Take 1,600 tablets by mouth 2 (two) times daily. 12/24/17  Yes [provider]  lamoTRIgine (LAMICTAL) 200 MG tablet Take 200 mg by mouth every evening.   Yes [provider]  omeprazole (PRILOSEC) 40 MG capsule Take 40 mg by mouth 2 (two) times daily. 04/11/21  Yes [provider]  OVER THE COUNTER MEDICATION Take 1 tablet by mouth daily. Bariatric Pal otc vitamin   Yes [provider]  oxyCODONE-acetaminophen (PERCOCET) 5-325 MG tablet Take 1 tablet by mouth every 4 (four) hours as needed. 06/28/21  Yes McKenzie, Candee Furbish, MD  promethazine (PHENERGAN) 25 MG tablet Take 25 mg by mouth 4 (four) times daily as needed for nausea or vomiting.   Yes [provider]  QUEtiapine (SEROQUEL) 25 MG tablet Take 50 mg by mouth at bedtime. 11/05/19  Yes [provider]  rizatriptan (MAXALT) 10 MG tablet Take 10 mg by mouth as needed for migraine. 09/03/19  Yes [provider]  rosuvastatin (CRESTOR) 5 MG tablet Take 5 mg by mouth daily.   Yes [provider]  sucralfate (CARAFATE) 1 G tablet Take 1 tablet (1 g total) by mouth 4 (four) times daily. 02/24/15  Yes Lacretia Leigh, MD  tiZANidine (ZANAFLEX) 2 MG tablet Take 2 mg by mouth 3 (three) times daily. 05/06/21  Yes [provider]  TOVIAZ 4 MG TB24 tablet TAKE 1 TABLET(4 MG) BY MOUTH DAILY 03/18/20  Yes McKenzie, Candee Furbish, MD  Ubrogepant (UBRELVY) 100 MG TABS Take 100 mg by mouth daily as needed (migraines).   Yes [provider]  methenamine (MANDELAMINE) 1 g tablet Take 1 tablet (1,000 mg total) by mouth 2 (two) times daily. 01/14/20   Franchot Gallo, MD  naratriptan (AMERGE) 2.5 MG tablet Take 2.5 mg by mouth as needed for migraine. Take one (1) tablet at onset of headache; if returns or does not resolve, may repeat after 4 hours; do not exceed five (5) mg in 24 hours.    [provider]  Simethicone  (GAS-X PO) Take 1-2 tablets by mouth 3 (three) times daily as needed (gas).    [provider]     Family History  Problem Relation Age of Onset   Hypertension Mother    Melanoma Mother    Breast cancer Mother 21   Cancer - Other Father        liver   Diabetes Mellitus II Maternal Grandmother    Heart disease Maternal Grandmother    Breast cancer Maternal Grandmother    Cancer - Lung Maternal Grandfather    Osteoporosis Paternal Grandmother    Melanoma Paternal Grandfather     Social History   Socioeconomic History   Marital status: Married    Spouse name: Not on file   Number of children: 1   Years of education: Not on file   Highest education level: Associate degree: occupational, Hotel manager, or vocational program  Occupational History   Not on file  Tobacco Use   Smoking status: Never   Smokeless tobacco: Never  Vaping Use   Vaping Use: Never used  Substance and Sexual Activity   Alcohol use: No   Drug use: No   Sexual activity: Yes  Other Topics Concern   Not on file  Social History Narrative   Lives at home with family. Currently living with mother to help take care of her    Right handed   No Caffeine   Social Determinants of Health   Financial Resource Strain: Not on file  Food Insecurity: Not on file  Transportation Needs: Not on file  Physical Activity: Not on file  Stress: Not on file  Social Connections: Not on file    Review of Systems: A 12 point ROS discussed and pertinent positives are indicated in the HPI above.  All other systems are negative.  Review of Systems  Constitutional:  Positive for fatigue. Negative for appetite change.  Respiratory:  Negative for cough and shortness of breath.   Cardiovascular:  Negative for chest pain and leg swelling.  Gastrointestinal:  Positive for diarrhea and nausea. Negative for abdominal pain and vomiting.  Genitourinary:  Positive for flank pain and hematuria.  Neurological:  Negative for  dizziness and headaches.   Vital Signs: BP 112/75   Pulse 82   Temp 98.4 F (36.9 C) (Oral)   Resp 12   Ht 6' (1.829 m)   Wt 217 lb (98.4 kg)   LMP 05/04/2015 (Approximate)   SpO2 97%   BMI 29.43 kg/m   Physical Exam Constitutional:      General: She is not in acute distress.    Appearance: Normal appearance. She is not ill-appearing.  HENT:     Mouth/Throat:     Mouth: Mucous membranes are moist.     Pharynx: Oropharynx is clear.  Cardiovascular:     Rate and Rhythm: Normal rate and regular rhythm.     Pulses: Normal pulses.     Heart sounds: Normal heart sounds.  Pulmonary:     Effort: Pulmonary effort is normal.     Breath sounds: Normal breath sounds.  Abdominal:     General: Bowel sounds are normal.     Palpations: Abdomen is soft.  Musculoskeletal:        General: Normal range of motion.     Right lower leg: No edema.     Left lower leg: No edema.  Skin:    General: Skin is warm and dry.  Neurological:     Mental Status: She is alert and oriented to person, place, and time.    Imaging: No results found.  Labs:  CBC: No results for input(s): WBC, HGB, HCT, PLT in the last 8760 hours.  COAGS: Recent Labs    07/05/21 0819  INR 0.9    BMP: No results for input(s): NA, K, CL, CO2, GLUCOSE, BUN, CALCIUM, CREATININE, GFRNONAA, GFRAA in the last 8760 hours.  Invalid input(s): CMP  LIVER FUNCTION TESTS: No results for input(s): BILITOT, AST, ALT, ALKPHOS, PROT, ALBUMIN in the last 8760 hours.  TUMOR MARKERS: No results for input(s): AFPTM, CEA, CA199, CHROMGRNA in the last 8760 hours.  Assessment and Plan:  Right renal calculus; pending nephrolithotomy 07/08/21: Denise Avila, 45 year old female, presents today to the [facility] department for an image-guided right percutaneous nephrostomy tube placement.  Risks and benefits of right PCN placement was discussed with the patient including, but not  limited to, infection,  bleeding, significant bleeding causing loss or decrease in renal function or damage to adjacent structures.   All of the patient's questions were answered, patient is agreeable to proceed. She has been NPO. Labs are pending but will be reviewed prior to the start of the procedure.   Consent signed and in chart.   Thank you for this interesting consult.  I greatly enjoyed meeting Denise Avila and look forward to participating in their care.  A copy of this report was sent to the requesting provider on this date.  Electronically Signed: Soyla Dryer, AGACNP-BC [phone] 07/05/2021, 9:31 AM   I spent a total of  30 Minutes   in face to face in clinical consultation, greater than 50% of which was counseling/coordinating care for right percutaneous nephrostomy tube.

## 2021-07-05 NOTE — Telephone Encounter (Signed)
Patient called office this am requesting refill on pain medication prior to surgery this Thursday, October 6th.

## 2021-07-06 ENCOUNTER — Encounter (HOSPITAL_COMMUNITY)
Admission: RE | Admit: 2021-07-06 | Discharge: 2021-07-06 | Disposition: A | Discharge status: Home / Self Care | Payer: Medicare HMO | Source: Ambulatory Visit | Attending: Urology | Admitting: Urology

## 2021-07-06 ENCOUNTER — Encounter (HOSPITAL_COMMUNITY): Payer: Self-pay

## 2021-07-06 ENCOUNTER — Ambulatory Visit (INDEPENDENT_AMBULATORY_CARE_PROVIDER_SITE_OTHER): Payer: Medicare HMO | Admitting: Urology

## 2021-07-06 ENCOUNTER — Other Ambulatory Visit: Payer: Self-pay

## 2021-07-06 VITALS — BP 112/75 | HR 97

## 2021-07-06 DIAGNOSIS — N200 Calculus of kidney: Secondary | ICD-10-CM | POA: Diagnosis not present

## 2021-07-06 NOTE — Pre-Procedure Instructions (Signed)
In for Pre-op appointment and states, "I had this Tube put in my back around 1000 yesterday and it started itching yesterday around 2 o'clock. It is itching so bad right now I can hardly stand it. Will you look at it?". Patient had right nephrostomy tube with drainage in. The dressing was covering the site but there was no redness noted at the tape edges. I called Estill Bamberg at Dr Noland Fordyce office and when patient leaves here, she is going to his office for him to evaluate the site.

## 2021-07-06 NOTE — Progress Notes (Signed)
Patient here today with complaints of burning itching sensation since have nephrostomy tube placed yesterday.  Dressing clean dry and intact.   Patient took 2 benadryl today with no relief.

## 2021-07-08 ENCOUNTER — Ambulatory Visit (HOSPITAL_COMMUNITY): Payer: Medicare HMO | Admitting: Certified Registered"

## 2021-07-08 ENCOUNTER — Ambulatory Visit (HOSPITAL_COMMUNITY): Payer: Medicare HMO

## 2021-07-08 ENCOUNTER — Encounter (HOSPITAL_COMMUNITY): Payer: Self-pay | Admitting: Urology

## 2021-07-08 ENCOUNTER — Other Ambulatory Visit: Payer: Self-pay

## 2021-07-08 ENCOUNTER — Encounter (HOSPITAL_COMMUNITY)
Admission: RE | Disposition: A | Discharge status: Home / Self Care | Payer: Self-pay | Source: Home / Self Care | Attending: Urology

## 2021-07-08 ENCOUNTER — Observation Stay (HOSPITAL_COMMUNITY)
Admission: RE | Admit: 2021-07-08 | Discharge: 2021-07-10 | Disposition: A | Discharge status: Home / Self Care | Payer: Medicare HMO | Attending: Urology | Admitting: Urology

## 2021-07-08 DIAGNOSIS — Z8542 Personal history of malignant neoplasm of other parts of uterus: Secondary | ICD-10-CM | POA: Diagnosis not present

## 2021-07-08 DIAGNOSIS — N200 Calculus of kidney: Secondary | ICD-10-CM | POA: Diagnosis not present

## 2021-07-08 DIAGNOSIS — Z85828 Personal history of other malignant neoplasm of skin: Secondary | ICD-10-CM | POA: Insufficient documentation

## 2021-07-08 DIAGNOSIS — F418 Other specified anxiety disorders: Secondary | ICD-10-CM | POA: Diagnosis not present

## 2021-07-08 HISTORY — PX: NEPHROLITHOTOMY: SHX5134

## 2021-07-08 LAB — BASIC METABOLIC PANEL
Anion gap: 9 (ref 5–15)
BUN: 19 mg/dL (ref 6–20)
CO2: 26 mmol/L (ref 22–32)
Calcium: 8.9 mg/dL (ref 8.9–10.3)
Chloride: 105 mmol/L (ref 98–111)
Creatinine, Ser: 0.68 mg/dL (ref 0.44–1.00)
GFR, Estimated: >60 mL/min (ref >60)
Glucose, Bld: 108 mg/dL — ABNORMAL HIGH (ref 70–99)
Potassium: 4.4 mmol/L (ref 3.5–5.1)
Sodium: 140 mmol/L (ref 135–145)

## 2021-07-08 LAB — CBC
HCT: 42.3 % (ref 36.0–46.0)
Hemoglobin: 13.7 g/dL (ref 12.0–15.0)
MCH: 31.5 pg (ref 26.0–34.0)
MCHC: 32.4 g/dL (ref 30.0–36.0)
MCV: 97.2 fL (ref 80.0–100.0)
Platelets: 146 10*3/uL — ABNORMAL LOW (ref 150–400)
RBC: 4.35 MIL/uL (ref 3.87–5.11)
RDW: 12.4 % (ref 11.5–15.5)
WBC: 8.9 10*3/uL (ref 4.0–10.5)
nRBC: 0 % (ref 0.0–0.2)

## 2021-07-08 SURGERY — NEPHROLITHOTOMY PERCUTANEOUS
Anesthesia: General | Laterality: Right

## 2021-07-08 MED ORDER — SODIUM CHLORIDE FLUSH 0.9 % IV SOLN
INTRAVENOUS | Status: AC
  Filled 2021-07-08: qty 10

## 2021-07-08 MED ORDER — ONDANSETRON HCL 4 MG/2ML IJ SOLN: 4.0000 mg | INTRAMUSCULAR | Status: DC | PRN

## 2021-07-08 MED ORDER — PANTOPRAZOLE SODIUM 40 MG PO TBEC
40.0000 mg | DELAYED_RELEASE_TABLET | Freq: Every day | ORAL | Status: DC
  Administered 2021-07-08 – 2021-07-10 (×3): 40 mg via ORAL
  Filled 2021-07-08 (×3): qty 1

## 2021-07-08 MED ORDER — FESOTERODINE FUMARATE ER 4 MG PO TB24
4.0000 mg | ORAL_TABLET | Freq: Every day | ORAL | Status: DC
  Administered 2021-07-08 – 2021-07-10 (×3): 4 mg via ORAL
  Filled 2021-07-08 (×3): qty 1

## 2021-07-08 MED ORDER — PROMETHAZINE HCL 25 MG/ML IJ SOLN
12.5000 mg | Freq: Once | INTRAMUSCULAR | Status: AC | PRN
  Administered 2021-07-08: 12.5 mg via INTRAVENOUS

## 2021-07-08 MED ORDER — DULOXETINE HCL 60 MG PO CPEP
60.0000 mg | ORAL_CAPSULE | Freq: Every day | ORAL | Status: DC
  Administered 2021-07-08 – 2021-07-09 (×2): 60 mg via ORAL
  Filled 2021-07-08 (×2): qty 1

## 2021-07-08 MED ORDER — FENTANYL CITRATE (PF) 100 MCG/2ML IJ SOLN
INTRAMUSCULAR | Status: DC | PRN
  Administered 2021-07-08: 50 ug via INTRAVENOUS
  Administered 2021-07-08: 150 ug via INTRAVENOUS
  Administered 2021-07-08 (×2): 50 ug via INTRAVENOUS

## 2021-07-08 MED ORDER — FENTANYL CITRATE PF 50 MCG/ML IJ SOSY
25.0000 ug | PREFILLED_SYRINGE | INTRAMUSCULAR | Status: DC | PRN
  Administered 2021-07-08 (×4): 50 ug via INTRAVENOUS
  Filled 2021-07-08 (×4): qty 1

## 2021-07-08 MED ORDER — HYDROMORPHONE HCL 1 MG/ML IJ SOLN
INTRAMUSCULAR | Status: AC
  Filled 2021-07-08: qty 0.5

## 2021-07-08 MED ORDER — ONDANSETRON HCL 4 MG/2ML IJ SOLN
4.0000 mg | Freq: Once | INTRAMUSCULAR | Status: AC | PRN
  Administered 2021-07-08: 4 mg via INTRAVENOUS
  Filled 2021-07-08: qty 2

## 2021-07-08 MED ORDER — SUGAMMADEX SODIUM 500 MG/5ML IV SOLN
INTRAVENOUS | Status: DC | PRN
  Administered 2021-07-08: 200 mg via INTRAVENOUS

## 2021-07-08 MED ORDER — HYDROMORPHONE HCL 1 MG/ML IJ SOLN
0.5000 mg | INTRAMUSCULAR | Status: DC | PRN
  Administered 2021-07-08: 1 mg via INTRAVENOUS
  Administered 2021-07-08: 0.5 mg via INTRAVENOUS
  Administered 2021-07-09 – 2021-07-10 (×5): 1 mg via INTRAVENOUS
  Filled 2021-07-08 (×6): qty 1
  Filled 2021-07-08: qty 0.5

## 2021-07-08 MED ORDER — SODIUM CHLORIDE 0.9 % IV SOLN
INTRAVENOUS | Status: DC | PRN
  Administered 2021-07-08: 30 mL via INTRAMUSCULAR

## 2021-07-08 MED ORDER — GENTAMICIN SULFATE 40 MG/ML IJ SOLN
5.0000 mg/kg | INTRAVENOUS | Status: AC
  Administered 2021-07-08: 420 mg via INTRAVENOUS
  Filled 2021-07-08 (×2): qty 10.5

## 2021-07-08 MED ORDER — FENTANYL CITRATE (PF) 250 MCG/5ML IJ SOLN
INTRAMUSCULAR | Status: AC
  Filled 2021-07-08: qty 5

## 2021-07-08 MED ORDER — SODIUM CHLORIDE 0.9 % IR SOLN
Status: DC | PRN
  Administered 2021-07-08 (×2): 3000 mL

## 2021-07-08 MED ORDER — PROPOFOL 10 MG/ML IV BOLUS
INTRAVENOUS | Status: AC
  Filled 2021-07-08: qty 20

## 2021-07-08 MED ORDER — TIZANIDINE HCL 2 MG PO TABS
2.0000 mg | ORAL_TABLET | Freq: Three times a day (TID) | ORAL | Status: DC
  Administered 2021-07-08 – 2021-07-10 (×5): 2 mg via ORAL
  Filled 2021-07-08 (×6): qty 1

## 2021-07-08 MED ORDER — DIPHENHYDRAMINE HCL 50 MG/ML IJ SOLN
12.5000 mg | Freq: Four times a day (QID) | INTRAMUSCULAR | Status: DC | PRN
  Administered 2021-07-08: 25 mg via INTRAVENOUS
  Administered 2021-07-09: 12.5 mg via INTRAVENOUS
  Administered 2021-07-09 – 2021-07-10 (×4): 25 mg via INTRAVENOUS
  Filled 2021-07-08 (×6): qty 1

## 2021-07-08 MED ORDER — DEXAMETHASONE SODIUM PHOSPHATE 10 MG/ML IJ SOLN
INTRAMUSCULAR | Status: AC
  Filled 2021-07-08: qty 1

## 2021-07-08 MED ORDER — PROMETHAZINE HCL 25 MG/ML IJ SOLN: 12.5000 mg | Freq: Once | INTRAMUSCULAR | Status: DC | PRN

## 2021-07-08 MED ORDER — HYDROMORPHONE HCL 1 MG/ML IJ SOLN
0.2500 mg | INTRAMUSCULAR | Status: DC | PRN
  Administered 2021-07-08 (×2): 0.5 mg via INTRAVENOUS
  Filled 2021-07-08: qty 0.5

## 2021-07-08 MED ORDER — HYDROCORTISONE 1 % EX CREA
TOPICAL_CREAM | Freq: Two times a day (BID) | CUTANEOUS | Status: DC
  Administered 2021-07-10: 1 via TOPICAL
  Filled 2021-07-08: qty 28

## 2021-07-08 MED ORDER — ROSUVASTATIN CALCIUM 10 MG PO TABS
5.0000 mg | ORAL_TABLET | Freq: Every day | ORAL | Status: DC
  Administered 2021-07-08 – 2021-07-10 (×3): 5 mg via ORAL
  Filled 2021-07-08 (×4): qty 1

## 2021-07-08 MED ORDER — DEXAMETHASONE SODIUM PHOSPHATE 10 MG/ML IJ SOLN
INTRAMUSCULAR | Status: DC | PRN
  Administered 2021-07-08: 8 mg via INTRAVENOUS

## 2021-07-08 MED ORDER — PROPOFOL 10 MG/ML IV BOLUS
INTRAVENOUS | Status: DC | PRN
  Administered 2021-07-08: 200 mg via INTRAVENOUS

## 2021-07-08 MED ORDER — METHENAMINE MANDELATE 1 G PO TABS
1000.0000 mg | ORAL_TABLET | Freq: Two times a day (BID) | ORAL | Status: DC
  Administered 2021-07-09 – 2021-07-10 (×3): 1000 mg via ORAL
  Filled 2021-07-08: qty 2
  Filled 2021-07-08 (×9): qty 1

## 2021-07-08 MED ORDER — ONDANSETRON HCL 4 MG/2ML IJ SOLN
INTRAMUSCULAR | Status: AC
  Filled 2021-07-08: qty 2

## 2021-07-08 MED ORDER — CHLORHEXIDINE GLUCONATE CLOTH 2 % EX PADS
6.0000 | MEDICATED_PAD | Freq: Every day | CUTANEOUS | Status: DC
  Administered 2021-07-09 – 2021-07-10 (×2): 6 via TOPICAL

## 2021-07-08 MED ORDER — PHENYLEPHRINE HCL (PRESSORS) 10 MG/ML IV SOLN
INTRAVENOUS | Status: DC | PRN
Start: 2021-07-08 — End: 2021-07-08
  Administered 2021-07-08 (×2): 80 ug via INTRAVENOUS

## 2021-07-08 MED ORDER — ORAL CARE MOUTH RINSE: 15.0000 mL | Freq: Once | OROMUCOSAL | Status: AC

## 2021-07-08 MED ORDER — DIATRIZOATE MEGLUMINE 30 % UR SOLN
URETHRAL | Status: DC | PRN
  Administered 2021-07-08: 30 mL via URETHRAL

## 2021-07-08 MED ORDER — PHENYLEPHRINE 40 MCG/ML (10ML) SYRINGE FOR IV PUSH (FOR BLOOD PRESSURE SUPPORT)
PREFILLED_SYRINGE | INTRAVENOUS | Status: AC
  Filled 2021-07-08: qty 10

## 2021-07-08 MED ORDER — ACETAMINOPHEN 325 MG PO TABS
650.0000 mg | ORAL_TABLET | ORAL | Status: DC | PRN
  Filled 2021-07-08: qty 2

## 2021-07-08 MED ORDER — DULOXETINE HCL 30 MG PO CPEP
30.0000 mg | ORAL_CAPSULE | Freq: Every day | ORAL | Status: DC
  Administered 2021-07-09 – 2021-07-10 (×2): 30 mg via ORAL
  Filled 2021-07-08 (×4): qty 1

## 2021-07-08 MED ORDER — HYOSCYAMINE SULFATE 0.125 MG SL SUBL: 0.1250 mg | SUBLINGUAL_TABLET | SUBLINGUAL | Status: DC | PRN

## 2021-07-08 MED ORDER — LAMOTRIGINE 100 MG PO TABS
200.0000 mg | ORAL_TABLET | Freq: Every evening | ORAL | Status: DC
  Administered 2021-07-08 – 2021-07-09 (×2): 200 mg via ORAL
  Filled 2021-07-08 (×2): qty 1
  Filled 2021-07-08: qty 2
  Filled 2021-07-08 (×2): qty 1

## 2021-07-08 MED ORDER — ROCURONIUM BROMIDE 10 MG/ML (PF) SYRINGE
PREFILLED_SYRINGE | INTRAVENOUS | Status: AC
  Filled 2021-07-08: qty 10

## 2021-07-08 MED ORDER — CITALOPRAM HYDROBROMIDE 10 MG PO TABS
10.0000 mg | ORAL_TABLET | Freq: Every day | ORAL | Status: DC
  Administered 2021-07-09 – 2021-07-10 (×2): 10 mg via ORAL
  Filled 2021-07-08 (×2): qty 1

## 2021-07-08 MED ORDER — FENTANYL CITRATE (PF) 100 MCG/2ML IJ SOLN
INTRAMUSCULAR | Status: AC
  Filled 2021-07-08: qty 2

## 2021-07-08 MED ORDER — OXYCODONE-ACETAMINOPHEN 5-325 MG PO TABS
1.0000 | ORAL_TABLET | ORAL | Status: DC | PRN
Start: 2021-07-08 — End: 2021-07-10
  Administered 2021-07-08 – 2021-07-10 (×7): 2 via ORAL
  Administered 2021-07-10: 1 via ORAL
  Filled 2021-07-08 (×4): qty 2
  Filled 2021-07-08: qty 1
  Filled 2021-07-08 (×3): qty 2

## 2021-07-08 MED ORDER — LIDOCAINE HCL (CARDIAC) PF 50 MG/5ML IV SOSY
PREFILLED_SYRINGE | INTRAVENOUS | Status: DC | PRN
  Administered 2021-07-08: 100 mg via INTRAVENOUS

## 2021-07-08 MED ORDER — STERILE WATER FOR IRRIGATION IR SOLN
Status: DC | PRN
  Administered 2021-07-08: 1000 mL

## 2021-07-08 MED ORDER — LIDOCAINE HCL (PF) 2 % IJ SOLN
INTRAMUSCULAR | Status: AC
  Filled 2021-07-08: qty 5

## 2021-07-08 MED ORDER — BACLOFEN 10 MG PO TABS
10.0000 mg | ORAL_TABLET | Freq: Three times a day (TID) | ORAL | Status: DC
  Administered 2021-07-08 – 2021-07-10 (×6): 10 mg via ORAL
  Filled 2021-07-08 (×6): qty 1

## 2021-07-08 MED ORDER — PROMETHAZINE HCL 25 MG/ML IJ SOLN
INTRAMUSCULAR | Status: AC
  Filled 2021-07-08: qty 1

## 2021-07-08 MED ORDER — DOCUSATE SODIUM 100 MG PO CAPS
100.0000 mg | ORAL_CAPSULE | Freq: Two times a day (BID) | ORAL | Status: DC
  Administered 2021-07-08 – 2021-07-09 (×3): 100 mg via ORAL
  Filled 2021-07-08 (×5): qty 1

## 2021-07-08 MED ORDER — ZOLPIDEM TARTRATE 5 MG PO TABS: 5.0000 mg | ORAL_TABLET | Freq: Every evening | ORAL | Status: DC | PRN

## 2021-07-08 MED ORDER — MIDAZOLAM HCL 2 MG/2ML IJ SOLN
INTRAMUSCULAR | Status: DC | PRN
  Administered 2021-07-08: 4 mg via INTRAVENOUS

## 2021-07-08 MED ORDER — PROMETHAZINE HCL 25 MG/ML IJ SOLN: 12.5000 mg | Freq: Once | INTRAMUSCULAR | Status: DC

## 2021-07-08 MED ORDER — QUETIAPINE FUMARATE 50 MG PO TABS
50.0000 mg | ORAL_TABLET | Freq: Every day | ORAL | Status: DC
  Administered 2021-07-08 – 2021-07-09 (×2): 50 mg via ORAL
  Filled 2021-07-08 (×4): qty 1

## 2021-07-08 MED ORDER — CLONAZEPAM 0.5 MG PO TABS
1.0000 mg | ORAL_TABLET | Freq: Every day | ORAL | Status: DC
  Administered 2021-07-08 – 2021-07-10 (×3): 1 mg via ORAL
  Filled 2021-07-08 (×3): qty 2

## 2021-07-08 MED ORDER — ONDANSETRON HCL 4 MG/2ML IJ SOLN
INTRAMUSCULAR | Status: DC | PRN
  Administered 2021-07-08: 4 mg via INTRAVENOUS

## 2021-07-08 MED ORDER — DIPHENHYDRAMINE HCL 12.5 MG/5ML PO ELIX
12.5000 mg | ORAL_SOLUTION | Freq: Four times a day (QID) | ORAL | Status: DC | PRN
  Filled 2021-07-08: qty 10

## 2021-07-08 MED ORDER — ROCURONIUM BROMIDE 100 MG/10ML IV SOLN
INTRAVENOUS | Status: DC | PRN
  Administered 2021-07-08: 50 mg via INTRAVENOUS
  Administered 2021-07-08: 20 mg via INTRAVENOUS

## 2021-07-08 MED ORDER — CHLORHEXIDINE GLUCONATE 0.12 % MT SOLN
15.0000 mL | Freq: Once | OROMUCOSAL | Status: AC
  Administered 2021-07-08: 15 mL via OROMUCOSAL

## 2021-07-08 MED ORDER — LACTATED RINGERS IV SOLN
INTRAVENOUS | Status: DC
  Administered 2021-07-08: 1000 mL via INTRAVENOUS

## 2021-07-08 MED ORDER — SODIUM CHLORIDE 0.9 % IV SOLN: INTRAVENOUS | Status: DC

## 2021-07-08 MED ORDER — MIDAZOLAM HCL 2 MG/2ML IJ SOLN
INTRAMUSCULAR | Status: AC
  Filled 2021-07-08: qty 4

## 2021-07-08 MED ORDER — EZETIMIBE 10 MG PO TABS
10.0000 mg | ORAL_TABLET | Freq: Every day | ORAL | Status: DC
  Administered 2021-07-09 – 2021-07-10 (×2): 10 mg via ORAL
  Filled 2021-07-08 (×3): qty 1

## 2021-07-08 MED ORDER — GABAPENTIN 400 MG PO CAPS
800.0000 mg | ORAL_CAPSULE | Freq: Two times a day (BID) | ORAL | Status: DC
  Administered 2021-07-08 – 2021-07-10 (×4): 800 mg via ORAL
  Filled 2021-07-08 (×5): qty 2

## 2021-07-08 SURGICAL SUPPLY — 59 items
APL PRP STRL LF DISP 70% ISPRP (MISCELLANEOUS) ×1
APL SKNCLS STERI-STRIP NONHPOA (GAUZE/BANDAGES/DRESSINGS) ×2
BAG DRN RND TRDRP ANRFLXCHMBR (UROLOGICAL SUPPLIES) ×1
BAG URINE DRAIN 2000ML AR STRL (UROLOGICAL SUPPLIES) ×2 IMPLANT
BALLN NEPHROSTOMY (BALLOONS) ×2
BALLOON NEPHROSTOMY (BALLOONS) ×1 IMPLANT
BENZOIN TINCTURE PRP APPL 2/3 (GAUZE/BANDAGES/DRESSINGS) ×4 IMPLANT
BLADE SURG 15 STRL LF DISP TIS (BLADE) ×1 IMPLANT
BLADE SURG 15 STRL SS (BLADE) ×2
CATH FOLEY 2W COUNCIL 5CC 18FR (CATHETERS) ×2 IMPLANT
CATH INTERMIT  6FR 70CM (CATHETERS) ×2 IMPLANT
CATH UROLOGY TORQUE 65 (CATHETERS) ×4 IMPLANT
CATH X-FORCE N30 NEPHROSTOMY (TUBING) ×2 IMPLANT
CHLORAPREP W/TINT 26 (MISCELLANEOUS) ×2 IMPLANT
COVER LIGHT HANDLE STERIS (MISCELLANEOUS) ×4 IMPLANT
DRAPE C-ARM FOLDED MOBILE STRL (DRAPES) ×2 IMPLANT
DRAPE HALF SHEET 40X57 (DRAPES) ×2 IMPLANT
DRAPE LINGEMAN PERC (DRAPES) ×2 IMPLANT
DRAPE SURG IRRIG POUCH 19X23 (DRAPES) IMPLANT
DRSG PAD ABDOMINAL 8X10 ST (GAUZE/BANDAGES/DRESSINGS) ×4 IMPLANT
DRSG TEGADERM 4X10 (GAUZE/BANDAGES/DRESSINGS) ×2 IMPLANT
DRSG TEGADERM 8X12 (GAUZE/BANDAGES/DRESSINGS) ×6 IMPLANT
EXTRACTOR STONE NITINOL NGAGE (UROLOGICAL SUPPLIES) ×2 IMPLANT
GAUZE 4X4 16PLY ~~LOC~~+RFID DBL (SPONGE) ×2 IMPLANT
GAUZE SPONGE 4X4 12PLY STRL (GAUZE/BANDAGES/DRESSINGS) ×2 IMPLANT
GLOVE SRG 8 PF TXTR STRL LF DI (GLOVE) IMPLANT
GLOVE SURG POLYISO LF SZ8 (GLOVE) ×2 IMPLANT
GLOVE SURG UNDER POLY LF SZ7 (GLOVE) ×4 IMPLANT
GLOVE SURG UNDER POLY LF SZ8 (GLOVE)
GOWN STRL REUS W/TWL LRG LVL3 (GOWN DISPOSABLE) ×2 IMPLANT
GOWN STRL REUS W/TWL XL LVL3 (GOWN DISPOSABLE) ×2 IMPLANT
GUIDEWIRE AMPLAZ .035X145 (WIRE) ×2 IMPLANT
GUIDEWIRE STR DUAL SENSOR (WIRE) ×2 IMPLANT
IV NS 500ML (IV SOLUTION) ×2
IV NS 500ML BAXH (IV SOLUTION) ×1 IMPLANT
KIT BLADEGUARD II DBL (SET/KITS/TRAYS/PACK) ×2 IMPLANT
KIT PROBE TRILOGY 3.9X350 (MISCELLANEOUS) ×2 IMPLANT
KIT TURNOVER KIT A (KITS) ×2 IMPLANT
MANIFOLD NEPTUNE II (INSTRUMENTS) ×2 IMPLANT
PACK CYSTO (CUSTOM PROCEDURE TRAY) ×2 IMPLANT
PAD ARMBOARD 7.5X6 YLW CONV (MISCELLANEOUS) ×2 IMPLANT
POSITIONER HEAD PRONE TRACH (MISCELLANEOUS) ×2 IMPLANT
PROBE PNEUMATIC 1.0MMX570MM (UROLOGICAL SUPPLIES) IMPLANT
SET BASIN LINEN APH (SET/KITS/TRAYS/PACK) ×2 IMPLANT
SET IRRIG Y TYPE TUR BLADDER L (SET/KITS/TRAYS/PACK) IMPLANT
SHEATH COOK PEEL AWAY SET 9F (SHEATH) IMPLANT
SHEATH PEELAWAY SET 9 (SHEATH) ×2 IMPLANT
SOL .9 NS 3000ML IRR  AL (IV SOLUTION) ×4
SOL .9 NS 3000ML IRR AL (IV SOLUTION) ×2
SOL .9 NS 3000ML IRR UROMATIC (IV SOLUTION) ×2 IMPLANT
SPONGE LAP 4X18 RFD (DISPOSABLE) ×2 IMPLANT
STENT URET 6FRX28 CONTOUR (STENTS) ×2 IMPLANT
STONE CATCHER W/TUBE ADAPTER (UROLOGICAL SUPPLIES) ×2 IMPLANT
SUT SILK 2 0 SH (SUTURE) ×2 IMPLANT
SYR 50ML LL SCALE MARK (SYRINGE) ×2 IMPLANT
TRAY FOLEY W/BAG SLVR 16FR (SET/KITS/TRAYS/PACK) ×2
TRAY FOLEY W/BAG SLVR 16FR ST (SET/KITS/TRAYS/PACK) ×1 IMPLANT
WATER STERILE IRR 1000ML POUR (IV SOLUTION) ×2 IMPLANT
YANKAUER SUCT BULB TIP 10FT TU (MISCELLANEOUS) ×4 IMPLANT

## 2021-07-08 NOTE — Plan of Care (Signed)

## 2021-07-08 NOTE — Anesthesia Procedure Notes (Signed)
Procedure Name: Intubation Date/Time: 07/08/2021 9:17 AM Performed by: Tacy Learn, CRNA Pre-anesthesia Checklist: Patient identified, Emergency Drugs available, Suction available, Patient being monitored and Timeout performed Patient Re-evaluated:Patient Re-evaluated prior to induction Oxygen Delivery Method: Circle system utilized Preoxygenation: Pre-oxygenation with 100% oxygen Induction Type: IV induction Laryngoscope Size: Mac and 3 Grade View: Grade I Tube type: Oral Tube size: 7.5 mm Number of attempts: 1 Airway Equipment and Method: Stylet and Video-laryngoscopy Placement Confirmation: ETT inserted through vocal cords under direct vision, positive ETCO2, breath sounds checked- equal and bilateral and CO2 detector Secured at: 21 cm Tube secured with: Tape Dental Injury: Teeth and Oropharynx as per pre-operative assessment

## 2021-07-08 NOTE — Interval H&P Note (Signed)
History and Physical Interval Note:  07/08/2021 7:40 AM  Denise Avila  has presented today for surgery, with the diagnosis of Right renal calculus.  The various methods of treatment have been discussed with the patient and family. After consideration of risks, benefits and other options for treatment, the patient has consented to  Procedure(s): NEPHROLITHOTOMY PERCUTANEOUS- with stent placement (Right) HOLMIUM LASER APPLICATION (Right) as a surgical intervention.  The patient's history has been reviewed, patient examined, no change in status, stable for surgery.  I have reviewed the patient's chart and labs.  Questions were answered to the patient's satisfaction.     Nicolette Bang

## 2021-07-08 NOTE — Anesthesia Postprocedure Evaluation (Signed)
Anesthesia Post Note  Patient: Denise Avila  Procedure(s) Performed: NEPHROLITHOTOMY PERCUTANEOUS- with stent placement (Right)  Patient location during evaluation: Phase II Anesthesia Type: General Level of consciousness: awake Pain management: pain level controlled Vital Signs Assessment: post-procedure vital signs reviewed and stable Respiratory status: spontaneous breathing and respiratory function stable Cardiovascular status: blood pressure returned to baseline and stable Postop Assessment: no headache and no apparent nausea or vomiting Anesthetic complications: no Comments: Late entry   No notable events documented.   Last Vitals:  Vitals:   07/08/21 1315 07/08/21 1400  BP: 104/82 (!) 110/57  Pulse: 85 88  Resp: 14   Temp:  36.7 C  SpO2: 96% 99%    Last Pain:  Vitals:   07/08/21 1400  TempSrc: Oral  PainSc:                  Louann Sjogren

## 2021-07-08 NOTE — Transfer of Care (Signed)
Immediate Anesthesia Transfer of Care Note  Patient: Denise Avila  Procedure(s) Performed: NEPHROLITHOTOMY PERCUTANEOUS- with stent placement (Right)  Patient Location: PACU  Anesthesia Type:General  Level of Consciousness: awake, drowsy, patient cooperative and responds to stimulation  Airway & Oxygen Therapy: Patient Spontanous Breathing and Patient connected to face mask oxygen  Post-op Assessment: Report given to RN, Post -op Vital signs reviewed and stable and Patient moving all extremities X 4  Post vital signs: Reviewed and stable  Last Vitals:  Vitals Value Taken Time  BP 120/84 07/08/21 1052  Temp    Pulse 90 07/08/21 1055  Resp 13 07/08/21 1055  SpO2 100 % 07/08/21 1055  Vitals shown include unvalidated device data.  Last Pain:  Vitals:   07/08/21 0731  TempSrc: Oral  PainSc: 6          Complications: No notable events documented.

## 2021-07-08 NOTE — Op Note (Signed)
Preoperative diagnosis: Right renal stone  Postoperative diagnosis: Same  Procedure 1.  Right percutaneous nephrostolithotomy for stone less than 2cm 2.  Right nephrostogram 3.  Intraoperative fluoroscopy, under 1 hour, with interpretation 4.  Placement of a 6 x 26 double-J ureteral stent. 5.  Placement of a 41 French nephrostomy tube 6.  Dilation of percutaneous tract  Attending: Nicolette Bang, MD  Anesthesia: General  Estimated blood loss: Minimal  Antibiotics: gentamcinin  Drains: 1.  16 French Foley catheter 2.  6 x 26 right double-J ureteral stent 3.  18 French nephrostomy tube  Specimens: Stone for analysis  Findings: 1.6cm UPJ calculus. Limited drainage of contrast down the ureter prior to removing UPJ calculus. Minimal extravasation following PCNL  Indications: Patient is a 45 year old female with a history of large right renal calculus  After discussing treatment options and decided she was right percutaneous nephrostolithotomy.  Patient already has a nephrostomy tube.  Procedure in detail: Prior to procedure consent was obtained.  Patient was brought to the operating room debridement was done to ensure correct patient, correct procedure, and correct site.  General anesthesia was administered.  A 16 French Foley catheter was in place.  The patient was then placed in the prone position.  His nephrostomy tube and right flank was then prepped and draped in usual sterile fashion.  A nephrostogram was obtained and findings noted above.  Through the nephrostomy tube we then placed a sensor wire.  Sensor wire was coiled in the renal pelvis we then removed the nephrostomy tube.  We then made an incision at the level of the skin and over the wire we then placed a NephroMax dilator.  We dilated the nephrostomy tract to 29 [place] and held this 18 cm of water for 1 minute.  We then  placed the access sheath over the balloon.  The balloon was then deflated.  We then used a rigid  nephroscope to perform nephroscopy.  We encountered a large UPJ calculus.  We then used a Trilogy to fragment the stone in multiple pieces.  Using the graspers we removed stone fragments and sent for composition analysis.  Once the stone was removed we then were able to perform nephroscopy with a flexible nephroscope.   We then placed a second wire through the ureteroscope into the bladder.  We then removed the nephroscope and over the wire placed a 6 x 26 double-J ureteral stent.  The wire was then removed and good coil was noted in the renal pelvis under direct vision in the bladder under fluoroscopy.  We then placed a 18 French nephrostomy tube through the sheath into the renal pelvis.  The balloon was inflated with 54ml of contrast.  We then removed the access sheath in and obtain another nephrostogram.  We noted minimal extravasation of contrast.  We then secured the nephrostomy tubes with 0 silks in interrupted fashion.  Dressing was placed over the nephrostomy tube site and this then concluded the procedure was well-tolerated by the patient.  Complications: None  Condition: Stable, extubated, transferred to PACU  Plan: Patient is to be admitted overnight for observation.  Her Foley catheter will be removed in the morning. Nephrostomy tube will be removed in 5 days. She is then to be discharged home and followup in 1 week for stent removal

## 2021-07-08 NOTE — Anesthesia Preprocedure Evaluation (Signed)
Anesthesia Evaluation  Patient identified by MRN, date of birth, ID band Patient awake    Reviewed: Allergy & Precautions, H&P , NPO status , Patient's Chart, lab work & pertinent test results, reviewed documented beta blocker date and time   Airway Mallampati: II  TM Distance: >3 FB Neck ROM: full    Dental no notable dental hx.    Pulmonary neg pulmonary ROS,    Pulmonary exam normal breath sounds clear to auscultation       Cardiovascular Exercise Tolerance: Good negative cardio ROS   Rhythm:regular Rate:Normal     Neuro/Psych  Headaches, Seizures -, Well Controlled,  PSYCHIATRIC DISORDERS Anxiety Depression    GI/Hepatic Neg liver ROS, GERD  Medicated,  Endo/Other  negative endocrine ROS  Renal/GU Renal disease  negative genitourinary   Musculoskeletal   Abdominal   Peds  Hematology negative hematology ROS (+)   Anesthesia Other Findings   Reproductive/Obstetrics negative OB ROS                             Anesthesia Physical Anesthesia Plan  ASA: 3  Anesthesia Plan: General   Post-op Pain Management:    Induction:   PONV Risk Score and Plan: Ondansetron  Airway Management Planned:   Additional Equipment:   Intra-op Plan:   Post-operative Plan:   Informed Consent: I have reviewed the patients History and Physical, chart, labs and discussed the procedure including the risks, benefits and alternatives for the proposed anesthesia with the patient or authorized representative who has indicated his/her understanding and acceptance.     Dental Advisory Given  Plan Discussed with: CRNA  Anesthesia Plan Comments:         Anesthesia Quick Evaluation

## 2021-07-09 ENCOUNTER — Encounter (HOSPITAL_COMMUNITY): Payer: Self-pay | Admitting: Urology

## 2021-07-09 DIAGNOSIS — Z8542 Personal history of malignant neoplasm of other parts of uterus: Secondary | ICD-10-CM | POA: Diagnosis not present

## 2021-07-09 DIAGNOSIS — N200 Calculus of kidney: Secondary | ICD-10-CM | POA: Diagnosis not present

## 2021-07-09 DIAGNOSIS — Z85828 Personal history of other malignant neoplasm of skin: Secondary | ICD-10-CM | POA: Diagnosis not present

## 2021-07-09 LAB — BASIC METABOLIC PANEL
Anion gap: 7 (ref 5–15)
BUN: 16 mg/dL (ref 6–20)
CO2: 26 mmol/L (ref 22–32)
Calcium: 8.6 mg/dL — ABNORMAL LOW (ref 8.9–10.3)
Chloride: 107 mmol/L (ref 98–111)
Creatinine, Ser: 0.56 mg/dL (ref 0.44–1.00)
GFR, Estimated: >60 mL/min (ref >60)
Glucose, Bld: 120 mg/dL — ABNORMAL HIGH (ref 70–99)
Potassium: 4.1 mmol/L (ref 3.5–5.1)
Sodium: 140 mmol/L (ref 135–145)

## 2021-07-09 LAB — CBC
HCT: 35.1 % — ABNORMAL LOW (ref 36.0–46.0)
Hemoglobin: 11.6 g/dL — ABNORMAL LOW (ref 12.0–15.0)
MCH: 31.1 pg (ref 26.0–34.0)
MCHC: 33 g/dL (ref 30.0–36.0)
MCV: 94.1 fL (ref 80.0–100.0)
Platelets: 167 10*3/uL (ref 150–400)
RBC: 3.73 MIL/uL — ABNORMAL LOW (ref 3.87–5.11)
RDW: 12.3 % (ref 11.5–15.5)
WBC: 11 10*3/uL — ABNORMAL HIGH (ref 4.0–10.5)
nRBC: 0 % (ref 0.0–0.2)

## 2021-07-09 MED ORDER — OXYCODONE-ACETAMINOPHEN 5-325 MG PO TABS: 1.0000 | ORAL_TABLET | ORAL | 0 refills | Status: DC | PRN

## 2021-07-09 MED ORDER — POLYETHYLENE GLYCOL 3350 17 G PO PACK
17.0000 g | PACK | Freq: Every day | ORAL | Status: DC
  Filled 2021-07-09: qty 1

## 2021-07-09 MED ORDER — PROMETHAZINE HCL 25 MG PO TABS: 25.0000 mg | ORAL_TABLET | Freq: Four times a day (QID) | ORAL | 0 refills | Status: DC | PRN

## 2021-07-09 MED ORDER — BELLADONNA ALKALOIDS-OPIUM 16.2-60 MG RE SUPP: 1.0000 | Freq: Three times a day (TID) | RECTAL | Status: DC | PRN

## 2021-07-09 NOTE — Progress Notes (Signed)
Patient called out and stated she had a migraine. She was given 2 oxycodone at 0250. She called back out in 10 minutes stating she wanted something for migraine and I reminded her that she was just given something for a migraine. I went in the room and she stated that she takes benadryl and phenergan at home for her migraines. I informed her that she did not have phenergan ordered but that I could give her benadryl. Benadryl 25 mg was given IV.

## 2021-07-09 NOTE — Progress Notes (Signed)
Dressing on right flank is showing more drainage than it did at start of shift on initial assessment. I marked the new areas with a black marker at this time.

## 2021-07-09 NOTE — Progress Notes (Signed)
Changed bandage due to significant amount of drainage and patient complaining of itching. Patient tolerated removal of dressing, cleaned the area with saline and gauze, and applied clean dressing. She has 2 I.V. drain sponges applied around the nephrostomy tubing, followed by an abdominal pad and then two Tegaderm securing everything in place. Patient did have a few whelped areas at the top of the dressing, but those are now exposed to air. Will continue to monitor patient during shift.

## 2021-07-09 NOTE — Progress Notes (Signed)
Foley catheter removed without incident.

## 2021-07-09 NOTE — Progress Notes (Signed)
Pt requested prn for itching and for pain stated it was an 10 of 10. Will continue to monitor pt. Educated on pain management.

## 2021-07-10 DIAGNOSIS — Z8542 Personal history of malignant neoplasm of other parts of uterus: Secondary | ICD-10-CM | POA: Diagnosis not present

## 2021-07-10 DIAGNOSIS — Z85828 Personal history of other malignant neoplasm of skin: Secondary | ICD-10-CM | POA: Diagnosis not present

## 2021-07-10 DIAGNOSIS — N200 Calculus of kidney: Secondary | ICD-10-CM | POA: Diagnosis not present

## 2021-07-12 NOTE — Progress Notes (Signed)
Post-op call completed.  Pt c/o pain 7/10 not relieved with pain meds.  States unsure if has a fever but having hot/cold flashes.  States she does not have a thermometer at home to check.  Pt states she tried to call MD's office but it is closed today. Dr. Noland Fordyce office number given for [facility].  Advised pt to call and request to speak with his nurse.  Pt verbalized understanding.

## 2021-07-13 LAB — CALCULI, WITH PHOTOGRAPH (CLINICAL LAB)
Calcium Oxalate Dihydrate: 100 %
Weight Calculi: 435 mg

## 2021-07-14 ENCOUNTER — Other Ambulatory Visit: Payer: Self-pay

## 2021-07-14 ENCOUNTER — Encounter: Payer: Self-pay | Admitting: Urology

## 2021-07-14 ENCOUNTER — Ambulatory Visit (INDEPENDENT_AMBULATORY_CARE_PROVIDER_SITE_OTHER): Payer: Medicare HMO | Admitting: Urology

## 2021-07-14 VITALS — BP 113/73 | HR 116

## 2021-07-14 DIAGNOSIS — N200 Calculus of kidney: Secondary | ICD-10-CM

## 2021-07-14 DIAGNOSIS — F4310 Post-traumatic stress disorder, unspecified: Secondary | ICD-10-CM | POA: Diagnosis not present

## 2021-07-14 DIAGNOSIS — F331 Major depressive disorder, recurrent, moderate: Secondary | ICD-10-CM | POA: Diagnosis not present

## 2021-07-14 MED ORDER — OXYCODONE-ACETAMINOPHEN 5-325 MG PO TABS: 1.0000 | ORAL_TABLET | ORAL | 0 refills | Status: DC | PRN

## 2021-07-14 MED ORDER — SULFAMETHOXAZOLE-TRIMETHOPRIM 800-160 MG PO TABS: 1.0000 | ORAL_TABLET | Freq: Two times a day (BID) | ORAL | 0 refills | Status: DC

## 2021-07-14 NOTE — Progress Notes (Signed)
Urological Symptom Review  Patient is experiencing the following symptoms: Frequent urination Hard to postpone urination Burning/pain with urination Get up at night to urinate Leakage of urine Stream starts and stops Trouble starting stream Have to strain to urinate Blood in urine Kidney stones   Review of Systems  Gastrointestinal (upper)  : Nausea Indigestion/heartburn  Gastrointestinal (lower) : Diarrhea  Constitutional : Fever Night Sweats Fatigue  Skin: Skin rash/lesion Itching  Eyes: Negative for eye symptoms  Ear/Nose/Throat : Sore throat  Hematologic/Lymphatic: Swollen glands Easy bruising  Cardiovascular : Negative for cardiovascular symptoms  Respiratory : Negative for respiratory symptoms  Endocrine: Negative for endocrine symptoms  Musculoskeletal: Back pain Joint pain  Neurological: Headaches Dizziness  Psychologic: Depression Anxiety

## 2021-07-14 NOTE — Patient Instructions (Signed)
Dietary Guidelines to Help Prevent Kidney Stones Kidney stones are deposits of minerals and salts that form inside your kidneys. Your risk of developing kidney stones may be greater depending on your diet, your lifestyle, the medicines you take, and whether you have certain medical conditions. Most people can lower their chances of developing kidney stones by following the instructions below. Your dietitian may give you more specific instructions depending on your overall health and the type of kidney stones you tend to develop. What are tips for following this plan? Reading food labels  Choose foods with "no salt added" or "low-salt" labels. Limit your salt (sodium) intake to less than 1,500 mg a day. Choose foods with calcium for each meal and snack. Try to eat about 300 mg of calcium at each meal. Foods that contain 200-500 mg of calcium a serving include: 8 oz (237 mL) of milk, calcium-fortifiednon-dairy milk, and calcium-fortifiedfruit juice. Calcium-fortified means that calcium has been added to these drinks. 8 oz (237 mL) of kefir, yogurt, and soy yogurt. 4 oz (114 g) of tofu. 1 oz (28 g) of cheese. 1 cup (150 g) of dried figs. 1 cup (91 g) of cooked broccoli. One 3 oz (85 g) can of sardines or mackerel. Most people need 1,000-1,500 mg of calcium a day. Talk to your dietitian about how much calcium is recommended for you. Shopping Buy plenty of fresh fruits and vegetables. Most people do not need to avoid fruits and vegetables, even if these foods contain nutrients that may contribute to kidney stones. When shopping for convenience foods, choose: Whole pieces of fruit. Pre-made salads with dressing on the side. Low-fat fruit and yogurt smoothies. Avoid buying frozen meals or prepared deli foods. These can be high in sodium. Look for foods with live cultures, such as yogurt and kefir. Choose high-fiber grains, such as whole-wheat breads, oat bran, and wheat cereals. Cooking Do not add  salt to food when cooking. Place a salt shaker on the table and allow each person to add his or her own salt to taste. Use vegetable protein, such as beans, textured vegetable protein (TVP), or tofu, instead of meat in pasta, casseroles, and soups. Meal planning Eat less salt, if told by your dietitian. To do this: Avoid eating processed or pre-made food. Avoid eating fast food. Eat less animal protein, including cheese, meat, poultry, or fish, if told by your dietitian. To do this: Limit the number of times you have meat, poultry, fish, or cheese each week. Eat a diet free of meat at least 2 days a week. Eat only one serving each day of meat, poultry, fish, or seafood. When you prepare animal protein, cut pieces into small portion sizes. For most meat and fish, one serving is about the size of the palm of your hand. Eat at least five servings of fresh fruits and vegetables each day. To do this: Keep fruits and vegetables on hand for snacks. Eat one piece of fruit or a handful of berries with breakfast. Have a salad and fruit at lunch. Have two kinds of vegetables at dinner. Limit foods that are high in a substance called oxalate. These include: Spinach (cooked), rhubarb, beets, sweet potatoes, and Swiss chard. Peanuts. Potato chips, french fries, and baked potatoes with skin on. Nuts and nut products. Chocolate. If you regularly take a diuretic medicine, make sure to eat at least 1 or 2 servings of fruits or vegetables that are high in potassium each day. These include: Avocado. Banana. Orange, prune,   carrot, or tomato juice. Baked potato. Cabbage. Beans and split peas. Lifestyle  Drink enough fluid to keep your urine pale yellow. This is the most important thing you can do. Spread your fluid intake throughout the day. If you drink alcohol: Limit how much you use to: 0-1 drink a day for women who are not pregnant. 0-2 drinks a day for men. Be aware of how much alcohol is in your  drink. In the [place] one drink equals one 12 oz bottle of beer (355 mL), one 5 oz glass of wine (148 mL), or one 1 oz glass of hard liquor (44 mL). Lose weight if told by your health care provider. Work with your dietitian to find an eating plan and weight loss strategies that work best for you. General information Talk to your health care provider and dietitian about taking daily supplements. You may be told the following depending on your health and the cause of your kidney stones: Not to take supplements with vitamin C. To take a calcium supplement. To take a daily probiotic supplement. To take other supplements such as magnesium, fish oil, or vitamin B6. Take over-the-counter and prescription medicines only as told by your health care provider. These include supplements. What foods should I limit? Limit your intake of the following foods, or eat them as told by your dietitian. Vegetables Spinach. Rhubarb. Beets. Canned vegetables. Pickles. Olives. Baked potatoes with skin. Grains Wheat bran. Baked goods. Salted crackers. Cereals high in sugar. Meats and other proteins Nuts. Nut butters. Large portions of meat, poultry, or fish. Salted, precooked, or cured meats, such as sausages, meat loaves, and hot dogs. Dairy Cheese. Beverages Regular soft drinks. Regular vegetable juice. Seasonings and condiments Seasoning blends with salt. Salad dressings. Soy sauce. Ketchup. Barbecue sauce. Other foods Canned soups. Canned pasta sauce. Casseroles. Pizza. Lasagna. Frozen meals. Potato chips. French fries. The items listed above may not be a complete list of foods and beverages you should limit. Contact a dietitian for more information. What foods should I avoid? Talk to your dietitian about specific foods you should avoid based on the type of kidney stones you have and your overall health. Fruits Grapefruit. The item listed above may not be a complete list of foods and beverages you should  avoid. Contact a dietitian for more information. Summary Kidney stones are deposits of minerals and salts that form inside your kidneys. You can lower your risk of kidney stones by making changes to your diet. The most important thing you can do is drink enough fluid. Drink enough fluid to keep your urine pale yellow. Talk to your dietitian about how much calcium you should have each day, and eat less salt and animal protein as told by your dietitian. This information is not intended to replace advice given to you by your health care provider. Make sure you discuss any questions you have with your health care provider. Document Revised: 09/12/2019 Document Reviewed: 09/12/2019 Elsevier Patient Education  2022 Elsevier Inc.  

## 2021-07-14 NOTE — Progress Notes (Signed)
07/14/2021 9:52 AM   [name] 27-May-1976 [id number]  Referring provider: Lennie Odor, [place] [address] [employer] [address],  [place] [id number]  [place] after right PCNL   HPI: Ms Seres is a 45yo here for followup after right PCNL. She has mild right flank pain. She has urinary urgency, frequency, and dysuria with the right ureteral stent in place. No fevers. No hematuria.    PMH: Past Medical History:  Diagnosis Date   Abnormal Pap smear of cervix 12/2014   Acid reflux    Adrenal insufficiency (HCC)    Anxiety    Arthritis    Arthritis    Depression    Depression    Endometriosis    Fibroids    age 29   High cholesterol    History of kidney stones    Liver disease    per pt report, will have biopsy Nov 08, 2017   Migraines    Pleurisy 02/2015   while hospitalized    Seizure ([facility])    on meds and no seizures in 2-3 years. Unknown etiology   Skin cancer (melanoma) ([place])    Syncope and collapse    Uterine cancer [facility])     Surgical History: Past Surgical History:  Procedure Laterality Date   ABDOMINAL HYSTERECTOMY     ABLATION     uterine   COLECTOMY     CYSTOSCOPY W/ URETERAL STENT PLACEMENT Left 02/02/2015   Procedure: CYSTOSCOPY WITH RETROGRADE PYELOGRAM/URETERAL STENT PLACEMENT;  Surgeon: Festus Aloe, MD;  Location: [facility];  Service: Urology;  Laterality: Left;   CYSTOSCOPY WITH URETEROSCOPY AND STENT PLACEMENT Left 02/13/2015   Procedure: LEFT URETEROSCOPY WITH HOLMIUM LASER AND STENT PLACEMENT;  Surgeon: Festus Aloe, MD;  Location: [facility];  Service: Urology;  Laterality: Left;   DILATION AND CURETTAGE OF UTERUS     ESOPHAGOGASTRODUODENOSCOPY ENDOSCOPY  10/11/2017   @ [facility]; BIOPSY OF LOWER INTESTINE   HOLMIUM LASER APPLICATION N/A 01/65/5374   Procedure: HOLMIUM LASER APPLICATION;  Surgeon: Festus Aloe, MD;  Location: [facility];  Service: Urology;  Laterality: N/A;   IR NEPHROSTOMY PLACEMENT RIGHT  07/05/2021   KNEE SURGERY  Bilateral 2019   realignment of knees   LAPAROSCOPIC GASTRIC SLEEVE RESECTION     LAPAROSCOPY     x 4   MELANOMA EXCISION  2000   melanoma removed from face, basal cell removed from nose   NEPHROLITHOTOMY Right 07/08/2021   Procedure: NEPHROLITHOTOMY PERCUTANEOUS- with stent placement;  Surgeon: Cleon Gustin, MD;  Location: [facility];  Service: Urology;  Laterality: Right;   REPAIR VAGINAL CUFF N/A 08/21/2015   Procedure: REPAIR VAGINAL CUFF, EXAM UNDER ANESTHESIA;  Surgeon: Everitt Amber, MD;  Location: [facility];  Service: Gynecology;  Laterality: N/A;   ROBOTIC ASSISTED TOTAL HYSTERECTOMY WITH BILATERAL SALPINGO OOPHERECTOMY Bilateral 05/21/2015   Procedure: XI ROBOTIC ASSISTED TOTAL HYSTERECTOMY WITH BILATERAL SALPINGO OOPHORECTOMY;  Surgeon: Everitt Amber, MD;  Location: [facility];  Service: Gynecology;  Laterality: Bilateral;    Home Medications:  Allergies as of 07/14/2021       Reactions   Penicillins Swelling, Rash   Rash all over [place] and lip swelling. Has patient had a PCN reaction causing immediate rash, facial/tongue/throat swelling, SOB or lightheadedness with hypotension: Yes Has patient had a PCN reaction causing severe rash involving mucus membranes or skin necrosis: Yes Has patient had a PCN reaction that required hospitalization: No If all of the above answers are "NO", then may proceed with Cephalosporin use.  Nortriptyline Other (See Comments)   Severe chest pain    Nsaids Other (See Comments)   Advised not to take due to gastric sleeve    Amoxicillin Rash   Rash all over [place]   Ciprofloxacin Diarrhea, Nausea Only        Medication List        Accurate as of July 14, 2021  9:52 AM. If you have any questions, ask your nurse or doctor.          baclofen 10 MG tablet Commonly known as: LIORESAL Take 10 mg by mouth 3 (three) times daily.   celecoxib 200 MG capsule Commonly known as: CELEBREX Take 200 mg by mouth daily.   citalopram 10 MG  tablet Commonly known as: CELEXA Take 10 mg by mouth daily.   clonazePAM 1 MG tablet Commonly known as: KLONOPIN Take 1 mg by mouth daily.   diphenhydrAMINE 25 MG tablet Commonly known as: BENADRYL Take 25 mg by mouth every 6 (six) hours as needed (take with promethazine for migraines).   DULoxetine 60 MG capsule Commonly known as: CYMBALTA Take 60 mg by mouth at bedtime.   DULoxetine 30 MG capsule Commonly known as: CYMBALTA Take 30 mg by mouth daily.   ezetimibe 10 MG tablet Commonly known as: ZETIA Take 10 mg by mouth daily.   gabapentin 800 MG tablet Commonly known as: NEURONTIN Take 1,600 tablets by mouth 2 (two) times daily.   GAS-X PO Take 1-2 tablets by mouth 3 (three) times daily as needed (gas).   lamoTRIgine 200 MG tablet Commonly known as: LAMICTAL Take 200 mg by mouth every evening.   methenamine 1 g tablet Commonly known as: MANDELAMINE Take 1 tablet (1,000 mg total) by mouth 2 (two) times daily.   naratriptan 2.5 MG tablet Commonly known as: AMERGE Take 2.5 mg by mouth as needed for migraine. Take one (1) tablet at onset of headache; if returns or does not resolve, may repeat after 4 hours; do not exceed five (5) mg in 24 hours.   omeprazole 40 MG capsule Commonly known as: PRILOSEC Take 40 mg by mouth 2 (two) times daily.   OVER THE COUNTER MEDICATION Take 1 tablet by mouth daily. Bariatric Pal otc vitamin   oxyCODONE-acetaminophen 5-325 MG tablet Commonly known as: Percocet Take 1 tablet by mouth every 4 (four) hours as needed.   promethazine 25 MG tablet Commonly known as: PHENERGAN Take 1 tablet (25 mg total) by mouth 4 (four) times daily as needed for nausea or vomiting.   QUEtiapine 25 MG tablet Commonly known as: SEROQUEL Take 50 mg by mouth at bedtime.   rizatriptan 10 MG tablet Commonly known as: MAXALT Take 10 mg by mouth as needed for migraine.   rosuvastatin 5 MG tablet Commonly known as: CRESTOR Take 5 mg by mouth  daily.   sucralfate 1 g tablet Commonly known as: Carafate Take 1 tablet (1 g total) by mouth 4 (four) times daily.   tiZANidine 2 MG tablet Commonly known as: ZANAFLEX Take 2 mg by mouth 3 (three) times daily.   Toviaz 4 MG Tb24 tablet Generic drug: fesoterodine TAKE 1 TABLET(4 MG) BY MOUTH DAILY   Ubrelvy 100 MG Tabs Generic drug: Ubrogepant Take 100 mg by mouth daily as needed (migraines).        Allergies:  Allergies  Allergen Reactions   Penicillins Swelling and Rash    Rash all over [place] and lip swelling. Has patient had a PCN reaction causing immediate rash,  facial/tongue/throat swelling, SOB or lightheadedness with hypotension: Yes Has patient had a PCN reaction causing severe rash involving mucus membranes or skin necrosis: Yes Has patient had a PCN reaction that required hospitalization: No If all of the above answers are "NO", then may proceed with Cephalosporin use.   Nortriptyline Other (See Comments)    Severe chest pain     Nsaids Other (See Comments)    Advised not to take due to gastric sleeve    Amoxicillin Rash    Rash all over [place]    Ciprofloxacin Diarrhea and Nausea Only    Family History: Family History  Problem Relation Age of Onset   Hypertension Mother    Melanoma Mother    Breast cancer Mother 60   Cancer - Other Father        liver   Diabetes Mellitus II Maternal Grandmother    Heart disease Maternal Grandmother    Breast cancer Maternal Grandmother    Cancer - Lung Maternal Grandfather    Osteoporosis Paternal Grandmother    Melanoma Paternal Grandfather     Social History:  reports that she has never smoked. She has never used smokeless tobacco. She reports that she does not drink alcohol and does not use drugs.  ROS: All other review of systems were reviewed and are negative except what is noted above in HPI  Physical Exam: BP 113/73   Pulse (!) 116   LMP 05/04/2015 (Approximate)   Constitutional:  Alert and oriented,  No acute distress. HEENT: [place] AT, moist mucus membranes.  Trachea midline, no masses. Cardiovascular: No clubbing, cyanosis, or edema. Respiratory: Normal respiratory effort, no increased work of breathing. GI: Abdomen is soft, nontender, nondistended, no abdominal masses GU: No CVA tenderness.  Lymph: No cervical or inguinal lymphadenopathy. Skin: No rashes, bruises or suspicious lesions. Neurologic: Grossly intact, no focal deficits, moving all 4 extremities. Psychiatric: Normal mood and affect.  Laboratory Data: Lab Results  Component Value Date   WBC 11.0 (H) 07/09/2021   HGB 11.6 (L) 07/09/2021   HCT 35.1 (L) 07/09/2021   MCV 94.1 07/09/2021   PLT 167 07/09/2021    Lab Results  Component Value Date   CREATININE 0.56 07/09/2021    No results found for: PSA  No results found for: TESTOSTERONE  No results found for: HGBA1C  Urinalysis    Component Value Date/Time   COLORURINE YELLOW (A) 06/21/2017 1735   APPEARANCEUR Clear 06/28/2021 1411   LABSPEC 1.019 06/21/2017 1735   PHURINE 5.0 06/21/2017 1735   GLUCOSEU Negative 06/28/2021 1411   HGBUR NEGATIVE 06/21/2017 1735   BILIRUBINUR Negative 06/28/2021 1411   KETONESUR NEGATIVE 06/21/2017 1735   PROTEINUR 2+ (A) 06/28/2021 1411   PROTEINUR NEGATIVE 06/21/2017 1735   UROBILINOGEN 0.2 01/27/2020 1124   UROBILINOGEN 1.0 05/18/2015 1359   NITRITE Negative 06/28/2021 1411   NITRITE NEGATIVE 06/21/2017 1735   LEUKOCYTESUR Trace (A) 06/28/2021 1411    Lab Results  Component Value Date   LABMICR See below: 06/28/2021   WBCUA 0-5 06/28/2021   LABEPIT 0-10 06/28/2021   BACTERIA Few (A) 06/28/2021    Pertinent Imaging:  Results for orders placed during the hospital encounter of 11/27/15  DG Abd 1 View  Narrative CLINICAL DATA:  Constipation, Sitz mark study ; markers ingested 5 days ago, left upper quadrant and left lower quadrant abdominal pain.  EXAM: ABDOMEN - 1 VIEW  COMPARISON:  [facility] images of  November 25, 2015  FINDINGS: There is a single Sitz marker  that projects over the distal transverse colon. There are 3 Sitz markers that project in the pelvis likely in the sigmoid. The stool burden within the ascending and transverse colon is moderate. The descending and rectosigmoid colonic stool burden is relatively small. There is no small bowel obstruction. The bony structures are unremarkable.  IMPRESSION: There has been further progression of the Sitz markers. The colonic stool burden in the right and transverse colon is moderate.   Electronically Signed By: David  Martinique M.D. On: 11/27/2015 14:10  No results found for this or any previous visit.  No results found for this or any previous visit.  No results found for this or any previous visit.  Results for orders placed during the hospital encounter of 01/01/18  US RENAL  Narrative CLINICAL DATA:  Pelvic pain.  EXAM: RENAL / URINARY TRACT ULTRASOUND COMPLETE  COMPARISON:  None.  FINDINGS: Right Kidney:  Length: 12.1 cm. Echogenicity within normal limits. No mass or hydronephrosis visualized.  Left Kidney:  Length: 12.5 cm. Echogenicity within normal limits. No mass or hydronephrosis visualized.  Bladder:  Appears normal for degree of bladder distention. BILATERAL ureteral jets.  IMPRESSION: Negative.   Electronically Signed By: Staci Righter M.D. On: 01/01/2018 13:49  No results found for this or any previous visit.  No results found for this or any previous visit.  Results for orders placed during the hospital encounter of 01/01/20  CT RENAL STONE STUDY  Narrative CLINICAL DATA:  Right flank pain radiating to right groin. Hematuria. Urinary frequency. History of kidney stones.  EXAM: CT ABDOMEN AND PELVIS WITHOUT CONTRAST  TECHNIQUE: Multidetector CT imaging of the abdomen and pelvis was performed following the standard protocol without IV contrast.  COMPARISON:   08/21/2015  FINDINGS: Lower chest: Lung bases are clear.  Hepatobiliary: Unenhanced liver is unremarkable.  Gallbladder is unremarkable. No intrahepatic or extrahepatic ductal dilatation.  Pancreas: Within normal limits.  Spleen: Within normal limits.  Adrenals/Urinary Tract: Adrenal glands are within normal limits.  Suspected 14 mm cyst in the posterior right upper kidney (series 2/image 28), poorly evaluated. 3 mm nonobstructing right lower pole renal calculus (series 2/image 40). Left kidney is within normal limits.  No ureteral or bladder calculi.  No hydronephrosis.  Bladder is within normal limits.  Stomach/Bowel: Postsurgical changes involving the stomach.  Status post subtotal colectomy with ileorectal anastomosis.  No evidence of bowel obstruction.  Appendix is surgically absent.  Vascular/Lymphatic: No evidence of abdominal aortic aneurysm.  11 mm short axis node in the right mid abdomen, likely reflecting an ileocolic node (series 2/image 85), borderline enlarged.  Reproductive: Status post hysterectomy.  No adnexal masses.  Other: No abdominopelvic ascites.  Musculoskeletal: Visualized osseous structures are within normal limits.  IMPRESSION: 3 mm nonobstructing right lower pole renal calculus. No ureteral or bladder calculi. No hydronephrosis.  Status post subtotal colectomy with ileorectal anastomosis. Additional postsurgical changes as above.  11 mm short axis ileocolic node in the right mid abdomen. In the absence of known malignancy, this is likely reactive given adjacent postsurgical changes. In the setting of malignancy, consider follow-up in 3-6 months.   Electronically Signed By: Julian Hy M.D. On: 01/01/2020 21:17   Assessment & Plan:    1. Kidney stones -right nephrostomy tube removed today. RTC 1 week for right stent removal   No follow-ups on file.  Nicolette Bang, MD  [facility] [place]

## 2021-07-20 ENCOUNTER — Encounter: Payer: Self-pay | Admitting: Urology

## 2021-07-20 DIAGNOSIS — M533 Sacrococcygeal disorders, not elsewhere classified: Secondary | ICD-10-CM | POA: Diagnosis not present

## 2021-07-20 DIAGNOSIS — R0789 Other chest pain: Secondary | ICD-10-CM | POA: Diagnosis not present

## 2021-07-20 DIAGNOSIS — G894 Chronic pain syndrome: Secondary | ICD-10-CM | POA: Diagnosis not present

## 2021-07-20 DIAGNOSIS — M79644 Pain in right finger(s): Secondary | ICD-10-CM | POA: Diagnosis not present

## 2021-07-20 DIAGNOSIS — M797 Fibromyalgia: Secondary | ICD-10-CM | POA: Diagnosis not present

## 2021-07-20 DIAGNOSIS — M5441 Lumbago with sciatica, right side: Secondary | ICD-10-CM | POA: Diagnosis not present

## 2021-07-20 NOTE — Progress Notes (Signed)
07/06/2021 8:03 AM   Denise Avila August 15, 1976 [id number]  Referring provider: Lennie Avila, [address] [employer] [place],  [place] [address]  Followup nephrolithiasis   HPI: Denise Avila is a 45yo here for followup for nephrolithiasis. She underwent right nephrostomy tube placement yesterday and since nephrostomy placement she has had pain and itching around nephrostomy tube site. NO worsening LUTS. Urine clear in nephrostomy tube bag. No fevers.    PMH: Past Medical History:  Diagnosis Date   Abnormal Pap smear of cervix 12/2014   Acid reflux    Adrenal insufficiency (HCC)    Anxiety    Arthritis    Arthritis    Depression    Depression    Endometriosis    Fibroids    age 44   High cholesterol    History of kidney stones    Liver disease    per pt report, will have biopsy Nov 08, 2017   Migraines    Pleurisy 02/2015   while hospitalized    Seizure ([place])    on meds and no seizures in 2-3 years. Unknown etiology   Skin cancer (melanoma) ([place])    Syncope and collapse    Uterine cancer [facility])     Surgical History: Past Surgical History:  Procedure Laterality Date   ABDOMINAL HYSTERECTOMY     ABLATION     uterine   COLECTOMY     CYSTOSCOPY W/ URETERAL STENT PLACEMENT Left 02/02/2015   Procedure: CYSTOSCOPY WITH RETROGRADE PYELOGRAM/URETERAL STENT PLACEMENT;  Surgeon: Festus Aloe, MD;  Location: [facility];  Service: Urology;  Laterality: Left;   CYSTOSCOPY WITH URETEROSCOPY AND STENT PLACEMENT Left 02/13/2015   Procedure: LEFT URETEROSCOPY WITH HOLMIUM LASER AND STENT PLACEMENT;  Surgeon: Festus Aloe, MD;  Location: [facility];  Service: Urology;  Laterality: Left;   DILATION AND CURETTAGE OF UTERUS     ESOPHAGOGASTRODUODENOSCOPY ENDOSCOPY  10/11/2017   @ [facility]; BIOPSY OF LOWER INTESTINE   HOLMIUM LASER APPLICATION N/A 10/93/2355   Procedure: HOLMIUM LASER APPLICATION;  Surgeon: Festus Aloe, MD;  Location: [facility];  Service: Urology;   Laterality: N/A;   IR NEPHROSTOMY PLACEMENT RIGHT  07/05/2021   KNEE SURGERY Bilateral 2019   realignment of knees   LAPAROSCOPIC GASTRIC SLEEVE RESECTION     LAPAROSCOPY     x 4   MELANOMA EXCISION  2000   melanoma removed from face, basal cell removed from nose   NEPHROLITHOTOMY Right 07/08/2021   Procedure: NEPHROLITHOTOMY PERCUTANEOUS- with stent placement;  Surgeon: Cleon Gustin, MD;  Location: [facility];  Service: Urology;  Laterality: Right;   REPAIR VAGINAL CUFF N/A 08/21/2015   Procedure: REPAIR VAGINAL CUFF, EXAM UNDER ANESTHESIA;  Surgeon: Everitt Amber, MD;  Location: [facility];  Service: Gynecology;  Laterality: N/A;   ROBOTIC ASSISTED TOTAL HYSTERECTOMY WITH BILATERAL SALPINGO OOPHERECTOMY Bilateral 05/21/2015   Procedure: XI ROBOTIC ASSISTED TOTAL HYSTERECTOMY WITH BILATERAL SALPINGO OOPHORECTOMY;  Surgeon: Everitt Amber, MD;  Location: [facility];  Service: Gynecology;  Laterality: Bilateral;    Home Medications:  Allergies as of 07/06/2021       Reactions   Penicillins Swelling, Rash   Rash all over [place] and lip swelling. Has patient had a PCN reaction causing immediate rash, facial/tongue/throat swelling, SOB or lightheadedness with hypotension: Yes Has patient had a PCN reaction causing severe rash involving mucus membranes or skin necrosis: Yes Has patient had a PCN reaction that required hospitalization: No If all of the above answers are "NO", then may  proceed with Cephalosporin use.   Nortriptyline Other (See Comments)   Severe chest pain    Nsaids Other (See Comments)   Advised not to take due to gastric sleeve    Amoxicillin Rash   Rash all over [place]   Ciprofloxacin Diarrhea, Nausea Only        Medication List        Accurate as of July 06, 2021 11:59 PM. If you have any questions, ask your nurse or doctor.          baclofen 10 MG tablet Commonly known as: LIORESAL Take 10 mg by mouth 3 (three) times daily.   celecoxib 200 MG capsule Commonly  known as: CELEBREX Take 200 mg by mouth daily.   citalopram 10 MG tablet Commonly known as: CELEXA Take 10 mg by mouth daily.   clonazePAM 1 MG tablet Commonly known as: KLONOPIN Take 1 mg by mouth daily.   diphenhydrAMINE 25 MG tablet Commonly known as: BENADRYL Take 25 mg by mouth every 6 (six) hours as needed (take with promethazine for migraines).   DULoxetine 60 MG capsule Commonly known as: CYMBALTA Take 60 mg by mouth at bedtime.   DULoxetine 30 MG capsule Commonly known as: CYMBALTA Take 30 mg by mouth daily.   ezetimibe 10 MG tablet Commonly known as: ZETIA Take 10 mg by mouth daily.   gabapentin 800 MG tablet Commonly known as: NEURONTIN Take 1,600 tablets by mouth 2 (two) times daily.   GAS-X PO Take 1-2 tablets by mouth 3 (three) times daily as needed (gas).   lamoTRIgine 200 MG tablet Commonly known as: LAMICTAL Take 200 mg by mouth every evening.   methenamine 1 g tablet Commonly known as: MANDELAMINE Take 1 tablet (1,000 mg total) by mouth 2 (two) times daily.   naratriptan 2.5 MG tablet Commonly known as: AMERGE Take 2.5 mg by mouth as needed for migraine. Take one (1) tablet at onset of headache; if returns or does not resolve, may repeat after 4 hours; do not exceed five (5) mg in 24 hours.   omeprazole 40 MG capsule Commonly known as: PRILOSEC Take 40 mg by mouth 2 (two) times daily.   OVER THE COUNTER MEDICATION Take 1 tablet by mouth daily. Bariatric Pal otc vitamin   oxyCODONE-acetaminophen 5-325 MG tablet Commonly known as: Percocet Take 1 tablet by mouth every 4 (four) hours as needed.   promethazine 25 MG tablet Commonly known as: PHENERGAN Take 1 tablet (25 mg total) by mouth 4 (four) times daily as needed for nausea or vomiting.   QUEtiapine 25 MG tablet Commonly known as: SEROQUEL Take 50 mg by mouth at bedtime.   rizatriptan 10 MG tablet Commonly known as: MAXALT Take 10 mg by mouth as needed for migraine.    rosuvastatin 5 MG tablet Commonly known as: CRESTOR Take 5 mg by mouth daily.   sucralfate 1 g tablet Commonly known as: Carafate Take 1 tablet (1 g total) by mouth 4 (four) times daily.   tiZANidine 2 MG tablet Commonly known as: ZANAFLEX Take 2 mg by mouth 3 (three) times daily.   Toviaz 4 MG Tb24 tablet Generic drug: fesoterodine TAKE 1 TABLET(4 MG) BY MOUTH DAILY   Ubrelvy 100 MG Tabs Generic drug: Ubrogepant Take 100 mg by mouth daily as needed (migraines).        Allergies:  Allergies  Allergen Reactions   Penicillins Swelling and Rash    Rash all over [place] and lip swelling. Has patient had a  PCN reaction causing immediate rash, facial/tongue/throat swelling, SOB or lightheadedness with hypotension: Yes Has patient had a PCN reaction causing severe rash involving mucus membranes or skin necrosis: Yes Has patient had a PCN reaction that required hospitalization: No If all of the above answers are "NO", then may proceed with Cephalosporin use.   Nortriptyline Other (See Comments)    Severe chest pain     Nsaids Other (See Comments)    Advised not to take due to gastric sleeve    Amoxicillin Rash    Rash all over [place]    Ciprofloxacin Diarrhea and Nausea Only    Family History: Family History  Problem Relation Age of Onset   Hypertension Mother    Melanoma Mother    Breast cancer Mother 62   Cancer - Other Father        liver   Diabetes Mellitus II Maternal Grandmother    Heart disease Maternal Grandmother    Breast cancer Maternal Grandmother    Cancer - Lung Maternal Grandfather    Osteoporosis Paternal Grandmother    Melanoma Paternal Grandfather     Social History:  reports that she has never smoked. She has never used smokeless tobacco. She reports that she does not drink alcohol and does not use drugs.  ROS: All other review of systems were reviewed and are negative except what is noted above in HPI  Physical Exam: BP 112/75   Pulse 97    LMP 05/04/2015 (Approximate)   Constitutional:  Alert and oriented, No acute distress. HEENT:  AT, moist mucus membranes.  Trachea midline, no masses. Cardiovascular: No clubbing, cyanosis, or edema. Respiratory: Normal respiratory effort, no increased work of breathing. GI: Abdomen is soft, nontender, nondistended, no abdominal masses GU: No CVA tenderness.  Lymph: No cervical or inguinal lymphadenopathy. Skin: No rashes, bruises or suspicious lesions. Neurologic: Grossly intact, no focal deficits, moving all 4 extremities. Psychiatric: Normal mood and affect.  Laboratory Data: Lab Results  Component Value Date   WBC 11.0 (H) 07/09/2021   HGB 11.6 (L) 07/09/2021   HCT 35.1 (L) 07/09/2021   MCV 94.1 07/09/2021   PLT 167 07/09/2021    Lab Results  Component Value Date   CREATININE 0.56 07/09/2021    No results found for: PSA  No results found for: TESTOSTERONE  No results found for: HGBA1C  Urinalysis    Component Value Date/Time   COLORURINE YELLOW (A) 06/21/2017 1735   APPEARANCEUR Clear 06/28/2021 1411   LABSPEC 1.019 06/21/2017 1735   PHURINE 5.0 06/21/2017 1735   GLUCOSEU Negative 06/28/2021 1411   HGBUR NEGATIVE 06/21/2017 1735   BILIRUBINUR Negative 06/28/2021 1411   KETONESUR NEGATIVE 06/21/2017 1735   PROTEINUR 2+ (A) 06/28/2021 1411   PROTEINUR NEGATIVE 06/21/2017 1735   UROBILINOGEN 0.2 01/27/2020 1124   UROBILINOGEN 1.0 05/18/2015 1359   NITRITE Negative 06/28/2021 1411   NITRITE NEGATIVE 06/21/2017 1735   LEUKOCYTESUR Trace (A) 06/28/2021 1411    Lab Results  Component Value Date   LABMICR See below: 06/28/2021   WBCUA 0-5 06/28/2021   LABEPIT 0-10 06/28/2021   BACTERIA Few (A) 06/28/2021    Pertinent Imaging: IR right nephrostomy tube placement: Images reviewed and discussed with the patient Results for orders placed during the hospital encounter of 11/27/15  DG Abd 1 View  Narrative CLINICAL DATA:  Constipation, Sitz mark study ;  markers ingested 5 days ago, left upper quadrant and left lower quadrant abdominal pain.  EXAM: ABDOMEN - 1 VIEW  COMPARISON:  KUB images of November 25, 2015  FINDINGS: There is a single Sitz marker that projects over the distal transverse colon. There are 3 Sitz markers that project in the pelvis likely in the sigmoid. The stool burden within the ascending and transverse colon is moderate. The descending and rectosigmoid colonic stool burden is relatively small. There is no small bowel obstruction. The bony structures are unremarkable.  IMPRESSION: There has been further progression of the Sitz markers. The colonic stool burden in the right and transverse colon is moderate.   Electronically Signed By: David  Martinique M.D. On: 11/27/2015 14:10  No results found for this or any previous visit.  No results found for this or any previous visit.  No results found for this or any previous visit.  Results for orders placed during the hospital encounter of 01/01/18  US RENAL  Narrative CLINICAL DATA:  Pelvic pain.  EXAM: RENAL / URINARY TRACT ULTRASOUND COMPLETE  COMPARISON:  None.  FINDINGS: Right Kidney:  Length: 12.1 cm. Echogenicity within normal limits. No mass or hydronephrosis visualized.  Left Kidney:  Length: 12.5 cm. Echogenicity within normal limits. No mass or hydronephrosis visualized.  Bladder:  Appears normal for degree of bladder distention. BILATERAL ureteral jets.  IMPRESSION: Negative.   Electronically Signed By: Staci Righter M.D. On: 01/01/2018 13:49  No results found for this or any previous visit.  No results found for this or any previous visit.  Results for orders placed during the hospital encounter of 01/01/20  CT RENAL STONE STUDY  Narrative CLINICAL DATA:  Right flank pain radiating to right groin. Hematuria. Urinary frequency. History of kidney stones.  EXAM: CT ABDOMEN AND PELVIS WITHOUT  CONTRAST  TECHNIQUE: Multidetector CT imaging of the abdomen and pelvis was performed following the standard protocol without IV contrast.  COMPARISON:  08/21/2015  FINDINGS: Lower chest: Lung bases are clear.  Hepatobiliary: Unenhanced liver is unremarkable.  Gallbladder is unremarkable. No intrahepatic or extrahepatic ductal dilatation.  Pancreas: Within normal limits.  Spleen: Within normal limits.  Adrenals/Urinary Tract: Adrenal glands are within normal limits.  Suspected 14 mm cyst in the posterior right upper kidney (series 2/image 28), poorly evaluated. 3 mm nonobstructing right lower pole renal calculus (series 2/image 40). Left kidney is within normal limits.  No ureteral or bladder calculi.  No hydronephrosis.  Bladder is within normal limits.  Stomach/Bowel: Postsurgical changes involving the stomach.  Status post subtotal colectomy with ileorectal anastomosis.  No evidence of bowel obstruction.  Appendix is surgically absent.  Vascular/Lymphatic: No evidence of abdominal aortic aneurysm.  11 mm short axis node in the right mid abdomen, likely reflecting an ileocolic node (series 2/image 85), borderline enlarged.  Reproductive: Status post hysterectomy.  No adnexal masses.  Other: No abdominopelvic ascites.  Musculoskeletal: Visualized osseous structures are within normal limits.  IMPRESSION: 3 mm nonobstructing right lower pole renal calculus. No ureteral or bladder calculi. No hydronephrosis.  Status post subtotal colectomy with ileorectal anastomosis. Additional postsurgical changes as above.  11 mm short axis ileocolic node in the right mid abdomen. In the absence of known malignancy, this is likely reactive given adjacent postsurgical changes. In the setting of malignancy, consider follow-up in 3-6 months.   Electronically Signed By: Julian Hy M.D. On: 01/01/2020 21:17   Assessment & Plan:    1. Kidney stones -Dressing  exchanged with gauze and paper tape. We will proceed with right percutaneous nephrostolithotomy on 10/6   No follow-ups on file.  Nicolette Bang, MD  [facility]

## 2021-07-20 NOTE — Discharge Summary (Signed)
Physician Discharge Summary  Patient ID: Michaila Kenney MRN: [id number] DOB/AGE: 44-Sep-1977 44 y.o.  Admit date: 07/08/2021 Discharge date: 07/10/2021  Admission Diagnoses:  Renal calculus  Discharge Diagnoses:  Active Problems:   Renal calculus   Past Medical History:  Diagnosis Date   Abnormal Pap smear of cervix 12/2014   Acid reflux    Adrenal insufficiency (HCC)    Anxiety    Arthritis    Arthritis    Depression    Depression    Endometriosis    Fibroids    age 43   High cholesterol    History of kidney stones    Liver disease    per pt report, will have biopsy Nov 08, 2017   Migraines    Pleurisy 02/2015   while hospitalized    Seizure ([place])    on meds and no seizures in 2-3 years. Unknown etiology   Skin cancer (melanoma) ([place])    Syncope and collapse    Uterine cancer ([place])     Surgeries: Procedure(s): NEPHROLITHOTOMY PERCUTANEOUS- with stent placement on 07/08/2021   Consultants (if any):   Discharged Condition: Improved  Hospital Course: Yasmin Bronaugh is an 45 y.o. female who was admitted 07/08/2021 with a diagnosis of <principal problem not specified> and went to the operating room on 07/08/2021 and underwent the above named procedures.    She was given perioperative antibiotics:  Anti-infectives (From admission, onward)    Start     Dose/Rate Route Frequency Ordered Stop   07/08/21 1345  methenamine (MANDELAMINE) tablet 1,000 mg  Status:  Discontinued        1,000 mg Oral 2 times daily 07/08/21 1339 07/10/21 1853   07/08/21 0655  gentamicin (GARAMYCIN) 420 mg in dextrose 5 % 100 mL IVPB        5 mg/kg  83.2 kg (Adjusted) 110.5 mL/hr over 60 Minutes Intravenous 30 min pre-op 07/08/21 0655 07/08/21 0923     .  She was given sequential compression devices, early ambulation for DVT prophylaxis.  She benefited maximally from the hospital stay and there were no complications.    Recent vital signs:  Vitals:   07/09/21 2136 07/10/21 0532   BP: (!) 101/59 108/75  Pulse: 78 74  Resp: 20 16  Temp: 98 F (36.7 C) 98.2 F (36.8 C)  SpO2: 99% 98%    Recent laboratory studies:  Lab Results  Component Value Date   HGB 11.6 (L) 07/09/2021   HGB 13.7 07/08/2021   HGB 12.8 07/05/2021   Lab Results  Component Value Date   WBC 11.0 (H) 07/09/2021   PLT 167 07/09/2021   Lab Results  Component Value Date   INR 0.9 07/05/2021   Lab Results  Component Value Date   NA 140 07/09/2021   K 4.1 07/09/2021   CL 107 07/09/2021   CO2 26 07/09/2021   BUN 16 07/09/2021   CREATININE 0.56 07/09/2021   GLUCOSE 120 (H) 07/09/2021    Discharge Medications:   Allergies as of 07/10/2021       Reactions   Penicillins Swelling, Rash   Rash all over [place] and lip swelling. Has patient had a PCN reaction causing immediate rash, facial/tongue/throat swelling, SOB or lightheadedness with hypotension: Yes Has patient had a PCN reaction causing severe rash involving mucus membranes or skin necrosis: Yes Has patient had a PCN reaction that required hospitalization: No If all of the above answers are "NO", then may proceed with Cephalosporin use.   Nortriptyline Other (  See Comments)   Severe chest pain    Nsaids Other (See Comments)   Advised not to take due to gastric sleeve    Amoxicillin Rash   Rash all over [place]   Ciprofloxacin Diarrhea, Nausea Only        Medication List     STOP taking these medications    oxyCODONE-acetaminophen 5-325 MG tablet Commonly known as: Percocet       TAKE these medications    baclofen 10 MG tablet Commonly known as: LIORESAL Take 10 mg by mouth 3 (three) times daily.   celecoxib 200 MG capsule Commonly known as: CELEBREX Take 200 mg by mouth daily.   citalopram 10 MG tablet Commonly known as: CELEXA Take 10 mg by mouth daily.   clonazePAM 1 MG tablet Commonly known as: KLONOPIN Take 1 mg by mouth daily.   diphenhydrAMINE 25 MG tablet Commonly known as: BENADRYL Take 25 mg  by mouth every 6 (six) hours as needed (take with promethazine for migraines).   DULoxetine 60 MG capsule Commonly known as: CYMBALTA Take 60 mg by mouth at bedtime.   DULoxetine 30 MG capsule Commonly known as: CYMBALTA Take 30 mg by mouth daily.   ezetimibe 10 MG tablet Commonly known as: ZETIA Take 10 mg by mouth daily.   gabapentin 800 MG tablet Commonly known as: NEURONTIN Take 1,600 tablets by mouth 2 (two) times daily.   GAS-X PO Take 1-2 tablets by mouth 3 (three) times daily as needed (gas).   lamoTRIgine 200 MG tablet Commonly known as: LAMICTAL Take 200 mg by mouth every evening.   methenamine 1 g tablet Commonly known as: MANDELAMINE Take 1 tablet (1,000 mg total) by mouth 2 (two) times daily.   naratriptan 2.5 MG tablet Commonly known as: AMERGE Take 2.5 mg by mouth as needed for migraine. Take one (1) tablet at onset of headache; if returns or does not resolve, may repeat after 4 hours; do not exceed five (5) mg in 24 hours.   omeprazole 40 MG capsule Commonly known as: PRILOSEC Take 40 mg by mouth 2 (two) times daily.   OVER THE COUNTER MEDICATION Take 1 tablet by mouth daily. Bariatric Pal otc vitamin   promethazine 25 MG tablet Commonly known as: PHENERGAN Take 1 tablet (25 mg total) by mouth 4 (four) times daily as needed for nausea or vomiting.   QUEtiapine 25 MG tablet Commonly known as: SEROQUEL Take 50 mg by mouth at bedtime.   rizatriptan 10 MG tablet Commonly known as: MAXALT Take 10 mg by mouth as needed for migraine.   rosuvastatin 5 MG tablet Commonly known as: CRESTOR Take 5 mg by mouth daily.   sucralfate 1 g tablet Commonly known as: Carafate Take 1 tablet (1 g total) by mouth 4 (four) times daily.   tiZANidine 2 MG tablet Commonly known as: ZANAFLEX Take 2 mg by mouth 3 (three) times daily.   Toviaz 4 MG Tb24 tablet Generic drug: fesoterodine TAKE 1 TABLET(4 MG) BY MOUTH DAILY   Ubrelvy 100 MG Tabs Generic drug:  Ubrogepant Take 100 mg by mouth daily as needed (migraines).        Diagnostic Studies: DG C-Arm 1-60 Min-No Report  Result Date: 07/08/2021 Fluoroscopy was utilized by the requesting physician.  No radiographic interpretation.   IR NEPHROSTOMY PLACEMENT RIGHT  Result Date: 07/05/2021 INDICATION: 45 year old female with history of right nephrolithiasis. Presents as outpatient for nephrostomy tube placement prior to right PCNL. EXAM: 1. ULTRASOUND GUIDANCE FOR PUNCTURE OF  THE RIGHT RENAL COLLECTING SYSTEM 2. RIGHT PERCUTANEOUS NEPHROSTOMY TUBE PLACEMENT. COMPARISON:  None. MEDICATIONS: Vancomycin 1 gm IV; The antibiotic was administered in an appropriate time frame prior to skin puncture. ANESTHESIA/SEDATION: Moderate (conscious) sedation was employed during this procedure. A total of Versed 4 mg and Fentanyl 200 mcg was administered intravenously. Moderate Sedation Time: 32 minutes. The patient's level of consciousness and vital signs were monitored continuously by radiology nursing throughout the procedure under my direct supervision. CONTRAST:  Twenty mL Omnipaque 350-administered into the renal collecting system FLUOROSCOPY TIME:  One minutes 42 seconds.  Seven mGy. COMPLICATIONS: None immediate. PROCEDURE: The procedure, risks, benefits, and alternatives were explained to the patient. Questions regarding the procedure were encouraged and answered. The patient understands and consents to the procedure. A timeout was performed prior to the initiation of the procedure. The right flank region was prepped and draped in the usual sterile fashion and a sterile drape was applied covering the operative field. A sterile gown and sterile gloves were used for the procedure. Local anesthesia was provided with 1% Lidocaine with epinephrine. Ultrasound was used to localize the right kidney. Under direct ultrasound guidance, a 20 gauge needle was advanced into the renal collecting system. An ultrasound image  documentation was performed. Access within the collecting system was confirmed with the efflux of urine followed by limited contrast injection. Over a Nitrex wire, the tract was dilated with an Accustick stent. Next, under intermittent fluoroscpic guidance and over a short Amplatz wire, the track was dilated ultimately allowing placement of a 8.5 French Bettey Mare percutaneous nephrostomy catheter which was advanced to the level of the renal pelvis where the coil was formed and locked. Contrast was injected and several spot fluoroscopic images were obtained in various obliquities. The catheter was secured at the skin with a 0 silk retention suture and stat lock device and connected to a gravity bag was placed. Dressings were applied. The patient tolerated procedure well without immediate postprocedural complication. FINDINGS: Ultrasound scanning demonstrates a moderate dilated right collecting system with large shadowing gallstone in the renal pelvis. Under a combination of ultrasound and fluoroscopic guidance, a posterior midpole calix was targeted allowing placement of a 8.5 French Dawson Mueller percutaneous nephrostomy catheter with end coiled and locked within the renal pelvis. Contrast injection confirmed appropriate positioning. IMPRESSION: Successful ultrasound and fluoroscopic guided placement of a right sided 8.5 [place] PCN. Ruthann Cancer, MD [facility] Electronically Signed   By: Ruthann Cancer M.D.   On: 07/05/2021 11:13    Disposition: Discharge disposition: 01-Home or Self Care       Discharge Instructions     Discharge patient   Complete by: As directed    Discharge disposition: 01-Home or Self Care   Discharge patient date: 07/09/2021   Discontinue IV   Complete by: As directed         Follow-up Information     Araya Roel, Candee Furbish, MD. Call in 1 week(s).   Specialty: Urology Contact information: [address]  [place] [address] [phone]                  Signed: Nicolette Bang 07/20/2021, 9:36 AM

## 2021-07-20 NOTE — Patient Instructions (Signed)
Dietary Guidelines to Help Prevent Kidney Stones Kidney stones are deposits of minerals and salts that form inside your kidneys. Your risk of developing kidney stones may be greater depending on your diet, your lifestyle, the medicines you take, and whether you have certain medical conditions. Most people can lower their chances of developing kidney stones by following the instructions below. Your dietitian may give you more specific instructions depending on your overall health and the type of kidney stones you tend to develop. What are tips for following this plan? Reading food labels  Choose foods with "no salt added" or "low-salt" labels. Limit your salt (sodium) intake to less than 1,500 mg a day. Choose foods with calcium for each meal and snack. Try to eat about 300 mg of calcium at each meal. Foods that contain 200-500 mg of calcium a serving include: 8 oz (237 mL) of milk, calcium-fortifiednon-dairy milk, and calcium-fortifiedfruit juice. Calcium-fortified means that calcium has been added to these drinks. 8 oz (237 mL) of kefir, yogurt, and soy yogurt. 4 oz (114 g) of tofu. 1 oz (28 g) of cheese. 1 cup (150 g) of dried figs. 1 cup (91 g) of cooked broccoli. One 3 oz (85 g) can of sardines or mackerel. Most people need 1,000-1,500 mg of calcium a day. Talk to your dietitian about how much calcium is recommended for you. Shopping Buy plenty of fresh fruits and vegetables. Most people do not need to avoid fruits and vegetables, even if these foods contain nutrients that may contribute to kidney stones. When shopping for convenience foods, choose: Whole pieces of fruit. Pre-made salads with dressing on the side. Low-fat fruit and yogurt smoothies. Avoid buying frozen meals or prepared deli foods. These can be high in sodium. Look for foods with live cultures, such as yogurt and kefir. Choose high-fiber grains, such as whole-wheat breads, oat bran, and wheat cereals. Cooking Do not add  salt to food when cooking. Place a salt shaker on the table and allow each person to add his or her own salt to taste. Use vegetable protein, such as beans, textured vegetable protein (TVP), or tofu, instead of meat in pasta, casseroles, and soups. Meal planning Eat less salt, if told by your dietitian. To do this: Avoid eating processed or pre-made food. Avoid eating fast food. Eat less animal protein, including cheese, meat, poultry, or fish, if told by your dietitian. To do this: Limit the number of times you have meat, poultry, fish, or cheese each week. Eat a diet free of meat at least 2 days a week. Eat only one serving each day of meat, poultry, fish, or seafood. When you prepare animal protein, cut pieces into small portion sizes. For most meat and fish, one serving is about the size of the palm of your hand. Eat at least five servings of fresh fruits and vegetables each day. To do this: Keep fruits and vegetables on hand for snacks. Eat one piece of fruit or a handful of berries with breakfast. Have a salad and fruit at lunch. Have two kinds of vegetables at dinner. Limit foods that are high in a substance called oxalate. These include: Spinach (cooked), rhubarb, beets, sweet potatoes, and Swiss chard. Peanuts. Potato chips, french fries, and baked potatoes with skin on. Nuts and nut products. Chocolate. If you regularly take a diuretic medicine, make sure to eat at least 1 or 2 servings of fruits or vegetables that are high in potassium each day. These include: Avocado. Banana. Orange, prune,   carrot, or tomato juice. Baked potato. Cabbage. Beans and split peas. Lifestyle  Drink enough fluid to keep your urine pale yellow. This is the most important thing you can do. Spread your fluid intake throughout the day. If you drink alcohol: Limit how much you use to: 0-1 drink a day for women who are not pregnant. 0-2 drinks a day for men. Be aware of how much alcohol is in your  drink. In the [place] one drink equals one 12 oz bottle of beer (355 mL), one 5 oz glass of wine (148 mL), or one 1 oz glass of hard liquor (44 mL). Lose weight if told by your health care provider. Work with your dietitian to find an eating plan and weight loss strategies that work best for you. General information Talk to your health care provider and dietitian about taking daily supplements. You may be told the following depending on your health and the cause of your kidney stones: Not to take supplements with vitamin C. To take a calcium supplement. To take a daily probiotic supplement. To take other supplements such as magnesium, fish oil, or vitamin B6. Take over-the-counter and prescription medicines only as told by your health care provider. These include supplements. What foods should I limit? Limit your intake of the following foods, or eat them as told by your dietitian. Vegetables Spinach. Rhubarb. Beets. Canned vegetables. Pickles. Olives. Baked potatoes with skin. Grains Wheat bran. Baked goods. Salted crackers. Cereals high in sugar. Meats and other proteins Nuts. Nut butters. Large portions of meat, poultry, or fish. Salted, precooked, or cured meats, such as sausages, meat loaves, and hot dogs. Dairy Cheese. Beverages Regular soft drinks. Regular vegetable juice. Seasonings and condiments Seasoning blends with salt. Salad dressings. Soy sauce. Ketchup. Barbecue sauce. Other foods Canned soups. Canned pasta sauce. Casseroles. Pizza. Lasagna. Frozen meals. Potato chips. French fries. The items listed above may not be a complete list of foods and beverages you should limit. Contact a dietitian for more information. What foods should I avoid? Talk to your dietitian about specific foods you should avoid based on the type of kidney stones you have and your overall health. Fruits Grapefruit. The item listed above may not be a complete list of foods and beverages you should  avoid. Contact a dietitian for more information. Summary Kidney stones are deposits of minerals and salts that form inside your kidneys. You can lower your risk of kidney stones by making changes to your diet. The most important thing you can do is drink enough fluid. Drink enough fluid to keep your urine pale yellow. Talk to your dietitian about how much calcium you should have each day, and eat less salt and animal protein as told by your dietitian. This information is not intended to replace advice given to you by your health care provider. Make sure you discuss any questions you have with your health care provider. Document Revised: 09/12/2019 Document Reviewed: 09/12/2019 Elsevier Patient Education  2022 Elsevier Inc.  

## 2021-07-21 ENCOUNTER — Other Ambulatory Visit: Payer: Self-pay

## 2021-07-21 ENCOUNTER — Encounter: Payer: Self-pay | Admitting: Urology

## 2021-07-21 ENCOUNTER — Ambulatory Visit: Payer: Medicare HMO | Admitting: Urology

## 2021-07-21 VITALS — BP 127/80 | HR 112

## 2021-07-21 DIAGNOSIS — N200 Calculus of kidney: Secondary | ICD-10-CM

## 2021-07-21 MED ORDER — SULFAMETHOXAZOLE-TRIMETHOPRIM 800-160 MG PO TABS: 1.0000 | ORAL_TABLET | Freq: Once | ORAL | 0 refills | Status: AC

## 2021-07-21 NOTE — Progress Notes (Signed)
   07/21/21  CC: followup nephrolithiasis  HPI: Denise Avila is a 45yo here for right ureteral stent removal Blood pressure 127/80, pulse (!) 112, last menstrual period 05/04/2015. NED. A&Ox3.   No respiratory distress   Abd soft, NT, ND Normal external genitalia with patent urethral meatus  Cystoscopy Procedure Note  Patient identification was confirmed, informed consent was obtained, and patient was prepped using Betadine solution.  Lidocaine jelly was administered per urethral meatus.    Procedure: - Flexible cystoscope introduced, without any difficulty.   - Thorough search of the bladder revealed:    normal urethral meatus    normal urothelium    no stones    no ulcers     no tumors    no urethral polyps    no trabeculation Using a grasper the right ureteral stent was removed intact - Ureteral orifices were normal in position and appearance.  Post-Procedure: - Patient tolerated the procedure well  Assessment/ Plan:    Return in about 6 weeks (around 09/01/2021) for renal US.  Nicolette Bang, MD

## 2021-07-21 NOTE — Patient Instructions (Signed)
Dietary Guidelines to Help Prevent Kidney Stones Kidney stones are deposits of minerals and salts that form inside your kidneys. Your risk of developing kidney stones may be greater depending on your diet, your lifestyle, the medicines you take, and whether you have certain medical conditions. Most people can lower their chances of developing kidney stones by following the instructions below. Your dietitian may give you more specific instructions depending on your overall health and the type of kidney stones you tend to develop. What are tips for following this plan? Reading food labels  Choose foods with "no salt added" or "low-salt" labels. Limit your salt (sodium) intake to less than 1,500 mg a day. Choose foods with calcium for each meal and snack. Try to eat about 300 mg of calcium at each meal. Foods that contain 200-500 mg of calcium a serving include: 8 oz (237 mL) of milk, calcium-fortifiednon-dairy milk, and calcium-fortifiedfruit juice. Calcium-fortified means that calcium has been added to these drinks. 8 oz (237 mL) of kefir, yogurt, and soy yogurt. 4 oz (114 g) of tofu. 1 oz (28 g) of cheese. 1 cup (150 g) of dried figs. 1 cup (91 g) of cooked broccoli. One 3 oz (85 g) can of sardines or mackerel. Most people need 1,000-1,500 mg of calcium a day. Talk to your dietitian about how much calcium is recommended for you. Shopping Buy plenty of fresh fruits and vegetables. Most people do not need to avoid fruits and vegetables, even if these foods contain nutrients that may contribute to kidney stones. When shopping for convenience foods, choose: Whole pieces of fruit. Pre-made salads with dressing on the side. Low-fat fruit and yogurt smoothies. Avoid buying frozen meals or prepared deli foods. These can be high in sodium. Look for foods with live cultures, such as yogurt and kefir. Choose high-fiber grains, such as whole-wheat breads, oat bran, and wheat cereals. Cooking Do not add  salt to food when cooking. Place a salt shaker on the table and allow each person to add his or her own salt to taste. Use vegetable protein, such as beans, textured vegetable protein (TVP), or tofu, instead of meat in pasta, casseroles, and soups. Meal planning Eat less salt, if told by your dietitian. To do this: Avoid eating processed or pre-made food. Avoid eating fast food. Eat less animal protein, including cheese, meat, poultry, or fish, if told by your dietitian. To do this: Limit the number of times you have meat, poultry, fish, or cheese each week. Eat a diet free of meat at least 2 days a week. Eat only one serving each day of meat, poultry, fish, or seafood. When you prepare animal protein, cut pieces into small portion sizes. For most meat and fish, one serving is about the size of the palm of your hand. Eat at least five servings of fresh fruits and vegetables each day. To do this: Keep fruits and vegetables on hand for snacks. Eat one piece of fruit or a handful of berries with breakfast. Have a salad and fruit at lunch. Have two kinds of vegetables at dinner. Limit foods that are high in a substance called oxalate. These include: Spinach (cooked), rhubarb, beets, sweet potatoes, and Swiss chard. Peanuts. Potato chips, french fries, and baked potatoes with skin on. Nuts and nut products. Chocolate. If you regularly take a diuretic medicine, make sure to eat at least 1 or 2 servings of fruits or vegetables that are high in potassium each day. These include: Avocado. Banana. Orange, prune,   carrot, or tomato juice. Baked potato. Cabbage. Beans and split peas. Lifestyle  Drink enough fluid to keep your urine pale yellow. This is the most important thing you can do. Spread your fluid intake throughout the day. If you drink alcohol: Limit how much you use to: 0-1 drink a day for women who are not pregnant. 0-2 drinks a day for men. Be aware of how much alcohol is in your  drink. In the [place] one drink equals one 12 oz bottle of beer (355 mL), one 5 oz glass of wine (148 mL), or one 1 oz glass of hard liquor (44 mL). Lose weight if told by your health care provider. Work with your dietitian to find an eating plan and weight loss strategies that work best for you. General information Talk to your health care provider and dietitian about taking daily supplements. You may be told the following depending on your health and the cause of your kidney stones: Not to take supplements with vitamin C. To take a calcium supplement. To take a daily probiotic supplement. To take other supplements such as magnesium, fish oil, or vitamin B6. Take over-the-counter and prescription medicines only as told by your health care provider. These include supplements. What foods should I limit? Limit your intake of the following foods, or eat them as told by your dietitian. Vegetables Spinach. Rhubarb. Beets. Canned vegetables. Pickles. Olives. Baked potatoes with skin. Grains Wheat bran. Baked goods. Salted crackers. Cereals high in sugar. Meats and other proteins Nuts. Nut butters. Large portions of meat, poultry, or fish. Salted, precooked, or cured meats, such as sausages, meat loaves, and hot dogs. Dairy Cheese. Beverages Regular soft drinks. Regular vegetable juice. Seasonings and condiments Seasoning blends with salt. Salad dressings. Soy sauce. Ketchup. Barbecue sauce. Other foods Canned soups. Canned pasta sauce. Casseroles. Pizza. Lasagna. Frozen meals. Potato chips. French fries. The items listed above may not be a complete list of foods and beverages you should limit. Contact a dietitian for more information. What foods should I avoid? Talk to your dietitian about specific foods you should avoid based on the type of kidney stones you have and your overall health. Fruits Grapefruit. The item listed above may not be a complete list of foods and beverages you should  avoid. Contact a dietitian for more information. Summary Kidney stones are deposits of minerals and salts that form inside your kidneys. You can lower your risk of kidney stones by making changes to your diet. The most important thing you can do is drink enough fluid. Drink enough fluid to keep your urine pale yellow. Talk to your dietitian about how much calcium you should have each day, and eat less salt and animal protein as told by your dietitian. This information is not intended to replace advice given to you by your health care provider. Make sure you discuss any questions you have with your health care provider. Document Revised: 09/12/2019 Document Reviewed: 09/12/2019 Elsevier Patient Education  2022 Elsevier Inc.  

## 2021-07-22 LAB — BASIC METABOLIC PANEL
BUN/Creatinine Ratio: 18 (ref 9–23)
BUN: 15 mg/dL (ref 6–24)
CO2: 16 mmol/L — ABNORMAL LOW (ref 20–29)
Calcium: 9.4 mg/dL (ref 8.7–10.2)
Chloride: 103 mmol/L (ref 96–106)
Creatinine, Ser: 0.84 mg/dL (ref 0.57–1.00)
Glucose: 125 mg/dL — ABNORMAL HIGH (ref 70–99)
Potassium: 4.6 mmol/L (ref 3.5–5.2)
Sodium: 140 mmol/L (ref 134–144)
eGFR: 88 mL/min/{1.73_m2} (ref >59)

## 2021-07-22 LAB — URIC ACID: Uric Acid: 6.4 mg/dL — ABNORMAL HIGH (ref 2.6–6.2)

## 2021-07-22 LAB — PTH, INTACT AND CALCIUM: PTH: 22 pg/mL (ref 15–65)

## 2021-07-27 DIAGNOSIS — F5104 Psychophysiologic insomnia: Secondary | ICD-10-CM | POA: Diagnosis not present

## 2021-07-28 ENCOUNTER — Telehealth: Payer: Self-pay

## 2021-07-28 DIAGNOSIS — R350 Frequency of micturition: Secondary | ICD-10-CM

## 2021-07-28 MED ORDER — MIRABEGRON ER 25 MG PO TB24: 25.0000 mg | ORAL_TABLET | Freq: Every day | ORAL | 3 refills | Status: DC

## 2021-07-28 NOTE — Telephone Encounter (Signed)
Patient called the office to ask for rx for myrbetriq. Reviewed with Dr. Alyson Ingles, New order submitted to pharmacy for patient.

## 2021-07-29 DIAGNOSIS — F339 Major depressive disorder, recurrent, unspecified: Secondary | ICD-10-CM | POA: Diagnosis not present

## 2021-07-29 DIAGNOSIS — R7309 Other abnormal glucose: Secondary | ICD-10-CM | POA: Diagnosis not present

## 2021-07-29 DIAGNOSIS — E7800 Pure hypercholesterolemia, unspecified: Secondary | ICD-10-CM | POA: Diagnosis not present

## 2021-07-29 DIAGNOSIS — G8929 Other chronic pain: Secondary | ICD-10-CM | POA: Diagnosis not present

## 2021-07-29 DIAGNOSIS — Z0000 Encounter for general adult medical examination without abnormal findings: Secondary | ICD-10-CM | POA: Diagnosis not present

## 2021-07-29 DIAGNOSIS — G35 Multiple sclerosis: Secondary | ICD-10-CM | POA: Diagnosis not present

## 2021-07-29 DIAGNOSIS — Z23 Encounter for immunization: Secondary | ICD-10-CM | POA: Diagnosis not present

## 2021-07-29 DIAGNOSIS — K760 Fatty (change of) liver, not elsewhere classified: Secondary | ICD-10-CM | POA: Diagnosis not present

## 2021-07-29 DIAGNOSIS — L821 Other seborrheic keratosis: Secondary | ICD-10-CM | POA: Diagnosis not present

## 2021-08-05 DIAGNOSIS — F5104 Psychophysiologic insomnia: Secondary | ICD-10-CM | POA: Diagnosis not present

## 2021-08-30 ENCOUNTER — Ambulatory Visit (HOSPITAL_COMMUNITY)
Admission: RE | Admit: 2021-08-30 | Discharge: 2021-08-30 | Disposition: A | Discharge status: Home / Self Care | Payer: Medicare HMO | Source: Ambulatory Visit | Attending: Urology | Admitting: Urology

## 2021-08-30 ENCOUNTER — Other Ambulatory Visit: Payer: Self-pay

## 2021-08-30 DIAGNOSIS — N200 Calculus of kidney: Secondary | ICD-10-CM | POA: Insufficient documentation

## 2021-08-31 ENCOUNTER — Other Ambulatory Visit: Payer: Self-pay | Admitting: Physician Assistant

## 2021-08-31 ENCOUNTER — Ambulatory Visit
Admission: RE | Admit: 2021-08-31 | Discharge: 2021-08-31 | Disposition: A | Discharge status: Home / Self Care | Payer: Medicare HMO | Source: Ambulatory Visit | Attending: Physician Assistant | Admitting: Physician Assistant

## 2021-08-31 DIAGNOSIS — M79671 Pain in right foot: Secondary | ICD-10-CM

## 2021-09-03 ENCOUNTER — Encounter: Payer: Self-pay | Admitting: Urology

## 2021-09-03 ENCOUNTER — Ambulatory Visit: Payer: Medicare HMO | Admitting: Urology

## 2021-09-03 ENCOUNTER — Other Ambulatory Visit: Payer: Self-pay

## 2021-09-03 VITALS — BP 117/78 | HR 93

## 2021-09-03 DIAGNOSIS — N200 Calculus of kidney: Secondary | ICD-10-CM | POA: Diagnosis not present

## 2021-09-03 DIAGNOSIS — N3021 Other chronic cystitis with hematuria: Secondary | ICD-10-CM

## 2021-09-03 DIAGNOSIS — R300 Dysuria: Secondary | ICD-10-CM

## 2021-09-03 DIAGNOSIS — R350 Frequency of micturition: Secondary | ICD-10-CM | POA: Diagnosis not present

## 2021-09-03 LAB — URINALYSIS, ROUTINE W REFLEX MICROSCOPIC
Bilirubin, UA: NEGATIVE
Ketones, UA: NEGATIVE
Nitrite, UA: POSITIVE — AB
RBC, UA: NEGATIVE
Specific Gravity, UA: >1.03 — ABNORMAL HIGH (ref 1.005–1.030)
Urobilinogen, Ur: 0.2 mg/dL (ref 0.2–1.0)
pH, UA: 5.5 (ref 5.0–7.5)

## 2021-09-03 LAB — MICROSCOPIC EXAMINATION
Epithelial Cells (non renal): >10 /hpf — AB (ref 0–10)
RBC, Urine: NONE SEEN /hpf (ref 0–2)
Renal Epithel, UA: NONE SEEN /hpf
WBC, UA: >30 /hpf — AB (ref 0–5)

## 2021-09-03 MED ORDER — NITROFURANTOIN MONOHYD MACRO 100 MG PO CAPS: 100.0000 mg | ORAL_CAPSULE | Freq: Two times a day (BID) | ORAL | 0 refills | Status: DC

## 2021-09-03 MED ORDER — ALLOPURINOL 300 MG PO TABS: 300.0000 mg | ORAL_TABLET | Freq: Every day | ORAL | 11 refills | Status: DC

## 2021-09-03 MED ORDER — POTASSIUM CITRATE ER 15 MEQ (1620 MG) PO TBCR: 1.0000 | EXTENDED_RELEASE_TABLET | Freq: Two times a day (BID) | ORAL | 11 refills | Status: DC

## 2021-09-03 NOTE — Progress Notes (Signed)
Urological Symptom Review  Patient is experiencing the following symptoms: Frequent urination Burning/pain with urination Get up at night to urinate Leakage of urine Blood in urine   Review of Systems  Gastrointestinal (upper)  : Negative for upper GI symptoms  Gastrointestinal (lower) : Diarrhea  Constitutional : Fatigue  Skin: Negative for skin symptoms  Eyes: Negative for eye symptoms  Ear/Nose/Throat : Negative for Ear/Nose/Throat symptoms  Hematologic/Lymphatic: Easy bruising  Cardiovascular : Leg swelling  Respiratory : Negative for respiratory symptoms  Endocrine: Negative for endocrine symptoms  Musculoskeletal: Back pain Joint pain  Neurological: Headaches Dizziness  Psychologic: Depression Anxiety

## 2021-09-03 NOTE — Patient Instructions (Signed)
Dietary Guidelines to Help Prevent Kidney Stones Kidney stones are deposits of minerals and salts that form inside your kidneys. Your risk of developing kidney stones may be greater depending on your diet, your lifestyle, the medicines you take, and whether you have certain medical conditions. Most people can lower their chances of developing kidney stones by following the instructions below. Your dietitian may give you more specific instructions depending on your overall health and the type of kidney stones you tend to develop. What are tips for following this plan? Reading food labels  Choose foods with "no salt added" or "low-salt" labels. Limit your salt (sodium) intake to less than 1,500 mg a day. Choose foods with calcium for each meal and snack. Try to eat about 300 mg of calcium at each meal. Foods that contain 200-500 mg of calcium a serving include: 8 oz (237 mL) of milk, calcium-fortifiednon-dairy milk, and calcium-fortifiedfruit juice. Calcium-fortified means that calcium has been added to these drinks. 8 oz (237 mL) of kefir, yogurt, and soy yogurt. 4 oz (114 g) of tofu. 1 oz (28 g) of cheese. 1 cup (150 g) of dried figs. 1 cup (91 g) of cooked broccoli. One 3 oz (85 g) can of sardines or mackerel. Most people need 1,000-1,500 mg of calcium a day. Talk to your dietitian about how much calcium is recommended for you. Shopping Buy plenty of fresh fruits and vegetables. Most people do not need to avoid fruits and vegetables, even if these foods contain nutrients that may contribute to kidney stones. When shopping for convenience foods, choose: Whole pieces of fruit. Pre-made salads with dressing on the side. Low-fat fruit and yogurt smoothies. Avoid buying frozen meals or prepared deli foods. These can be high in sodium. Look for foods with live cultures, such as yogurt and kefir. Choose high-fiber grains, such as whole-wheat breads, oat bran, and wheat cereals. Cooking Do not add  salt to food when cooking. Place a salt shaker on the table and allow each person to add his or her own salt to taste. Use vegetable protein, such as beans, textured vegetable protein (TVP), or tofu, instead of meat in pasta, casseroles, and soups. Meal planning Eat less salt, if told by your dietitian. To do this: Avoid eating processed or pre-made food. Avoid eating fast food. Eat less animal protein, including cheese, meat, poultry, or fish, if told by your dietitian. To do this: Limit the number of times you have meat, poultry, fish, or cheese each week. Eat a diet free of meat at least 2 days a week. Eat only one serving each day of meat, poultry, fish, or seafood. When you prepare animal protein, cut pieces into small portion sizes. For most meat and fish, one serving is about the size of the palm of your hand. Eat at least five servings of fresh fruits and vegetables each day. To do this: Keep fruits and vegetables on hand for snacks. Eat one piece of fruit or a handful of berries with breakfast. Have a salad and fruit at lunch. Have two kinds of vegetables at dinner. Limit foods that are high in a substance called oxalate. These include: Spinach (cooked), rhubarb, beets, sweet potatoes, and Swiss chard. Peanuts. Potato chips, french fries, and baked potatoes with skin on. Nuts and nut products. Chocolate. If you regularly take a diuretic medicine, make sure to eat at least 1 or 2 servings of fruits or vegetables that are high in potassium each day. These include: Avocado. Banana. Orange, prune,   carrot, or tomato juice. Baked potato. Cabbage. Beans and split peas. Lifestyle  Drink enough fluid to keep your urine pale yellow. This is the most important thing you can do. Spread your fluid intake throughout the day. If you drink alcohol: Limit how much you use to: 0-1 drink a day for women who are not pregnant. 0-2 drinks a day for men. Be aware of how much alcohol is in your  drink. In the [place] one drink equals one 12 oz bottle of beer (355 mL), one 5 oz glass of wine (148 mL), or one 1 oz glass of hard liquor (44 mL). Lose weight if told by your health care provider. Work with your dietitian to find an eating plan and weight loss strategies that work best for you. General information Talk to your health care provider and dietitian about taking daily supplements. You may be told the following depending on your health and the cause of your kidney stones: Not to take supplements with vitamin C. To take a calcium supplement. To take a daily probiotic supplement. To take other supplements such as magnesium, fish oil, or vitamin B6. Take over-the-counter and prescription medicines only as told by your health care provider. These include supplements. What foods should I limit? Limit your intake of the following foods, or eat them as told by your dietitian. Vegetables Spinach. Rhubarb. Beets. Canned vegetables. Pickles. Olives. Baked potatoes with skin. Grains Wheat bran. Baked goods. Salted crackers. Cereals high in sugar. Meats and other proteins Nuts. Nut butters. Large portions of meat, poultry, or fish. Salted, precooked, or cured meats, such as sausages, meat loaves, and hot dogs. Dairy Cheese. Beverages Regular soft drinks. Regular vegetable juice. Seasonings and condiments Seasoning blends with salt. Salad dressings. Soy sauce. Ketchup. Barbecue sauce. Other foods Canned soups. Canned pasta sauce. Casseroles. Pizza. Lasagna. Frozen meals. Potato chips. French fries. The items listed above may not be a complete list of foods and beverages you should limit. Contact a dietitian for more information. What foods should I avoid? Talk to your dietitian about specific foods you should avoid based on the type of kidney stones you have and your overall health. Fruits Grapefruit. The item listed above may not be a complete list of foods and beverages you should  avoid. Contact a dietitian for more information. Summary Kidney stones are deposits of minerals and salts that form inside your kidneys. You can lower your risk of kidney stones by making changes to your diet. The most important thing you can do is drink enough fluid. Drink enough fluid to keep your urine pale yellow. Talk to your dietitian about how much calcium you should have each day, and eat less salt and animal protein as told by your dietitian. This information is not intended to replace advice given to you by your health care provider. Make sure you discuss any questions you have with your health care provider. Document Revised: 09/12/2019 Document Reviewed: 09/12/2019 Elsevier Patient Education  2022 Elsevier Inc.  

## 2021-09-03 NOTE — Progress Notes (Signed)
09/03/2021 11:15 AM   Denise Avila 1975-10-16 [id number]  Referring provider: Lennie Odor, [address] [employer] [address] [place],  [place] [address]  Followup nephrolithiasis and new pelvic pain  HPI: Denise Avila is a 45yo here for followup for nephrolithiasis. Uric acid was elevated on labs and urine pH is 5.5. Renal US 11/28 shows possible right lower pole 44mm calculus. She denies any flank pain. She does complain today of new urinary frequency, urgency, dysuria and suprapubic pressure which was been present for over 10 days. UA is concerning for infection   PMH: Past Medical History:  Diagnosis Date   Abnormal Pap smear of cervix 12/2014   Acid reflux    Adrenal insufficiency (HCC)    Anxiety    Arthritis    Arthritis    Depression    Depression    Endometriosis    Fibroids    age 29   High cholesterol    History of kidney stones    Liver disease    per pt report, will have biopsy Nov 08, 2017   Migraines    Pleurisy 02/2015   while hospitalized    Seizure ([place])    on meds and no seizures in 2-3 years. Unknown etiology   Skin cancer (melanoma) ([place])    Syncope and collapse    Uterine cancer [facility])     Surgical History: Past Surgical History:  Procedure Laterality Date   ABDOMINAL HYSTERECTOMY     ABLATION     uterine   COLECTOMY     CYSTOSCOPY W/ URETERAL STENT PLACEMENT Left 02/02/2015   Procedure: CYSTOSCOPY WITH RETROGRADE PYELOGRAM/URETERAL STENT PLACEMENT;  Surgeon: Festus Aloe, MD;  Location: [facility];  Service: Urology;  Laterality: Left;   CYSTOSCOPY WITH URETEROSCOPY AND STENT PLACEMENT Left 02/13/2015   Procedure: LEFT URETEROSCOPY WITH HOLMIUM LASER AND STENT PLACEMENT;  Surgeon: Festus Aloe, MD;  Location: [facility];  Service: Urology;  Laterality: Left;   DILATION AND CURETTAGE OF UTERUS     ESOPHAGOGASTRODUODENOSCOPY ENDOSCOPY  10/11/2017   @ [facility]; BIOPSY OF LOWER INTESTINE   HOLMIUM LASER APPLICATION N/A [id number]/9381   Procedure:  HOLMIUM LASER APPLICATION;  Surgeon: Festus Aloe, MD;  Location: [facility];  Service: Urology;  Laterality: N/A;   IR NEPHROSTOMY PLACEMENT RIGHT  07/05/2021   KNEE SURGERY Bilateral 2019   realignment of knees   LAPAROSCOPIC GASTRIC SLEEVE RESECTION     LAPAROSCOPY     x 4   MELANOMA EXCISION  2000   melanoma removed from face, basal cell removed from nose   NEPHROLITHOTOMY Right 07/08/2021   Procedure: NEPHROLITHOTOMY PERCUTANEOUS- with stent placement;  Surgeon: Cleon Gustin, MD;  Location: [facility];  Service: Urology;  Laterality: Right;   REPAIR VAGINAL CUFF N/A 08/21/2015   Procedure: REPAIR VAGINAL CUFF, EXAM UNDER ANESTHESIA;  Surgeon: Everitt Amber, MD;  Location: [facility];  Service: Gynecology;  Laterality: N/A;   ROBOTIC ASSISTED TOTAL HYSTERECTOMY WITH BILATERAL SALPINGO OOPHERECTOMY Bilateral 05/21/2015   Procedure: XI ROBOTIC ASSISTED TOTAL HYSTERECTOMY WITH BILATERAL SALPINGO OOPHORECTOMY;  Surgeon: Everitt Amber, MD;  Location: [facility];  Service: Gynecology;  Laterality: Bilateral;    Home Medications:  Allergies as of 09/03/2021       Reactions   Penicillins Swelling, Rash   Rash all over [place] and lip swelling. Has patient had a PCN reaction causing immediate rash, facial/tongue/throat swelling, SOB or lightheadedness with hypotension: Yes Has patient had a PCN reaction causing severe rash involving mucus membranes or skin  necrosis: Yes Has patient had a PCN reaction that required hospitalization: No If all of the above answers are "NO", then may proceed with Cephalosporin use.   Nortriptyline Other (See Comments)   Severe chest pain    Nsaids Other (See Comments)   Advised not to take due to gastric sleeve    Amoxicillin Rash   Rash all over [place]   Ciprofloxacin Diarrhea, Nausea Only        Medication List        Accurate as of September 03, 2021 11:15 AM. If you have any questions, ask your nurse or doctor.          allopurinol 300 MG tablet Commonly  known as: Zyloprim Take 1 tablet (300 mg total) by mouth daily. Started by: Nicolette Bang, MD   baclofen 10 MG tablet Commonly known as: LIORESAL Take 10 mg by mouth 3 (three) times daily.   celecoxib 200 MG capsule Commonly known as: CELEBREX Take 200 mg by mouth daily.   citalopram 10 MG tablet Commonly known as: CELEXA Take 10 mg by mouth daily.   clonazePAM 1 MG tablet Commonly known as: KLONOPIN Take 1 mg by mouth daily.   diphenhydrAMINE 25 MG tablet Commonly known as: BENADRYL Take 25 mg by mouth every 6 (six) hours as needed (take with promethazine for migraines).   DULoxetine 60 MG capsule Commonly known as: CYMBALTA Take 60 mg by mouth at bedtime.   DULoxetine 30 MG capsule Commonly known as: CYMBALTA Take 30 mg by mouth daily.   ezetimibe 10 MG tablet Commonly known as: ZETIA Take 10 mg by mouth daily.   gabapentin 800 MG tablet Commonly known as: NEURONTIN Take 1,600 tablets by mouth 2 (two) times daily.   GAS-X PO Take 1-2 tablets by mouth 3 (three) times daily as needed (gas).   lamoTRIgine 200 MG tablet Commonly known as: LAMICTAL Take 200 mg by mouth every evening.   methenamine 1 g tablet Commonly known as: MANDELAMINE Take 1 tablet (1,000 mg total) by mouth 2 (two) times daily.   mirabegron ER 25 MG Tb24 tablet Commonly known as: MYRBETRIQ Take 1 tablet (25 mg total) by mouth daily.   naratriptan 2.5 MG tablet Commonly known as: AMERGE Take 2.5 mg by mouth as needed for migraine. Take one (1) tablet at onset of headache; if returns or does not resolve, may repeat after 4 hours; do not exceed five (5) mg in 24 hours.   nitrofurantoin (macrocrystal-monohydrate) 100 MG capsule Commonly known as: MACROBID Take 1 capsule (100 mg total) by mouth every 12 (twelve) hours. Started by: Nicolette Bang, MD   omeprazole 40 MG capsule Commonly known as: PRILOSEC Take 40 mg by mouth 2 (two) times daily.   OVER THE COUNTER MEDICATION Take 1  tablet by mouth daily. Bariatric Pal otc vitamin   oxyCODONE-acetaminophen 5-325 MG tablet Commonly known as: Percocet Take 1 tablet by mouth every 4 (four) hours as needed.   Potassium Citrate 15 MEQ (1620 MG) Tbcr Take 1 tablet by mouth 2 (two) times daily. Started by: Nicolette Bang, MD   promethazine 25 MG tablet Commonly known as: PHENERGAN Take 1 tablet (25 mg total) by mouth 4 (four) times daily as needed for nausea or vomiting.   QUEtiapine 25 MG tablet Commonly known as: SEROQUEL Take 50 mg by mouth at bedtime.   rizatriptan 10 MG tablet Commonly known as: MAXALT Take 10 mg by mouth as needed for migraine.   rosuvastatin 5 MG tablet Commonly  known as: CRESTOR Take 5 mg by mouth daily.   sucralfate 1 g tablet Commonly known as: Carafate Take 1 tablet (1 g total) by mouth 4 (four) times daily.   tiZANidine 2 MG tablet Commonly known as: ZANAFLEX Take 2 mg by mouth 3 (three) times daily.   Toviaz 4 MG Tb24 tablet Generic drug: fesoterodine TAKE 1 TABLET(4 MG) BY MOUTH DAILY   Ubrelvy 100 MG Tabs Generic drug: Ubrogepant Take 100 mg by mouth daily as needed (migraines).        Allergies:  Allergies  Allergen Reactions   Penicillins Swelling and Rash    Rash all over [place] and lip swelling. Has patient had a PCN reaction causing immediate rash, facial/tongue/throat swelling, SOB or lightheadedness with hypotension: Yes Has patient had a PCN reaction causing severe rash involving mucus membranes or skin necrosis: Yes Has patient had a PCN reaction that required hospitalization: No If all of the above answers are "NO", then may proceed with Cephalosporin use.   Nortriptyline Other (See Comments)    Severe chest pain     Nsaids Other (See Comments)    Advised not to take due to gastric sleeve    Amoxicillin Rash    Rash all over [place]    Ciprofloxacin Diarrhea and Nausea Only    Family History: Family History  Problem Relation Age of Onset    Hypertension Mother    Melanoma Mother    Breast cancer Mother 57   Cancer - Other Father        liver   Diabetes Mellitus II Maternal Grandmother    Heart disease Maternal Grandmother    Breast cancer Maternal Grandmother    Cancer - Lung Maternal Grandfather    Osteoporosis Paternal Grandmother    Melanoma Paternal Grandfather     Social History:  reports that she has never smoked. She has never used smokeless tobacco. She reports that she does not drink alcohol and does not use drugs.  ROS: All other review of systems were reviewed and are negative except what is noted above in HPI  Physical Exam: BP 117/78   Pulse 93   LMP 05/04/2015 (Approximate)   Constitutional:  Alert and oriented, No acute distress. HEENT: [place] AT, moist mucus membranes.  Trachea midline, no masses. Cardiovascular: No clubbing, cyanosis, or edema. Respiratory: Normal respiratory effort, no increased work of breathing. GI: Abdomen is soft, nontender, nondistended, no abdominal masses GU: No CVA tenderness.  Lymph: No cervical or inguinal lymphadenopathy. Skin: No rashes, bruises or suspicious lesions. Neurologic: Grossly intact, no focal deficits, moving all 4 extremities. Psychiatric: Normal mood and affect.  Laboratory Data: Lab Results  Component Value Date   WBC 11.0 (H) 07/09/2021   HGB 11.6 (L) 07/09/2021   HCT 35.1 (L) 07/09/2021   MCV 94.1 07/09/2021   PLT 167 07/09/2021    Lab Results  Component Value Date   CREATININE 0.84 07/21/2021    No results found for: PSA  No results found for: TESTOSTERONE  No results found for: HGBA1C  Urinalysis    Component Value Date/Time   COLORURINE YELLOW (A) 06/21/2017 1735   APPEARANCEUR Clear 06/28/2021 1411   LABSPEC 1.019 06/21/2017 1735   PHURINE 5.0 06/21/2017 1735   GLUCOSEU Negative 06/28/2021 1411   [place] 06/21/2017 1735   BILIRUBINUR Negative 06/28/2021 1411   KETONESUR NEGATIVE 06/21/2017 1735   PROTEINUR 2+ (A)  06/28/2021 1411   PROTEINUR NEGATIVE 06/21/2017 1735   UROBILINOGEN 0.2 01/27/2020 1124  UROBILINOGEN 1.0 05/18/2015 1359   NITRITE Negative 06/28/2021 1411   NITRITE NEGATIVE 06/21/2017 1735   LEUKOCYTESUR Trace (A) 06/28/2021 1411    Lab Results  Component Value Date   LABMICR See below: 06/28/2021   WBCUA 0-5 06/28/2021   LABEPIT 0-10 06/28/2021   BACTERIA Few (A) 06/28/2021    Pertinent Imaging: Renal US 08/30/2021: Images reviewed and discussed with the patient Results for orders placed during the hospital encounter of 11/27/15  DG Abd 1 View  Narrative CLINICAL DATA:  Constipation, Sitz mark study ; markers ingested 5 days ago, left upper quadrant and left lower quadrant abdominal pain.  EXAM: ABDOMEN - 1 VIEW  COMPARISON:  [facility] images of November 25, 2015  FINDINGS: There is a single Sitz marker that projects over the distal transverse colon. There are 3 Sitz markers that project in the pelvis likely in the sigmoid. The stool burden within the ascending and transverse colon is moderate. The descending and rectosigmoid colonic stool burden is relatively small. There is no small bowel obstruction. The bony structures are unremarkable.  IMPRESSION: There has been further progression of the Sitz markers. The colonic stool burden in the right and transverse colon is moderate.   Electronically Signed By: David  Martinique M.D. On: 11/27/2015 14:10  No results found for this or any previous visit.  No results found for this or any previous visit.  No results found for this or any previous visit.  Results for orders placed during the hospital encounter of 08/30/21  Ultrasound renal complete  Narrative CLINICAL DATA:  History of nephrolithiasis. History of prior nephrolithotomy and nephrostomy.  EXAM: RENAL / URINARY TRACT ULTRASOUND COMPLETE  COMPARISON:  IR study 07/05/2021.  CT 03/02/2020.  FINDINGS: Right Kidney:  Renal measurements: 17.3 x 4.5 x  6.2 cm = volume: 164.8 mL. Echogenicity within normal limits. No mass or hydronephrosis visualized. 5 mm nonobstructing lower pole calyceal stone.  Left Kidney:  Renal measurements: 11.1 x 5.3 x 5.1 cm = volume: 155.8 mL. Echogenicity within normal limits. No mass or hydronephrosis visualized.  Bladder:  The bladder is nondistended.  Other:  Exam slightly limited due to patient's body habitus and bowel gas.  IMPRESSION: 5 mm nonobstructing tiny right renal lower pole calyceal stone. No hydronephrosis.   Electronically Signed By: Marcello Moores  Register M.D. On: 08/31/2021 05:17  No results found for this or any previous visit.  No results found for this or any previous visit.  Results for orders placed during the hospital encounter of 01/01/20  CT RENAL STONE STUDY  Narrative CLINICAL DATA:  Right flank pain radiating to right groin. Hematuria. Urinary frequency. History of kidney stones.  EXAM: CT ABDOMEN AND PELVIS WITHOUT CONTRAST  TECHNIQUE: Multidetector CT imaging of the abdomen and pelvis was performed following the standard protocol without IV contrast.  COMPARISON:  08/21/2015  FINDINGS: Lower chest: Lung bases are clear.  Hepatobiliary: Unenhanced liver is unremarkable.  Gallbladder is unremarkable. No intrahepatic or extrahepatic ductal dilatation.  Pancreas: Within normal limits.  Spleen: Within normal limits.  Adrenals/Urinary Tract: Adrenal glands are within normal limits.  Suspected 14 mm cyst in the posterior right upper kidney (series 2/image 28), poorly evaluated. 3 mm nonobstructing right lower pole renal calculus (series 2/image 40). Left kidney is within normal limits.  No ureteral or bladder calculi.  No hydronephrosis.  Bladder is within normal limits.  Stomach/Bowel: Postsurgical changes involving the stomach.  Status post subtotal colectomy with ileorectal anastomosis.  No evidence of bowel obstruction.  Appendix is  surgically absent.  Vascular/Lymphatic: No evidence of abdominal aortic aneurysm.  11 mm short axis node in the right mid abdomen, likely reflecting an ileocolic node (series 2/image 85), borderline enlarged.  Reproductive: Status post hysterectomy.  No adnexal masses.  Other: No abdominopelvic ascites.  Musculoskeletal: Visualized osseous structures are within normal limits.  IMPRESSION: 3 mm nonobstructing right lower pole renal calculus. No ureteral or bladder calculi. No hydronephrosis.  Status post subtotal colectomy with ileorectal anastomosis. Additional postsurgical changes as above.  11 mm short axis ileocolic node in the right mid abdomen. In the absence of known malignancy, this is likely reactive given adjacent postsurgical changes. In the setting of malignancy, consider follow-up in 3-6 months.   Electronically Signed By: Julian Hy M.D. On: 01/01/2020 21:17   Assessment & Plan:    1. Urinary frequency -Likely related to UTI - Urinalysis, Routine w reflex microscopic  2. Dysuria -Likely related to UTI - Urinalysis, Routine w reflex microscopic  3. Chronic cystitis with hematuria -Urine for culture, will call with results -macrobid 100mg  BID for 7 days - Urinalysis, Routine w reflex microscopic - Urine Culture  4. Kidney stones -allopurinol 300mg  daily -Urocit K 47meq BID -RTC 2 weeks for BMP - Basic metabolic panel; Future   No follow-ups on file.  Nicolette Bang, MD  [facility] [place]

## 2021-09-07 LAB — URINE CULTURE

## 2021-09-16 ENCOUNTER — Telehealth: Payer: Self-pay

## 2021-09-16 ENCOUNTER — Other Ambulatory Visit: Payer: Medicare HMO

## 2021-09-16 DIAGNOSIS — R300 Dysuria: Secondary | ICD-10-CM

## 2021-09-16 MED ORDER — NITROFURANTOIN MONOHYD MACRO 100 MG PO CAPS: 100.0000 mg | ORAL_CAPSULE | Freq: Two times a day (BID) | ORAL | 0 refills | Status: DC

## 2021-09-16 NOTE — Telephone Encounter (Signed)
Patient completed abt on Monday and symptoms have come back. Patient placed on lab schedule for urine drop off.  Per Dr. Alyson Ingles ok to start on Macrobid once pt drops off specimen.

## 2021-09-20 ENCOUNTER — Other Ambulatory Visit: Payer: Self-pay

## 2021-09-20 ENCOUNTER — Other Ambulatory Visit: Payer: Medicare HMO

## 2021-09-20 DIAGNOSIS — R531 Weakness: Secondary | ICD-10-CM | POA: Diagnosis not present

## 2021-09-20 DIAGNOSIS — R2689 Other abnormalities of gait and mobility: Secondary | ICD-10-CM | POA: Diagnosis not present

## 2021-09-20 DIAGNOSIS — M25571 Pain in right ankle and joints of right foot: Secondary | ICD-10-CM | POA: Diagnosis not present

## 2021-09-20 DIAGNOSIS — M542 Cervicalgia: Secondary | ICD-10-CM | POA: Diagnosis not present

## 2021-09-20 DIAGNOSIS — N200 Calculus of kidney: Secondary | ICD-10-CM | POA: Diagnosis not present

## 2021-09-20 DIAGNOSIS — M545 Low back pain, unspecified: Secondary | ICD-10-CM | POA: Diagnosis not present

## 2021-09-21 LAB — BASIC METABOLIC PANEL
BUN/Creatinine Ratio: 23 (ref 9–23)
BUN: 18 mg/dL (ref 6–24)
CO2: 26 mmol/L (ref 20–29)
Calcium: 9.5 mg/dL (ref 8.7–10.2)
Chloride: 102 mmol/L (ref 96–106)
Creatinine, Ser: 0.78 mg/dL (ref 0.57–1.00)
Glucose: 93 mg/dL (ref 70–99)
Potassium: 5.5 mmol/L — ABNORMAL HIGH (ref 3.5–5.2)
Sodium: 140 mmol/L (ref 134–144)
eGFR: 96 mL/min/{1.73_m2} (ref >59)

## 2021-09-22 DIAGNOSIS — R531 Weakness: Secondary | ICD-10-CM | POA: Diagnosis not present

## 2021-09-22 DIAGNOSIS — M25571 Pain in right ankle and joints of right foot: Secondary | ICD-10-CM | POA: Diagnosis not present

## 2021-09-22 DIAGNOSIS — M542 Cervicalgia: Secondary | ICD-10-CM | POA: Diagnosis not present

## 2021-09-22 DIAGNOSIS — R2689 Other abnormalities of gait and mobility: Secondary | ICD-10-CM | POA: Diagnosis not present

## 2021-09-22 DIAGNOSIS — M545 Low back pain, unspecified: Secondary | ICD-10-CM | POA: Diagnosis not present

## 2021-09-29 ENCOUNTER — Telehealth: Payer: Self-pay

## 2021-09-29 NOTE — Telephone Encounter (Signed)
-----   Message from Cleon Gustin, MD sent at 09/28/2021 10:51 AM EST ----- Potassium is high. She needs to stop urocit K ----- Message ----- From: Clearence Cheek, CMA Sent: 09/21/2021   7:59 AM EST To: Cleon Gustin, MD  Please review

## 2021-09-29 NOTE — Telephone Encounter (Signed)
Detailed message was left on patient voicemail

## 2021-09-30 DIAGNOSIS — F411 Generalized anxiety disorder: Secondary | ICD-10-CM | POA: Diagnosis not present

## 2021-09-30 DIAGNOSIS — G2581 Restless legs syndrome: Secondary | ICD-10-CM | POA: Diagnosis not present

## 2021-09-30 DIAGNOSIS — F4310 Post-traumatic stress disorder, unspecified: Secondary | ICD-10-CM | POA: Diagnosis not present

## 2021-10-01 ENCOUNTER — Telehealth: Payer: Self-pay

## 2021-10-01 DIAGNOSIS — N3021 Other chronic cystitis with hematuria: Secondary | ICD-10-CM

## 2021-10-01 MED ORDER — FLUCONAZOLE 150 MG PO TABS
150.0000 mg | ORAL_TABLET | Freq: Every day | ORAL | 0 refills | Status: DC
Start: 2021-10-01 — End: 2021-10-18

## 2021-10-01 MED ORDER — CEFUROXIME AXETIL 500 MG PO TABS: 500.0000 mg | ORAL_TABLET | Freq: Two times a day (BID) | ORAL | 0 refills | Status: DC

## 2021-10-01 NOTE — Telephone Encounter (Signed)
Patient called today still with UTI symptoms despite two rounds of macrobid.   Reviewed last urine culture with Dr. Alyson Ingles

## 2021-10-11 DIAGNOSIS — M545 Low back pain, unspecified: Secondary | ICD-10-CM | POA: Diagnosis not present

## 2021-10-11 DIAGNOSIS — M25571 Pain in right ankle and joints of right foot: Secondary | ICD-10-CM | POA: Diagnosis not present

## 2021-10-11 DIAGNOSIS — R2689 Other abnormalities of gait and mobility: Secondary | ICD-10-CM | POA: Diagnosis not present

## 2021-10-11 DIAGNOSIS — M542 Cervicalgia: Secondary | ICD-10-CM | POA: Diagnosis not present

## 2021-10-11 DIAGNOSIS — R531 Weakness: Secondary | ICD-10-CM | POA: Diagnosis not present

## 2021-10-13 ENCOUNTER — Telehealth: Payer: Self-pay

## 2021-10-13 ENCOUNTER — Ambulatory Visit (HOSPITAL_COMMUNITY)
Admission: RE | Admit: 2021-10-13 | Discharge: 2021-10-13 | Disposition: A | Discharge status: Home / Self Care | Payer: Medicare HMO | Source: Ambulatory Visit | Attending: Urology | Admitting: Urology

## 2021-10-13 ENCOUNTER — Other Ambulatory Visit: Payer: Self-pay

## 2021-10-13 ENCOUNTER — Ambulatory Visit: Payer: Medicare HMO | Admitting: Physician Assistant

## 2021-10-13 VITALS — BP 100/70 | HR 82 | Ht 72.0 in | Wt 220.0 lb

## 2021-10-13 DIAGNOSIS — R109 Unspecified abdominal pain: Secondary | ICD-10-CM | POA: Insufficient documentation

## 2021-10-13 DIAGNOSIS — N3021 Other chronic cystitis with hematuria: Secondary | ICD-10-CM | POA: Diagnosis not present

## 2021-10-13 DIAGNOSIS — Z9049 Acquired absence of other specified parts of digestive tract: Secondary | ICD-10-CM | POA: Diagnosis not present

## 2021-10-13 DIAGNOSIS — R81 Glycosuria: Secondary | ICD-10-CM | POA: Insufficient documentation

## 2021-10-13 DIAGNOSIS — N200 Calculus of kidney: Secondary | ICD-10-CM

## 2021-10-13 DIAGNOSIS — R350 Frequency of micturition: Secondary | ICD-10-CM | POA: Diagnosis not present

## 2021-10-13 DIAGNOSIS — Z9889 Other specified postprocedural states: Secondary | ICD-10-CM | POA: Diagnosis not present

## 2021-10-13 DIAGNOSIS — N3081 Other cystitis with hematuria: Secondary | ICD-10-CM | POA: Diagnosis not present

## 2021-10-13 DIAGNOSIS — R319 Hematuria, unspecified: Secondary | ICD-10-CM | POA: Diagnosis not present

## 2021-10-13 LAB — BLADDER SCAN AMB NON-IMAGING

## 2021-10-13 MED ORDER — NITROFURANTOIN MONOHYD MACRO 100 MG PO CAPS: 100.0000 mg | ORAL_CAPSULE | Freq: Two times a day (BID) | ORAL | 0 refills | Status: DC

## 2021-10-13 NOTE — Progress Notes (Signed)
The days  10/13/2021 3:13 PM   Denise Avila Feb 16, 1976 [id number]  Referring provider: Lennie Odor, [place] [employer] [place],  [place] [address]  No chief complaint on file. UTI and flank pain  HPI: Patient is a 46 year old female who presents for recurrence of right flank pain, lower abdominal pain and pressure, urinary frequency, burning, nocturia, urinary straining and weak stream, and what she thinks gross hematuria because of pink-tinge to her urine.  CT stone study ordered prior to office visit revealed 2 small stones in right kidney without ureteral stones or evidence of obstruction.Pt completed a course of Ceftin 3 days ago and symptoms have worsened since.  She denies fever with nausea but no vomiting. Pt denies h/o diabetes or glucose abnormalities in the past.  PVR=49ml UA today: +glucose, nitrite+, WBC 11-40, RBC 3-10, many bacteria Labs reviewed from 2018-present-glucose levels noted to range from 89-125 with consistent elevations.  Cx: 09/03/21 E. Coli Resistant to ampicillin, gentamicin, tetracycline, and trimethoprim sulfa.  Intermediate resistance to tobramycin. 01/14/20 E.Coli pansensitive  09/03/21 [name] is a 46yo here for followup for nephrolithiasis. Uric acid was elevated on labs and urine pH is 5.5. Renal US 11/28 shows possible right lower pole 43mm calculus. She denies any flank pain. She does complain today of new urinary frequency, urgency, dysuria and suprapubic pressure which was been present for over 10 days. UA is concerning for infection  PMH: Past Medical History:  Diagnosis Date   Abnormal Pap smear of cervix 12/2014   Acid reflux    Adrenal insufficiency (HCC)    Anxiety    Arthritis    Arthritis    Depression    Depression    Endometriosis    Fibroids    age 25   High cholesterol    History of kidney stones    Liver disease    per pt report, will have biopsy Nov 08, 2017   Migraines    Pleurisy 02/2015   while  hospitalized    Seizure ([place])    on meds and no seizures in 2-3 years. Unknown etiology   Skin cancer (melanoma) ([place])    Syncope and collapse    Uterine cancer [facility])     Surgical History: Past Surgical History:  Procedure Laterality Date   ABDOMINAL HYSTERECTOMY     ABLATION     uterine   COLECTOMY     CYSTOSCOPY W/ URETERAL STENT PLACEMENT Left 02/02/2015   Procedure: CYSTOSCOPY WITH RETROGRADE PYELOGRAM/URETERAL STENT PLACEMENT;  Surgeon: Festus Aloe, MD;  Location: [facility];  Service: Urology;  Laterality: Left;   CYSTOSCOPY WITH URETEROSCOPY AND STENT PLACEMENT Left 02/13/2015   Procedure: LEFT URETEROSCOPY WITH HOLMIUM LASER AND STENT PLACEMENT;  Surgeon: Festus Aloe, MD;  Location: [facility];  Service: Urology;  Laterality: Left;   DILATION AND CURETTAGE OF UTERUS     ESOPHAGOGASTRODUODENOSCOPY ENDOSCOPY  10/11/2017   @ [facility]; BIOPSY OF LOWER INTESTINE   HOLMIUM LASER APPLICATION N/A 38/07/1750   Procedure: HOLMIUM LASER APPLICATION;  Surgeon: Festus Aloe, MD;  Location: [facility];  Service: Urology;  Laterality: N/A;   IR NEPHROSTOMY PLACEMENT RIGHT  07/05/2021   KNEE SURGERY Bilateral 2019   realignment of knees   LAPAROSCOPIC GASTRIC SLEEVE RESECTION     LAPAROSCOPY     x 4   MELANOMA EXCISION  2000   melanoma removed from face, basal cell removed from nose   NEPHROLITHOTOMY Right 07/08/2021   Procedure: NEPHROLITHOTOMY PERCUTANEOUS- with stent placement;  Surgeon:  McKenzie, Candee Furbish, MD;  Location: [facility];  Service: Urology;  Laterality: Right;   REPAIR VAGINAL CUFF N/A 08/21/2015   Procedure: REPAIR VAGINAL CUFF, EXAM UNDER ANESTHESIA;  Surgeon: Everitt Amber, MD;  Location: [facility];  Service: Gynecology;  Laterality: N/A;   ROBOTIC ASSISTED TOTAL HYSTERECTOMY WITH BILATERAL SALPINGO OOPHERECTOMY Bilateral 05/21/2015   Procedure: XI ROBOTIC ASSISTED TOTAL HYSTERECTOMY WITH BILATERAL SALPINGO OOPHORECTOMY;  Surgeon: Everitt Amber, MD;  Location: [facility];  Service:  Gynecology;  Laterality: Bilateral;    Home Medications:  Allergies as of 10/13/2021       Reactions   Penicillins Swelling, Rash   Rash all over [place] and lip swelling. Has patient had a PCN reaction causing immediate rash, facial/tongue/throat swelling, SOB or lightheadedness with hypotension: Yes Has patient had a PCN reaction causing severe rash involving mucus membranes or skin necrosis: Yes Has patient had a PCN reaction that required hospitalization: No If all of the above answers are "NO", then may proceed with Cephalosporin use.   Nortriptyline Other (See Comments)   Severe chest pain    Nsaids Other (See Comments)   Advised not to take due to gastric sleeve    Amoxicillin Rash   Rash all over [place]   Ciprofloxacin Diarrhea, Nausea Only        Medication List        Accurate as of October 13, 2021  3:13 PM. If you have any questions, ask your nurse or doctor.          allopurinol 300 MG tablet Commonly known as: Zyloprim Take 1 tablet (300 mg total) by mouth daily.   baclofen 10 MG tablet Commonly known as: LIORESAL Take 10 mg by mouth 3 (three) times daily.   cefUROXime 500 MG tablet Commonly known as: CEFTIN Take 1 tablet (500 mg total) by mouth 2 (two) times daily with a meal.   celecoxib 200 MG capsule Commonly known as: CELEBREX Take 200 mg by mouth daily.   citalopram 10 MG tablet Commonly known as: CELEXA Take 10 mg by mouth daily.   clonazePAM 1 MG tablet Commonly known as: KLONOPIN Take 1 mg by mouth daily.   diphenhydrAMINE 25 MG tablet Commonly known as: BENADRYL Take 25 mg by mouth every 6 (six) hours as needed (take with promethazine for migraines).   DULoxetine 60 MG capsule Commonly known as: CYMBALTA Take 60 mg by mouth at bedtime.   DULoxetine 30 MG capsule Commonly known as: CYMBALTA Take 30 mg by mouth daily.   ezetimibe 10 MG tablet Commonly known as: ZETIA Take 10 mg by mouth daily.   fluconazole 150 MG  tablet Commonly known as: DIFLUCAN Take 1 tablet (150 mg total) by mouth daily.   gabapentin 800 MG tablet Commonly known as: NEURONTIN Take 1,600 tablets by mouth 2 (two) times daily.   GAS-X PO Take 1-2 tablets by mouth 3 (three) times daily as needed (gas).   lamoTRIgine 200 MG tablet Commonly known as: LAMICTAL Take 200 mg by mouth every evening.   methenamine 1 g tablet Commonly known as: MANDELAMINE Take 1 tablet (1,000 mg total) by mouth 2 (two) times daily.   mirabegron ER 25 MG Tb24 tablet Commonly known as: MYRBETRIQ Take 1 tablet (25 mg total) by mouth daily.   naratriptan 2.5 MG tablet Commonly known as: AMERGE Take 2.5 mg by mouth as needed for migraine. Take one (1) tablet at onset of headache; if returns or does not resolve, may repeat after 4 hours; do  not exceed five (5) mg in 24 hours.   nitrofurantoin (macrocrystal-monohydrate) 100 MG capsule Commonly known as: MACROBID Take 1 capsule (100 mg total) by mouth every 12 (twelve) hours.   nitrofurantoin (macrocrystal-monohydrate) 100 MG capsule Commonly known as: MACROBID Take 1 capsule (100 mg total) by mouth 2 (two) times daily.   omeprazole 40 MG capsule Commonly known as: PRILOSEC Take 40 mg by mouth 2 (two) times daily.   OVER THE COUNTER MEDICATION Take 1 tablet by mouth daily. Bariatric Pal otc vitamin   oxyCODONE-acetaminophen 5-325 MG tablet Commonly known as: Percocet Take 1 tablet by mouth every 4 (four) hours as needed.   Potassium Citrate 15 MEQ (1620 MG) Tbcr Take 1 tablet by mouth 2 (two) times daily.   promethazine 25 MG tablet Commonly known as: PHENERGAN Take 1 tablet (25 mg total) by mouth 4 (four) times daily as needed for nausea or vomiting.   QUEtiapine 25 MG tablet Commonly known as: SEROQUEL Take 50 mg by mouth at bedtime.   rizatriptan 10 MG tablet Commonly known as: MAXALT Take 10 mg by mouth as needed for migraine.   rosuvastatin 5 MG tablet Commonly known as:  CRESTOR Take 5 mg by mouth daily.   sucralfate 1 g tablet Commonly known as: Carafate Take 1 tablet (1 g total) by mouth 4 (four) times daily.   tiZANidine 2 MG tablet Commonly known as: ZANAFLEX Take 2 mg by mouth 3 (three) times daily.   Toviaz 4 MG Tb24 tablet Generic drug: fesoterodine TAKE 1 TABLET(4 MG) BY MOUTH DAILY   Ubrelvy 100 MG Tabs Generic drug: Ubrogepant Take 100 mg by mouth daily as needed (migraines).        Allergies:  Allergies  Allergen Reactions   Penicillins Swelling and Rash    Rash all over [place] and lip swelling. Has patient had a PCN reaction causing immediate rash, facial/tongue/throat swelling, SOB or lightheadedness with hypotension: Yes Has patient had a PCN reaction causing severe rash involving mucus membranes or skin necrosis: Yes Has patient had a PCN reaction that required hospitalization: No If all of the above answers are "NO", then may proceed with Cephalosporin use.   Nortriptyline Other (See Comments)    Severe chest pain     Nsaids Other (See Comments)    Advised not to take due to gastric sleeve    Amoxicillin Rash    Rash all over [place]    Ciprofloxacin Diarrhea and Nausea Only    Family History: Family History  Problem Relation Age of Onset   Hypertension Mother    Melanoma Mother    Breast cancer Mother 86   Cancer - Other Father        liver   Diabetes Mellitus II Maternal Grandmother    Heart disease Maternal Grandmother    Breast cancer Maternal Grandmother    Cancer - Lung Maternal Grandfather    Osteoporosis Paternal Grandmother    Melanoma Paternal Grandfather     Social History:  reports that she has never smoked. She has never used smokeless tobacco. She reports that she does not drink alcohol and does not use drugs.  ROS: All other review of systems were reviewed and are negative except what is noted above in HPI  Physical Exam: BP 100/70    Pulse 82    Ht 6' (1.829 m)    Wt 220 lb (99.8 kg)    LMP  05/04/2015 (Approximate)    BMI 29.84 kg/m   Constitutional:  Alert  and oriented, No acute distress. HEENT: [place] AT,  Trachea midline Cardiovascular: No clubbing, cyanosis, or edema. Respiratory: Normal respiratory effort, no increased work of breathing. GI: Abdomen is soft, suprapubic and diffuse lower abdominal tenderness- no guarding, nondistended, no abdominal masses GU: Right sided CVA tenderness.  Skin: No rashes, bruises or suspicious lesions. Neurologic: Grossly intact, no focal deficits, moving all 4 extremities. Psychiatric: Normal mood and affect.  Laboratory Data: Lab Results  Component Value Date   WBC 11.0 (H) 07/09/2021   HGB 11.6 (L) 07/09/2021   HCT 35.1 (L) 07/09/2021   MCV 94.1 07/09/2021   PLT 167 07/09/2021    Lab Results  Component Value Date   CREATININE 0.78 09/20/2021     Urinalysis    Component Value Date/Time   COLORURINE YELLOW (A) 06/21/2017 1735   APPEARANCEUR Clear 09/03/2021 1044   LABSPEC 1.019 06/21/2017 1735   PHURINE 5.0 06/21/2017 1735   GLUCOSEU Trace (A) 09/03/2021 1044   HGBUR NEGATIVE 06/21/2017 1735   BILIRUBINUR Negative 09/03/2021 1044   KETONESUR NEGATIVE 06/21/2017 1735   PROTEINUR 1+ (A) 09/03/2021 1044   PROTEINUR NEGATIVE 06/21/2017 1735   UROBILINOGEN 0.2 01/27/2020 1124   UROBILINOGEN 1.0 05/18/2015 1359   NITRITE Positive (A) 09/03/2021 1044   NITRITE NEGATIVE 06/21/2017 1735   LEUKOCYTESUR 1+ (A) 09/03/2021 1044    Lab Results  Component Value Date   LABMICR See below: 09/03/2021   WBCUA >30 (A) 09/03/2021   LABEPIT >10 (A) 09/03/2021   BACTERIA Many (A) 09/03/2021    Pertinent Imaging: Narrative & Impression  CLINICAL DATA:  Right-sided flank pain and hematuria for 1 month. Nephrolithiasis.   EXAM: CT ABDOMEN AND PELVIS WITHOUT CONTRAST   TECHNIQUE: Multidetector CT imaging of the abdomen and pelvis was performed following the standard protocol without IV contrast.   RADIATION DOSE REDUCTION: This  exam was performed according to the departmental dose-optimization program which includes automated exposure control, adjustment of the mA and/or kV according to patient size and/or use of iterative reconstruction technique.   COMPARISON:  01/01/2020   FINDINGS: Lower chest: No acute findings.   Hepatobiliary: No mass visualized on this unenhanced exam. Gallbladder is unremarkable. No evidence of biliary ductal dilatation.   Pancreas: No mass or inflammatory process visualized on this unenhanced exam.   Spleen:  Within normal limits in size.   Adrenals/Urinary tract: Several tiny less than 5 mm renal calculi are seen bilaterally. No evidence of ureteral calculi or hydronephrosis. Unremarkable unopacified urinary bladder.   Stomach/Bowel: Postop changes again seen from previous subtotal colectomy and sleeve gastrectomy. No evidence of obstruction, inflammatory process, or abnormal fluid collections.   Vascular/Lymphatic: 14 x 11 mm mesenteric soft tissue nodule or lymph node is again seen in the right abdominal mesentery on image 64/2, stable since prior exam. No other pathologically enlarged lymph nodes identified. No evidence of abdominal aortic aneurysm.   Reproductive: Prior hysterectomy noted. Adnexal regions are unremarkable in appearance.   Other:  None.   Musculoskeletal: No suspicious bone lesions identified. Stable small sclerotic bone lesion in L4 vertebral body compared to previous CT in 2016, consistent with benign etiology.   IMPRESSION: Tiny bilateral renal calculi. No evidence of ureteral calculi, hydronephrosis, or other acute findings.   Stable small right abdominal mesenteric lymph node or soft tissue nodule. Recommend continued follow-up by CT in 6-12 months.     Electronically Signed   By: Marlaine Hind M.D.   On: 10/13/2021 12:38   Images reviewed by Dr. Alyson Ingles  and this provider. 2 minute intrarenal stones noted on the right.  No  hydronephrosis or evidence of ureteral stone or obstruction Results for orders placed during the hospital encounter of 11/27/15  DG Abd 1 View  Narrative CLINICAL DATA:  Constipation, Sitz mark study ; markers ingested 5 days ago, left upper quadrant and left lower quadrant abdominal pain.  EXAM: ABDOMEN - 1 VIEW  COMPARISON:  [facility] images of November 25, 2015  FINDINGS: There is a single Sitz marker that projects over the distal transverse colon. There are 3 Sitz markers that project in the pelvis likely in the sigmoid. The stool burden within the ascending and transverse colon is moderate. The descending and rectosigmoid colonic stool burden is relatively small. There is no small bowel obstruction. The bony structures are unremarkable.  IMPRESSION: There has been further progression of the Sitz markers. The colonic stool burden in the right and transverse colon is moderate.   Electronically Signed By: David  Martinique M.D. On: 11/27/2015 14:10  No results found for this or any previous visit.  No results found for this or any previous visit.  No results found for this or any previous visit.  Results for orders placed during the hospital encounter of 08/30/21  Ultrasound renal complete  Narrative CLINICAL DATA:  History of nephrolithiasis. History of prior nephrolithotomy and nephrostomy.  EXAM: RENAL / URINARY TRACT ULTRASOUND COMPLETE  COMPARISON:  IR study 07/05/2021.  CT 03/02/2020.  FINDINGS: Right Kidney:  Renal measurements: 17.3 x 4.5 x 6.2 cm = volume: 164.8 mL. Echogenicity within normal limits. No mass or hydronephrosis visualized. 5 mm nonobstructing lower pole calyceal stone.  Left Kidney:  Renal measurements: 11.1 x 5.3 x 5.1 cm = volume: 155.8 mL. Echogenicity within normal limits. No mass or hydronephrosis visualized.  Bladder:  The bladder is nondistended.  Other:  Exam slightly limited due to patient's body habitus and bowel  gas.  IMPRESSION: 5 mm nonobstructing tiny right renal lower pole calyceal stone. No hydronephrosis.   Electronically Signed By: Marcello Moores  Register M.D. On: 08/31/2021 05:17  No results found for this or any previous visit.  No results found for this or any previous visit.  Results for orders placed during the hospital encounter of 10/13/21  CT RENAL STONE STUDY  Narrative CLINICAL DATA:  Right-sided flank pain and hematuria for 1 month. Nephrolithiasis.  EXAM: CT ABDOMEN AND PELVIS WITHOUT CONTRAST  TECHNIQUE: Multidetector CT imaging of the abdomen and pelvis was performed following the standard protocol without IV contrast.  RADIATION DOSE REDUCTION: This exam was performed according to the departmental dose-optimization program which includes automated exposure control, adjustment of the mA and/or kV according to patient size and/or use of iterative reconstruction technique.  COMPARISON:  01/01/2020  FINDINGS: Lower chest: No acute findings.  Hepatobiliary: No mass visualized on this unenhanced exam. Gallbladder is unremarkable. No evidence of biliary ductal dilatation.  Pancreas: No mass or inflammatory process visualized on this unenhanced exam.  Spleen:  Within normal limits in size.  Adrenals/Urinary tract: Several tiny less than 5 mm renal calculi are seen bilaterally. No evidence of ureteral calculi or hydronephrosis. Unremarkable unopacified urinary bladder.  Stomach/Bowel: Postop changes again seen from previous subtotal colectomy and sleeve gastrectomy. No evidence of obstruction, inflammatory process, or abnormal fluid collections.  Vascular/Lymphatic: 14 x 11 mm mesenteric soft tissue nodule or lymph node is again seen in the right abdominal mesentery on image 64/2, stable since prior exam. No other pathologically enlarged lymph nodes identified. No evidence  of abdominal aortic aneurysm.  Reproductive: Prior hysterectomy noted. Adnexal  regions are unremarkable in appearance.  Other:  None.  Musculoskeletal: No suspicious bone lesions identified. Stable small sclerotic bone lesion in L4 vertebral body compared to previous CT in 2016, consistent with benign etiology.  IMPRESSION: Tiny bilateral renal calculi. No evidence of ureteral calculi, hydronephrosis, or other acute findings.  Stable small right abdominal mesenteric lymph node or soft tissue nodule. Recommend continued follow-up by CT in 6-12 months.   Electronically Signed By: Marlaine Hind M.D. On: 10/13/2021 12:38   Assessment & Plan:   1. Urinary frequency - BLADDER SCAN AMB NON-IMAGING - Urinalysis, Routine w reflex microscopic  2. Glucosuria - Hemoglobin A1c  3. Kidney stones  4. Abdominal pain, unspecified abdominal location  5. Chronic cystitis with hematuria   Discussed findings and plan of care at length with the patient.  Prescription for Macrobid sent to the patient's pharmacy and [facility] the culture sent for further evaluation.  Will change or adjust therapy pending culture results.  Recommended patient follow-up with her primary care provider soon as possible to address glucosuria and elevated blood glucose levels.  She will continue stone prevention as discussed.  She will keep her appointment for follow-up with McKenzie as previously scheduled.  No follow-ups on file.  Summerlin, Berneice Heinrich, PA-C  [facility] [place]

## 2021-10-13 NOTE — Progress Notes (Signed)
[facility] Urine select collected   [employer] pick up scheduled Tracking Number [id number]  Pick up number  [id number]

## 2021-10-13 NOTE — Progress Notes (Signed)
Urological Symptom Review  Patient is experiencing the following symptoms: Frequent urination Burning/pain with urination Get up at night to urinate Have to strain to urinate Blood in urine Weak stream   Review of Systems  Gastrointestinal (upper)  : Nausea  Gastrointestinal (lower) : Negative for lower GI symptoms  Constitutional : Fatigue  Skin: Negative for skin symptoms  Eyes: Negative for eye symptoms  Ear/Nose/Throat : Negative for Ear/Nose/Throat symptoms  Hematologic/Lymphatic: Negative for Hematologic/Lymphatic symptoms  Cardiovascular : Leg swelling  Respiratory : Negative for respiratory symptoms  Endocrine: Negative for endocrine symptoms  Musculoskeletal: Back pain  Neurological: Headaches  Psychologic: Negative for psychiatric symptoms

## 2021-10-13 NOTE — Progress Notes (Signed)
post void residual= 65ml

## 2021-10-13 NOTE — Telephone Encounter (Signed)
Patient called this am with complaints of still feeling like she has UTI. Reviewed with Dr. Alyson Ingles patient last urine cultures and symptoms. Patient will get STAT CT and then come to office and will send urine off for [facility] urine culture. Patient will see PA today for symptoms.

## 2021-10-14 LAB — URINALYSIS, ROUTINE W REFLEX MICROSCOPIC
Bilirubin, UA: NEGATIVE
Ketones, UA: NEGATIVE
Nitrite, UA: POSITIVE — AB
Specific Gravity, UA: 1.02 (ref 1.005–1.030)
Urobilinogen, Ur: 1 mg/dL (ref 0.2–1.0)
pH, UA: 5 (ref 5.0–7.5)

## 2021-10-14 LAB — HEMOGLOBIN A1C
Est. average glucose Bld gHb Est-mCnc: 105 mg/dL
Hgb A1c MFr Bld: 5.3 % (ref 4.8–5.6)

## 2021-10-14 LAB — MICROSCOPIC EXAMINATION: Renal Epithel, UA: NONE SEEN /hpf

## 2021-10-18 ENCOUNTER — Other Ambulatory Visit: Payer: Self-pay

## 2021-10-18 ENCOUNTER — Telehealth: Payer: Self-pay

## 2021-10-18 ENCOUNTER — Other Ambulatory Visit: Payer: Self-pay | Admitting: Physician Assistant

## 2021-10-18 DIAGNOSIS — N3021 Other chronic cystitis with hematuria: Secondary | ICD-10-CM

## 2021-10-18 MED ORDER — PHENAZOPYRIDINE HCL 200 MG PO TABS: 200.0000 mg | ORAL_TABLET | Freq: Three times a day (TID) | ORAL | 0 refills | Status: DC | PRN

## 2021-10-18 MED ORDER — LEVOFLOXACIN 500 MG PO TABS: 500.0000 mg | ORAL_TABLET | Freq: Every day | ORAL | 0 refills | Status: AC

## 2021-10-18 MED ORDER — FLUCONAZOLE 150 MG PO TABS: 150.0000 mg | ORAL_TABLET | Freq: Once | ORAL | 0 refills | Status: AC

## 2021-10-18 NOTE — Telephone Encounter (Signed)
Patient called and made aware.  Patient request to take the Levaquin and another diflucan to prevent yeast infection. Orpah Clinton PA made aware.

## 2021-10-18 NOTE — Progress Notes (Signed)
Patient left message c/o of pain and discomfort due to urinary issues. Symptoms reviewed with J. Summerlin PA. Rx for Pyridium sent. Patient called and made aware.

## 2021-10-18 NOTE — Telephone Encounter (Signed)
-----   Message from Reynaldo Minium, [place] sent at 10/18/2021  4:02 PM EST ----- Culture indicates resistance to most antibx.  There are 2 options: Levaquin is the only antibiotic by mouth that the sensitivities indicate would work.  There is 1 other option, but there may be resistance to it.  Levaquin is similar to Cipro, but it is only once a day for 5 days. ----- Message ----- From: Iris Pert, LPN Sent: 7/62/8315   3:47 PM EST To: Berneice Heinrich Summerlin, PA-C  [facility] result Started on [employer]

## 2021-10-26 DIAGNOSIS — M533 Sacrococcygeal disorders, not elsewhere classified: Secondary | ICD-10-CM | POA: Diagnosis not present

## 2021-10-26 DIAGNOSIS — R0789 Other chest pain: Secondary | ICD-10-CM | POA: Diagnosis not present

## 2021-10-26 DIAGNOSIS — M797 Fibromyalgia: Secondary | ICD-10-CM | POA: Diagnosis not present

## 2021-10-26 DIAGNOSIS — M79644 Pain in right finger(s): Secondary | ICD-10-CM | POA: Diagnosis not present

## 2021-10-26 DIAGNOSIS — M5441 Lumbago with sciatica, right side: Secondary | ICD-10-CM | POA: Diagnosis not present

## 2021-10-26 DIAGNOSIS — G894 Chronic pain syndrome: Secondary | ICD-10-CM | POA: Diagnosis not present

## 2021-10-28 ENCOUNTER — Other Ambulatory Visit: Payer: Self-pay

## 2021-10-28 DIAGNOSIS — R350 Frequency of micturition: Secondary | ICD-10-CM

## 2021-10-28 DIAGNOSIS — Z0100 Encounter for examination of eyes and vision without abnormal findings: Secondary | ICD-10-CM | POA: Diagnosis not present

## 2021-10-28 DIAGNOSIS — H5210 Myopia, unspecified eye: Secondary | ICD-10-CM | POA: Diagnosis not present

## 2021-10-28 DIAGNOSIS — H5213 Myopia, bilateral: Secondary | ICD-10-CM | POA: Diagnosis not present

## 2021-10-28 DIAGNOSIS — N200 Calculus of kidney: Secondary | ICD-10-CM

## 2021-10-28 MED ORDER — ALLOPURINOL 300 MG PO TABS: 300.0000 mg | ORAL_TABLET | Freq: Every day | ORAL | 3 refills | Status: DC

## 2021-10-28 MED ORDER — POTASSIUM CITRATE ER 15 MEQ (1620 MG) PO TBCR: 1.0000 | EXTENDED_RELEASE_TABLET | Freq: Two times a day (BID) | ORAL | 11 refills | Status: DC

## 2021-10-28 MED ORDER — MIRABEGRON ER 25 MG PO TB24: 25.0000 mg | ORAL_TABLET | Freq: Every day | ORAL | 3 refills | Status: DC

## 2021-10-28 NOTE — Progress Notes (Signed)
Prescriptions sent to mail order

## 2021-10-29 DIAGNOSIS — F331 Major depressive disorder, recurrent, moderate: Secondary | ICD-10-CM | POA: Diagnosis not present

## 2021-10-29 DIAGNOSIS — F411 Generalized anxiety disorder: Secondary | ICD-10-CM | POA: Diagnosis not present

## 2021-10-29 DIAGNOSIS — G2581 Restless legs syndrome: Secondary | ICD-10-CM | POA: Diagnosis not present

## 2021-10-29 DIAGNOSIS — F4310 Post-traumatic stress disorder, unspecified: Secondary | ICD-10-CM | POA: Diagnosis not present

## 2021-11-20 DIAGNOSIS — N200 Calculus of kidney: Secondary | ICD-10-CM | POA: Diagnosis not present

## 2021-11-29 ENCOUNTER — Other Ambulatory Visit: Payer: Self-pay | Admitting: Urology

## 2021-12-03 ENCOUNTER — Other Ambulatory Visit: Payer: Self-pay

## 2021-12-03 ENCOUNTER — Ambulatory Visit: Payer: Medicare HMO | Admitting: Urology

## 2021-12-03 ENCOUNTER — Encounter: Payer: Self-pay | Admitting: Urology

## 2021-12-03 VITALS — BP 93/63 | HR 88

## 2021-12-03 DIAGNOSIS — N200 Calculus of kidney: Secondary | ICD-10-CM

## 2021-12-03 DIAGNOSIS — N3021 Other chronic cystitis with hematuria: Secondary | ICD-10-CM

## 2021-12-03 DIAGNOSIS — R350 Frequency of micturition: Secondary | ICD-10-CM | POA: Diagnosis not present

## 2021-12-03 LAB — URINALYSIS, ROUTINE W REFLEX MICROSCOPIC
Bilirubin, UA: NEGATIVE
Nitrite, UA: NEGATIVE
Specific Gravity, UA: >1.03 — ABNORMAL HIGH (ref 1.005–1.030)
Urobilinogen, Ur: 0.2 mg/dL (ref 0.2–1.0)
pH, UA: 5.5 (ref 5.0–7.5)

## 2021-12-03 LAB — MICROSCOPIC EXAMINATION: Renal Epithel, UA: NONE SEEN /hpf

## 2021-12-03 MED ORDER — DOXYCYCLINE HYCLATE 100 MG PO CAPS
100.0000 mg | ORAL_CAPSULE | Freq: Two times a day (BID) | ORAL | 0 refills | Status: DC
Start: 2021-12-03 — End: 2021-12-09

## 2021-12-03 NOTE — Patient Instructions (Signed)
Dietary Guidelines to Help Prevent Kidney Stones Kidney stones are deposits of minerals and salts that form inside your kidneys. Your risk of developing kidney stones may be greater depending on your diet, your lifestyle, the medicines you take, and whether you have certain medical conditions. Most people can lower their chances of developing kidney stones by following the instructions below. Your dietitian may give you more specific instructions depending on your overall health and the type of kidney stones you tend to develop. What are tips for following this plan? Reading food labels  Choose foods with "no salt added" or "low-salt" labels. Limit your salt (sodium) intake to less than 1,500 mg a day. Choose foods with calcium for each meal and snack. Try to eat about 300 mg of calcium at each meal. Foods that contain 200-500 mg of calcium a serving include: 8 oz (237 mL) of milk, calcium-fortifiednon-dairy milk, and calcium-fortifiedfruit juice. Calcium-fortified means that calcium has been added to these drinks. 8 oz (237 mL) of kefir, yogurt, and soy yogurt. 4 oz (114 g) of tofu. 1 oz (28 g) of cheese. 1 cup (150 g) of dried figs. 1 cup (91 g) of cooked broccoli. One 3 oz (85 g) can of sardines or mackerel. Most people need 1,000-1,500 mg of calcium a day. Talk to your dietitian about how much calcium is recommended for you. Shopping Buy plenty of fresh fruits and vegetables. Most people do not need to avoid fruits and vegetables, even if these foods contain nutrients that may contribute to kidney stones. When shopping for convenience foods, choose: Whole pieces of fruit. Pre-made salads with dressing on the side. Low-fat fruit and yogurt smoothies. Avoid buying frozen meals or prepared deli foods. These can be high in sodium. Look for foods with live cultures, such as yogurt and kefir. Choose high-fiber grains, such as whole-wheat breads, oat bran, and wheat cereals. Cooking Do not add  salt to food when cooking. Place a salt shaker on the table and allow each person to add his or her own salt to taste. Use vegetable protein, such as beans, textured vegetable protein (TVP), or tofu, instead of meat in pasta, casseroles, and soups. Meal planning Eat less salt, if told by your dietitian. To do this: Avoid eating processed or pre-made food. Avoid eating fast food. Eat less animal protein, including cheese, meat, poultry, or fish, if told by your dietitian. To do this: Limit the number of times you have meat, poultry, fish, or cheese each week. Eat a diet free of meat at least 2 days a week. Eat only one serving each day of meat, poultry, fish, or seafood. When you prepare animal protein, cut pieces into small portion sizes. For most meat and fish, one serving is about the size of the palm of your hand. Eat at least five servings of fresh fruits and vegetables each day. To do this: Keep fruits and vegetables on hand for snacks. Eat one piece of fruit or a handful of berries with breakfast. Have a salad and fruit at lunch. Have two kinds of vegetables at dinner. Limit foods that are high in a substance called oxalate. These include: Spinach (cooked), rhubarb, beets, sweet potatoes, and Swiss chard. Peanuts. Potato chips, french fries, and baked potatoes with skin on. Nuts and nut products. Chocolate. If you regularly take a diuretic medicine, make sure to eat at least 1 or 2 servings of fruits or vegetables that are high in potassium each day. These include: Avocado. Banana. Orange, prune,   carrot, or tomato juice. Baked potato. Cabbage. Beans and split peas. Lifestyle  Drink enough fluid to keep your urine pale yellow. This is the most important thing you can do. Spread your fluid intake throughout the day. If you drink alcohol: Limit how much you use to: 0-1 drink a day for women who are not pregnant. 0-2 drinks a day for men. Be aware of how much alcohol is in your  drink. In the [place] one drink equals one 12 oz bottle of beer (355 mL), one 5 oz glass of wine (148 mL), or one 1 oz glass of hard liquor (44 mL). Lose weight if told by your health care provider. Work with your dietitian to find an eating plan and weight loss strategies that work best for you. General information Talk to your health care provider and dietitian about taking daily supplements. You may be told the following depending on your health and the cause of your kidney stones: Not to take supplements with vitamin C. To take a calcium supplement. To take a daily probiotic supplement. To take other supplements such as magnesium, fish oil, or vitamin B6. Take over-the-counter and prescription medicines only as told by your health care provider. These include supplements. What foods should I limit? Limit your intake of the following foods, or eat them as told by your dietitian. Vegetables Spinach. Rhubarb. Beets. Canned vegetables. Pickles. Olives. Baked potatoes with skin. Grains Wheat bran. Baked goods. Salted crackers. Cereals high in sugar. Meats and other proteins Nuts. Nut butters. Large portions of meat, poultry, or fish. Salted, precooked, or cured meats, such as sausages, meat loaves, and hot dogs. Dairy Cheese. Beverages Regular soft drinks. Regular vegetable juice. Seasonings and condiments Seasoning blends with salt. Salad dressings. Soy sauce. Ketchup. Barbecue sauce. Other foods Canned soups. Canned pasta sauce. Casseroles. Pizza. Lasagna. Frozen meals. Potato chips. French fries. The items listed above may not be a complete list of foods and beverages you should limit. Contact a dietitian for more information. What foods should I avoid? Talk to your dietitian about specific foods you should avoid based on the type of kidney stones you have and your overall health. Fruits Grapefruit. The item listed above may not be a complete list of foods and beverages you should  avoid. Contact a dietitian for more information. Summary Kidney stones are deposits of minerals and salts that form inside your kidneys. You can lower your risk of kidney stones by making changes to your diet. The most important thing you can do is drink enough fluid. Drink enough fluid to keep your urine pale yellow. Talk to your dietitian about how much calcium you should have each day, and eat less salt and animal protein as told by your dietitian. This information is not intended to replace advice given to you by your health care provider. Make sure you discuss any questions you have with your health care provider. Document Revised: 09/12/2019 Document Reviewed: 09/12/2019 Elsevier Patient Education  2022 Elsevier Inc.  

## 2021-12-03 NOTE — Progress Notes (Signed)
12/03/2021 12:13 PM   Denise Avila Apr 16, 1976 [id number]  Referring provider: Lennie Odor, [place] [employer] [place],   [id number]  Followup UTI and nephrolithiasis   HPI: Denise Avila is a 46yo here for followup for recurrent UTI and nepholithiasis. UA today is concerning for infection. She has urinary urgency, frequency and occasional dysuria. 24 hour urine shows low volume 1L, very low citrate, normal calcium. She previously took UrocitK and she became hyperKalemic. She has known right lower pole calculi. No other complaints today.    PMH: Past Medical History:  Diagnosis Date   Abnormal Pap smear of cervix 12/2014   Acid reflux    Adrenal insufficiency (HCC)    Anxiety    Arthritis    Arthritis    Depression    Depression    Endometriosis    Fibroids    age 22   High cholesterol    History of kidney stones    Liver disease    per pt report, will have biopsy Nov 08, 2017   Migraines    Pleurisy 02/2015   while hospitalized    Seizure ([place])    on meds and no seizures in 2-3 years. Unknown etiology   Skin cancer (melanoma) ([place])    Syncope and collapse    Uterine cancer [facility])     Surgical History: Past Surgical History:  Procedure Laterality Date   ABDOMINAL HYSTERECTOMY     ABLATION     uterine   COLECTOMY     CYSTOSCOPY W/ URETERAL STENT PLACEMENT Left 02/02/2015   Procedure: CYSTOSCOPY WITH RETROGRADE PYELOGRAM/URETERAL STENT PLACEMENT;  Surgeon: Festus Aloe, MD;  Location: [facility];  Service: Urology;  Laterality: Left;   CYSTOSCOPY WITH URETEROSCOPY AND STENT PLACEMENT Left 02/13/2015   Procedure: LEFT URETEROSCOPY WITH HOLMIUM LASER AND STENT PLACEMENT;  Surgeon: Festus Aloe, MD;  Location: [facility];  Service: Urology;  Laterality: Left;   DILATION AND CURETTAGE OF UTERUS     ESOPHAGOGASTRODUODENOSCOPY ENDOSCOPY  10/11/2017   @ [facility]; BIOPSY OF LOWER INTESTINE   HOLMIUM LASER APPLICATION N/A 00/76/2263   Procedure: HOLMIUM  LASER APPLICATION;  Surgeon: Festus Aloe, MD;  Location: [facility];  Service: Urology;  Laterality: N/A;   IR NEPHROSTOMY PLACEMENT RIGHT  07/05/2021   KNEE SURGERY Bilateral 2019   realignment of knees   LAPAROSCOPIC GASTRIC SLEEVE RESECTION     LAPAROSCOPY     x 4   MELANOMA EXCISION  2000   melanoma removed from face, basal cell removed from nose   NEPHROLITHOTOMY Right 07/08/2021   Procedure: NEPHROLITHOTOMY PERCUTANEOUS- with stent placement;  Surgeon: Cleon Gustin, MD;  Location: [facility];  Service: Urology;  Laterality: Right;   REPAIR VAGINAL CUFF N/A 08/21/2015   Procedure: REPAIR VAGINAL CUFF, EXAM UNDER ANESTHESIA;  Surgeon: Everitt Amber, MD;  Location: [facility];  Service: Gynecology;  Laterality: N/A;   ROBOTIC ASSISTED TOTAL HYSTERECTOMY WITH BILATERAL SALPINGO OOPHERECTOMY Bilateral 05/21/2015   Procedure: XI ROBOTIC ASSISTED TOTAL HYSTERECTOMY WITH BILATERAL SALPINGO OOPHORECTOMY;  Surgeon: Everitt Amber, MD;  Location: [facility];  Service: Gynecology;  Laterality: Bilateral;    Home Medications:  Allergies as of 12/03/2021       Reactions   Penicillins Swelling, Rash   Rash all over [place] and lip swelling. Has patient had a PCN reaction causing immediate rash, facial/tongue/throat swelling, SOB or lightheadedness with hypotension: Yes Has patient had a PCN reaction causing severe rash involving mucus membranes or skin necrosis: Yes Has patient  had a PCN reaction that required hospitalization: No If all of the above answers are "NO", then may proceed with Cephalosporin use.   Nortriptyline Other (See Comments)   Severe chest pain    Nsaids Other (See Comments)   Advised not to take due to gastric sleeve    Amoxicillin Rash   Rash all over [place]   Ciprofloxacin Diarrhea, Nausea Only        Medication List        Accurate as of December 03, 2021 12:13 PM. If you have any questions, ask your nurse or doctor.          allopurinol 300 MG tablet Commonly known as:  Zyloprim Take 1 tablet (300 mg total) by mouth daily.   baclofen 10 MG tablet Commonly known as: LIORESAL Take 10 mg by mouth 3 (three) times daily.   celecoxib 200 MG capsule Commonly known as: CELEBREX Take 200 mg by mouth daily.   citalopram 10 MG tablet Commonly known as: CELEXA Take 10 mg by mouth daily.   clonazePAM 1 MG tablet Commonly known as: KLONOPIN Take 1 mg by mouth daily.   diphenhydrAMINE 25 MG tablet Commonly known as: BENADRYL Take 25 mg by mouth every 6 (six) hours as needed (take with promethazine for migraines).   doxycycline 100 MG capsule Commonly known as: VIBRAMYCIN Take 1 capsule (100 mg total) by mouth every 12 (twelve) hours. Started by: Nicolette Bang, MD   DULoxetine 60 MG capsule Commonly known as: CYMBALTA Take 60 mg by mouth at bedtime.   DULoxetine 30 MG capsule Commonly known as: CYMBALTA Take 30 mg by mouth daily.   ezetimibe 10 MG tablet Commonly known as: ZETIA Take 10 mg by mouth daily.   gabapentin 800 MG tablet Commonly known as: NEURONTIN Take 1,600 tablets by mouth 2 (two) times daily.   GAS-X PO Take 1-2 tablets by mouth 3 (three) times daily as needed (gas).   lamoTRIgine 200 MG tablet Commonly known as: LAMICTAL Take 200 mg by mouth every evening.   methenamine 1 g tablet Commonly known as: MANDELAMINE Take 1 tablet (1,000 mg total) by mouth 2 (two) times daily.   mirabegron ER 25 MG Tb24 tablet Commonly known as: MYRBETRIQ Take 1 tablet (25 mg total) by mouth daily.   naratriptan 2.5 MG tablet Commonly known as: AMERGE Take 2.5 mg by mouth as needed for migraine. Take one (1) tablet at onset of headache; if returns or does not resolve, may repeat after 4 hours; do not exceed five (5) mg in 24 hours.   omeprazole 40 MG capsule Commonly known as: PRILOSEC Take 40 mg by mouth 2 (two) times daily.   OVER THE COUNTER MEDICATION Take 1 tablet by mouth daily. Bariatric Pal otc vitamin    oxyCODONE-acetaminophen 5-325 MG tablet Commonly known as: Percocet Take 1 tablet by mouth every 4 (four) hours as needed.   phenazopyridine 200 MG tablet Commonly known as: Pyridium Take 1 tablet (200 mg total) by mouth 3 (three) times daily as needed for pain.   Potassium Citrate 15 MEQ (1620 MG) Tbcr Take 1 tablet by mouth 2 (two) times daily.   promethazine 25 MG tablet Commonly known as: PHENERGAN Take 1 tablet (25 mg total) by mouth 4 (four) times daily as needed for nausea or vomiting.   QUEtiapine 25 MG tablet Commonly known as: SEROQUEL Take 50 mg by mouth at bedtime.   rizatriptan 10 MG tablet Commonly known as: MAXALT Take 10 mg by mouth  as needed for migraine.   rosuvastatin 5 MG tablet Commonly known as: CRESTOR Take 5 mg by mouth daily.   sucralfate 1 g tablet Commonly known as: Carafate Take 1 tablet (1 g total) by mouth 4 (four) times daily.   tiZANidine 2 MG tablet Commonly known as: ZANAFLEX Take 2 mg by mouth 3 (three) times daily.   Toviaz 4 MG Tb24 tablet Generic drug: fesoterodine TAKE 1 TABLET(4 MG) BY MOUTH DAILY   Ubrelvy 100 MG Tabs Generic drug: Ubrogepant Take 100 mg by mouth daily as needed (migraines).        Allergies:  Allergies  Allergen Reactions   Penicillins Swelling and Rash    Rash all over [place] and lip swelling. Has patient had a PCN reaction causing immediate rash, facial/tongue/throat swelling, SOB or lightheadedness with hypotension: Yes Has patient had a PCN reaction causing severe rash involving mucus membranes or skin necrosis: Yes Has patient had a PCN reaction that required hospitalization: No If all of the above answers are "NO", then may proceed with Cephalosporin use.   Nortriptyline Other (See Comments)    Severe chest pain     Nsaids Other (See Comments)    Advised not to take due to gastric sleeve    Amoxicillin Rash    Rash all over [place]    Ciprofloxacin Diarrhea and Nausea Only    Family  History: Family History  Problem Relation Age of Onset   Hypertension Mother    Melanoma Mother    Breast cancer Mother 35   Cancer - Other Father        liver   Diabetes Mellitus II Maternal Grandmother    Heart disease Maternal Grandmother    Breast cancer Maternal Grandmother    Cancer - Lung Maternal Grandfather    Osteoporosis Paternal Grandmother    Melanoma Paternal Grandfather     Social History:  reports that she has never smoked. She has never used smokeless tobacco. She reports that she does not drink alcohol and does not use drugs.  ROS: All other review of systems were reviewed and are negative except what is noted above in HPI  Physical Exam: BP 93/63    Pulse 88    LMP 05/04/2015 (Approximate)   Constitutional:  Alert and oriented, No acute distress. HEENT: [place] AT, moist mucus membranes.  Trachea midline, no masses. Cardiovascular: No clubbing, cyanosis, or edema. Respiratory: Normal respiratory effort, no increased work of breathing. GI: Abdomen is soft, nontender, nondistended, no abdominal masses GU: No CVA tenderness.  Lymph: No cervical or inguinal lymphadenopathy. Skin: No rashes, bruises or suspicious lesions. Neurologic: Grossly intact, no focal deficits, moving all 4 extremities. Psychiatric: Normal mood and affect.  Laboratory Data: Lab Results  Component Value Date   WBC 11.0 (H) 07/09/2021   HGB 11.6 (L) 07/09/2021   HCT 35.1 (L) 07/09/2021   MCV 94.1 07/09/2021   PLT 167 07/09/2021    Lab Results  Component Value Date   CREATININE 0.78 09/20/2021    No results found for: PSA  No results found for: TESTOSTERONE  Lab Results  Component Value Date   HGBA1C 5.3 10/13/2021    Urinalysis    Component Value Date/Time   COLORURINE YELLOW (A) 06/21/2017 1735   APPEARANCEUR Clear 10/13/2021 1547   LABSPEC 1.019 06/21/2017 1735   PHURINE 5.0 06/21/2017 1735   GLUCOSEU Trace (A) 10/13/2021 1547   HGBUR NEGATIVE 06/21/2017 1735    BILIRUBINUR Negative 10/13/2021 1547   KETONESUR NEGATIVE 06/21/2017  1735   PROTEINUR 1+ (A) 10/13/2021 1547   PROTEINUR NEGATIVE 06/21/2017 1735   UROBILINOGEN 0.2 01/27/2020 1124   UROBILINOGEN 1.0 05/18/2015 1359   NITRITE Positive (A) 10/13/2021 1547   NITRITE NEGATIVE 06/21/2017 1735   LEUKOCYTESUR 1+ (A) 10/13/2021 1547    Lab Results  Component Value Date   LABMICR See below: 10/13/2021   WBCUA 11-30 (A) 10/13/2021   LABEPIT 0-10 10/13/2021   MUCUS Present 10/13/2021   BACTERIA Many (A) 10/13/2021    Pertinent Imaging: Ct 10/13/2021: Images reviewed and discussed with the patient  Results for orders placed during the hospital encounter of 11/27/15  DG Abd 1 View  Narrative CLINICAL DATA:  Constipation, Sitz mark study ; markers ingested 5 days ago, left upper quadrant and left lower quadrant abdominal pain.  EXAM: ABDOMEN - 1 VIEW  COMPARISON:  KUB images of November 25, 2015  FINDINGS: There is a single Sitz marker that projects over the distal transverse colon. There are 3 Sitz markers that project in the pelvis likely in the sigmoid. The stool burden within the ascending and transverse colon is moderate. The descending and rectosigmoid colonic stool burden is relatively small. There is no small bowel obstruction. The bony structures are unremarkable.  IMPRESSION: There has been further progression of the Sitz markers. The colonic stool burden in the right and transverse colon is moderate.   Electronically Signed By: David  Martinique M.D. On: 11/27/2015 14:10  No results found for this or any previous visit.  No results found for this or any previous visit.  No results found for this or any previous visit.  Results for orders placed during the hospital encounter of 08/30/21  Ultrasound renal complete  Narrative CLINICAL DATA:  History of nephrolithiasis. History of prior nephrolithotomy and nephrostomy.  EXAM: RENAL / URINARY TRACT ULTRASOUND  COMPLETE  COMPARISON:  IR study 07/05/2021.  CT 03/02/2020.  FINDINGS: Right Kidney:  Renal measurements: 17.3 x 4.5 x 6.2 cm = volume: 164.8 mL. Echogenicity within normal limits. No mass or hydronephrosis visualized. 5 mm nonobstructing lower pole calyceal stone.  Left Kidney:  Renal measurements: 11.1 x 5.3 x 5.1 cm = volume: 155.8 mL. Echogenicity within normal limits. No mass or hydronephrosis visualized.  Bladder:  The bladder is nondistended.  Other:  Exam slightly limited due to patient's body habitus and bowel gas.  IMPRESSION: 5 mm nonobstructing tiny right renal lower pole calyceal stone. No hydronephrosis.   Electronically Signed By: Marcello Moores  Register M.D. On: 08/31/2021 05:17  No results found for this or any previous visit.  No results found for this or any previous visit.  Results for orders placed during the hospital encounter of 10/13/21  CT RENAL STONE STUDY  Narrative CLINICAL DATA:  Right-sided flank pain and hematuria for 1 month. Nephrolithiasis.  EXAM: CT ABDOMEN AND PELVIS WITHOUT CONTRAST  TECHNIQUE: Multidetector CT imaging of the abdomen and pelvis was performed following the standard protocol without IV contrast.  RADIATION DOSE REDUCTION: This exam was performed according to the departmental dose-optimization program which includes automated exposure control, adjustment of the mA and/or kV according to patient size and/or use of iterative reconstruction technique.  COMPARISON:  01/01/2020  FINDINGS: Lower chest: No acute findings.  Hepatobiliary: No mass visualized on this unenhanced exam. Gallbladder is unremarkable. No evidence of biliary ductal dilatation.  Pancreas: No mass or inflammatory process visualized on this unenhanced exam.  Spleen:  Within normal limits in size.  Adrenals/Urinary tract: Several tiny less than 5 mm  renal calculi are seen bilaterally. No evidence of ureteral calculi or hydronephrosis.  Unremarkable unopacified urinary bladder.  Stomach/Bowel: Postop changes again seen from previous subtotal colectomy and sleeve gastrectomy. No evidence of obstruction, inflammatory process, or abnormal fluid collections.  Vascular/Lymphatic: 14 x 11 mm mesenteric soft tissue nodule or lymph node is again seen in the right abdominal mesentery on image 64/2, stable since prior exam. No other pathologically enlarged lymph nodes identified. No evidence of abdominal aortic aneurysm.  Reproductive: Prior hysterectomy noted. Adnexal regions are unremarkable in appearance.  Other:  None.  Musculoskeletal: No suspicious bone lesions identified. Stable small sclerotic bone lesion in L4 vertebral body compared to previous CT in 2016, consistent with benign etiology.  IMPRESSION: Tiny bilateral renal calculi. No evidence of ureteral calculi, hydronephrosis, or other acute findings.  Stable small right abdominal mesenteric lymph node or soft tissue nodule. Recommend continued follow-up by CT in 6-12 months.   Electronically Signed By: Marlaine Hind M.D. On: 10/13/2021 12:38   Assessment & Plan:    1. Kidney stones -We discussed the management of kidney stones. These options include observation, ureteroscopy, shockwave lithotripsy (ESWL) and percutaneous nephrolithotomy (PCNL). We discussed which options are relevant to the patient's stone(s). We discussed the natural history of kidney stones as well as the complications of untreated stones and the impact on quality of life without treatment as well as with each of the above listed treatments. We also discussed the efficacy of each treatment in its ability to clear the stone burden. With any of these management options I discussed the signs and symptoms of infection and the need for emergent treatment should these be experienced. For each option we discussed the ability of each procedure to clear the patient of their stone burden.   For  observation I described the risks which include but are not limited to silent renal damage, life-threatening infection, need for emergent surgery, failure to pass stone and pain.   For ureteroscopy I described the risks which include bleeding, infection, damage to contiguous structures, positioning injury, ureteral stricture, ureteral avulsion, ureteral injury, need for prolonged ureteral stent, inability to perform ureteroscopy, need for an interval procedure, inability to clear stone burden, stent discomfort/pain, heart attack, stroke, pulmonary embolus and the inherent risks with general anesthesia.   For shockwave lithotripsy I described the risks which include arrhythmia, kidney contusion, kidney hemorrhage, need for transfusion, pain, inability to adequately break up stone, inability to pass stone fragments, Steinstrasse, infection associated with obstructing stones, need for alternate surgical procedure, need for repeat shockwave lithotripsy, MI, CVA, PE and the inherent risks with anesthesia/conscious sedation.   For PCNL I described the risks including positioning injury, pneumothorax, hydrothorax, need for chest tube, inability to clear stone burden, renal laceration, arterial venous fistula or malformation, need for embolization of kidney, loss of kidney or renal function, need for repeat procedure, need for prolonged nephrostomy tube, ureteral avulsion, MI, CVA, PE and the inherent risks of general anesthesia.   - The patient would like to proceed with right ureteroscopic stone extraction - Urinalysis, Routine w reflex microscopic  2. Chronic cystitis with hematuria -doxycycline 100mg  BID for 7 days - Urine Culture  3. Urinary frequency Continue mirabegron 25mg  daily   No follow-ups on file.  Nicolette Bang, MD  [facility] [place]

## 2021-12-08 LAB — URINE CULTURE

## 2021-12-09 ENCOUNTER — Other Ambulatory Visit: Payer: Self-pay

## 2021-12-09 ENCOUNTER — Telehealth: Payer: Self-pay

## 2021-12-09 MED ORDER — SULFAMETHOXAZOLE-TRIMETHOPRIM 800-160 MG PO TABS: 1.0000 | ORAL_TABLET | Freq: Two times a day (BID) | ORAL | 0 refills | Status: DC

## 2021-12-09 MED ORDER — NITROFURANTOIN MACROCRYSTAL 50 MG PO CAPS: 50.0000 mg | ORAL_CAPSULE | Freq: Every day | ORAL | 2 refills | Status: DC

## 2021-12-09 NOTE — Telephone Encounter (Signed)
Patient reports UTI symptoms have not improved despite being on doxycycline ? ?New antibiotic received from Dr.McKenzie for bactrim DS. Rx sent in to pharmacy. Patient called and notified  ?

## 2021-12-20 ENCOUNTER — Other Ambulatory Visit: Payer: Self-pay

## 2021-12-20 DIAGNOSIS — N200 Calculus of kidney: Secondary | ICD-10-CM

## 2021-12-20 MED ORDER — FLUCONAZOLE 150 MG PO TABS: 150.0000 mg | ORAL_TABLET | Freq: Every day | ORAL | 0 refills | Status: DC

## 2022-01-03 ENCOUNTER — Other Ambulatory Visit: Payer: Self-pay

## 2022-01-03 MED ORDER — OXYCODONE-ACETAMINOPHEN 5-325 MG PO TABS: 1.0000 | ORAL_TABLET | ORAL | 0 refills | Status: DC | PRN

## 2022-01-03 NOTE — Telephone Encounter (Signed)
Patient called this am. Reports increase in flank pain. ? ?Scheduled for surgery on 04/20 ? ?Patient unable to take NSAIDS due to recent gastric surgery. Patient requesting pain medication refill. ? ?Message sent to MD ?

## 2022-01-04 DIAGNOSIS — M5412 Radiculopathy, cervical region: Secondary | ICD-10-CM | POA: Diagnosis not present

## 2022-01-04 DIAGNOSIS — F331 Major depressive disorder, recurrent, moderate: Secondary | ICD-10-CM | POA: Diagnosis not present

## 2022-01-04 DIAGNOSIS — M533 Sacrococcygeal disorders, not elsewhere classified: Secondary | ICD-10-CM | POA: Diagnosis not present

## 2022-01-04 DIAGNOSIS — M5441 Lumbago with sciatica, right side: Secondary | ICD-10-CM | POA: Diagnosis not present

## 2022-01-04 DIAGNOSIS — M797 Fibromyalgia: Secondary | ICD-10-CM | POA: Diagnosis not present

## 2022-01-04 DIAGNOSIS — G894 Chronic pain syndrome: Secondary | ICD-10-CM | POA: Diagnosis not present

## 2022-01-13 NOTE — Patient Instructions (Signed)
? ? ? ? ? ? ? ? Denise Avila ? 01/13/2022  ?  ? '@PREFPERIOPPHARMACY'$ @ ? ? Your procedure is scheduled on  01/20/2022. ? ? Report to [facility] at  0730 A.M. ? ? Call this number if you have problems the morning of surgery: ? [phone] ? ? Remember: ? Do not eat or drink after midnight. ?  ?  ? Take these medicines the morning of surgery with A SIP OF WATER  ? ?allopurinol, baclofen, celexa, celebrex, cymbalta, gabapentin,prilosec, oxycodone(if needed), zanaflex(if needed). ? ?You can take any of your migraine medications as directed if you have a migraine. ? ?  ? Do not wear jewelry, make-up or nail polish. ? Do not wear lotions, powders, or perfumes, or deodorant. ? Do not shave 48 hours prior to surgery.  Men may shave face and neck. ? Do not bring valuables to the hospital. ? [facility] is not responsible for any belongings or valuables. ? ?Contacts, dentures or bridgework may not be worn into surgery.  Leave your suitcase in the car.  After surgery it may be brought to your room. ? ?For patients admitted to the hospital, discharge time will be determined by your treatment team. ? ?Patients discharged the day of surgery will not be allowed to drive home and must have someone with them for 24 hours.  ? ? ?Special instructions:   DO NOT smoke tobacco or vape for 24 hours before your procedure. ? ?Please read over the following fact sheets that you were given. ?Coughing and Deep Breathing, Surgical Site Infection Prevention, Anesthesia Post-op Instructions, and Care and Recovery After Surgery ?  ? ? ? Ureteral Stent Implantation, Care After ?This sheet gives you information about how to care for yourself after your procedure. Your health care provider may also give you more specific instructions. If you have problems or questions, contact your health care provider. ?What can I expect after the procedure? ?After the procedure, it is common to have: ?Nausea. ?Mild pain when you urinate. You may feel this pain in  your lower back or lower abdomen. The pain should stop within a few minutes after you urinate. This may last for up to 1 week. ?A small amount of blood in your urine for several days. ?Follow these instructions at home: ?Medicines ?Take over-the-counter and prescription medicines only as told by your health care provider. ?If you were prescribed an antibiotic medicine, take it as told by your health care provider. Do not stop taking the antibiotic even if you start to feel better. ?Do not drive for 24 hours if you were given a sedative during your procedure. ?Ask your health care provider if the medicine prescribed to you requires you to avoid driving or using heavy machinery. ?Activity ?Rest as told by your health care provider. ?Avoid sitting for a long time without moving. Get up to take short walks every 1-2 hours. This is important to improve blood flow and breathing. Ask for help if you feel weak or unsteady. ?Return to your normal activities as told by your health care provider. Ask your health care provider what activities are safe for you. ?General instructions ? ?Watch for any blood in your urine. Call your health care provider if the amount of blood in your urine increases. ?If you have a catheter: ?Follow instructions from your health care provider about taking care of your catheter and collection bag. ?Do not take baths, swim, or use a hot tub until your health care provider approves.  Ask your health care provider if you may take showers. You may only be allowed to take sponge baths. ?Drink enough fluid to keep your urine pale yellow. ?Do not use any products that contain nicotine or tobacco, such as cigarettes, e-cigarettes, and chewing tobacco. These can delay healing after surgery. If you need help quitting, ask your health care provider. ?Keep all follow-up visits as told by your health care provider. This is important. ?Contact a health care provider if: ?You have pain that gets worse or does not  get better with medicine, especially pain when you urinate. ?You have difficulty urinating. ?You feel nauseous or you vomit repeatedly during a period of more than 2 days after the procedure. ?Get help right away if: ?Your urine is dark red or has blood clots in it. ?You are leaking urine (have incontinence). ?The end of the stent comes out of your urethra. ?You cannot urinate. ?You have sudden, sharp, or severe pain in your abdomen or lower back. ?You have a fever. ?You have swelling or pain in your legs. ?You have difficulty breathing. ?Summary ?After the procedure, it is common to have mild pain when you urinate that goes away within a few minutes after you urinate. This may last for up to 1 week. ?Watch for any blood in your urine. Call your health care provider if the amount of blood in your urine increases. ?Take over-the-counter and prescription medicines only as told by your health care provider. ?Drink enough fluid to keep your urine pale yellow. ?This information is not intended to replace advice given to you by your health care provider. Make sure you discuss any questions you have with your health care provider. ?Document Revised: 06/26/2018 Document Reviewed: 06/27/2018 ?Elsevier Patient Education ? [place]. ?General Anesthesia, Adult, Care After ?This sheet gives you information about how to care for yourself after your procedure. Your health care provider may also give you more specific instructions. If you have problems or questions, contact your health care provider. ?What can I expect after the procedure? ?After the procedure, the following side effects are common: ?Pain or discomfort at the IV site. ?Nausea. ?Vomiting. ?Sore throat. ?Trouble concentrating. ?Feeling cold or chills. ?Feeling weak or tired. ?Sleepiness and fatigue. ?Soreness and body aches. These side effects can affect parts of the body that were not involved in surgery. ?Follow these instructions at home: ?For the time  period you were told by your health care provider: ? ?Rest. ?Do not participate in activities where you could fall or become injured. ?Do not drive or use machinery. ?Do not drink alcohol. ?Do not take sleeping pills or medicines that cause drowsiness. ?Do not make important decisions or sign legal documents. ?Do not take care of children on your own. ?Eating and drinking ?Follow any instructions from your health care provider about eating or drinking restrictions. ?When you feel hungry, start by eating small amounts of foods that are soft and easy to digest (bland), such as toast. Gradually return to your regular diet. ?Drink enough fluid to keep your urine pale yellow. ?If you vomit, rehydrate by drinking water, juice, or clear broth. ?General instructions ?If you have sleep apnea, surgery and certain medicines can increase your risk for breathing problems. Follow instructions from your health care provider about wearing your sleep device: ?Anytime you are sleeping, including during daytime naps. ?While taking prescription pain medicines, sleeping medicines, or medicines that make you drowsy. ?Have a responsible adult stay with you for the time you are  told. It is important to have someone help care for you until you are awake and alert. ?Return to your normal activities as told by your health care provider. Ask your health care provider what activities are safe for you. ?Take over-the-counter and prescription medicines only as told by your health care provider. ?If you smoke, do not smoke without supervision. ?Keep all follow-up visits as told by your health care provider. This is important. ?Contact a health care provider if: ?You have nausea or vomiting that does not get better with medicine. ?You cannot eat or drink without vomiting. ?You have pain that does not get better with medicine. ?You are unable to pass urine. ?You develop a skin rash. ?You have a fever. ?You have redness around your IV site that gets  worse. ?Get help right away if: ?You have difficulty breathing. ?You have chest pain. ?You have blood in your urine or stool, or you vomit blood. ?Summary ?After the procedure, it is common to have a so

## 2022-01-17 ENCOUNTER — Telehealth: Payer: Self-pay

## 2022-01-17 ENCOUNTER — Other Ambulatory Visit: Payer: Self-pay | Admitting: Urology

## 2022-01-17 ENCOUNTER — Encounter (HOSPITAL_COMMUNITY)
Admission: RE | Admit: 2022-01-17 | Discharge: 2022-01-17 | Disposition: A | Discharge status: Home / Self Care | Payer: Medicare HMO | Source: Ambulatory Visit | Attending: Urology | Admitting: Urology

## 2022-01-17 ENCOUNTER — Encounter (HOSPITAL_COMMUNITY): Payer: Self-pay

## 2022-01-17 VITALS — BP 109/73 | HR 86 | Temp 97.6°F | Resp 18 | Ht 72.0 in | Wt 230.0 lb

## 2022-01-17 DIAGNOSIS — E875 Hyperkalemia: Secondary | ICD-10-CM | POA: Insufficient documentation

## 2022-01-17 DIAGNOSIS — Z01812 Encounter for preprocedural laboratory examination: Secondary | ICD-10-CM | POA: Insufficient documentation

## 2022-01-17 HISTORY — DX: Fibromyalgia: M79.7

## 2022-01-17 LAB — BASIC METABOLIC PANEL
Anion gap: 9 (ref 5–15)
BUN: 20 mg/dL (ref 6–20)
CO2: 22 mmol/L (ref 22–32)
Calcium: 9.1 mg/dL (ref 8.9–10.3)
Chloride: 107 mmol/L (ref 98–111)
Creatinine, Ser: 0.67 mg/dL (ref 0.44–1.00)
GFR, Estimated: >60 mL/min (ref >60)
Glucose, Bld: 99 mg/dL (ref 70–99)
Potassium: 4.2 mmol/L (ref 3.5–5.1)
Sodium: 138 mmol/L (ref 135–145)

## 2022-01-17 MED ORDER — HYDROMORPHONE HCL 4 MG PO TABS: 4.0000 mg | ORAL_TABLET | ORAL | 0 refills | Status: DC | PRN

## 2022-01-17 MED ORDER — FLUCONAZOLE 100 MG PO TABS: 100.0000 mg | ORAL_TABLET | Freq: Every day | ORAL | 0 refills | Status: DC

## 2022-01-17 NOTE — Telephone Encounter (Signed)
Patient called office and left message that she needed something for yeast infection. ? ?Reviewed with Dr. Alyson Ingles- Diflucan '100mg'$  by mouth daily x 7 days. Sent to pharmacy. Patient called- no answer. Left message for medication sent to pharmacy. ? ?Patient also stated in her voicemail pain medication not helping. Asked patient to call back to office and verify if she has taken the oxycodone. ?

## 2022-01-20 ENCOUNTER — Encounter (HOSPITAL_COMMUNITY): Payer: Self-pay | Admitting: Urology

## 2022-01-20 ENCOUNTER — Ambulatory Visit (HOSPITAL_COMMUNITY)
Admission: RE | Admit: 2022-01-20 | Discharge: 2022-01-20 | Disposition: A | Discharge status: Home / Self Care | Payer: Medicare HMO | Attending: Urology | Admitting: Urology

## 2022-01-20 ENCOUNTER — Encounter (HOSPITAL_COMMUNITY)
Admission: RE | Disposition: A | Discharge status: Home / Self Care | Payer: Self-pay | Source: Home / Self Care | Attending: Urology

## 2022-01-20 ENCOUNTER — Ambulatory Visit (HOSPITAL_BASED_OUTPATIENT_CLINIC_OR_DEPARTMENT_OTHER): Payer: Medicare HMO | Admitting: Certified Registered Nurse Anesthetist

## 2022-01-20 ENCOUNTER — Ambulatory Visit (HOSPITAL_COMMUNITY): Payer: Medicare HMO

## 2022-01-20 ENCOUNTER — Ambulatory Visit (HOSPITAL_COMMUNITY): Payer: Medicare HMO | Admitting: Certified Registered Nurse Anesthetist

## 2022-01-20 DIAGNOSIS — N200 Calculus of kidney: Secondary | ICD-10-CM | POA: Insufficient documentation

## 2022-01-20 DIAGNOSIS — K219 Gastro-esophageal reflux disease without esophagitis: Secondary | ICD-10-CM | POA: Insufficient documentation

## 2022-01-20 DIAGNOSIS — R569 Unspecified convulsions: Secondary | ICD-10-CM | POA: Insufficient documentation

## 2022-01-20 HISTORY — PX: STONE EXTRACTION WITH BASKET: SHX5318

## 2022-01-20 HISTORY — PX: CYSTOSCOPY WITH RETROGRADE PYELOGRAM, URETEROSCOPY AND STENT PLACEMENT: SHX5789

## 2022-01-20 SURGERY — CYSTOURETEROSCOPY, WITH RETROGRADE PYELOGRAM AND STENT INSERTION
Anesthesia: General | Site: Ureter | Laterality: Bilateral

## 2022-01-20 MED ORDER — PHENYLEPHRINE 80 MCG/ML (10ML) SYRINGE FOR IV PUSH (FOR BLOOD PRESSURE SUPPORT)
PREFILLED_SYRINGE | INTRAVENOUS | Status: AC
  Filled 2022-01-20: qty 10

## 2022-01-20 MED ORDER — LIDOCAINE HCL (PF) 2 % IJ SOLN
INTRAMUSCULAR | Status: DC | PRN
  Administered 2022-01-20: 50 mg via INTRADERMAL

## 2022-01-20 MED ORDER — DOXYCYCLINE HYCLATE 100 MG PO TBEC
100.0000 mg | DELAYED_RELEASE_TABLET | Freq: Two times a day (BID) | ORAL | 0 refills | Status: DC
Start: 2022-01-20 — End: 2022-10-31

## 2022-01-20 MED ORDER — WATER FOR IRRIGATION, STERILE IR SOLN
Status: DC | PRN
  Administered 2022-01-20: 1000 mL

## 2022-01-20 MED ORDER — ONDANSETRON HCL 4 MG/2ML IJ SOLN
4.0000 mg | Freq: Once | INTRAMUSCULAR | Status: AC | PRN
  Administered 2022-01-20: 4 mg via INTRAVENOUS
  Filled 2022-01-20: qty 2

## 2022-01-20 MED ORDER — KETOROLAC TROMETHAMINE 30 MG/ML IJ SOLN
INTRAMUSCULAR | Status: AC
  Filled 2022-01-20: qty 1

## 2022-01-20 MED ORDER — DIPHENHYDRAMINE HCL 50 MG/ML IJ SOLN
25.0000 mg | Freq: Once | INTRAMUSCULAR | Status: AC
  Administered 2022-01-20: 25 mg via INTRAVENOUS

## 2022-01-20 MED ORDER — DEXAMETHASONE SODIUM PHOSPHATE 10 MG/ML IJ SOLN
INTRAMUSCULAR | Status: DC | PRN
  Administered 2022-01-20: 10 mg via INTRAVENOUS

## 2022-01-20 MED ORDER — ONDANSETRON HCL 4 MG/2ML IJ SOLN
INTRAMUSCULAR | Status: AC
  Filled 2022-01-20: qty 2

## 2022-01-20 MED ORDER — OXYCODONE HCL 5 MG PO TABS
5.0000 mg | ORAL_TABLET | Freq: Once | ORAL | Status: AC
  Administered 2022-01-20: 5 mg via ORAL

## 2022-01-20 MED ORDER — DIPHENHYDRAMINE HCL 50 MG/ML IJ SOLN
INTRAMUSCULAR | Status: AC
Start: 2022-01-20 — End: ?
  Filled 2022-01-20: qty 1

## 2022-01-20 MED ORDER — HYDROMORPHONE HCL 1 MG/ML IJ SOLN
0.5000 mg | Freq: Once | INTRAMUSCULAR | Status: AC
  Administered 2022-01-20: 0.5 mg via INTRAVENOUS

## 2022-01-20 MED ORDER — HYDROMORPHONE HCL 1 MG/ML IJ SOLN
INTRAMUSCULAR | Status: AC
  Filled 2022-01-20: qty 0.5

## 2022-01-20 MED ORDER — GABAPENTIN 800 MG PO TABS: 1600.0000 mg | ORAL_TABLET | Freq: Two times a day (BID) | ORAL | 8 refills | Status: DC

## 2022-01-20 MED ORDER — MIDAZOLAM HCL 5 MG/5ML IJ SOLN
INTRAMUSCULAR | Status: DC | PRN
  Administered 2022-01-20: 2 mg via INTRAVENOUS

## 2022-01-20 MED ORDER — PHENYLEPHRINE HCL-NACL 20-0.9 MG/250ML-% IV SOLN
INTRAVENOUS | Status: DC | PRN
  Administered 2022-01-20: 30 ug/min via INTRAVENOUS

## 2022-01-20 MED ORDER — ORAL CARE MOUTH RINSE: 15.0000 mL | Freq: Once | OROMUCOSAL | Status: AC

## 2022-01-20 MED ORDER — DIATRIZOATE MEGLUMINE 30 % UR SOLN
URETHRAL | Status: AC
  Filled 2022-01-20: qty 100

## 2022-01-20 MED ORDER — OXYCODONE HCL 5 MG PO TABS
ORAL_TABLET | ORAL | Status: AC
  Filled 2022-01-20: qty 1

## 2022-01-20 MED ORDER — ONDANSETRON HCL 4 MG/2ML IJ SOLN
INTRAMUSCULAR | Status: DC | PRN
  Administered 2022-01-20: 4 mg via INTRAVENOUS

## 2022-01-20 MED ORDER — PROPOFOL 10 MG/ML IV BOLUS
INTRAVENOUS | Status: DC | PRN
  Administered 2022-01-20: 200 mg via INTRAVENOUS

## 2022-01-20 MED ORDER — GENTAMICIN SULFATE 40 MG/ML IJ SOLN
5.0000 mg/kg | INTRAVENOUS | Status: AC
  Administered 2022-01-20: 430 mg via INTRAVENOUS
  Filled 2022-01-20: qty 10.75

## 2022-01-20 MED ORDER — FENTANYL CITRATE PF 50 MCG/ML IJ SOSY
25.0000 ug | PREFILLED_SYRINGE | INTRAMUSCULAR | Status: DC | PRN
  Administered 2022-01-20 (×3): 50 ug via INTRAVENOUS
  Filled 2022-01-20 (×3): qty 1

## 2022-01-20 MED ORDER — PHENYLEPHRINE HCL (PRESSORS) 10 MG/ML IV SOLN
INTRAVENOUS | Status: DC | PRN
  Administered 2022-01-20 (×2): 80 ug via INTRAVENOUS
  Administered 2022-01-20: 160 ug via INTRAVENOUS

## 2022-01-20 MED ORDER — DIATRIZOATE MEGLUMINE 30 % UR SOLN
URETHRAL | Status: DC | PRN
  Administered 2022-01-20: 12 mL via URETHRAL

## 2022-01-20 MED ORDER — HYDROMORPHONE HCL 4 MG PO TABS
4.0000 mg | ORAL_TABLET | ORAL | 0 refills | Status: DC | PRN
Start: 2022-01-20 — End: 2022-10-31

## 2022-01-20 MED ORDER — FENTANYL CITRATE (PF) 100 MCG/2ML IJ SOLN
INTRAMUSCULAR | Status: AC
  Filled 2022-01-20: qty 2

## 2022-01-20 MED ORDER — MIDAZOLAM HCL 2 MG/2ML IJ SOLN
INTRAMUSCULAR | Status: AC
  Filled 2022-01-20: qty 2

## 2022-01-20 MED ORDER — SODIUM CHLORIDE 0.9 % IR SOLN
Status: DC | PRN
  Administered 2022-01-20 (×2): 3000 mL

## 2022-01-20 MED ORDER — LIDOCAINE HCL (PF) 2 % IJ SOLN
INTRAMUSCULAR | Status: AC
  Filled 2022-01-20: qty 5

## 2022-01-20 MED ORDER — DEXAMETHASONE SODIUM PHOSPHATE 10 MG/ML IJ SOLN
INTRAMUSCULAR | Status: AC
  Filled 2022-01-20: qty 1

## 2022-01-20 MED ORDER — CHLORHEXIDINE GLUCONATE 0.12 % MT SOLN
15.0000 mL | Freq: Once | OROMUCOSAL | Status: AC
  Administered 2022-01-20: 15 mL via OROMUCOSAL

## 2022-01-20 MED ORDER — PROPOFOL 10 MG/ML IV BOLUS
INTRAVENOUS | Status: AC
  Filled 2022-01-20: qty 20

## 2022-01-20 MED ORDER — LACTATED RINGERS IV SOLN
INTRAVENOUS | Status: DC
  Administered 2022-01-20: 1000 mL via INTRAVENOUS

## 2022-01-20 MED ORDER — FENTANYL CITRATE (PF) 100 MCG/2ML IJ SOLN
INTRAMUSCULAR | Status: DC | PRN
  Administered 2022-01-20 (×3): 12.5 ug via INTRAVENOUS

## 2022-01-20 SURGICAL SUPPLY — 27 items
BAG DRAIN URO TABLE W/ADPT NS (BAG) ×3 IMPLANT
BAG DRN 8 ADPR NS SKTRN CSTL (BAG) ×2
BAG HAMPER (MISCELLANEOUS) ×3 IMPLANT
CATH INTERMIT  6FR 70CM (CATHETERS) ×3 IMPLANT
CLOTH BEACON ORANGE TIMEOUT ST (SAFETY) ×3 IMPLANT
DECANTER SPIKE VIAL GLASS SM (MISCELLANEOUS) ×1 IMPLANT
EXTRACTOR STONE NITINOL NGAGE (UROLOGICAL SUPPLIES) ×2 IMPLANT
GLOVE BIO SURGEON STRL SZ8 (GLOVE) ×3 IMPLANT
GLOVE BIOGEL PI IND STRL 7.0 (GLOVE) ×4 IMPLANT
GLOVE BIOGEL PI INDICATOR 7.0 (GLOVE) ×2
GOWN STRL REUS W/TWL LRG LVL3 (GOWN DISPOSABLE) ×3 IMPLANT
GOWN STRL REUS W/TWL XL LVL3 (GOWN DISPOSABLE) ×3 IMPLANT
GUIDEWIRE STR DUAL SENSOR (WIRE) ×3 IMPLANT
GUIDEWIRE STR ZIPWIRE 035X150 (MISCELLANEOUS) ×3 IMPLANT
IV NS IRRIG 3000ML ARTHROMATIC (IV SOLUTION) ×6 IMPLANT
KIT TURNOVER CYSTO (KITS) ×3 IMPLANT
MANIFOLD NEPTUNE II (INSTRUMENTS) ×3 IMPLANT
PACK CYSTO (CUSTOM PROCEDURE TRAY) ×3 IMPLANT
PAD ARMBOARD 7.5X6 YLW CONV (MISCELLANEOUS) ×3 IMPLANT
SHEATH URETERAL 12FRX35CM (MISCELLANEOUS) ×2 IMPLANT
STENT URET 6FRX26 CONTOUR (STENTS) ×4 IMPLANT
SYR 10ML LL (SYRINGE) ×3 IMPLANT
SYR CONTROL 10ML LL (SYRINGE) ×3 IMPLANT
TOWEL OR 17X26 4PK STRL BLUE (TOWEL DISPOSABLE) ×3 IMPLANT
TRACTIP FLEXIVA PULS ID 200XHI (Laser) IMPLANT
TRACTIP FLEXIVA PULSE ID 200 (Laser)
WATER STERILE IRR 500ML POUR (IV SOLUTION) ×3 IMPLANT

## 2022-01-20 NOTE — Op Note (Signed)
Marland KitchenPreoperative diagnosis: bilateral renal calculi ? ?Postoperative diagnosis: Same ? ?Procedure: 1 cystoscopy ?2. Bilateral retrograde pyelography ?3.  Intraoperative fluoroscopy, under one hour, with interpretation ?4.  Bilateral ureteroscopic stone manipulation with basket extraction ?5.  bilateral 6 x 26 JJ stent placement ? ?Attending: Rosie Fate ? ?Anesthesia: General ? ?Estimated blood loss: None ? ?Drains: bilateral 6 x 26 JJ ureteral stent with tether ? ?Specimens: stone for analysis ? ?Antibiotics: gentamicin ? ?Findings: bilateral mid and lower pole renal calculi. No hydronephrosis. No masses/lesions in the bladder. Ureteral orifices in normal anatomic location. ? ?Indications: Patient is a 46 year old female with a history of bilateral renal calculi and bilateral flank pain. After discussing treatment options, they decided proceed with bilateral ureteroscopic stone manipulation. ? ?Procedure in detail: The patient was brought to the operating room and a brief timeout was done to ensure correct patient, correct procedure, correct site.  General anesthesia was administered patient was placed in dorsal lithotomy position.  Her genitalia was then prepped and draped in usual sterile fashion.  A rigid 30 French cystoscope was passed in the urethra and the bladder.  Bladder was inspected free masses or lesions.  the ureteral orifices were in the normal orthotopic locations. a 6 french ureteral catheter was then instilled into the left ureteral orifice.  a gentle retrograde was obtained and findings noted above. We then advanced a zipwire through the catheter and up to the renal pelvis.  we then removed the cystoscope and cannulated the left ureteral orifice with a semirigid ureteroscope.  We located no stone in the ureter. We then placed a sensor wire up to the renal pelvis. We removed the scope and advanced a 12/14 x 35cm access sheath up to the renal pelvis. We then used the flexible ureteroscope to  perform nephroscopy. We located calculi in the mid and lower poles which were removed with an NGage basket. Once the stone were removed we then removed the access sheath under direct vision and noted to injury to the ureter.  We then placed a 6 x 26 double-j ureteral stent over the original zip wire. We then removed the wire and good coil was noted in the the renal pelvis under fluoroscopy and the bladder under direct vision.  We then turned out attention to the right side. a 6 french ureteral catheter was then instilled into the right ureteral orifice.  a gentle retrograde was obtained and findings noted above We then advanced a zipwire through the catheter and up to the renal pelvis.  we then removed the cystoscope and cannulated the right ureteral orifice with a semirigid ureteroscope.  We located no stone in the ureter. We then placed a sensor wire up to the renal pelvis. We removed the scope and advanced a 12/14 x 35cm access sheath up to the renal pelvis. We then used the flexible ureteroscope to perform nephroscopy. We located calculi int he mid and lower poles which were removed with an NGage basket. Once the stone were removed we then removed the access sheath under direct vision and noted to injury to the ureter. we then placed a 6 x 26 double-j ureteral stent over the original zip wire.  We then removed the wire and good coil was noted in the the renal pelvis under fluoroscopy and the bladder under direct vision.   the bladder was then drained and this concluded the procedure which was well tolerated by patient. ? ?Complications: None ? ?Condition: Stable, extubated, transferred to PACU ? ?Plan: Patient is  to be discharged home as to follow-up in 2 weeks. She is to remove her stents by pulling the tether in 72 hours  ?

## 2022-01-20 NOTE — Anesthesia Postprocedure Evaluation (Signed)
Anesthesia Post Note ? ?Patient: Denise Avila ? ?Procedure(s) Performed: CYSTOSCOPY WITH RETROGRADE PYELOGRAM, URETEROSCOPY AND STENT PLACEMENT (Bilateral: Ureter) ?STONE EXTRACTION WITH BASKET (Bilateral: Ureter) ? ?Patient location during evaluation: Phase II ?Anesthesia Type: General ?Level of consciousness: awake ?Pain management: pain level controlled ?Vital Signs Assessment: post-procedure vital signs reviewed and stable ?Respiratory status: spontaneous breathing and respiratory function stable ?Cardiovascular status: blood pressure returned to baseline and stable ?Postop Assessment: no headache and no apparent nausea or vomiting ?Anesthetic complications: no ?Comments: Late entry ? ? ?No notable events documented. ? ? ?Last Vitals:  ?Vitals:  ? 01/20/22 1242 01/20/22 1245  ?BP:  102/69  ?Pulse: 71   ?Resp:    ?Temp: 36.4 ?C   ?SpO2:    ?  ?Last Pain:  ?Vitals:  ? 01/20/22 1242  ?TempSrc: Oral  ?PainSc:   ? ? ?  ?  ?  ?  ?  ?  ? ?Louann Sjogren ? ? ? ? ?

## 2022-01-20 NOTE — Transfer of Care (Signed)
Immediate Anesthesia Transfer of Care Note ? ?Patient: Denise Avila ? ?Procedure(s) Performed: CYSTOSCOPY WITH RETROGRADE PYELOGRAM, URETEROSCOPY AND STENT PLACEMENT (Bilateral: Ureter) ?STONE EXTRACTION WITH BASKET (Bilateral: Ureter) ? ?Patient Location: PACU ? ?Anesthesia Type:General ? ?Level of Consciousness: awake, alert  and oriented ? ?Airway & Oxygen Therapy: Patient Spontanous Breathing and Patient connected to nasal cannula oxygen ? ?Post-op Assessment: Report given to RN, Post -op Vital signs reviewed and stable, Patient moving all extremities X 4 and Patient able to stick tongue midline ? ?Post vital signs: Reviewed ? ?Last Vitals:  ?Vitals Value Taken Time  ?BP 106/71   ?Temp 97.9   ?Pulse 73   ?Resp 10   ?SpO2 100   ? ? ?Last Pain:  ?Vitals:  ? 01/20/22 0802  ?TempSrc: Oral  ?PainSc: 4   ?   ? ?Patients Stated Pain Goal: 6 (01/20/22 0802) ? ?Complications: No notable events documented. ?

## 2022-01-20 NOTE — Anesthesia Procedure Notes (Signed)
Procedure Name: LMA Insertion ?Date/Time: 01/20/2022 9:06 AM ?Performed by: Maude Leriche, CRNA ?Pre-anesthesia Checklist: Patient identified, Emergency Drugs available, Suction available and Patient being monitored ?Patient Re-evaluated:Patient Re-evaluated prior to induction ?Oxygen Delivery Method: Circle system utilized ?Preoxygenation: Pre-oxygenation with 100% oxygen ?Induction Type: IV induction ?Ventilation: Mask ventilation without difficulty ?LMA: LMA inserted ?LMA Size: 4.0 ?Number of attempts: 1 ?Placement Confirmation: ETT inserted through vocal cords under direct vision, positive ETCO2 and breath sounds checked- equal and bilateral ?Tube secured with: Tape ?Dental Injury: Teeth and Oropharynx as per pre-operative assessment  ? ? ? ? ?

## 2022-01-20 NOTE — Anesthesia Preprocedure Evaluation (Signed)
Anesthesia Evaluation  ?Patient identified by MRN, date of birth, ID band ?Patient awake ? ? ? ?Reviewed: ?Allergy & Precautions, H&P , NPO status , Patient's Chart, lab work & pertinent test results, reviewed documented beta blocker date and time  ? ?Airway ?Mallampati: II ? ?TM Distance: >3 FB ?Neck ROM: full ? ? ? Dental ?no notable dental hx. ? ?  ?Pulmonary ?neg pulmonary ROS,  ?  ?Pulmonary exam normal ?breath sounds clear to auscultation ? ? ? ? ? ? Cardiovascular ?Exercise Tolerance: Good ?negative cardio ROS ? ? ?Rhythm:regular Rate:Normal ? ? ?  ?Neuro/Psych ? Headaches, Seizures -, Well Controlled,  PSYCHIATRIC DISORDERS Anxiety Depression  Neuromuscular disease   ? GI/Hepatic ?Neg liver ROS, GERD  Medicated,  ?Endo/Other  ?negative endocrine ROS ? Renal/GU ?Renal disease  ?negative genitourinary ?  ?Musculoskeletal ? ? Abdominal ?  ?Peds ? Hematology ?negative hematology ROS ?(+)   ?Anesthesia Other Findings ? ? Reproductive/Obstetrics ?negative OB ROS ? ?  ? ? ? ? ? ? ? ? ? ? ? ? ? ?  ?  ? ? ? ? ? ? ? ? ?Anesthesia Physical ? ?Anesthesia Plan ? ?ASA: 2 ? ?Anesthesia Plan: General  ? ?Post-op Pain Management:   ? ?Induction:  ? ?PONV Risk Score and Plan: Ondansetron ? ?Airway Management Planned:  ? ?Additional Equipment:  ? ?Intra-op Plan:  ? ?Post-operative Plan:  ? ?Informed Consent: I have reviewed the patients History and Physical, chart, labs and discussed the procedure including the risks, benefits and alternatives for the proposed anesthesia with the patient or authorized representative who has indicated his/her understanding and acceptance.  ? ? ? ?Dental Advisory Given ? ?Plan Discussed with: CRNA ? ?Anesthesia Plan Comments:   ? ? ? ? ? ? ?Anesthesia Quick Evaluation ? ? ?

## 2022-01-20 NOTE — H&P (Signed)
Urology Admission H&P ? ?Chief Complaint: bilateral flank pain ? ?History of Present Illness: Denise Avila is a 46yo here for bilateral ureteroscopic stone extraction. She has small bilateral renal calculi on CT from 10/2021. She has mild to moderate bilateral flank pain.  ? ?Past Medical History:  ?Diagnosis Date  ? Abnormal Pap smear of cervix 12/2014  ? Acid reflux   ? Adrenal insufficiency ([place])   ? Anxiety   ? Arthritis   ? Arthritis   ? Depression   ? Depression   ? Endometriosis   ? Fibroids   ? age 59  ? Fibromyalgia   ? High cholesterol   ? History of kidney stones   ? Liver disease   ? per pt report, will have biopsy Nov 08, 2017  ? Migraines   ? Pleurisy 02/2015  ? while hospitalized   ? Seizure ([place])   ? on meds and no seizures in 2-3 years. Unknown etiology  ? Skin cancer (melanoma) ([place])   ? Syncope and collapse   ? Uterine cancer ([place])   ? ?Past Surgical History:  ?Procedure Laterality Date  ? ABDOMINAL HYSTERECTOMY    ? ABLATION    ? uterine  ? COLECTOMY    ? CYSTOSCOPY W/ URETERAL STENT PLACEMENT Left 02/02/2015  ? Procedure: CYSTOSCOPY WITH RETROGRADE PYELOGRAM/URETERAL STENT PLACEMENT;  Surgeon: Festus Aloe, MD;  Location: [facility];  Service: Urology;  Laterality: Left;  ? CYSTOSCOPY WITH URETEROSCOPY AND STENT PLACEMENT Left 02/13/2015  ? Procedure: LEFT URETEROSCOPY WITH HOLMIUM LASER AND STENT PLACEMENT;  Surgeon: Festus Aloe, MD;  [facility]: [facility];  Service: Urology;  Laterality: Left;  ? DILATION AND CURETTAGE OF UTERUS    ? ESOPHAGOGASTRODUODENOSCOPY ENDOSCOPY  10/11/2017  ? @ [facility]; BIOPSY OF LOWER INTESTINE  ? HOLMIUM LASER APPLICATION N/A 23/55/7322  ? Procedure: HOLMIUM LASER APPLICATION;  Surgeon: Festus Aloe, MD;  [facility];  Service: Urology;  Laterality: N/A;  ? IR NEPHROSTOMY PLACEMENT RIGHT  07/05/2021  ? KNEE SURGERY Bilateral 2019  ? realignment of knees  ? LAPAROSCOPIC GASTRIC SLEEVE RESECTION    ? LAPAROSCOPY    ? x 4  ? MELANOMA EXCISION  2000  ? melanoma  removed from face, basal cell removed from nose  ? NEPHROLITHOTOMY Right 07/08/2021  ? Procedure: NEPHROLITHOTOMY PERCUTANEOUS- with stent placement;  Surgeon: Cleon Gustin, MD;  Location: [facility];  Service: Urology;  Laterality: Right;  ? REPAIR VAGINAL CUFF N/A 08/21/2015  ? Procedure: REPAIR VAGINAL CUFF, EXAM UNDER ANESTHESIA;  Surgeon: Everitt Amber, MD;  [facility]: [facility];  Service: Gynecology;  Laterality: N/A;  ? ROBOTIC ASSISTED TOTAL HYSTERECTOMY WITH BILATERAL SALPINGO OOPHERECTOMY Bilateral 05/21/2015  ? Procedure: XI ROBOTIC ASSISTED TOTAL HYSTERECTOMY WITH BILATERAL SALPINGO OOPHORECTOMY;  Surgeon: Everitt Amber, MD;  [facility];  Service: Gynecology;  Laterality: Bilateral;  ? ? ?Home Medications:  ?Current Facility-Administered Medications  ?Medication Dose Route Frequency Provider Last Rate Last Admin  ? gentamicin (GARAMYCIN) 430 mg in dextrose 5 % 100 mL IVPB  5 mg/kg (Adjusted) Intravenous 30 min Pre-Op Lamoyne Palencia, Candee Furbish, MD      ? lactated ringers infusion   Intravenous Continuous Louann Sjogren, MD 10 mL/hr at 01/20/22 0836 Continued from Pre-op at 01/20/22 0836  ? ?Facility-Administered Medications Ordered in Other Encounters  ?Medication Dose Route Frequency Provider Last Rate Last Admin  ? gadopentetate dimeglumine (MAGNEVIST) injection 20 mL  20 mL Intravenous Once PRN Melvenia Beam, MD      ? ?Allergies:  ?  Allergies  ?Allergen Reactions  ? Penicillins Swelling and Rash  ?  Rash all over [place] and lip swelling. ?Has patient had a PCN reaction causing immediate rash, facial/tongue/throat swelling, SOB or lightheadedness with hypotension: Yes ?Has patient had a PCN reaction causing severe rash involving mucus membranes or skin necrosis: Yes ?Has patient had a PCN reaction that required hospitalization: No ?If all of the above answers are "NO", then may proceed with Cephalosporin use.  ? Nortriptyline Other (See Comments)  ?  Severe chest pain  ?  ? Nsaids Other (See Comments)  ?   Advised not to take due to gastric sleeve   ? Amoxicillin Rash  ?  Rash all over [place] ?  ? Ciprofloxacin Diarrhea and Nausea Only  ? ? ?Family History  ?Problem Relation Age of Onset  ? Hypertension Mother   ? Melanoma Mother   ? Breast cancer Mother 23  ? Cancer - Other Father   ?     liver  ? Diabetes Mellitus II Maternal Grandmother   ? Heart disease Maternal Grandmother   ? Breast cancer Maternal Grandmother   ? Cancer - Lung Maternal Grandfather   ? Osteoporosis Paternal Grandmother   ? Melanoma Paternal Grandfather   ? ?Social History:  reports that she has never smoked. She has never used smokeless tobacco. She reports that she does not drink alcohol and does not use drugs. ? ?Review of Systems  ?All other systems reviewed and are negative. ? ?Physical Exam:  ?Vital signs in last 24 hours: ?Temp:  [98.4 ?F (36.9 ?C)] 98.4 ?F (36.9 ?C) (04/20 0802) ?Resp:  [15] 15 (04/20 0802) ?BP: (111)/(78) 111/78 (04/20 0802) ?SpO2:  [97 %] 97 % (04/20 0802) ?Physical Exam ?Constitutional:   ?   Appearance: Normal appearance.  ?HENT:  ?   Head: Normocephalic and atraumatic.  ?   Nose: Nose normal. No congestion.  ?   Mouth/Throat:  ?   Mouth: Mucous membranes are dry.  ?Eyes:  ?   Extraocular Movements: Extraocular movements intact.  ?   Pupils: Pupils are equal, round, and reactive to light.  ?Cardiovascular:  ?   Rate and Rhythm: Normal rate and regular rhythm.  ?Pulmonary:  ?   Effort: Pulmonary effort is normal. No respiratory distress.  ?Abdominal:  ?   General: Abdomen is flat. There is no distension.  ?Musculoskeletal:     ?   General: No swelling. Normal range of motion.  ?   Cervical back: Normal range of motion and neck supple.  ?Skin: ?   General: Skin is warm and dry.  ?Neurological:  ?   General: No focal deficit present.  ?   Mental Status: She is alert and oriented to person, place, and time.  ?Psychiatric:     ?   Mood and Affect: Mood normal.     ?   Behavior: Behavior normal.     ?   Thought Content:  Thought content normal.     ?   Judgment: Judgment normal.  ? ? ?Laboratory Data:  ?No results found for this or any previous visit (from the past 24 hour(s)). ?No results found for this or any previous visit (from the past 240 hour(s)). ?Creatinine: ?Recent Labs  ?  01/17/22 ?0829  ?CREATININE 0.67  ? ?Baseline Creatinine: 0.67 ? ?Impression/Assessment:  ?46yo with bilateral renal calculi ? ?Plan:  ?-We discussed the management of kidney stones. These options include observation, ureteroscopy, shockwave lithotripsy ([facility]) and percutaneous nephrolithotomy ([facility]). We  discussed which options are relevant to the patient's stone(s). We discussed the natural history of kidney stones as well as the complications of untreated stones and the impact on quality of life without treatment as well as with each of the above listed treatments. We also discussed the efficacy of each treatment in its ability to clear the stone burden. With any of these management options I discussed the signs and symptoms of infection and the need for emergent treatment should these be experienced. For each option we discussed the ability of each procedure to clear the patient of their stone burden.  ? ?For observation I described the risks which include but are not limited to silent renal damage, life-threatening infection, need for emergent surgery, failure to pass stone and pain.  ? ?For ureteroscopy I described the risks which include bleeding, infection, damage to contiguous structures, positioning injury, ureteral stricture, ureteral avulsion, ureteral injury, need for prolonged ureteral stent, inability to perform ureteroscopy, need for an interval procedure, inability to clear stone burden, stent discomfort/pain, heart attack, stroke, pulmonary embolus and the inherent risks with general anesthesia.  ? ?For shockwave lithotripsy I described the risks which include arrhythmia, kidney contusion, kidney hemorrhage, need for transfusion, pain,  inability to adequately break up stone, inability to pass stone fragments, Steinstrasse, infection associated with obstructing stones, need for alternate surgical procedure, need for repeat shockwave lithotripsy,

## 2022-01-21 ENCOUNTER — Encounter (HOSPITAL_COMMUNITY): Payer: Self-pay | Admitting: Urology

## 2022-01-21 ENCOUNTER — Telehealth: Payer: Self-pay

## 2022-01-21 ENCOUNTER — Other Ambulatory Visit: Payer: Self-pay

## 2022-01-21 DIAGNOSIS — N3021 Other chronic cystitis with hematuria: Secondary | ICD-10-CM

## 2022-01-21 MED ORDER — PHENAZOPYRIDINE HCL 200 MG PO TABS: 200.0000 mg | ORAL_TABLET | Freq: Three times a day (TID) | ORAL | 0 refills | Status: DC | PRN

## 2022-01-21 NOTE — Progress Notes (Unsigned)
Refill request from patient

## 2022-01-21 NOTE — Telephone Encounter (Signed)
Patient states she just had sx on 4/20 and is having bladder pain.  She is requesting a refill of the rx similar to AZO.  RX refill resent with no refills.  ?

## 2022-01-26 ENCOUNTER — Ambulatory Visit (INDEPENDENT_AMBULATORY_CARE_PROVIDER_SITE_OTHER): Payer: Medicare HMO | Admitting: Urology

## 2022-01-26 VITALS — BP 92/66 | HR 101

## 2022-01-26 DIAGNOSIS — N200 Calculus of kidney: Secondary | ICD-10-CM

## 2022-01-26 DIAGNOSIS — N3021 Other chronic cystitis with hematuria: Secondary | ICD-10-CM

## 2022-01-26 DIAGNOSIS — G43011 Migraine without aura, intractable, with status migrainosus: Secondary | ICD-10-CM | POA: Diagnosis not present

## 2022-01-26 LAB — URINALYSIS, ROUTINE W REFLEX MICROSCOPIC
Bilirubin, UA: NEGATIVE
Glucose, UA: NEGATIVE
Ketones, UA: NEGATIVE
Nitrite, UA: NEGATIVE
Specific Gravity, UA: >1.03 — ABNORMAL HIGH (ref 1.005–1.030)
Urobilinogen, Ur: 0.2 mg/dL (ref 0.2–1.0)
pH, UA: 5.5 (ref 5.0–7.5)

## 2022-01-26 LAB — MICROSCOPIC EXAMINATION: WBC, UA: >30 /hpf — AB (ref 0–5)

## 2022-01-26 MED ORDER — SODIUM BICARBONATE 650 MG PO TABS: 650.0000 mg | ORAL_TABLET | Freq: Two times a day (BID) | ORAL | 11 refills | Status: DC

## 2022-01-26 MED ORDER — TAMSULOSIN HCL 0.4 MG PO CAPS: 0.4000 mg | ORAL_CAPSULE | Freq: Every day | ORAL | 11 refills | Status: DC

## 2022-01-26 NOTE — Progress Notes (Signed)
? ?01/26/2022 ?10:17 AM  ? ?Denise Avila ?12/17/1975 ?[id number] ? ?Referring provider: Lennie Odor, PA ?[address] ?[address] ?[place],  [place] [address] ? ?Followup nephrolithiasis ? ? ?HPI: ?Denise Avila is a 46yo here for followup for nephrolithiasis and chronic cystitis. She is doing well since ureteroscopy and denies any flank pain. Urine pH 5.5 and patient could not tolerate UrocitK due to hyperkalemia. She notes passing very small and having intermittent flank pain for several months. No UTI since last visit.  ? ? ?PMH: ?Past Medical History:  ?Diagnosis Date  ? Abnormal Pap smear of cervix 12/2014  ? Acid reflux   ? Adrenal insufficiency ([place])   ? Anxiety   ? Arthritis   ? Arthritis   ? Depression   ? Depression   ? Endometriosis   ? Fibroids   ? age 23  ? Fibromyalgia   ? High cholesterol   ? History of kidney stones   ? Liver disease   ? per pt report, will have biopsy Nov 08, 2017  ? Migraines   ? Pleurisy 02/2015  ? while hospitalized   ? Seizure ([place])   ? on meds and no seizures in 2-3 years. Unknown etiology  ? Skin cancer (melanoma) ([place])   ? Syncope and collapse   ? Uterine cancer ([place])   ? ? ?Surgical History: ?Past Surgical History:  ?Procedure Laterality Date  ? ABDOMINAL HYSTERECTOMY    ? ABLATION    ? uterine  ? COLECTOMY    ? CYSTOSCOPY W/ URETERAL STENT PLACEMENT Left 02/02/2015  ? Procedure: CYSTOSCOPY WITH RETROGRADE PYELOGRAM/URETERAL STENT PLACEMENT;  Surgeon: Festus Aloe, MD;  [facility];  Service: Urology;  Laterality: Left;  ? CYSTOSCOPY WITH RETROGRADE PYELOGRAM, URETEROSCOPY AND STENT PLACEMENT Bilateral 01/20/2022  ? Procedure: CYSTOSCOPY WITH RETROGRADE PYELOGRAM, URETEROSCOPY AND STENT PLACEMENT;  Surgeon: Cleon Gustin, MD;  Location: [facility];  Service: Urology;  Laterality: Bilateral;  ? CYSTOSCOPY WITH URETEROSCOPY AND STENT PLACEMENT Left 02/13/2015  ? Procedure: LEFT URETEROSCOPY WITH HOLMIUM LASER AND STENT PLACEMENT;  Surgeon: Festus Aloe, MD;   [facility];  Service: Urology;  Laterality: Left;  ? DILATION AND CURETTAGE OF UTERUS    ? ESOPHAGOGASTRODUODENOSCOPY ENDOSCOPY  10/11/2017  ? @ [facility]; BIOPSY OF LOWER INTESTINE  ? HOLMIUM LASER APPLICATION N/A 62/13/0865  ? Procedure: HOLMIUM LASER APPLICATION;  Surgeon: Festus Aloe, MD;  [facility];  Service: Urology;  Laterality: N/A;  ? IR NEPHROSTOMY PLACEMENT RIGHT  07/05/2021  ? KNEE SURGERY Bilateral 2019  ? realignment of knees  ? LAPAROSCOPIC GASTRIC SLEEVE RESECTION    ? LAPAROSCOPY    ? x 4  ? MELANOMA EXCISION  2000  ? melanoma removed from face, basal cell removed from nose  ? NEPHROLITHOTOMY Right 07/08/2021  ? Procedure: NEPHROLITHOTOMY PERCUTANEOUS- with stent placement;  Surgeon: Cleon Gustin, MD;  Location: [facility];  Service: Urology;  Laterality: Right;  ? REPAIR VAGINAL CUFF N/A 08/21/2015  ? Procedure: REPAIR VAGINAL CUFF, EXAM UNDER ANESTHESIA;  Surgeon: Everitt Amber, MD;  [facility];  Service: Gynecology;  Laterality: N/A;  ? ROBOTIC ASSISTED TOTAL HYSTERECTOMY WITH BILATERAL SALPINGO OOPHERECTOMY Bilateral 05/21/2015  ? Procedure: XI ROBOTIC ASSISTED TOTAL HYSTERECTOMY WITH BILATERAL SALPINGO OOPHORECTOMY;  Surgeon: Everitt Amber, MD;  [facility];  Service: Gynecology;  Laterality: Bilateral;  ? STONE EXTRACTION WITH BASKET Bilateral 01/20/2022  ? Procedure: STONE EXTRACTION WITH BASKET;  Surgeon: Cleon Gustin, MD;  [facility]: [facility];  Service: Urology;  Laterality: Bilateral;  ? ? ?Home Medications:  ?Allergies as of 01/26/2022   ? ?   Reactions  ? Penicillins Swelling, Rash  ? Rash all over [place] and lip swelling. ?Has patient had a PCN reaction causing immediate rash, facial/tongue/throat swelling, SOB or lightheadedness with hypotension: Yes ?Has patient had a PCN reaction causing severe rash involving mucus membranes or skin necrosis: Yes ?Has patient had a PCN reaction that required hospitalization: No ?If all of the above answers are "NO", then may  proceed with Cephalosporin use.  ? Nortriptyline Other (See Comments)  ? Severe chest pain   ? Nsaids Other (See Comments)  ? Advised not to take due to gastric sleeve   ? Amoxicillin Rash  ? Rash all over [place]  ? Ciprofloxacin Diarrhea, Nausea Only  ? ?  ? ?  ?Medication List  ?  ? ?  ? Accurate as of January 26, 2022 10:17 AM. If you have any questions, ask your nurse or doctor.  ?  ?  ? ?  ? ?acetaminophen 500 MG tablet ?Commonly known as: TYLENOL ?Take 1,000 mg by mouth every 6 (six) hours as needed for moderate pain. ?  ?allopurinol 300 MG tablet ?Commonly known as: Zyloprim ?Take 1 tablet (300 mg total) by mouth daily. ?  ?baclofen 10 MG tablet ?Commonly known as: LIORESAL ?Take 10 mg by mouth 3 (three) times daily. ?  ?celecoxib 200 MG capsule ?Commonly known as: CELEBREX ?Take 200 mg by mouth daily. ?  ?citalopram 10 MG tablet ?Commonly known as: CELEXA ?Take 10 mg by mouth daily. ?  ?clonazePAM 1 MG tablet ?Commonly known as: KLONOPIN ?Take 1 mg by mouth at bedtime. ?  ?diphenhydrAMINE 25 MG tablet ?Commonly known as: BENADRYL ?Take 25 mg by mouth every 6 (six) hours as needed (take with promethazine for migraines). ?  ?doxycycline 100 MG EC tablet ?Commonly known as: DORYX ?Take 1 tablet (100 mg total) by mouth 2 (two) times daily. ?  ?DULoxetine 60 MG capsule ?Commonly known as: CYMBALTA ?Take 60 mg by mouth at bedtime. ?  ?DULoxetine 30 MG capsule ?Commonly known as: CYMBALTA ?Take 30 mg by mouth in the morning. ?  ?eletriptan 40 MG tablet ?Commonly known as: RELPAX ?Take 40 mg by mouth daily as needed for migraine. ?  ?ezetimibe 10 MG tablet ?Commonly known as: ZETIA ?Take 10 mg by mouth daily. ?  ?fluconazole 100 MG tablet ?Commonly known as: DIFLUCAN ?Take 1 tablet (100 mg total) by mouth daily. X 7 days ?  ?gabapentin 800 MG tablet ?Commonly known as: NEURONTIN ?Take 2 tablets (1,600 mg total) by mouth 2 (two) times daily. ?  ?GAS-X PO ?Take 1-2 tablets by mouth 3 (three) times daily as needed  (gas). ?  ?HYDROmorphone 4 MG tablet ?Commonly known as: Dilaudid ?Take 1 tablet (4 mg total) by mouth every 4 (four) hours as needed for severe pain. ?  ?lamoTRIgine 200 MG tablet ?Commonly known as: LAMICTAL ?Take 200 mg by mouth every evening. ?  ?levocetirizine 5 MG tablet ?Commonly known as: XYZAL ?Take 5 mg by mouth every evening. ?  ?methenamine 1 g tablet ?Commonly known as: MANDELAMINE ?Take 1 tablet (1,000 mg total) by mouth 2 (two) times daily. ?  ?mirabegron ER 25 MG Tb24 tablet ?Commonly known as: MYRBETRIQ ?Take 1 tablet (25 mg total) by mouth daily. ?  ?naratriptan 2.5 MG tablet ?Commonly known as: AMERGE ?Take 2.5 mg by mouth as needed for migraine. Take one (1) tablet at onset of headache;  if returns or does not resolve, may repeat after 4 hours; do not exceed five (5) mg in 24 hours. ?  ?nitrofurantoin 50 MG capsule ?Commonly known as: MACRODANTIN ?Take 1 capsule (50 mg total) by mouth at bedtime. To take nightly after completed Bactrim DS medication. ?Take nightly until surgery ?  ?omeprazole 40 MG capsule ?Commonly known as: PRILOSEC ?Take 40 mg by mouth 2 (two) times daily. ?  ?OVER THE COUNTER MEDICATION ?Take 1 tablet by mouth daily. Bariatric Pal otc vitamin ?  ?oxyCODONE-acetaminophen 5-325 MG tablet ?Commonly known as: Percocet ?Take 1 tablet by mouth every 4 (four) hours as needed. ?  ?phenazopyridine 200 MG tablet ?Commonly known as: Pyridium ?Take 1 tablet (200 mg total) by mouth 3 (three) times daily as needed for pain. ?  ?Potassium Citrate 15 MEQ (1620 MG) Tbcr ?Take 1 tablet by mouth 2 (two) times daily. ?  ?promethazine 25 MG tablet ?Commonly known as: PHENERGAN ?Take 1 tablet (25 mg total) by mouth 4 (four) times daily as needed for nausea or vomiting. ?  ?QUEtiapine 50 MG tablet ?Commonly known as: SEROQUEL ?Take 50 mg by mouth at bedtime. ?  ?rizatriptan 10 MG tablet ?Commonly known as: MAXALT ?Take 10 mg by mouth as needed for migraine. ?  ?rosuvastatin 5 MG tablet ?Commonly  known as: CRESTOR ?Take 5 mg by mouth daily. ?  ?sucralfate 1 g tablet ?Commonly known as: Carafate ?Take 1 tablet (1 g total) by mouth 4 (four) times daily. ?  ?sulfamethoxazole-trimethoprim 800-160 MG tablet ?Commo

## 2022-01-27 LAB — CALCULI, WITH PHOTOGRAPH (CLINICAL LAB)
Size Calculi: <1 mm
Weight Calculi: <1 mg

## 2022-01-28 LAB — URINE CULTURE: Organism ID, Bacteria: NO GROWTH

## 2022-02-01 ENCOUNTER — Ambulatory Visit: Payer: Medicare HMO | Admitting: Podiatry

## 2022-02-01 ENCOUNTER — Encounter: Payer: Self-pay | Admitting: Podiatry

## 2022-02-01 ENCOUNTER — Ambulatory Visit (INDEPENDENT_AMBULATORY_CARE_PROVIDER_SITE_OTHER): Payer: Medicare HMO

## 2022-02-01 ENCOUNTER — Encounter: Payer: Self-pay | Admitting: Urology

## 2022-02-01 DIAGNOSIS — S93409A Sprain of unspecified ligament of unspecified ankle, initial encounter: Secondary | ICD-10-CM | POA: Diagnosis not present

## 2022-02-01 DIAGNOSIS — S86311A Strain of muscle(s) and tendon(s) of peroneal muscle group at lower leg level, right leg, initial encounter: Secondary | ICD-10-CM | POA: Diagnosis not present

## 2022-02-01 DIAGNOSIS — M7750 Other enthesopathy of unspecified foot: Secondary | ICD-10-CM

## 2022-02-01 DIAGNOSIS — M7751 Other enthesopathy of right foot: Secondary | ICD-10-CM | POA: Diagnosis not present

## 2022-02-01 DIAGNOSIS — M533 Sacrococcygeal disorders, not elsewhere classified: Secondary | ICD-10-CM | POA: Insufficient documentation

## 2022-02-01 DIAGNOSIS — M5412 Radiculopathy, cervical region: Secondary | ICD-10-CM | POA: Insufficient documentation

## 2022-02-01 NOTE — Patient Instructions (Signed)
Look for Voltaren gel at the pharmacy over the counter or online (also known as diclofenac 1% gel). Apply to the painful areas 3-4x daily with the supplied dosing card. Allow to dry for 10 minutes before going into socks/shoes ? ? ?Call [facility] at [phone] to schedule your MRI ? ?

## 2022-02-01 NOTE — Patient Instructions (Signed)
Dietary Guidelines to Help Prevent Kidney Stones Kidney stones are deposits of minerals and salts that form inside your kidneys. Your risk of developing kidney stones may be greater depending on your diet, your lifestyle, the medicines you take, and whether you have certain medical conditions. Most people can lower their chances of developing kidney stones by following the instructions below. Your dietitian may give you more specific instructions depending on your overall health and the type of kidney stones you tend to develop. What are tips for following this plan? Reading food labels  Choose foods with "no salt added" or "low-salt" labels. Limit your salt (sodium) intake to less than 1,500 mg a day. Choose foods with calcium for each meal and snack. Try to eat about 300 mg of calcium at each meal. Foods that contain 200-500 mg of calcium a serving include: 8 oz (237 mL) of milk, calcium-fortifiednon-dairy milk, and calcium-fortifiedfruit juice. Calcium-fortified means that calcium has been added to these drinks. 8 oz (237 mL) of kefir, yogurt, and soy yogurt. 4 oz (114 g) of tofu. 1 oz (28 g) of cheese. 1 cup (150 g) of dried figs. 1 cup (91 g) of cooked broccoli. One 3 oz (85 g) can of sardines or mackerel. Most people need 1,000-1,500 mg of calcium a day. Talk to your dietitian about how much calcium is recommended for you. Shopping Buy plenty of fresh fruits and vegetables. Most people do not need to avoid fruits and vegetables, even if these foods contain nutrients that may contribute to kidney stones. When shopping for convenience foods, choose: Whole pieces of fruit. Pre-made salads with dressing on the side. Low-fat fruit and yogurt smoothies. Avoid buying frozen meals or prepared deli foods. These can be high in sodium. Look for foods with live cultures, such as yogurt and kefir. Choose high-fiber grains, such as whole-wheat breads, oat bran, and wheat cereals. Cooking Do not add  salt to food when cooking. Place a salt shaker on the table and allow each person to add his or her own salt to taste. Use vegetable protein, such as beans, textured vegetable protein (TVP), or tofu, instead of meat in pasta, casseroles, and soups. Meal planning Eat less salt, if told by your dietitian. To do this: Avoid eating processed or pre-made food. Avoid eating fast food. Eat less animal protein, including cheese, meat, poultry, or fish, if told by your dietitian. To do this: Limit the number of times you have meat, poultry, fish, or cheese each week. Eat a diet free of meat at least 2 days a week. Eat only one serving each day of meat, poultry, fish, or seafood. When you prepare animal protein, cut pieces into small portion sizes. For most meat and fish, one serving is about the size of the palm of your hand. Eat at least five servings of fresh fruits and vegetables each day. To do this: Keep fruits and vegetables on hand for snacks. Eat one piece of fruit or a handful of berries with breakfast. Have a salad and fruit at lunch. Have two kinds of vegetables at dinner. Limit foods that are high in a substance called oxalate. These include: Spinach (cooked), rhubarb, beets, sweet potatoes, and Swiss chard. Peanuts. Potato chips, french fries, and baked potatoes with skin on. Nuts and nut products. Chocolate. If you regularly take a diuretic medicine, make sure to eat at least 1 or 2 servings of fruits or vegetables that are high in potassium each day. These include: Avocado. Banana. Orange, prune,   carrot, or tomato juice. Baked potato. Cabbage. Beans and split peas. Lifestyle  Drink enough fluid to keep your urine pale yellow. This is the most important thing you can do. Spread your fluid intake throughout the day. If you drink alcohol: Limit how much you use to: 0-1 drink a day for women who are not pregnant. 0-2 drinks a day for men. Be aware of how much alcohol is in your  drink. In the [place] one drink equals one 12 oz bottle of beer (355 mL), one 5 oz glass of wine (148 mL), or one 1 oz glass of hard liquor (44 mL). Lose weight if told by your health care provider. Work with your dietitian to find an eating plan and weight loss strategies that work best for you. General information Talk to your health care provider and dietitian about taking daily supplements. You may be told the following depending on your health and the cause of your kidney stones: Not to take supplements with vitamin C. To take a calcium supplement. To take a daily probiotic supplement. To take other supplements such as magnesium, fish oil, or vitamin B6. Take over-the-counter and prescription medicines only as told by your health care provider. These include supplements. What foods should I limit? Limit your intake of the following foods, or eat them as told by your dietitian. Vegetables Spinach. Rhubarb. Beets. Canned vegetables. Pickles. Olives. Baked potatoes with skin. Grains Wheat bran. Baked goods. Salted crackers. Cereals high in sugar. Meats and other proteins Nuts. Nut butters. Large portions of meat, poultry, or fish. Salted, precooked, or cured meats, such as sausages, meat loaves, and hot dogs. Dairy Cheese. Beverages Regular soft drinks. Regular vegetable juice. Seasonings and condiments Seasoning blends with salt. Salad dressings. Soy sauce. Ketchup. Barbecue sauce. Other foods Canned soups. Canned pasta sauce. Casseroles. Pizza. Lasagna. Frozen meals. Potato chips. French fries. The items listed above may not be a complete list of foods and beverages you should limit. Contact a dietitian for more information. What foods should I avoid? Talk to your dietitian about specific foods you should avoid based on the type of kidney stones you have and your overall health. Fruits Grapefruit. The item listed above may not be a complete list of foods and beverages you should  avoid. Contact a dietitian for more information. Summary Kidney stones are deposits of minerals and salts that form inside your kidneys. You can lower your risk of kidney stones by making changes to your diet. The most important thing you can do is drink enough fluid. Drink enough fluid to keep your urine pale yellow. Talk to your dietitian about how much calcium you should have each day, and eat less salt and animal protein as told by your dietitian. This information is not intended to replace advice given to you by your health care provider. Make sure you discuss any questions you have with your health care provider. Document Revised: 05/31/2021 Document Reviewed: 05/31/2021 Elsevier Patient Education  2023 [employer]  

## 2022-02-06 NOTE — Progress Notes (Signed)
?  Subjective:  ?Patient ID: Denise Avila, female    DOB: 09-Feb-1976,  MRN: [id number] ? ?Chief Complaint  ?Patient presents with  ? Foot Pain  ?  )(NP) Rt foot pain, side, radiates to ankle bone, burning  ? ? ?46 y.o. female presents with the above complaint. History confirmed with patient.  She has had significant worsening right lateral foot pain for about 1 year now.  Her primary did x-rays in November which did not show any fracture or osseous issues, she was sent to physical therapy and this made it worse.  The pain is along the peroneal tendon.  Burns while sitting in an stabs and walking.  The left foot is also painful nearly as bad ? ?Objective:  ?Physical Exam: ?warm, good capillary refill, no trophic changes or ulcerative lesions, normal DP and PT pulses, normal sensory exam, and significant pain with resisted eversion and plantarflexion most of the pain along the peroneal tendon as well as the lateral ankle ligament complex. ? ?Radiographs: ?Multiple views x-ray of the right foot: no fracture, dislocation, swelling or degenerative changes noted and new radiographs taken today show no osseous abnormalities in the area of concern ?Assessment:  ? ?1. Peroneal tendon tear, right, initial encounter   ?2. Rupture of ligament of lateral aspect of ankle   ?3. Tendonitis of ankle or foot   ? ? ? ?Plan:  ?Patient was evaluated and treated and all questions answered. ? ?I reviewed my clinical and radiographic findings with her in detail.  We discussed that the fact that she worsened with physical therapy we did believe she likely has a lateral ankle ligament tear or peroneal tendon tear.  I am ordering MRI to evaluate this.  Surgery may be indicated in this MRI will be used for surgical planning.  We discussed RICE protocol and immobilization which I recommended in a cam boot and this was dispensed today.  I will see her back after the MRI for review and further treatment.  We also discussed using Voltaren gel to  alleviate pain and inflammation. ? ?Return for after MRI to review.  ? ?

## 2022-02-08 ENCOUNTER — Ambulatory Visit
Admission: RE | Admit: 2022-02-08 | Discharge: 2022-02-08 | Disposition: A | Discharge status: Home / Self Care | Payer: Medicare HMO | Source: Ambulatory Visit | Attending: Podiatry | Admitting: Podiatry

## 2022-02-08 DIAGNOSIS — M19071 Primary osteoarthritis, right ankle and foot: Secondary | ICD-10-CM | POA: Diagnosis not present

## 2022-02-08 DIAGNOSIS — S99911A Unspecified injury of right ankle, initial encounter: Secondary | ICD-10-CM | POA: Diagnosis not present

## 2022-02-08 DIAGNOSIS — S86311A Strain of muscle(s) and tendon(s) of peroneal muscle group at lower leg level, right leg, initial encounter: Secondary | ICD-10-CM

## 2022-02-08 DIAGNOSIS — S93409A Sprain of unspecified ligament of unspecified ankle, initial encounter: Secondary | ICD-10-CM

## 2022-02-10 ENCOUNTER — Telehealth: Payer: Self-pay | Admitting: Podiatry

## 2022-02-10 DIAGNOSIS — F331 Major depressive disorder, recurrent, moderate: Secondary | ICD-10-CM | POA: Diagnosis not present

## 2022-02-10 NOTE — Telephone Encounter (Signed)
Left message for pt with the information that Dr Sherryle Lis said and asked her to call if any questions. ?

## 2022-02-10 NOTE — Telephone Encounter (Signed)
Pts walking boot that was given to her stopped pumping up about 3 days after she got it. Now her foot/leg is slipping around. What do you recommend her do? Please advise ?

## 2022-02-10 NOTE — Telephone Encounter (Signed)
Patient will come in tomorrow before closing to exchange the boot out since pump is not working and the straps aren't tightening enough to help support her foot. ?

## 2022-02-21 ENCOUNTER — Encounter: Payer: Self-pay | Admitting: Podiatry

## 2022-02-21 ENCOUNTER — Ambulatory Visit: Payer: Medicare HMO | Admitting: Podiatry

## 2022-02-21 DIAGNOSIS — G5761 Lesion of plantar nerve, right lower limb: Secondary | ICD-10-CM

## 2022-02-21 DIAGNOSIS — G5781 Other specified mononeuropathies of right lower limb: Secondary | ICD-10-CM

## 2022-02-21 DIAGNOSIS — S93491S Sprain of other ligament of right ankle, sequela: Secondary | ICD-10-CM

## 2022-02-21 NOTE — Progress Notes (Signed)
  Subjective:  Patient ID: Denise Avila, female    DOB: 31-Jan-1976,  MRN: [id number]  Chief Complaint  Patient presents with   Tendonitis    Peroneal tendon follow up MRI Right    46 y.o. female presents with the above complaint. History confirmed with patient.  She has had significant worsening right lateral foot pain for about 1 year now.  Her primary did x-rays in November which did not show any fracture or osseous issues, she was sent to physical therapy and this made it worse.  The pain is along the peroneal tendon.  Burns while sitting in an stabs and walking.  The left foot is also painful nearly as bad   Interval history: Since last visit she completed the MRI is still painful she has been in the cam boot still.  Objective:  Physical Exam: warm, good capillary refill, no trophic changes or ulcerative lesions, normal DP and PT pulses, normal sensory exam, mild tenderness over the anterior ankle and ATFL, most of the pain is with palpation of the sural nerve distal and posterior to the malleolus and says it radiates into the foot  Radiographs: Multiple views x-ray of the right foot: no fracture, dislocation, swelling or degenerative changes noted and new radiographs taken today show no osseous abnormalities in the area of concern  IMPRESSION:: IMPRESSION: 1. Normal appearance of the peroneal tendons. 2. Mild attenuation of the anterior talofibular ligament may represent the patient's normal anatomy versus a remote partial-thickness tear. 3. Mild talonavicular osteoarthritis.     Electronically Signed   By: Yvonne Kendall M.D.   On: 02/09/2022 12:30   Assessment:   1. Sural neuritis, right   2. Sprain of anterior talofibular ligament of right ankle, sequela      Plan:  Patient was evaluated and treated and all questions answered.  We reviewed the results of the MRI.  Her peroneal tendons are intact and I reviewed the images independently and agree with this report.   She does have some changes in the ATFL that are likely partial tear or attenuation.  This is not where the majority of her pain is.  I think she likely has sural neuritis secondary to her injury and inflammation.  I think she can come out of the cam boot as tolerated the compression sleeve may still help.  I recommended corticosteroid injection along the nerve and this was done in 2 positions distal to and posterior to the malleolus with 4 mg of dexamethasone and 1 cc of Marcaine half percent plain.  She tolerated this well.  I discussed with her we could consider a more detailed examination such as an ultrasound in the area of concern as well as EMG or NCV if necessary.  I will see her back in 4 weeks for reevaluation  Return in about 4 weeks (around 03/21/2022) for f/u on nerve injection.

## 2022-03-07 ENCOUNTER — Ambulatory Visit: Payer: Medicare HMO | Admitting: Podiatry

## 2022-03-07 DIAGNOSIS — S93409A Sprain of unspecified ligament of unspecified ankle, initial encounter: Secondary | ICD-10-CM

## 2022-03-07 DIAGNOSIS — G5781 Other specified mononeuropathies of right lower limb: Secondary | ICD-10-CM

## 2022-03-07 DIAGNOSIS — S86311A Strain of muscle(s) and tendon(s) of peroneal muscle group at lower leg level, right leg, initial encounter: Secondary | ICD-10-CM

## 2022-03-07 NOTE — Progress Notes (Signed)
  Subjective:  Patient ID: Denise Avila, female    DOB: Sep 28, 1976,  MRN: [id number]  Chief Complaint  Patient presents with   neuritis    for f/u on nerve injection.    46 y.o. female presents with the above complaint. History confirmed with patient.  She has had significant worsening right lateral foot pain for about 1 year now.  Her primary did x-rays in November which did not show any fracture or osseous issues, she was sent to physical therapy and this made it worse.  The pain is along the peroneal tendon.  Burns while sitting in an stabs and walking.  The left foot is also painful nearly as bad   Interval history: Injection helped until about 7 PM the night she had it and then it became much worse  Objective:  Physical Exam: warm, good capillary refill, no trophic changes or ulcerative lesions, normal DP and PT pulses, normal sensory exam, pain today is along the sural nerve with direct palpation, no gross instability noted  Radiographs: Multiple views x-ray of the right foot: no fracture, dislocation, swelling or degenerative changes noted and new radiographs taken today show no osseous abnormalities in the area of concern  IMPRESSION:: IMPRESSION: 1. Normal appearance of the peroneal tendons. 2. Mild attenuation of the anterior talofibular ligament may represent the patient's normal anatomy versus a remote partial-thickness tear. 3. Mild talonavicular osteoarthritis.     Electronically Signed   By: Yvonne Kendall M.D.   On: 02/09/2022 12:30   Assessment:   1. Sural neuritis, right   2. Rupture of ligament of lateral aspect of ankle   3. Peroneal tendon tear, right, initial encounter      Plan:  Patient was evaluated and treated and all questions answered.  Unfortunately had minimal temporary relief from the injection.  I recommended evaluating further, clinically her symptoms are most consistent with a sural neuritis or nerve injury or neuroma.  There is no  palpable mass but would like to have this evaluated further by an ultrasound in detail.  Her MRI was negative for any findings along the sural nerve to my exam.  Would like to send her MRI for a second opinion read as well.  Finally will order EMG and NCV to evaluate for function of the sural neuritis and nerve function.  I will see her back after the testing.  No follow-ups on file.

## 2022-03-08 ENCOUNTER — Ambulatory Visit: Payer: Medicare HMO | Admitting: Urology

## 2022-03-14 ENCOUNTER — Telehealth: Payer: Self-pay | Admitting: *Deleted

## 2022-03-14 NOTE — Telephone Encounter (Signed)
error 

## 2022-03-15 DIAGNOSIS — M79644 Pain in right finger(s): Secondary | ICD-10-CM | POA: Diagnosis not present

## 2022-03-15 DIAGNOSIS — M5441 Lumbago with sciatica, right side: Secondary | ICD-10-CM | POA: Diagnosis not present

## 2022-03-15 DIAGNOSIS — R0789 Other chest pain: Secondary | ICD-10-CM | POA: Diagnosis not present

## 2022-03-15 DIAGNOSIS — G894 Chronic pain syndrome: Secondary | ICD-10-CM | POA: Diagnosis not present

## 2022-03-15 DIAGNOSIS — M533 Sacrococcygeal disorders, not elsewhere classified: Secondary | ICD-10-CM | POA: Diagnosis not present

## 2022-03-15 DIAGNOSIS — M797 Fibromyalgia: Secondary | ICD-10-CM | POA: Diagnosis not present

## 2022-03-17 ENCOUNTER — Other Ambulatory Visit: Payer: Medicare HMO

## 2022-03-17 ENCOUNTER — Telehealth: Payer: Self-pay

## 2022-03-17 DIAGNOSIS — N3021 Other chronic cystitis with hematuria: Secondary | ICD-10-CM | POA: Diagnosis not present

## 2022-03-17 NOTE — Telephone Encounter (Signed)
Patient called to see if she can get an rx for a UTI and for a yeast infection, returned her call and left voicemail asking if she is able to drop off a sample in office.  Waiting for call back .

## 2022-03-17 NOTE — Telephone Encounter (Signed)
Patient states she is having trouble starting her stream and having pain in her lower abdomen.  Patient was able to come by the office to drop off a urine sample today.  Results came back clear.  Do you have any recommendations for the patient?  She is leaving out of town on Sunday and will f/u with Dr. Alyson Ingles after her ultrasound.

## 2022-03-18 LAB — URINALYSIS, ROUTINE W REFLEX MICROSCOPIC
Bilirubin, UA: NEGATIVE
Glucose, UA: NEGATIVE
Ketones, UA: NEGATIVE
Leukocytes,UA: NEGATIVE
Nitrite, UA: NEGATIVE
Protein,UA: NEGATIVE
RBC, UA: NEGATIVE
Specific Gravity, UA: 1.01 (ref 1.005–1.030)
Urobilinogen, Ur: 0.2 mg/dL (ref 0.2–1.0)
pH, UA: 5 (ref 5.0–7.5)

## 2022-03-24 NOTE — Telephone Encounter (Signed)
Not that I am aware of and I looked yesterday trying to clear my basket her ultrasound isn't until the 29th I think.

## 2022-03-24 NOTE — Telephone Encounter (Signed)
Its the 27th.

## 2022-03-29 ENCOUNTER — Ambulatory Visit (HOSPITAL_COMMUNITY)
Admission: RE | Admit: 2022-03-29 | Discharge: 2022-03-29 | Disposition: A | Discharge status: Home / Self Care | Payer: Medicare HMO | Source: Ambulatory Visit | Attending: Urology | Admitting: Urology

## 2022-03-29 DIAGNOSIS — N200 Calculus of kidney: Secondary | ICD-10-CM | POA: Diagnosis not present

## 2022-03-29 DIAGNOSIS — N281 Cyst of kidney, acquired: Secondary | ICD-10-CM | POA: Diagnosis not present

## 2022-03-29 DIAGNOSIS — Z87442 Personal history of urinary calculi: Secondary | ICD-10-CM | POA: Diagnosis not present

## 2022-03-30 ENCOUNTER — Telehealth: Payer: Self-pay | Admitting: *Deleted

## 2022-03-30 ENCOUNTER — Ambulatory Visit: Payer: Medicare HMO | Admitting: Urology

## 2022-03-30 NOTE — Telephone Encounter (Signed)
Patient is calling for status of referrals that were sent(NCV, [place]). Please advise which locations they were sent to, not seeing epic.

## 2022-04-08 ENCOUNTER — Ambulatory Visit: Payer: Medicare HMO | Admitting: Urology

## 2022-04-09 DIAGNOSIS — G5731 Lesion of lateral popliteal nerve, right lower limb: Secondary | ICD-10-CM | POA: Diagnosis not present

## 2022-04-09 DIAGNOSIS — G609 Hereditary and idiopathic neuropathy, unspecified: Secondary | ICD-10-CM | POA: Diagnosis not present

## 2022-04-12 DIAGNOSIS — M79644 Pain in right finger(s): Secondary | ICD-10-CM | POA: Diagnosis not present

## 2022-04-12 DIAGNOSIS — G894 Chronic pain syndrome: Secondary | ICD-10-CM | POA: Diagnosis not present

## 2022-04-12 DIAGNOSIS — M797 Fibromyalgia: Secondary | ICD-10-CM | POA: Diagnosis not present

## 2022-04-12 DIAGNOSIS — M5441 Lumbago with sciatica, right side: Secondary | ICD-10-CM | POA: Diagnosis not present

## 2022-04-12 DIAGNOSIS — R0789 Other chest pain: Secondary | ICD-10-CM | POA: Diagnosis not present

## 2022-04-12 DIAGNOSIS — M533 Sacrococcygeal disorders, not elsewhere classified: Secondary | ICD-10-CM | POA: Diagnosis not present

## 2022-04-13 ENCOUNTER — Ambulatory Visit: Payer: Medicare HMO | Admitting: Urology

## 2022-04-13 VITALS — BP 91/66 | HR 81

## 2022-04-13 DIAGNOSIS — R350 Frequency of micturition: Secondary | ICD-10-CM

## 2022-04-13 DIAGNOSIS — N3021 Other chronic cystitis with hematuria: Secondary | ICD-10-CM

## 2022-04-13 DIAGNOSIS — N200 Calculus of kidney: Secondary | ICD-10-CM | POA: Diagnosis not present

## 2022-04-13 LAB — URINALYSIS, ROUTINE W REFLEX MICROSCOPIC
Bilirubin, UA: NEGATIVE
Glucose, UA: NEGATIVE
Leukocytes,UA: NEGATIVE
Nitrite, UA: NEGATIVE
Protein,UA: NEGATIVE
RBC, UA: NEGATIVE
Specific Gravity, UA: 1.02 (ref 1.005–1.030)
Urobilinogen, Ur: 0.2 mg/dL (ref 0.2–1.0)
pH, UA: 5 (ref 5.0–7.5)

## 2022-04-13 MED ORDER — SODIUM BICARBONATE 650 MG PO TABS: 650.0000 mg | ORAL_TABLET | Freq: Two times a day (BID) | ORAL | 11 refills | Status: DC

## 2022-04-13 MED ORDER — NITROFURANTOIN MACROCRYSTAL 50 MG PO CAPS: 50.0000 mg | ORAL_CAPSULE | Freq: Every day | ORAL | 11 refills | Status: DC

## 2022-04-13 NOTE — Progress Notes (Signed)
04/13/2022 10:17 AM   Denise Avila 13-Jun-1976 [id number]  Referring provider: Lennie Odor, [address] [employer] [address] [place],  [place] [id number]  Followup nephrolithiasis   HPI: Denise Avila is a 46yo here for followup for nephrolithiasis and recurrent UTI. No stone events since last visit. Renal US 6/27 shows no calculi and no hydronephrosis. She denies any flank pain. SHe is drinking crystal light and taking sodium bicarb daily. No UTI since last visit. She remains on macrodantin '50mg'$  qhs. No significant LUTS   PMH: Past Medical History:  Diagnosis Date   Abnormal Pap smear of cervix 12/2014   Acid reflux    Adrenal insufficiency (HCC)    Anxiety    Arthritis    Arthritis    Depression    Depression    Endometriosis    Fibroids    age 73   Fibromyalgia    High cholesterol    History of kidney stones    Liver disease    per pt report, will have biopsy Nov 08, 2017   Migraines    Pleurisy 02/2015   while hospitalized    Seizure ([place])    on meds and no seizures in 2-3 years. Unknown etiology   Skin cancer (melanoma) ([place])    Syncope and collapse    Uterine cancer [facility])     Surgical History: Past Surgical History:  Procedure Laterality Date   ABDOMINAL HYSTERECTOMY     ABLATION     uterine   COLECTOMY     CYSTOSCOPY W/ URETERAL STENT PLACEMENT Left 02/02/2015   Procedure: CYSTOSCOPY WITH RETROGRADE PYELOGRAM/URETERAL STENT PLACEMENT;  Surgeon: Festus Aloe, MD;  Location: [facility];  Service: Urology;  Laterality: Left;   CYSTOSCOPY WITH RETROGRADE PYELOGRAM, URETEROSCOPY AND STENT PLACEMENT Bilateral 01/20/2022   Procedure: CYSTOSCOPY WITH RETROGRADE PYELOGRAM, URETEROSCOPY AND STENT PLACEMENT;  Surgeon: Cleon Gustin, MD;  Location: [facility];  Service: Urology;  Laterality: Bilateral;   CYSTOSCOPY WITH URETEROSCOPY AND STENT PLACEMENT Left 02/13/2015   Procedure: LEFT URETEROSCOPY WITH HOLMIUM LASER AND STENT PLACEMENT;  Surgeon: Festus Aloe, MD;  Location: [facility];  Service: Urology;  Laterality: Left;   DILATION AND CURETTAGE OF UTERUS     ESOPHAGOGASTRODUODENOSCOPY ENDOSCOPY  10/11/2017   @ [facility]; BIOPSY OF LOWER INTESTINE   HOLMIUM LASER APPLICATION N/A 19/14/7829   Procedure: HOLMIUM LASER APPLICATION;  Surgeon: Festus Aloe, MD;  Location: [facility];  Service: Urology;  Laterality: N/A;   IR NEPHROSTOMY PLACEMENT RIGHT  07/05/2021   KNEE SURGERY Bilateral 2019   realignment of knees   LAPAROSCOPIC GASTRIC SLEEVE RESECTION     LAPAROSCOPY     x 4   MELANOMA EXCISION  2000   melanoma removed from face, basal cell removed from nose   NEPHROLITHOTOMY Right 07/08/2021   Procedure: NEPHROLITHOTOMY PERCUTANEOUS- with stent placement;  Surgeon: Cleon Gustin, MD;  Location: [facility];  Service: Urology;  Laterality: Right;   REPAIR VAGINAL CUFF N/A 08/21/2015   Procedure: REPAIR VAGINAL CUFF, EXAM UNDER ANESTHESIA;  Surgeon: Everitt Amber, MD;  Location: [facility];  Service: Gynecology;  Laterality: N/A;   ROBOTIC ASSISTED TOTAL HYSTERECTOMY WITH BILATERAL SALPINGO OOPHERECTOMY Bilateral 05/21/2015   Procedure: XI ROBOTIC ASSISTED TOTAL HYSTERECTOMY WITH BILATERAL SALPINGO OOPHORECTOMY;  Surgeon: Everitt Amber, MD;  Location: [facility];  Service: Gynecology;  Laterality: Bilateral;   STONE EXTRACTION WITH BASKET Bilateral 01/20/2022   Procedure: STONE EXTRACTION WITH BASKET;  Surgeon: Cleon Gustin, MD;  Location: [facility];  Service:  Urology;  Laterality: Bilateral;    Home Medications:  Allergies as of 04/13/2022       Reactions   Penicillins Swelling, Rash   Rash all over [place] and lip swelling. Has patient had a PCN reaction causing immediate rash, facial/tongue/throat swelling, SOB or lightheadedness with hypotension: Yes Has patient had a PCN reaction causing severe rash involving mucus membranes or skin necrosis: Yes Has patient had a PCN reaction that required hospitalization: No If all of the above answers are  "NO", then may proceed with Cephalosporin use.   Nortriptyline Other (See Comments)   Severe chest pain    Nsaids Other (See Comments)   Advised not to take due to gastric sleeve    Amoxicillin Rash   Rash all over [place]   Ciprofloxacin Diarrhea, Nausea Only        Medication List        Accurate as of April 13, 2022 10:17 AM. If you have any questions, ask your nurse or doctor.          STOP taking these medications    Potassium Citrate 15 MEQ (1620 MG) Tbcr   Toviaz 4 MG Tb24 tablet Generic drug: fesoterodine       TAKE these medications    acetaminophen 500 MG tablet Commonly known as: TYLENOL Take 1,000 mg by mouth every 6 (six) hours as needed for moderate pain.   allopurinol 300 MG tablet Commonly known as: Zyloprim Take 1 tablet (300 mg total) by mouth daily.   baclofen 10 MG tablet Commonly known as: LIORESAL Take 10 mg by mouth 3 (three) times daily.   celecoxib 200 MG capsule Commonly known as: CELEBREX Take 200 mg by mouth daily.   citalopram 10 MG tablet Commonly known as: CELEXA Take 10 mg by mouth daily.   clonazePAM 1 MG tablet Commonly known as: KLONOPIN Take 1 mg by mouth at bedtime.   diphenhydrAMINE 25 MG tablet Commonly known as: BENADRYL Take 25 mg by mouth every 6 (six) hours as needed (take with promethazine for migraines).   doxycycline 100 MG EC tablet Commonly known as: DORYX Take 1 tablet (100 mg total) by mouth 2 (two) times daily.   DULoxetine 60 MG capsule Commonly known as: CYMBALTA Take 60 mg by mouth at bedtime.   DULoxetine 30 MG capsule Commonly known as: CYMBALTA Take 30 mg by mouth in the morning.   eletriptan 40 MG tablet Commonly known as: RELPAX Take 40 mg by mouth daily as needed for migraine.   ezetimibe 10 MG tablet Commonly known as: ZETIA Take 10 mg by mouth daily.   fluconazole 100 MG tablet Commonly known as: DIFLUCAN Take 1 tablet (100 mg total) by mouth daily. X 7 days   gabapentin  800 MG tablet Commonly known as: NEURONTIN Take 2 tablets (1,600 mg total) by mouth 2 (two) times daily.   GAS-X PO Take 1-2 tablets by mouth 3 (three) times daily as needed (gas).   HYDROmorphone 4 MG tablet Commonly known as: Dilaudid Take 1 tablet (4 mg total) by mouth every 4 (four) hours as needed for severe pain.   lamoTRIgine 200 MG tablet Commonly known as: LAMICTAL Take 200 mg by mouth every evening.   levocetirizine 5 MG tablet Commonly known as: XYZAL Take 5 mg by mouth every evening.   methenamine 1 g tablet Commonly known as: MANDELAMINE Take 1 tablet (1,000 mg total) by mouth 2 (two) times daily.   mirabegron ER 25 MG Tb24 tablet Commonly known  as: MYRBETRIQ Take 1 tablet (25 mg total) by mouth daily.   naratriptan 2.5 MG tablet Commonly known as: AMERGE Take 2.5 mg by mouth as needed for migraine. Take one (1) tablet at onset of headache; if returns or does not resolve, may repeat after 4 hours; do not exceed five (5) mg in 24 hours.   nitrofurantoin 50 MG capsule Commonly known as: MACRODANTIN Take 1 capsule (50 mg total) by mouth at bedtime. What changed: additional instructions   omeprazole 40 MG capsule Commonly known as: PRILOSEC Take 40 mg by mouth 2 (two) times daily.   OVER THE COUNTER MEDICATION Take 1 tablet by mouth daily. Bariatric Pal otc vitamin   oxyCODONE-acetaminophen 5-325 MG tablet Commonly known as: Percocet Take 1 tablet by mouth every 4 (four) hours as needed.   phenazopyridine 200 MG tablet Commonly known as: Pyridium Take 1 tablet (200 mg total) by mouth 3 (three) times daily as needed for pain.   promethazine 25 MG tablet Commonly known as: PHENERGAN Take 1 tablet (25 mg total) by mouth 4 (four) times daily as needed for nausea or vomiting.   QUEtiapine 50 MG tablet Commonly known as: SEROQUEL Take 50 mg by mouth at bedtime.   rizatriptan 10 MG tablet Commonly known as: MAXALT Take 10 mg by mouth as needed for  migraine.   rosuvastatin 5 MG tablet Commonly known as: CRESTOR Take 5 mg by mouth daily.   sodium bicarbonate 650 MG tablet Take 1 tablet (650 mg total) by mouth 2 (two) times daily.   sucralfate 1 g tablet Commonly known as: Carafate Take 1 tablet (1 g total) by mouth 4 (four) times daily.   sulfamethoxazole-trimethoprim 800-160 MG tablet Commonly known as: BACTRIM DS Take 1 tablet by mouth every 12 (twelve) hours.   SYSTANE OP Place 1 drop into both eyes in the morning, at noon, and at bedtime.   tamsulosin 0.4 MG Caps capsule Commonly known as: FLOMAX Take 1 capsule (0.4 mg total) by mouth daily after supper.   tiZANidine 2 MG tablet Commonly known as: ZANAFLEX Take 2 mg by mouth 3 (three) times daily.   Ubrelvy 100 MG Tabs Generic drug: Ubrogepant Take 100 mg by mouth daily as needed (migraines).        Allergies:  Allergies  Allergen Reactions   Penicillins Swelling and Rash    Rash all over [place] and lip swelling. Has patient had a PCN reaction causing immediate rash, facial/tongue/throat swelling, SOB or lightheadedness with hypotension: Yes Has patient had a PCN reaction causing severe rash involving mucus membranes or skin necrosis: Yes Has patient had a PCN reaction that required hospitalization: No If all of the above answers are "NO", then may proceed with Cephalosporin use.   Nortriptyline Other (See Comments)    Severe chest pain     Nsaids Other (See Comments)    Advised not to take due to gastric sleeve    Amoxicillin Rash    Rash all over [place]    Ciprofloxacin Diarrhea and Nausea Only    Family History: Family History  Problem Relation Age of Onset   Hypertension Mother    Melanoma Mother    Breast cancer Mother 52   Cancer - Other Father        liver   Diabetes Mellitus II Maternal Grandmother    Heart disease Maternal Grandmother    Breast cancer Maternal Grandmother    Cancer - Lung Maternal Grandfather    Osteoporosis Paternal  Grandmother  Melanoma Paternal Grandfather     Social History:  reports that she has never smoked. She has never used smokeless tobacco. She reports that she does not drink alcohol and does not use drugs.  ROS: All other review of systems were reviewed and are negative except what is noted above in HPI  Physical Exam: BP 91/66   Pulse 81   LMP 05/04/2015 (Approximate)   Constitutional:  Alert and oriented, No acute distress. HEENT: [place] AT, moist mucus membranes.  Trachea midline, no masses. Cardiovascular: No clubbing, cyanosis, or edema. Respiratory: Normal respiratory effort, no increased work of breathing. GI: Abdomen is soft, nontender, nondistended, no abdominal masses GU: No CVA tenderness.  Lymph: No cervical or inguinal lymphadenopathy. Skin: No rashes, bruises or suspicious lesions. Neurologic: Grossly intact, no focal deficits, moving all 4 extremities. Psychiatric: Normal mood and affect.  Laboratory Data: Lab Results  Component Value Date   WBC 11.0 (H) 07/09/2021   HGB 11.6 (L) 07/09/2021   HCT 35.1 (L) 07/09/2021   MCV 94.1 07/09/2021   PLT 167 07/09/2021    Lab Results  Component Value Date   CREATININE 0.67 01/17/2022    No results found for: "PSA"  No results found for: "TESTOSTERONE"  Lab Results  Component Value Date   HGBA1C 5.3 10/13/2021    Urinalysis    Component Value Date/Time   COLORURINE YELLOW (A) 06/21/2017 1735   APPEARANCEUR Clear 03/17/2022 1601   LABSPEC 1.019 06/21/2017 1735   PHURINE 5.0 06/21/2017 1735   GLUCOSEU Negative 03/17/2022 1601   HGBUR NEGATIVE 06/21/2017 1735   BILIRUBINUR Negative 03/17/2022 1601   KETONESUR NEGATIVE 06/21/2017 1735   PROTEINUR Negative 03/17/2022 1601   PROTEINUR NEGATIVE 06/21/2017 1735   UROBILINOGEN 0.2 01/27/2020 1124   UROBILINOGEN 1.0 05/18/2015 1359   NITRITE Negative 03/17/2022 1601   NITRITE NEGATIVE 06/21/2017 1735   LEUKOCYTESUR Negative 03/17/2022 1601    Lab Results   Component Value Date   LABMICR See below: 01/26/2022   WBCUA >30 (A) 01/26/2022   LABEPIT 0-10 01/26/2022   MUCUS Present 12/03/2021   BACTERIA Few 01/26/2022    Pertinent Imaging: Renal US 03/29/2022: Images reviewed and discussed with the patient  Results for orders placed during the hospital encounter of 11/27/15  DG Abd 1 View  Narrative CLINICAL DATA:  Constipation, Sitz mark study ; markers ingested 5 days ago, left upper quadrant and left lower quadrant abdominal pain.  EXAM: ABDOMEN - 1 VIEW  COMPARISON:  [facility] images of November 25, 2015  FINDINGS: There is a single Sitz marker that projects over the distal transverse colon. There are 3 Sitz markers that project in the pelvis likely in the sigmoid. The stool burden within the ascending and transverse colon is moderate. The descending and rectosigmoid colonic stool burden is relatively small. There is no small bowel obstruction. The bony structures are unremarkable.  IMPRESSION: There has been further progression of the Sitz markers. The colonic stool burden in the right and transverse colon is moderate.   Electronically Signed By: David  Martinique M.D. On: 11/27/2015 14:10  No results found for this or any previous visit.  No results found for this or any previous visit.  No results found for this or any previous visit.  Results for orders placed during the hospital encounter of 03/29/22  Ultrasound renal complete  Narrative CLINICAL DATA:  Nephrolithiasis  EXAM: RENAL / URINARY TRACT ULTRASOUND COMPLETE  COMPARISON:  Ultrasound August 31, 2021  FINDINGS: Right Kidney:  Renal measurements:  11.0 x 4.8 x 5.7 cm = volume: 158 mL. Echogenicity within normal limits. No mass or hydronephrosis visualized.  Left Kidney:  Renal measurements: 11.4 x 6.0 x 5.3 cm = volume: 188 mL. Contains a 10 mm simple cyst of no clinical significance. No follow-up imaging recommended for the  cyst.  Bladder:  Appears normal for degree of bladder distention.  Other:  None.  IMPRESSION: No suspicious findings. No renal stones or obstruction. The kidneys and bladder are unremarkable.   Electronically Signed By: Dorise Bullion III M.D. On: 03/29/2022 15:25  No results found for this or any previous visit.  No results found for this or any previous visit.  Results for orders placed during the hospital encounter of 10/13/21  CT RENAL STONE STUDY  Narrative CLINICAL DATA:  Right-sided flank pain and hematuria for 1 month. Nephrolithiasis.  EXAM: CT ABDOMEN AND PELVIS WITHOUT CONTRAST  TECHNIQUE: Multidetector CT imaging of the abdomen and pelvis was performed following the standard protocol without IV contrast.  RADIATION DOSE REDUCTION: This exam was performed according to the departmental dose-optimization program which includes automated exposure control, adjustment of the mA and/or kV according to patient size and/or use of iterative reconstruction technique.  COMPARISON:  01/01/2020  FINDINGS: Lower chest: No acute findings.  Hepatobiliary: No mass visualized on this unenhanced exam. Gallbladder is unremarkable. No evidence of biliary ductal dilatation.  Pancreas: No mass or inflammatory process visualized on this unenhanced exam.  Spleen:  Within normal limits in size.  Adrenals/Urinary tract: Several tiny less than 5 mm renal calculi are seen bilaterally. No evidence of ureteral calculi or hydronephrosis. Unremarkable unopacified urinary bladder.  Stomach/Bowel: Postop changes again seen from previous subtotal colectomy and sleeve gastrectomy. No evidence of obstruction, inflammatory process, or abnormal fluid collections.  Vascular/Lymphatic: 14 x 11 mm mesenteric soft tissue nodule or lymph node is again seen in the right abdominal mesentery on image 64/2, stable since prior exam. No other pathologically enlarged lymph nodes  identified. No evidence of abdominal aortic aneurysm.  Reproductive: Prior hysterectomy noted. Adnexal regions are unremarkable in appearance.  Other:  None.  Musculoskeletal: No suspicious bone lesions identified. Stable small sclerotic bone lesion in L4 vertebral body compared to previous CT in 2016, consistent with benign etiology.  IMPRESSION: Tiny bilateral renal calculi. No evidence of ureteral calculi, hydronephrosis, or other acute findings.  Stable small right abdominal mesenteric lymph node or soft tissue nodule. Recommend continued follow-up by CT in 6-12 months.   Electronically Signed By: Marlaine Hind M.D. On: 10/13/2021 12:38   Assessment & Plan:    1. Kidney stones -RTC 6 months with a renal US - Urinalysis, Routine w reflex microscopic - Ultrasound renal complete; Future - sodium bicarbonate 650 MG tablet; Take 1 tablet (650 mg total) by mouth 2 (two) times daily.  Dispense: 60 tablet; Refill: 11  2. Chronic cystitis with hematuria -Continue macrodantin '50mg'$  qhs     No follow-ups on file.  Nicolette Bang, MD  [facility] [place]

## 2022-04-25 DIAGNOSIS — S93401A Sprain of unspecified ligament of right ankle, initial encounter: Secondary | ICD-10-CM | POA: Diagnosis not present

## 2022-04-26 ENCOUNTER — Encounter: Payer: Self-pay | Admitting: Urology

## 2022-04-26 NOTE — Patient Instructions (Signed)
Dietary Guidelines to Help Prevent Kidney Stones Kidney stones are deposits of minerals and salts that form inside your kidneys. Your risk of developing kidney stones may be greater depending on your diet, your lifestyle, the medicines you take, and whether you have certain medical conditions. Most people can lower their chances of developing kidney stones by following the instructions below. Your dietitian may give you more specific instructions depending on your overall health and the type of kidney stones you tend to develop. What are tips for following this plan? Reading food labels  Choose foods with "no salt added" or "low-salt" labels. Limit your salt (sodium) intake to less than 1,500 mg a day. Choose foods with calcium for each meal and snack. Try to eat about 300 mg of calcium at each meal. Foods that contain 200-500 mg of calcium a serving include: 8 oz (237 mL) of milk, calcium-fortifiednon-dairy milk, and calcium-fortifiedfruit juice. Calcium-fortified means that calcium has been added to these drinks. 8 oz (237 mL) of kefir, yogurt, and soy yogurt. 4 oz (114 g) of tofu. 1 oz (28 g) of cheese. 1 cup (150 g) of dried figs. 1 cup (91 g) of cooked broccoli. One 3 oz (85 g) can of sardines or mackerel. Most people need 1,000-1,500 mg of calcium a day. Talk to your dietitian about how much calcium is recommended for you. Shopping Buy plenty of fresh fruits and vegetables. Most people do not need to avoid fruits and vegetables, even if these foods contain nutrients that may contribute to kidney stones. When shopping for convenience foods, choose: Whole pieces of fruit. Pre-made salads with dressing on the side. Low-fat fruit and yogurt smoothies. Avoid buying frozen meals or prepared deli foods. These can be high in sodium. Look for foods with live cultures, such as yogurt and kefir. Choose high-fiber grains, such as whole-wheat breads, oat bran, and wheat cereals. Cooking Do not add  salt to food when cooking. Place a salt shaker on the table and allow each person to add his or her own salt to taste. Use vegetable protein, such as beans, textured vegetable protein (TVP), or tofu, instead of meat in pasta, casseroles, and soups. Meal planning Eat less salt, if told by your dietitian. To do this: Avoid eating processed or pre-made food. Avoid eating fast food. Eat less animal protein, including cheese, meat, poultry, or fish, if told by your dietitian. To do this: Limit the number of times you have meat, poultry, fish, or cheese each week. Eat a diet free of meat at least 2 days a week. Eat only one serving each day of meat, poultry, fish, or seafood. When you prepare animal protein, cut pieces into small portion sizes. For most meat and fish, one serving is about the size of the palm of your hand. Eat at least five servings of fresh fruits and vegetables each day. To do this: Keep fruits and vegetables on hand for snacks. Eat one piece of fruit or a handful of berries with breakfast. Have a salad and fruit at lunch. Have two kinds of vegetables at dinner. Limit foods that are high in a substance called oxalate. These include: Spinach (cooked), rhubarb, beets, sweet potatoes, and Swiss chard. Peanuts. Potato chips, french fries, and baked potatoes with skin on. Nuts and nut products. Chocolate. If you regularly take a diuretic medicine, make sure to eat at least 1 or 2 servings of fruits or vegetables that are high in potassium each day. These include: Avocado. Banana. Orange, prune,   carrot, or tomato juice. Baked potato. Cabbage. Beans and split peas. Lifestyle  Drink enough fluid to keep your urine pale yellow. This is the most important thing you can do. Spread your fluid intake throughout the day. If you drink alcohol: Limit how much you use to: 0-1 drink a day for women who are not pregnant. 0-2 drinks a day for men. Be aware of how much alcohol is in your  drink. In the [place] one drink equals one 12 oz bottle of beer (355 mL), one 5 oz glass of wine (148 mL), or one 1 oz glass of hard liquor (44 mL). Lose weight if told by your health care provider. Work with your dietitian to find an eating plan and weight loss strategies that work best for you. General information Talk to your health care provider and dietitian about taking daily supplements. You may be told the following depending on your health and the cause of your kidney stones: Not to take supplements with vitamin C. To take a calcium supplement. To take a daily probiotic supplement. To take other supplements such as magnesium, fish oil, or vitamin B6. Take over-the-counter and prescription medicines only as told by your health care provider. These include supplements. What foods should I limit? Limit your intake of the following foods, or eat them as told by your dietitian. Vegetables Spinach. Rhubarb. Beets. Canned vegetables. Pickles. Olives. Baked potatoes with skin. Grains Wheat bran. Baked goods. Salted crackers. Cereals high in sugar. Meats and other proteins Nuts. Nut butters. Large portions of meat, poultry, or fish. Salted, precooked, or cured meats, such as sausages, meat loaves, and hot dogs. Dairy Cheese. Beverages Regular soft drinks. Regular vegetable juice. Seasonings and condiments Seasoning blends with salt. Salad dressings. Soy sauce. Ketchup. Barbecue sauce. Other foods Canned soups. Canned pasta sauce. Casseroles. Pizza. Lasagna. Frozen meals. Potato chips. French fries. The items listed above may not be a complete list of foods and beverages you should limit. Contact a dietitian for more information. What foods should I avoid? Talk to your dietitian about specific foods you should avoid based on the type of kidney stones you have and your overall health. Fruits Grapefruit. The item listed above may not be a complete list of foods and beverages you should  avoid. Contact a dietitian for more information. Summary Kidney stones are deposits of minerals and salts that form inside your kidneys. You can lower your risk of kidney stones by making changes to your diet. The most important thing you can do is drink enough fluid. Drink enough fluid to keep your urine pale yellow. Talk to your dietitian about how much calcium you should have each day, and eat less salt and animal protein as told by your dietitian. This information is not intended to replace advice given to you by your health care provider. Make sure you discuss any questions you have with your health care provider. Document Revised: 05/31/2021 Document Reviewed: 05/31/2021 Elsevier Patient Education  2023 [employer]  

## 2022-04-27 DIAGNOSIS — D849 Immunodeficiency, unspecified: Secondary | ICD-10-CM | POA: Diagnosis not present

## 2022-04-27 DIAGNOSIS — R0981 Nasal congestion: Secondary | ICD-10-CM | POA: Diagnosis not present

## 2022-04-27 DIAGNOSIS — J069 Acute upper respiratory infection, unspecified: Secondary | ICD-10-CM | POA: Diagnosis not present

## 2022-04-27 DIAGNOSIS — R509 Fever, unspecified: Secondary | ICD-10-CM | POA: Diagnosis not present

## 2022-04-27 DIAGNOSIS — Z03818 Encounter for observation for suspected exposure to other biological agents ruled out: Secondary | ICD-10-CM | POA: Diagnosis not present

## 2022-04-27 DIAGNOSIS — J029 Acute pharyngitis, unspecified: Secondary | ICD-10-CM | POA: Diagnosis not present

## 2022-05-18 ENCOUNTER — Ambulatory Visit: Payer: Medicare HMO | Admitting: Podiatry

## 2022-05-18 DIAGNOSIS — G5791 Unspecified mononeuropathy of right lower limb: Secondary | ICD-10-CM | POA: Diagnosis not present

## 2022-05-18 DIAGNOSIS — G5730 Lesion of lateral popliteal nerve, unspecified lower limb: Secondary | ICD-10-CM | POA: Diagnosis not present

## 2022-05-23 NOTE — Progress Notes (Signed)
  Subjective:  Patient ID: Denise Avila, female    DOB: 1976-09-09,  MRN: [id number]  Chief Complaint  Patient presents with   Tendonitis    46 y.o. female presents with the above complaint. History confirmed with patient.  She has had significant worsening right lateral foot pain for about 1 year now.  Her primary did x-rays in November which did not show any fracture or osseous issues, she was sent to physical therapy and this made it worse.  The pain is along the peroneal tendon.  Burns while sitting in an stabs and walking.  The left foot is also painful nearly as bad   Interval history: Overall feels little bit worse.  She started to have symptoms in the left side as well, she completed the ultrasound and nerve testing  Objective:  Physical Exam: warm, good capillary refill, no trophic changes or ulcerative lesions, normal DP and PT pulses, normal sensory exam, continue pain to palpation along the sural nerve, also has pain at the peroneal nerve at fibular neck  Radiographs: Multiple views x-ray of the right foot: no fracture, dislocation, swelling or degenerative changes noted and new radiographs taken today show no osseous abnormalities in the area of concern  IMPRESSION:: IMPRESSION: 1. Normal appearance of the peroneal tendons. 2. Mild attenuation of the anterior talofibular ligament may represent the patient's normal anatomy versus a remote partial-thickness tear. 3. Mild talonavicular osteoarthritis.     Electronically Signed   By: Yvonne Kendall M.D.   On: 02/09/2022 12:30   Ultrasound completed showed normal appearance of the peroneal tendons and sural nerve, confirming results of MRI    EMG/NCV study on  04/09/2022 with Dr. 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 Entrapment neuropathy of common peroneal nerve   2. 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  We reviewed the results of the ultrasound and the nerve testing.  Her symptoms continue to worsen and have not improved.  This is exacerbated as well by her fibromyalgia, but she certainly does have findings on her nerve conduction studies showing entrapment and neuropathic changes.  I am not sure if procedures or surgical intervention are available to help with this, unfortunately it is out of my scope to treat.  Fortunately nothing appears to be acutely wrong within the foot or ankle itself.  I will discuss with Dr. Murvin Natal if he has any further treatment options for her at their group or possibly with neurosurgery as well.  I will let her know what we are able to find for her.  Return to see me as needed  Return if symptoms worsen or fail to improve.

## 2022-05-24 DIAGNOSIS — R0789 Other chest pain: Secondary | ICD-10-CM | POA: Diagnosis not present

## 2022-05-24 DIAGNOSIS — M79644 Pain in right finger(s): Secondary | ICD-10-CM | POA: Diagnosis not present

## 2022-05-24 DIAGNOSIS — M797 Fibromyalgia: Secondary | ICD-10-CM | POA: Diagnosis not present

## 2022-05-24 DIAGNOSIS — M533 Sacrococcygeal disorders, not elsewhere classified: Secondary | ICD-10-CM | POA: Diagnosis not present

## 2022-05-24 DIAGNOSIS — M5441 Lumbago with sciatica, right side: Secondary | ICD-10-CM | POA: Diagnosis not present

## 2022-05-24 DIAGNOSIS — G894 Chronic pain syndrome: Secondary | ICD-10-CM | POA: Diagnosis not present

## 2022-05-26 DIAGNOSIS — F4310 Post-traumatic stress disorder, unspecified: Secondary | ICD-10-CM | POA: Diagnosis not present

## 2022-05-26 DIAGNOSIS — R1031 Right lower quadrant pain: Secondary | ICD-10-CM | POA: Diagnosis not present

## 2022-05-26 DIAGNOSIS — F411 Generalized anxiety disorder: Secondary | ICD-10-CM | POA: Diagnosis not present

## 2022-05-26 DIAGNOSIS — R197 Diarrhea, unspecified: Secondary | ICD-10-CM | POA: Diagnosis not present

## 2022-05-26 DIAGNOSIS — F331 Major depressive disorder, recurrent, moderate: Secondary | ICD-10-CM | POA: Diagnosis not present

## 2022-05-26 DIAGNOSIS — R1032 Left lower quadrant pain: Secondary | ICD-10-CM | POA: Diagnosis not present

## 2022-05-26 DIAGNOSIS — G2581 Restless legs syndrome: Secondary | ICD-10-CM | POA: Diagnosis not present

## 2022-05-26 NOTE — Addendum Note (Signed)
Addended bySherryle Lis, Chasmine Lender R on: 05/26/2022 07:48 AM   Modules accepted: Orders, Level of Service

## 2022-05-26 NOTE — Progress Notes (Signed)
Completed.

## 2022-05-30 ENCOUNTER — Telehealth: Payer: Self-pay

## 2022-05-30 NOTE — Telephone Encounter (Signed)
Patient requesting Myrbetriq samples, 1 month '50mg'$  samples at front desk for patient to pick up.

## 2022-06-01 ENCOUNTER — Telehealth: Payer: Self-pay | Admitting: *Deleted

## 2022-06-01 NOTE — Telephone Encounter (Signed)
Per [facility] had received the patients paperwork on 05/26/22 and they were in the process of working to get the patient scheduled.

## 2022-06-01 NOTE — Telephone Encounter (Signed)
Patient calling for status of neurosurgery referral. Faxed and called [facility], confirmation has been received 06/01/22. Patient has been contacted and given their # to call if have not heard anything by middle of next week.

## 2022-06-01 NOTE — Telephone Encounter (Signed)
Patient had not heard anything from them,re-faxed referral.called and they received and are in process for scheduling.

## 2022-06-07 ENCOUNTER — Other Ambulatory Visit: Payer: Self-pay | Admitting: Physician Assistant

## 2022-06-07 DIAGNOSIS — Z1231 Encounter for screening mammogram for malignant neoplasm of breast: Secondary | ICD-10-CM

## 2022-06-09 DIAGNOSIS — M5416 Radiculopathy, lumbar region: Secondary | ICD-10-CM | POA: Diagnosis not present

## 2022-06-09 DIAGNOSIS — Z6831 Body mass index (BMI) 31.0-31.9, adult: Secondary | ICD-10-CM | POA: Diagnosis not present

## 2022-06-09 DIAGNOSIS — G5731 Lesion of lateral popliteal nerve, right lower limb: Secondary | ICD-10-CM | POA: Diagnosis not present

## 2022-06-13 ENCOUNTER — Ambulatory Visit
Admission: RE | Admit: 2022-06-13 | Discharge: 2022-06-13 | Disposition: A | Discharge status: Home / Self Care | Payer: Medicare HMO | Source: Ambulatory Visit | Attending: Physician Assistant | Admitting: Physician Assistant

## 2022-06-13 ENCOUNTER — Other Ambulatory Visit: Payer: Self-pay | Admitting: Physician Assistant

## 2022-06-13 DIAGNOSIS — Z1231 Encounter for screening mammogram for malignant neoplasm of breast: Secondary | ICD-10-CM

## 2022-06-13 DIAGNOSIS — N644 Mastodynia: Secondary | ICD-10-CM

## 2022-06-15 DIAGNOSIS — M5416 Radiculopathy, lumbar region: Secondary | ICD-10-CM | POA: Diagnosis not present

## 2022-06-15 DIAGNOSIS — M5136 Other intervertebral disc degeneration, lumbar region: Secondary | ICD-10-CM | POA: Diagnosis not present

## 2022-06-15 DIAGNOSIS — M5127 Other intervertebral disc displacement, lumbosacral region: Secondary | ICD-10-CM | POA: Diagnosis not present

## 2022-06-17 DIAGNOSIS — G43011 Migraine without aura, intractable, with status migrainosus: Secondary | ICD-10-CM | POA: Diagnosis not present

## 2022-06-17 DIAGNOSIS — K5900 Constipation, unspecified: Secondary | ICD-10-CM | POA: Diagnosis not present

## 2022-06-17 DIAGNOSIS — R1031 Right lower quadrant pain: Secondary | ICD-10-CM | POA: Diagnosis not present

## 2022-06-17 DIAGNOSIS — N200 Calculus of kidney: Secondary | ICD-10-CM | POA: Diagnosis not present

## 2022-06-17 DIAGNOSIS — R197 Diarrhea, unspecified: Secondary | ICD-10-CM | POA: Diagnosis not present

## 2022-06-17 DIAGNOSIS — Z90710 Acquired absence of both cervix and uterus: Secondary | ICD-10-CM | POA: Diagnosis not present

## 2022-06-17 DIAGNOSIS — R1032 Left lower quadrant pain: Secondary | ICD-10-CM | POA: Diagnosis not present

## 2022-06-21 DIAGNOSIS — Z6831 Body mass index (BMI) 31.0-31.9, adult: Secondary | ICD-10-CM | POA: Diagnosis not present

## 2022-06-21 DIAGNOSIS — M5416 Radiculopathy, lumbar region: Secondary | ICD-10-CM | POA: Diagnosis not present

## 2022-06-24 DIAGNOSIS — F331 Major depressive disorder, recurrent, moderate: Secondary | ICD-10-CM | POA: Diagnosis not present

## 2022-06-27 DIAGNOSIS — G5781 Other specified mononeuropathies of right lower limb: Secondary | ICD-10-CM | POA: Diagnosis not present

## 2022-06-27 DIAGNOSIS — M21371 Foot drop, right foot: Secondary | ICD-10-CM | POA: Diagnosis not present

## 2022-06-27 DIAGNOSIS — G5731 Lesion of lateral popliteal nerve, right lower limb: Secondary | ICD-10-CM | POA: Diagnosis not present

## 2022-06-28 ENCOUNTER — Other Ambulatory Visit: Payer: Medicare HMO

## 2022-07-12 DIAGNOSIS — Z6830 Body mass index (BMI) 30.0-30.9, adult: Secondary | ICD-10-CM | POA: Diagnosis not present

## 2022-07-12 DIAGNOSIS — G5732 Lesion of lateral popliteal nerve, left lower limb: Secondary | ICD-10-CM | POA: Diagnosis not present

## 2022-07-12 DIAGNOSIS — G5731 Lesion of lateral popliteal nerve, right lower limb: Secondary | ICD-10-CM | POA: Diagnosis not present

## 2022-07-18 ENCOUNTER — Ambulatory Visit
Admission: RE | Admit: 2022-07-18 | Discharge: 2022-07-18 | Disposition: A | Discharge status: Home / Self Care | Payer: Medicare HMO | Source: Ambulatory Visit | Attending: Physician Assistant | Admitting: Physician Assistant

## 2022-07-18 DIAGNOSIS — N644 Mastodynia: Secondary | ICD-10-CM | POA: Diagnosis not present

## 2022-07-19 ENCOUNTER — Telehealth: Payer: Self-pay

## 2022-07-19 DIAGNOSIS — N200 Calculus of kidney: Secondary | ICD-10-CM

## 2022-07-19 NOTE — Telephone Encounter (Signed)
Patient left voicemail stating she had a question and would like a call back.  Returned call with no answer, left vm to return call.

## 2022-07-25 NOTE — Addendum Note (Signed)
Addended by: Audie Box on: 07/25/2022 03:02 PM   Modules accepted: Orders

## 2022-07-25 NOTE — Telephone Encounter (Addendum)
Patient states she had a CT done at [facility] that showed she had a kidney stone.  She is having some back pain, she will have imaging mailed to the office.  Scheduled apt with Dr. Alyson Ingles on 11/01, she will call prior to be sure images are here.  She also wanted to see if she could have more samples of Myrbetriq '50mg'$ , samples left at front desk.  [facility] ordered and patient aware to have done prior to 11/01 apt.

## 2022-07-29 DIAGNOSIS — F411 Generalized anxiety disorder: Secondary | ICD-10-CM | POA: Diagnosis not present

## 2022-08-03 ENCOUNTER — Ambulatory Visit: Payer: Medicare HMO | Admitting: Urology

## 2022-08-05 ENCOUNTER — Ambulatory Visit: Payer: Medicare HMO | Admitting: Urology

## 2022-08-11 ENCOUNTER — Ambulatory Visit (HOSPITAL_COMMUNITY)
Admission: RE | Admit: 2022-08-11 | Discharge: 2022-08-11 | Disposition: A | Discharge status: Home / Self Care | Payer: Medicare HMO | Source: Ambulatory Visit | Attending: Urology | Admitting: Urology

## 2022-08-11 DIAGNOSIS — N200 Calculus of kidney: Secondary | ICD-10-CM | POA: Diagnosis not present

## 2022-08-11 DIAGNOSIS — R1030 Lower abdominal pain, unspecified: Secondary | ICD-10-CM | POA: Diagnosis not present

## 2022-08-12 ENCOUNTER — Ambulatory Visit: Payer: Medicare HMO | Admitting: Urology

## 2022-08-29 DIAGNOSIS — F411 Generalized anxiety disorder: Secondary | ICD-10-CM | POA: Diagnosis not present

## 2022-09-01 DIAGNOSIS — G5732 Lesion of lateral popliteal nerve, left lower limb: Secondary | ICD-10-CM | POA: Diagnosis not present

## 2022-09-07 ENCOUNTER — Ambulatory Visit (HOSPITAL_COMMUNITY)
Admission: RE | Admit: 2022-09-07 | Discharge: 2022-09-07 | Disposition: A | Discharge status: Home / Self Care | Payer: Medicare HMO | Source: Ambulatory Visit | Attending: Urology | Admitting: Urology

## 2022-09-07 ENCOUNTER — Other Ambulatory Visit: Payer: Self-pay

## 2022-09-07 ENCOUNTER — Ambulatory Visit: Payer: Medicare HMO | Admitting: Urology

## 2022-09-07 VITALS — BP 103/69 | HR 114

## 2022-09-07 DIAGNOSIS — N3021 Other chronic cystitis with hematuria: Secondary | ICD-10-CM

## 2022-09-07 DIAGNOSIS — I878 Other specified disorders of veins: Secondary | ICD-10-CM | POA: Diagnosis not present

## 2022-09-07 DIAGNOSIS — N200 Calculus of kidney: Secondary | ICD-10-CM | POA: Diagnosis not present

## 2022-09-07 LAB — URINALYSIS, ROUTINE W REFLEX MICROSCOPIC
Bilirubin, UA: NEGATIVE
Glucose, UA: NEGATIVE
Ketones, UA: NEGATIVE
Nitrite, UA: NEGATIVE
Protein,UA: NEGATIVE
RBC, UA: NEGATIVE
Specific Gravity, UA: 1.015 (ref 1.005–1.030)
Urobilinogen, Ur: 0.2 mg/dL (ref 0.2–1.0)
pH, UA: 7 (ref 5.0–7.5)

## 2022-09-07 LAB — MICROSCOPIC EXAMINATION

## 2022-09-07 MED ORDER — OXYCODONE HCL 5 MG PO TABS: 5.0000 mg | ORAL_TABLET | ORAL | 0 refills | Status: DC | PRN

## 2022-09-07 MED ORDER — PHENAZOPYRIDINE HCL 200 MG PO TABS: 200.0000 mg | ORAL_TABLET | Freq: Three times a day (TID) | ORAL | 0 refills | Status: DC | PRN

## 2022-09-07 MED ORDER — OXYCODONE-ACETAMINOPHEN 5-325 MG PO TABS: 1.0000 | ORAL_TABLET | ORAL | 0 refills | Status: DC | PRN

## 2022-09-07 NOTE — Progress Notes (Signed)
I spoke with Ms. Williard. We have discussed possible surgery dates and 09/13/2022 was agreed upon by all parties. Patient given information about surgery date, what to expect pre-operatively and post operatively.    We discussed that a pre-op nurse will be calling to set up the pre-op visit that will take place prior to surgery. Informed patient that our office will communicate any additional care to be provided after surgery.    Patients questions or concerns were discussed during our call. Advised to call our office should there be any additional information, questions or concerns that arise. Patient verbalized understanding.

## 2022-09-07 NOTE — H&P (View-Only) (Signed)
09/07/2022 11:35 AM   Denise Avila 03-Jul-1976 [id number]  Referring provider: Lennie Odor, [place] [employer] [place],  [place] [id number]  Followup chronic cystitis and nephrolithiasis   HPI: Denise Avila is a 46yo here for followup for nephrolithiasis and chronic cystitis. UA today shows 6-10 WBCs and few bacteria. KUB shows left lower pole calculus. She has intermittent left flank pain that is sharp, intermittent, mild to moderate and nonraditing.    PMH: Past Medical History:  Diagnosis Date   Abnormal Pap smear of cervix 12/2014   Acid reflux    Adrenal insufficiency (HCC)    Anxiety    Arthritis    Arthritis    Depression    Depression    Endometriosis    Fibroids    age 74   Fibromyalgia    High cholesterol    History of kidney stones    Liver disease    per pt report, will have biopsy Nov 08, 2017   Migraines    Pleurisy 02/2015   while hospitalized    Seizure ([place])    on meds and no seizures in 2-3 years. Unknown etiology   Skin cancer (melanoma) ([place])    Syncope and collapse    Uterine cancer [facility])     Surgical History: Past Surgical History:  Procedure Laterality Date   ABDOMINAL HYSTERECTOMY     ABLATION     uterine   COLECTOMY     CYSTOSCOPY W/ URETERAL STENT PLACEMENT Left 02/02/2015   Procedure: CYSTOSCOPY WITH RETROGRADE PYELOGRAM/URETERAL STENT PLACEMENT;  Surgeon: Festus Aloe, MD;  Location: [facility];  Service: Urology;  Laterality: Left;   CYSTOSCOPY WITH RETROGRADE PYELOGRAM, URETEROSCOPY AND STENT PLACEMENT Bilateral 01/20/2022   Procedure: CYSTOSCOPY WITH RETROGRADE PYELOGRAM, URETEROSCOPY AND STENT PLACEMENT;  Surgeon: Cleon Gustin, MD;  Location: [facility];  Service: Urology;  Laterality: Bilateral;   CYSTOSCOPY WITH URETEROSCOPY AND STENT PLACEMENT Left 02/13/2015   Procedure: LEFT URETEROSCOPY WITH HOLMIUM LASER AND STENT PLACEMENT;  Surgeon: Festus Aloe, MD;  Location: [facility];  Service: Urology;   Laterality: Left;   DILATION AND CURETTAGE OF UTERUS     ESOPHAGOGASTRODUODENOSCOPY ENDOSCOPY  10/11/2017   @ [facility]; BIOPSY OF LOWER INTESTINE   HOLMIUM LASER APPLICATION N/A 80/12/4915   Procedure: HOLMIUM LASER APPLICATION;  Surgeon: Festus Aloe, MD;  Location: [facility];  Service: Urology;  Laterality: N/A;   IR NEPHROSTOMY PLACEMENT RIGHT  07/05/2021   KNEE SURGERY Bilateral 2019   realignment of knees   LAPAROSCOPIC GASTRIC SLEEVE RESECTION     LAPAROSCOPY     x 4   MELANOMA EXCISION  2000   melanoma removed from face, basal cell removed from nose   NEPHROLITHOTOMY Right 07/08/2021   Procedure: NEPHROLITHOTOMY PERCUTANEOUS- with stent placement;  Surgeon: Cleon Gustin, MD;  Location: [facility];  Service: Urology;  Laterality: Right;   REPAIR VAGINAL CUFF N/A 08/21/2015   Procedure: REPAIR VAGINAL CUFF, EXAM UNDER ANESTHESIA;  Surgeon: Everitt Amber, MD;  Location: [facility];  Service: Gynecology;  Laterality: N/A;   ROBOTIC ASSISTED TOTAL HYSTERECTOMY WITH BILATERAL SALPINGO OOPHERECTOMY Bilateral 05/21/2015   Procedure: XI ROBOTIC ASSISTED TOTAL HYSTERECTOMY WITH BILATERAL SALPINGO OOPHORECTOMY;  Surgeon: Everitt Amber, MD;  Location: [facility];  Service: Gynecology;  Laterality: Bilateral;   STONE EXTRACTION WITH BASKET Bilateral 01/20/2022   Procedure: STONE EXTRACTION WITH BASKET;  Surgeon: Cleon Gustin, MD;  Location: [facility];  Service: Urology;  Laterality: Bilateral;    Home Medications:  Allergies  as of 09/07/2022       Reactions   Penicillins Swelling, Rash   Rash all over [place] and lip swelling. Has patient had a PCN reaction causing immediate rash, facial/tongue/throat swelling, SOB or lightheadedness with hypotension: Yes Has patient had a PCN reaction causing severe rash involving mucus membranes or skin necrosis: Yes Has patient had a PCN reaction that required hospitalization: No If all of the above answers are "NO", then may proceed with Cephalosporin use.    Nortriptyline Other (See Comments)   Severe chest pain    Nsaids Other (See Comments)   Advised not to take due to gastric sleeve    Amoxicillin Rash   Rash all over [place]   Ciprofloxacin Diarrhea, Nausea Only        Medication List        Accurate as of September 07, 2022 11:35 AM. If you have any questions, ask your nurse or doctor.          acetaminophen 500 MG tablet Commonly known as: TYLENOL Take 1,000 mg by mouth every 6 (six) hours as needed for moderate pain.   allopurinol 300 MG tablet Commonly known as: Zyloprim Take 1 tablet (300 mg total) by mouth daily.   baclofen 10 MG tablet Commonly known as: LIORESAL Take 10 mg by mouth 3 (three) times daily.   celecoxib 200 MG capsule Commonly known as: CELEBREX Take 200 mg by mouth daily.   citalopram 10 MG tablet Commonly known as: CELEXA Take 10 mg by mouth daily.   clonazePAM 1 MG tablet Commonly known as: KLONOPIN Take 1 mg by mouth at bedtime.   diphenhydrAMINE 25 MG tablet Commonly known as: BENADRYL Take 25 mg by mouth every 6 (six) hours as needed (take with promethazine for migraines).   doxycycline 100 MG EC tablet Commonly known as: DORYX Take 1 tablet (100 mg total) by mouth 2 (two) times daily.   DULoxetine 60 MG capsule Commonly known as: CYMBALTA Take 60 mg by mouth at bedtime.   DULoxetine 30 MG capsule Commonly known as: CYMBALTA Take 30 mg by mouth in the morning.   eletriptan 40 MG tablet Commonly known as: RELPAX Take 40 mg by mouth daily as needed for migraine.   ezetimibe 10 MG tablet Commonly known as: ZETIA Take 10 mg by mouth daily.   fluconazole 100 MG tablet Commonly known as: DIFLUCAN Take 1 tablet (100 mg total) by mouth daily. X 7 days   gabapentin 800 MG tablet Commonly known as: NEURONTIN Take 2 tablets (1,600 mg total) by mouth 2 (two) times daily.   GAS-X PO Take 1-2 tablets by mouth 3 (three) times daily as needed (gas).   HYDROmorphone 4 MG  tablet Commonly known as: Dilaudid Take 1 tablet (4 mg total) by mouth every 4 (four) hours as needed for severe pain.   lamoTRIgine 200 MG tablet Commonly known as: LAMICTAL Take 200 mg by mouth every evening.   levocetirizine 5 MG tablet Commonly known as: XYZAL Take 5 mg by mouth every evening.   methenamine 1 g tablet Commonly known as: MANDELAMINE Take 1 tablet (1,000 mg total) by mouth 2 (two) times daily.   mirabegron ER 25 MG Tb24 tablet Commonly known as: MYRBETRIQ Take 1 tablet (25 mg total) by mouth daily.   naratriptan 2.5 MG tablet Commonly known as: AMERGE Take 2.5 mg by mouth as needed for migraine. Take one (1) tablet at onset of headache; if returns or does not resolve, may repeat after  4 hours; do not exceed five (5) mg in 24 hours.   nitrofurantoin 50 MG capsule Commonly known as: MACRODANTIN Take 1 capsule (50 mg total) by mouth at bedtime.   omeprazole 40 MG capsule Commonly known as: PRILOSEC Take 40 mg by mouth 2 (two) times daily.   OVER THE COUNTER MEDICATION Take 1 tablet by mouth daily. Bariatric Pal otc vitamin   oxyCODONE-acetaminophen 5-325 MG tablet Commonly known as: Percocet Take 1 tablet by mouth every 4 (four) hours as needed.   phenazopyridine 200 MG tablet Commonly known as: Pyridium Take 1 tablet (200 mg total) by mouth 3 (three) times daily as needed for pain.   promethazine 25 MG tablet Commonly known as: PHENERGAN Take 1 tablet (25 mg total) by mouth 4 (four) times daily as needed for nausea or vomiting.   QUEtiapine 50 MG tablet Commonly known as: SEROQUEL Take 50 mg by mouth at bedtime.   rizatriptan 10 MG tablet Commonly known as: MAXALT Take 10 mg by mouth as needed for migraine.   rosuvastatin 5 MG tablet Commonly known as: CRESTOR Take 5 mg by mouth daily.   sodium bicarbonate 650 MG tablet Take 1 tablet (650 mg total) by mouth 2 (two) times daily.   sucralfate 1 g tablet Commonly known as: Carafate Take 1  tablet (1 g total) by mouth 4 (four) times daily.   sulfamethoxazole-trimethoprim 800-160 MG tablet Commonly known as: BACTRIM DS Take 1 tablet by mouth every 12 (twelve) hours.   SYSTANE OP Place 1 drop into both eyes in the morning, at noon, and at bedtime.   tamsulosin 0.4 MG Caps capsule Commonly known as: FLOMAX Take 1 capsule (0.4 mg total) by mouth daily after supper.   tiZANidine 2 MG tablet Commonly known as: ZANAFLEX Take 2 mg by mouth 3 (three) times daily.   Ubrelvy 100 MG Tabs Generic drug: Ubrogepant Take 100 mg by mouth daily as needed (migraines).        Allergies:  Allergies  Allergen Reactions   Penicillins Swelling and Rash    Rash all over [place] and lip swelling. Has patient had a PCN reaction causing immediate rash, facial/tongue/throat swelling, SOB or lightheadedness with hypotension: Yes Has patient had a PCN reaction causing severe rash involving mucus membranes or skin necrosis: Yes Has patient had a PCN reaction that required hospitalization: No If all of the above answers are "NO", then may proceed with Cephalosporin use.   Nortriptyline Other (See Comments)    Severe chest pain     Nsaids Other (See Comments)    Advised not to take due to gastric sleeve    Amoxicillin Rash    Rash all over [place]    Ciprofloxacin Diarrhea and Nausea Only    Family History: Family History  Problem Relation Age of Onset   Hypertension Mother    Melanoma Mother    Breast cancer Mother 70   Cancer - Other Father        liver   Diabetes Mellitus II Maternal Grandmother    Heart disease Maternal Grandmother    Breast cancer Maternal Grandmother    Cancer - Lung Maternal Grandfather    Osteoporosis Paternal Grandmother    Melanoma Paternal Grandfather     Social History:  reports that she has never smoked. She has never used smokeless tobacco. She reports that she does not drink alcohol and does not use drugs.  ROS: All other review of systems were  reviewed and are negative except what  is noted above in HPI  Physical Exam: BP 103/69   Pulse (!) 114   LMP 05/04/2015 (Approximate)   Constitutional:  Alert and oriented, No acute distress. HEENT: [place] AT, moist mucus membranes.  Trachea midline, no masses. Cardiovascular: No clubbing, cyanosis, or edema. Respiratory: Normal respiratory effort, no increased work of breathing. GI: Abdomen is soft, nontender, nondistended, no abdominal masses GU: No CVA tenderness.  Lymph: No cervical or inguinal lymphadenopathy. Skin: No rashes, bruises or suspicious lesions. Neurologic: Grossly intact, no focal deficits, moving all 4 extremities. Psychiatric: Normal mood and affect.  Laboratory Data: Lab Results  Component Value Date   WBC 11.0 (H) 07/09/2021   HGB 11.6 (L) 07/09/2021   HCT 35.1 (L) 07/09/2021   MCV 94.1 07/09/2021   PLT 167 07/09/2021    Lab Results  Component Value Date   CREATININE 0.67 01/17/2022    No results found for: "PSA"  No results found for: "TESTOSTERONE"  Lab Results  Component Value Date   HGBA1C 5.3 10/13/2021    Urinalysis    Component Value Date/Time   COLORURINE YELLOW (A) 06/21/2017 1735   APPEARANCEUR Clear 04/13/2022 1037   LABSPEC 1.019 06/21/2017 1735   PHURINE 5.0 06/21/2017 1735   GLUCOSEU Negative 04/13/2022 1037   HGBUR NEGATIVE 06/21/2017 1735   BILIRUBINUR Negative 04/13/2022 1037   KETONESUR NEGATIVE 06/21/2017 1735   PROTEINUR Negative 04/13/2022 1037   PROTEINUR NEGATIVE 06/21/2017 1735   UROBILINOGEN 0.2 01/27/2020 1124   UROBILINOGEN 1.0 05/18/2015 1359   NITRITE Negative 04/13/2022 1037   NITRITE NEGATIVE 06/21/2017 1735   LEUKOCYTESUR Negative 04/13/2022 1037    Lab Results  Component Value Date   LABMICR Comment 04/13/2022   WBCUA >30 (A) 01/26/2022   LABEPIT 0-10 01/26/2022   MUCUS Present 12/03/2021   BACTERIA Few 01/26/2022    Pertinent Imaging: KUb today: Images reviewed and discussed with the patient   Results for orders placed in visit on 07/19/22  DG Abd 1 View  Narrative CLINICAL DATA:  Kidney stone, LEFT side flank pain, lower abdominal pain  EXAM: ABDOMEN - 1 VIEW  COMPARISON:  12/14/2015  FINDINGS: Small calculus projects over inferior pole LEFT kidney, 5 mm.  Small BILATERAL pelvic phleboliths.  No additional urinary tract calcifications.  Bowel gas pattern normal.  Osseous structures unremarkable.  IMPRESSION: 5 mm LEFT renal calculus.  No additional abnormalities.   Electronically Signed By: Lavonia Dana M.D. On: 08/11/2022 16:33  No results found for this or any previous visit.  No results found for this or any previous visit.  No results found for this or any previous visit.  Results for orders placed during the hospital encounter of 03/29/22  Ultrasound renal complete  Narrative CLINICAL DATA:  Nephrolithiasis  EXAM: RENAL / URINARY TRACT ULTRASOUND COMPLETE  COMPARISON:  Ultrasound August 31, 2021  FINDINGS: Right Kidney:  Renal measurements: 11.0 x 4.8 x 5.7 cm = volume: 158 mL. Echogenicity within normal limits. No mass or hydronephrosis visualized.  Left Kidney:  Renal measurements: 11.4 x 6.0 x 5.3 cm = volume: 188 mL. Contains a 10 mm simple cyst of no clinical significance. No follow-up imaging recommended for the cyst.  Bladder:  Appears normal for degree of bladder distention.  Other:  None.  IMPRESSION: No suspicious findings. No renal stones or obstruction. The kidneys and bladder are unremarkable.   Electronically Signed By: Dorise Bullion III M.D. On: 03/29/2022 15:25  No valid procedures specified. No results found for this or any previous  visit.  Results for orders placed during the hospital encounter of 10/13/21  CT RENAL STONE STUDY  Narrative CLINICAL DATA:  Right-sided flank pain and hematuria for 1 month. Nephrolithiasis.  EXAM: CT ABDOMEN AND PELVIS WITHOUT  CONTRAST  TECHNIQUE: Multidetector CT imaging of the abdomen and pelvis was performed following the standard protocol without IV contrast.  RADIATION DOSE REDUCTION: This exam was performed according to the departmental dose-optimization program which includes automated exposure control, adjustment of the mA and/or kV according to patient size and/or use of iterative reconstruction technique.  COMPARISON:  01/01/2020  FINDINGS: Lower chest: No acute findings.  Hepatobiliary: No mass visualized on this unenhanced exam. Gallbladder is unremarkable. No evidence of biliary ductal dilatation.  Pancreas: No mass or inflammatory process visualized on this unenhanced exam.  Spleen:  Within normal limits in size.  Adrenals/Urinary tract: Several tiny less than 5 mm renal calculi are seen bilaterally. No evidence of ureteral calculi or hydronephrosis. Unremarkable unopacified urinary bladder.  Stomach/Bowel: Postop changes again seen from previous subtotal colectomy and sleeve gastrectomy. No evidence of obstruction, inflammatory process, or abnormal fluid collections.  Vascular/Lymphatic: 14 x 11 mm mesenteric soft tissue nodule or lymph node is again seen in the right abdominal mesentery on image 64/2, stable since prior exam. No other pathologically enlarged lymph nodes identified. No evidence of abdominal aortic aneurysm.  Reproductive: Prior hysterectomy noted. Adnexal regions are unremarkable in appearance.  Other:  None.  Musculoskeletal: No suspicious bone lesions identified. Stable small sclerotic bone lesion in L4 vertebral body compared to previous CT in 2016, consistent with benign etiology.  IMPRESSION: Tiny bilateral renal calculi. No evidence of ureteral calculi, hydronephrosis, or other acute findings.  Stable small right abdominal mesenteric lymph node or soft tissue nodule. Recommend continued follow-up by CT in 6-12 months.   Electronically Signed By:  Marlaine Hind M.D. On: 10/13/2021 12:38   Assessment & Plan:    1. Kidney stones -We discussed the management of kidney stones. These options include observation, ureteroscopy, shockwave lithotripsy (ESWL) and percutaneous nephrolithotomy (PCNL). We discussed which options are relevant to the patient's stone(s). We discussed the natural history of kidney stones as well as the complications of untreated stones and the impact on quality of life without treatment as well as with each of the above listed treatments. We also discussed the efficacy of each treatment in its ability to clear the stone burden. With any of these management options I discussed the signs and symptoms of infection and the need for emergent treatment should these be experienced. For each option we discussed the ability of each procedure to clear the patient of their stone burden.   For observation I described the risks which include but are not limited to silent renal damage, life-threatening infection, need for emergent surgery, failure to pass stone and pain.   For ureteroscopy I described the risks which include bleeding, infection, damage to contiguous structures, positioning injury, ureteral stricture, ureteral avulsion, ureteral injury, need for prolonged ureteral stent, inability to perform ureteroscopy, need for an interval procedure, inability to clear stone burden, stent discomfort/pain, heart attack, stroke, pulmonary embolus and the inherent risks with general anesthesia.   For shockwave lithotripsy I described the risks which include arrhythmia, kidney contusion, kidney hemorrhage, need for transfusion, pain, inability to adequately break up stone, inability to pass stone fragments, Steinstrasse, infection associated with obstructing stones, need for alternate surgical procedure, need for repeat shockwave lithotripsy, MI, CVA, PE and the inherent risks with anesthesia/conscious sedation.  For PCNL I described the risks  including positioning injury, pneumothorax, hydrothorax, need for chest tube, inability to clear stone burden, renal laceration, arterial venous fistula or malformation, need for embolization of kidney, loss of kidney or renal function, need for repeat procedure, need for prolonged nephrostomy tube, ureteral avulsion, MI, CVA, PE and the inherent risks of general anesthesia.   - The patient would like to proceed with left ESWL - Urinalysis, Routine w reflex microscopic  2. Chronic cystitis with hematuria -urine for culture - Urinalysis, Routine w reflex microscopic   No follow-ups on file.  Nicolette Bang, MD  [facility] [place]

## 2022-09-07 NOTE — Progress Notes (Signed)
09/07/2022 11:35 AM   Denise Avila 1976/08/01 [id number]  Referring provider: Lennie Odor, [place] [employer] [place],   [address]  Followup chronic cystitis and nephrolithiasis   HPI: Mr Mertz is a 46yo here for followup for nephrolithiasis and chronic cystitis. UA today shows 6-10 WBCs and few bacteria. KUB shows left lower pole calculus. She has intermittent left flank pain that is sharp, intermittent, mild to moderate and nonraditing.    PMH: Past Medical History:  Diagnosis Date   Abnormal Pap smear of cervix 12/2014   Acid reflux    Adrenal insufficiency (HCC)    Anxiety    Arthritis    Arthritis    Depression    Depression    Endometriosis    Fibroids    age 20   Fibromyalgia    High cholesterol    History of kidney stones    Liver disease    per pt report, will have biopsy Nov 08, 2017   Migraines    Pleurisy 02/2015   while hospitalized    Seizure ([place])    on meds and no seizures in 2-3 years. Unknown etiology   Skin cancer (melanoma) ([place])    Syncope and collapse    Uterine cancer [facility])     Surgical History: Past Surgical History:  Procedure Laterality Date   ABDOMINAL HYSTERECTOMY     ABLATION     uterine   COLECTOMY     CYSTOSCOPY W/ URETERAL STENT PLACEMENT Left 02/02/2015   Procedure: CYSTOSCOPY WITH RETROGRADE PYELOGRAM/URETERAL STENT PLACEMENT;  Surgeon: Festus Aloe, MD;  Location: [facility];  Service: Urology;  Laterality: Left;   CYSTOSCOPY WITH RETROGRADE PYELOGRAM, URETEROSCOPY AND STENT PLACEMENT Bilateral 01/20/2022   Procedure: CYSTOSCOPY WITH RETROGRADE PYELOGRAM, URETEROSCOPY AND STENT PLACEMENT;  Surgeon: Cleon Gustin, MD;  Location: [facility];  Service: Urology;  Laterality: Bilateral;   CYSTOSCOPY WITH URETEROSCOPY AND STENT PLACEMENT Left 02/13/2015   Procedure: LEFT URETEROSCOPY WITH HOLMIUM LASER AND STENT PLACEMENT;  Surgeon: Festus Aloe, MD;  Location: [facility];  Service: Urology;   Laterality: Left;   DILATION AND CURETTAGE OF UTERUS     ESOPHAGOGASTRODUODENOSCOPY ENDOSCOPY  10/11/2017   @ [facility]; BIOPSY OF LOWER INTESTINE   HOLMIUM LASER APPLICATION N/A 37/07/6268   Procedure: HOLMIUM LASER APPLICATION;  Surgeon: Festus Aloe, MD;  [facility];  Service: Urology;  Laterality: N/A;   IR NEPHROSTOMY PLACEMENT RIGHT  07/05/2021   KNEE SURGERY Bilateral 2019   realignment of knees   LAPAROSCOPIC GASTRIC SLEEVE RESECTION     LAPAROSCOPY     x 4   MELANOMA EXCISION  2000   melanoma removed from face, basal cell removed from nose   NEPHROLITHOTOMY Right 07/08/2021   Procedure: NEPHROLITHOTOMY PERCUTANEOUS- with stent placement;  Surgeon: Cleon Gustin, MD;  Location: [facility];  Service: Urology;  Laterality: Right;   REPAIR VAGINAL CUFF N/A 08/21/2015   Procedure: REPAIR VAGINAL CUFF, EXAM UNDER ANESTHESIA;  Surgeon: Everitt Amber, MD;  Location: [facility];  Service: Gynecology;  Laterality: N/A;   ROBOTIC ASSISTED TOTAL HYSTERECTOMY WITH BILATERAL SALPINGO OOPHERECTOMY Bilateral 05/21/2015   Procedure: XI ROBOTIC ASSISTED TOTAL HYSTERECTOMY WITH BILATERAL SALPINGO OOPHORECTOMY;  Surgeon: Everitt Amber, MD;  Location: [facility];  Service: Gynecology;  Laterality: Bilateral;   STONE EXTRACTION WITH BASKET Bilateral 01/20/2022   Procedure: STONE EXTRACTION WITH BASKET;  Surgeon: Cleon Gustin, MD;  Location: [facility];  Service: Urology;  Laterality: Bilateral;    Home Medications:  Allergies  as of 09/07/2022       Reactions   Penicillins Swelling, Rash   Rash all over [place] and lip swelling. Has patient had a PCN reaction causing immediate rash, facial/tongue/throat swelling, SOB or lightheadedness with hypotension: Yes Has patient had a PCN reaction causing severe rash involving mucus membranes or skin necrosis: Yes Has patient had a PCN reaction that required hospitalization: No If all of the above answers are "NO", then may proceed with Cephalosporin use.    Nortriptyline Other (See Comments)   Severe chest pain    Nsaids Other (See Comments)   Advised not to take due to gastric sleeve    Amoxicillin Rash   Rash all over [place]   Ciprofloxacin Diarrhea, Nausea Only        Medication List        Accurate as of September 07, 2022 11:35 AM. If you have any questions, ask your nurse or doctor.          acetaminophen 500 MG tablet Commonly known as: TYLENOL Take 1,000 mg by mouth every 6 (six) hours as needed for moderate pain.   allopurinol 300 MG tablet Commonly known as: Zyloprim Take 1 tablet (300 mg total) by mouth daily.   baclofen 10 MG tablet Commonly known as: LIORESAL Take 10 mg by mouth 3 (three) times daily.   celecoxib 200 MG capsule Commonly known as: CELEBREX Take 200 mg by mouth daily.   citalopram 10 MG tablet Commonly known as: CELEXA Take 10 mg by mouth daily.   clonazePAM 1 MG tablet Commonly known as: KLONOPIN Take 1 mg by mouth at bedtime.   diphenhydrAMINE 25 MG tablet Commonly known as: BENADRYL Take 25 mg by mouth every 6 (six) hours as needed (take with promethazine for migraines).   doxycycline 100 MG EC tablet Commonly known as: DORYX Take 1 tablet (100 mg total) by mouth 2 (two) times daily.   DULoxetine 60 MG capsule Commonly known as: CYMBALTA Take 60 mg by mouth at bedtime.   DULoxetine 30 MG capsule Commonly known as: CYMBALTA Take 30 mg by mouth in the morning.   eletriptan 40 MG tablet Commonly known as: RELPAX Take 40 mg by mouth daily as needed for migraine.   ezetimibe 10 MG tablet Commonly known as: ZETIA Take 10 mg by mouth daily.   fluconazole 100 MG tablet Commonly known as: DIFLUCAN Take 1 tablet (100 mg total) by mouth daily. X 7 days   gabapentin 800 MG tablet Commonly known as: NEURONTIN Take 2 tablets (1,600 mg total) by mouth 2 (two) times daily.   GAS-X PO Take 1-2 tablets by mouth 3 (three) times daily as needed (gas).   HYDROmorphone 4 MG  tablet Commonly known as: Dilaudid Take 1 tablet (4 mg total) by mouth every 4 (four) hours as needed for severe pain.   lamoTRIgine 200 MG tablet Commonly known as: LAMICTAL Take 200 mg by mouth every evening.   levocetirizine 5 MG tablet Commonly known as: XYZAL Take 5 mg by mouth every evening.   methenamine 1 g tablet Commonly known as: MANDELAMINE Take 1 tablet (1,000 mg total) by mouth 2 (two) times daily.   mirabegron ER 25 MG Tb24 tablet Commonly known as: MYRBETRIQ Take 1 tablet (25 mg total) by mouth daily.   naratriptan 2.5 MG tablet Commonly known as: AMERGE Take 2.5 mg by mouth as needed for migraine. Take one (1) tablet at onset of headache; if returns or does not resolve, may repeat after  4 hours; do not exceed five (5) mg in 24 hours.   nitrofurantoin 50 MG capsule Commonly known as: MACRODANTIN Take 1 capsule (50 mg total) by mouth at bedtime.   omeprazole 40 MG capsule Commonly known as: PRILOSEC Take 40 mg by mouth 2 (two) times daily.   OVER THE COUNTER MEDICATION Take 1 tablet by mouth daily. Bariatric Pal otc vitamin   oxyCODONE-acetaminophen 5-325 MG tablet Commonly known as: Percocet Take 1 tablet by mouth every 4 (four) hours as needed.   phenazopyridine 200 MG tablet Commonly known as: Pyridium Take 1 tablet (200 mg total) by mouth 3 (three) times daily as needed for pain.   promethazine 25 MG tablet Commonly known as: PHENERGAN Take 1 tablet (25 mg total) by mouth 4 (four) times daily as needed for nausea or vomiting.   QUEtiapine 50 MG tablet Commonly known as: SEROQUEL Take 50 mg by mouth at bedtime.   rizatriptan 10 MG tablet Commonly known as: MAXALT Take 10 mg by mouth as needed for migraine.   rosuvastatin 5 MG tablet Commonly known as: CRESTOR Take 5 mg by mouth daily.   sodium bicarbonate 650 MG tablet Take 1 tablet (650 mg total) by mouth 2 (two) times daily.   sucralfate 1 g tablet Commonly known as: Carafate Take 1  tablet (1 g total) by mouth 4 (four) times daily.   sulfamethoxazole-trimethoprim 800-160 MG tablet Commonly known as: BACTRIM DS Take 1 tablet by mouth every 12 (twelve) hours.   SYSTANE OP Place 1 drop into both eyes in the morning, at noon, and at bedtime.   tamsulosin 0.4 MG Caps capsule Commonly known as: FLOMAX Take 1 capsule (0.4 mg total) by mouth daily after supper.   tiZANidine 2 MG tablet Commonly known as: ZANAFLEX Take 2 mg by mouth 3 (three) times daily.   Ubrelvy 100 MG Tabs Generic drug: Ubrogepant Take 100 mg by mouth daily as needed (migraines).        Allergies:  Allergies  Allergen Reactions   Penicillins Swelling and Rash    Rash all over [place] and lip swelling. Has patient had a PCN reaction causing immediate rash, facial/tongue/throat swelling, SOB or lightheadedness with hypotension: Yes Has patient had a PCN reaction causing severe rash involving mucus membranes or skin necrosis: Yes Has patient had a PCN reaction that required hospitalization: No If all of the above answers are "NO", then may proceed with Cephalosporin use.   Nortriptyline Other (See Comments)    Severe chest pain     Nsaids Other (See Comments)    Advised not to take due to gastric sleeve    Amoxicillin Rash    Rash all over [place]    Ciprofloxacin Diarrhea and Nausea Only    Family History: Family History  Problem Relation Age of Onset   Hypertension Mother    Melanoma Mother    Breast cancer Mother 63   Cancer - Other Father        liver   Diabetes Mellitus II Maternal Grandmother    Heart disease Maternal Grandmother    Breast cancer Maternal Grandmother    Cancer - Lung Maternal Grandfather    Osteoporosis Paternal Grandmother    Melanoma Paternal Grandfather     Social History:  reports that she has never smoked. She has never used smokeless tobacco. She reports that she does not drink alcohol and does not use drugs.  ROS: All other review of systems were  reviewed and are negative except what  is noted above in HPI  Physical Exam: BP 103/69   Pulse (!) 114   LMP 05/04/2015 (Approximate)   Constitutional:  Alert and oriented, No acute distress. HEENT: [place] AT, moist mucus membranes.  Trachea midline, no masses. Cardiovascular: No clubbing, cyanosis, or edema. Respiratory: Normal respiratory effort, no increased work of breathing. GI: Abdomen is soft, nontender, nondistended, no abdominal masses GU: No CVA tenderness.  Lymph: No cervical or inguinal lymphadenopathy. Skin: No rashes, bruises or suspicious lesions. Neurologic: Grossly intact, no focal deficits, moving all 4 extremities. Psychiatric: Normal mood and affect.  Laboratory Data: Lab Results  Component Value Date   WBC 11.0 (H) 07/09/2021   HGB 11.6 (L) 07/09/2021   HCT 35.1 (L) 07/09/2021   MCV 94.1 07/09/2021   PLT 167 07/09/2021    Lab Results  Component Value Date   CREATININE 0.67 01/17/2022    No results found for: "PSA"  No results found for: "TESTOSTERONE"  Lab Results  Component Value Date   HGBA1C 5.3 10/13/2021    Urinalysis    Component Value Date/Time   COLORURINE YELLOW (A) 06/21/2017 1735   APPEARANCEUR Clear 04/13/2022 1037   LABSPEC 1.019 06/21/2017 1735   PHURINE 5.0 06/21/2017 1735   GLUCOSEU Negative 04/13/2022 1037   HGBUR NEGATIVE 06/21/2017 1735   BILIRUBINUR Negative 04/13/2022 1037   KETONESUR NEGATIVE 06/21/2017 1735   PROTEINUR Negative 04/13/2022 1037   PROTEINUR NEGATIVE 06/21/2017 1735   UROBILINOGEN 0.2 01/27/2020 1124   UROBILINOGEN 1.0 05/18/2015 1359   NITRITE Negative 04/13/2022 1037   NITRITE NEGATIVE 06/21/2017 1735   LEUKOCYTESUR Negative 04/13/2022 1037    Lab Results  Component Value Date   LABMICR Comment 04/13/2022   WBCUA >30 (A) 01/26/2022   LABEPIT 0-10 01/26/2022   MUCUS Present 12/03/2021   BACTERIA Few 01/26/2022    Pertinent Imaging: KUb today: Images reviewed and discussed with the patient   Results for orders placed in visit on 07/19/22  DG Abd 1 View  Narrative CLINICAL DATA:  Kidney stone, LEFT side flank pain, lower abdominal pain  EXAM: ABDOMEN - 1 VIEW  COMPARISON:  12/14/2015  FINDINGS: Small calculus projects over inferior pole LEFT kidney, 5 mm.  Small BILATERAL pelvic phleboliths.  No additional urinary tract calcifications.  Bowel gas pattern normal.  Osseous structures unremarkable.  IMPRESSION: 5 mm LEFT renal calculus.  No additional abnormalities.   Electronically Signed By: Lavonia Dana M.D. On: 08/11/2022 16:33  No results found for this or any previous visit.  No results found for this or any previous visit.  No results found for this or any previous visit.  Results for orders placed during the hospital encounter of 03/29/22  Ultrasound renal complete  Narrative CLINICAL DATA:  Nephrolithiasis  EXAM: RENAL / URINARY TRACT ULTRASOUND COMPLETE  COMPARISON:  Ultrasound August 31, 2021  FINDINGS: Right Kidney:  Renal measurements: 11.0 x 4.8 x 5.7 cm = volume: 158 mL. Echogenicity within normal limits. No mass or hydronephrosis visualized.  Left Kidney:  Renal measurements: 11.4 x 6.0 x 5.3 cm = volume: 188 mL. Contains a 10 mm simple cyst of no clinical significance. No follow-up imaging recommended for the cyst.  Bladder:  Appears normal for degree of bladder distention.  Other:  None.  IMPRESSION: No suspicious findings. No renal stones or obstruction. The kidneys and bladder are unremarkable.   Electronically Signed By: Dorise Bullion III M.D. On: 03/29/2022 15:25  No valid procedures specified. No results found for this or any previous  visit.  Results for orders placed during the hospital encounter of 10/13/21  CT RENAL STONE STUDY  Narrative CLINICAL DATA:  Right-sided flank pain and hematuria for 1 month. Nephrolithiasis.  EXAM: CT ABDOMEN AND PELVIS WITHOUT  CONTRAST  TECHNIQUE: Multidetector CT imaging of the abdomen and pelvis was performed following the standard protocol without IV contrast.  RADIATION DOSE REDUCTION: This exam was performed according to the departmental dose-optimization program which includes automated exposure control, adjustment of the mA and/or kV according to patient size and/or use of iterative reconstruction technique.  COMPARISON:  01/01/2020  FINDINGS: Lower chest: No acute findings.  Hepatobiliary: No mass visualized on this unenhanced exam. Gallbladder is unremarkable. No evidence of biliary ductal dilatation.  Pancreas: No mass or inflammatory process visualized on this unenhanced exam.  Spleen:  Within normal limits in size.  Adrenals/Urinary tract: Several tiny less than 5 mm renal calculi are seen bilaterally. No evidence of ureteral calculi or hydronephrosis. Unremarkable unopacified urinary bladder.  Stomach/Bowel: Postop changes again seen from previous subtotal colectomy and sleeve gastrectomy. No evidence of obstruction, inflammatory process, or abnormal fluid collections.  Vascular/Lymphatic: 14 x 11 mm mesenteric soft tissue nodule or lymph node is again seen in the right abdominal mesentery on image 64/2, stable since prior exam. No other pathologically enlarged lymph nodes identified. No evidence of abdominal aortic aneurysm.  Reproductive: Prior hysterectomy noted. Adnexal regions are unremarkable in appearance.  Other:  None.  Musculoskeletal: No suspicious bone lesions identified. Stable small sclerotic bone lesion in L4 vertebral body compared to previous CT in 2016, consistent with benign etiology.  IMPRESSION: Tiny bilateral renal calculi. No evidence of ureteral calculi, hydronephrosis, or other acute findings.  Stable small right abdominal mesenteric lymph node or soft tissue nodule. Recommend continued follow-up by CT in 6-12 months.   Electronically Signed By:  Marlaine Hind M.D. On: 10/13/2021 12:38   Assessment & Plan:    1. Kidney stones -We discussed the management of kidney stones. These options include observation, ureteroscopy, shockwave lithotripsy (ESWL) and percutaneous nephrolithotomy (PCNL). We discussed which options are relevant to the patient's stone(s). We discussed the natural history of kidney stones as well as the complications of untreated stones and the impact on quality of life without treatment as well as with each of the above listed treatments. We also discussed the efficacy of each treatment in its ability to clear the stone burden. With any of these management options I discussed the signs and symptoms of infection and the need for emergent treatment should these be experienced. For each option we discussed the ability of each procedure to clear the patient of their stone burden.   For observation I described the risks which include but are not limited to silent renal damage, life-threatening infection, need for emergent surgery, failure to pass stone and pain.   For ureteroscopy I described the risks which include bleeding, infection, damage to contiguous structures, positioning injury, ureteral stricture, ureteral avulsion, ureteral injury, need for prolonged ureteral stent, inability to perform ureteroscopy, need for an interval procedure, inability to clear stone burden, stent discomfort/pain, heart attack, stroke, pulmonary embolus and the inherent risks with general anesthesia.   For shockwave lithotripsy I described the risks which include arrhythmia, kidney contusion, kidney hemorrhage, need for transfusion, pain, inability to adequately break up stone, inability to pass stone fragments, Steinstrasse, infection associated with obstructing stones, need for alternate surgical procedure, need for repeat shockwave lithotripsy, MI, CVA, PE and the inherent risks with anesthesia/conscious sedation.  For PCNL I described the risks  including positioning injury, pneumothorax, hydrothorax, need for chest tube, inability to clear stone burden, renal laceration, arterial venous fistula or malformation, need for embolization of kidney, loss of kidney or renal function, need for repeat procedure, need for prolonged nephrostomy tube, ureteral avulsion, MI, CVA, PE and the inherent risks of general anesthesia.   - The patient would like to proceed with left ESWL - Urinalysis, Routine w reflex microscopic  2. Chronic cystitis with hematuria -urine for culture - Urinalysis, Routine w reflex microscopic   No follow-ups on file.  Nicolette Bang, MD  [facility] [place]

## 2022-09-07 NOTE — Patient Instructions (Signed)
Dear [name],   Thank you for choosing [facility] [place] to assist in your urologic care for your upcoming surgery. The following information below includes specific dates and details related to surgery:   The Surgical Procedure you are scheduled to have performed is Extracorporeal shock wave lithotripsy   Surgery Date: 09/13/2022   Physician performing the surgery: Dr. Maudry Mayhew   Do not eat or drink after midnight the day before your surgery.    You will need a driver the day of surgery and will not be able to operate heavy machinery for 24 hours after.   POST OP Please have KUB xray completed day before or day of your post op appointment. You will go by [facility] radiology department before coming to your scheduled post op.    Your surgery will be performed at  [facility] [address], [place] [address]   Enter at the Main Entrance and check in at the [place] desk.     Pre-Admit Testing Info   Pre- Admit appointments are interview with an anesthesiologist or a pre-operative anesthesia nurse. These appointments are typically completed as an in person visit but can take place over the telephone.  You will be contacted to confirm the date and time window.    If you have any questions or concerns, please don't hesitate to call the office at [phone]   Thank you,    Gibson Ramp, RN Clinical Surgery Coordinator [facility]

## 2022-09-09 ENCOUNTER — Encounter (HOSPITAL_COMMUNITY)
Admission: RE | Admit: 2022-09-09 | Discharge: 2022-09-09 | Disposition: A | Discharge status: Home / Self Care | Payer: Medicare HMO | Source: Ambulatory Visit | Attending: Urology | Admitting: Urology

## 2022-09-09 ENCOUNTER — Encounter (HOSPITAL_COMMUNITY): Payer: Self-pay

## 2022-09-09 ENCOUNTER — Other Ambulatory Visit: Payer: Self-pay

## 2022-09-09 DIAGNOSIS — K760 Fatty (change of) liver, not elsewhere classified: Secondary | ICD-10-CM | POA: Diagnosis not present

## 2022-09-09 DIAGNOSIS — Z8639 Personal history of other endocrine, nutritional and metabolic disease: Secondary | ICD-10-CM | POA: Diagnosis not present

## 2022-09-09 DIAGNOSIS — Z0000 Encounter for general adult medical examination without abnormal findings: Secondary | ICD-10-CM | POA: Diagnosis not present

## 2022-09-09 DIAGNOSIS — E7800 Pure hypercholesterolemia, unspecified: Secondary | ICD-10-CM | POA: Diagnosis not present

## 2022-09-09 DIAGNOSIS — Z9884 Bariatric surgery status: Secondary | ICD-10-CM | POA: Diagnosis not present

## 2022-09-09 DIAGNOSIS — G8929 Other chronic pain: Secondary | ICD-10-CM | POA: Diagnosis not present

## 2022-09-09 DIAGNOSIS — G35 Multiple sclerosis: Secondary | ICD-10-CM | POA: Diagnosis not present

## 2022-09-09 DIAGNOSIS — R7303 Prediabetes: Secondary | ICD-10-CM | POA: Diagnosis not present

## 2022-09-09 DIAGNOSIS — K5901 Slow transit constipation: Secondary | ICD-10-CM | POA: Diagnosis not present

## 2022-09-09 LAB — URINE CULTURE

## 2022-09-09 NOTE — Pre-Procedure Instructions (Signed)
Attempted pre-op phonecall. Left VM for her to call us back. 

## 2022-09-12 ENCOUNTER — Other Ambulatory Visit: Payer: Self-pay | Admitting: Urology

## 2022-09-12 DIAGNOSIS — N200 Calculus of kidney: Secondary | ICD-10-CM

## 2022-09-13 ENCOUNTER — Ambulatory Visit (HOSPITAL_COMMUNITY): Payer: Medicare HMO

## 2022-09-13 ENCOUNTER — Ambulatory Visit (HOSPITAL_COMMUNITY)
Admission: RE | Admit: 2022-09-13 | Discharge: 2022-09-13 | Disposition: A | Discharge status: Home / Self Care | Payer: Medicare HMO | Attending: Urology | Admitting: Urology

## 2022-09-13 ENCOUNTER — Encounter (HOSPITAL_COMMUNITY)
Admission: RE | Disposition: A | Discharge status: Home / Self Care | Payer: Self-pay | Source: Home / Self Care | Attending: Urology

## 2022-09-13 ENCOUNTER — Encounter (HOSPITAL_COMMUNITY): Payer: Self-pay | Admitting: Urology

## 2022-09-13 ENCOUNTER — Encounter: Payer: Self-pay | Admitting: Urology

## 2022-09-13 DIAGNOSIS — N2889 Other specified disorders of kidney and ureter: Secondary | ICD-10-CM | POA: Diagnosis not present

## 2022-09-13 DIAGNOSIS — N200 Calculus of kidney: Secondary | ICD-10-CM | POA: Diagnosis not present

## 2022-09-13 DIAGNOSIS — Z87442 Personal history of urinary calculi: Secondary | ICD-10-CM | POA: Diagnosis not present

## 2022-09-13 HISTORY — PX: EXTRACORPOREAL SHOCK WAVE LITHOTRIPSY: SHX1557

## 2022-09-13 SURGERY — LITHOTRIPSY, ESWL
Anesthesia: LOCAL | Laterality: Left

## 2022-09-13 MED ORDER — HYDROMORPHONE HCL 2 MG PO TABS: 2.0000 mg | ORAL_TABLET | ORAL | 0 refills | Status: DC | PRN

## 2022-09-13 MED ORDER — PROMETHAZINE HCL 25 MG PO TABS: 25.0000 mg | ORAL_TABLET | Freq: Four times a day (QID) | ORAL | 0 refills | Status: DC | PRN

## 2022-09-13 MED ORDER — TAMSULOSIN HCL 0.4 MG PO CAPS: 0.4000 mg | ORAL_CAPSULE | Freq: Every day | ORAL | 11 refills | Status: DC

## 2022-09-13 MED ORDER — DIPHENHYDRAMINE HCL 25 MG PO CAPS
25.0000 mg | ORAL_CAPSULE | ORAL | Status: AC
  Administered 2022-09-13: 25 mg via ORAL
  Filled 2022-09-13: qty 1

## 2022-09-13 MED ORDER — DIAZEPAM 5 MG PO TABS
10.0000 mg | ORAL_TABLET | Freq: Once | ORAL | Status: AC
  Administered 2022-09-13: 10 mg via ORAL
  Filled 2022-09-13: qty 2

## 2022-09-13 MED ORDER — OXYCODONE HCL 5 MG PO TABS: 5.0000 mg | ORAL_TABLET | ORAL | 0 refills | Status: DC | PRN

## 2022-09-13 MED ORDER — SODIUM CHLORIDE 0.9 % IV SOLN
INTRAVENOUS | Status: DC
  Administered 2022-09-13: 1000 mL via INTRAVENOUS

## 2022-09-13 NOTE — Interval H&P Note (Signed)
History and Physical Interval Note:  09/13/2022 8:52 AM  Denise Avila  has presented today for surgery, with the diagnosis of right renal calculus.  The various methods of treatment have been discussed with the patient and family. After consideration of risks, benefits and other options for treatment, the patient has consented to  Procedure(s): EXTRACORPOREAL SHOCK WAVE LITHOTRIPSY (ESWL) (Left) as a surgical intervention.  The patient's history has been reviewed, patient examined, no change in status, stable for surgery.  I have reviewed the patient's chart and labs.  Questions were answered to the patient's satisfaction.     Nicolette Bang

## 2022-09-14 ENCOUNTER — Encounter (HOSPITAL_COMMUNITY): Payer: Self-pay | Admitting: Urology

## 2022-09-29 ENCOUNTER — Ambulatory Visit (HOSPITAL_COMMUNITY): Payer: Medicare HMO

## 2022-10-06 DIAGNOSIS — G5732 Lesion of lateral popliteal nerve, left lower limb: Secondary | ICD-10-CM | POA: Diagnosis not present

## 2022-10-07 ENCOUNTER — Ambulatory Visit (HOSPITAL_COMMUNITY)
Admission: RE | Admit: 2022-10-07 | Discharge: 2022-10-07 | Disposition: A | Discharge status: Home / Self Care | Payer: Medicare HMO | Source: Ambulatory Visit | Attending: Urology | Admitting: Urology

## 2022-10-07 DIAGNOSIS — N200 Calculus of kidney: Secondary | ICD-10-CM | POA: Diagnosis not present

## 2022-10-07 DIAGNOSIS — F411 Generalized anxiety disorder: Secondary | ICD-10-CM | POA: Diagnosis not present

## 2022-10-07 DIAGNOSIS — F330 Major depressive disorder, recurrent, mild: Secondary | ICD-10-CM | POA: Diagnosis not present

## 2022-10-07 DIAGNOSIS — G2581 Restless legs syndrome: Secondary | ICD-10-CM | POA: Diagnosis not present

## 2022-10-07 DIAGNOSIS — F4310 Post-traumatic stress disorder, unspecified: Secondary | ICD-10-CM | POA: Diagnosis not present

## 2022-10-10 ENCOUNTER — Other Ambulatory Visit: Payer: Self-pay | Admitting: Urology

## 2022-10-11 DIAGNOSIS — M5441 Lumbago with sciatica, right side: Secondary | ICD-10-CM | POA: Diagnosis not present

## 2022-10-11 DIAGNOSIS — M5412 Radiculopathy, cervical region: Secondary | ICD-10-CM | POA: Diagnosis not present

## 2022-10-11 DIAGNOSIS — G894 Chronic pain syndrome: Secondary | ICD-10-CM | POA: Diagnosis not present

## 2022-10-11 DIAGNOSIS — M797 Fibromyalgia: Secondary | ICD-10-CM | POA: Diagnosis not present

## 2022-10-11 DIAGNOSIS — R0789 Other chest pain: Secondary | ICD-10-CM | POA: Diagnosis not present

## 2022-10-11 DIAGNOSIS — M79644 Pain in right finger(s): Secondary | ICD-10-CM | POA: Diagnosis not present

## 2022-10-11 DIAGNOSIS — M533 Sacrococcygeal disorders, not elsewhere classified: Secondary | ICD-10-CM | POA: Diagnosis not present

## 2022-10-12 ENCOUNTER — Ambulatory Visit: Payer: Medicare HMO | Admitting: Urology

## 2022-10-14 ENCOUNTER — Ambulatory Visit (HOSPITAL_COMMUNITY)
Admission: RE | Admit: 2022-10-14 | Discharge: 2022-10-14 | Disposition: A | Discharge status: Home / Self Care | Payer: Medicare HMO | Source: Ambulatory Visit | Attending: Urology | Admitting: Urology

## 2022-10-14 ENCOUNTER — Ambulatory Visit: Payer: Medicare HMO | Admitting: Urology

## 2022-10-14 VITALS — BP 132/72 | HR 112

## 2022-10-14 DIAGNOSIS — N200 Calculus of kidney: Secondary | ICD-10-CM | POA: Diagnosis not present

## 2022-10-14 DIAGNOSIS — N2889 Other specified disorders of kidney and ureter: Secondary | ICD-10-CM | POA: Diagnosis not present

## 2022-10-14 LAB — MICROSCOPIC EXAMINATION: Bacteria, UA: NONE SEEN

## 2022-10-14 LAB — URINALYSIS, ROUTINE W REFLEX MICROSCOPIC
Bilirubin, UA: NEGATIVE
Glucose, UA: NEGATIVE
Ketones, UA: NEGATIVE
Leukocytes,UA: NEGATIVE
Nitrite, UA: NEGATIVE
RBC, UA: NEGATIVE
Specific Gravity, UA: 1.03 (ref 1.005–1.030)
Urobilinogen, Ur: 0.2 mg/dL (ref 0.2–1.0)
pH, UA: 5 (ref 5.0–7.5)

## 2022-10-14 MED ORDER — HYDROMORPHONE HCL 2 MG PO TABS: 2.0000 mg | ORAL_TABLET | ORAL | 0 refills | Status: DC | PRN

## 2022-10-14 NOTE — Progress Notes (Unsigned)
10/14/2022 10:26 AM   Denise Avila [birth year] [id number]  Referring provider: Lennie Odor, [place] [address] [employer] [address] [place],  [place] [address]  Followup nephrolithiasis  HPI: Ms Beel is a 47yo here for followup for nephrolithiasi. She has not seen any fragments since her ESWL. Renal US 1/5 shows layering lower pole calculus but no stone is seen in [facility]. She has intermittent left flank pain    PMH: Past Medical History:  Diagnosis Date   Abnormal Pap smear of cervix 12/2014   Acid reflux    Adrenal insufficiency (HCC)    Anxiety    Arthritis    Arthritis    Depression    Depression    Endometriosis    Fibroids    age 31   Fibromyalgia    High cholesterol    History of kidney stones    Liver disease    per pt report, will have biopsy Nov 08, 2017   Migraines    Pleurisy 02/2015   while hospitalized    Seizure ([place])    on meds and no seizures in 2-3 years. Unknown etiology   Skin cancer (melanoma) ([place])    Syncope and collapse    Uterine cancer [facility])     Surgical History: Past Surgical History:  Procedure Laterality Date   ABDOMINAL HYSTERECTOMY     ABLATION     uterine   COLECTOMY     CYSTOSCOPY W/ URETERAL STENT PLACEMENT Left 02/02/2015   Procedure: CYSTOSCOPY WITH RETROGRADE PYELOGRAM/URETERAL STENT PLACEMENT;  Surgeon: Festus Aloe, MD;  Location: [facility];  Service: Urology;  Laterality: Left;   CYSTOSCOPY WITH RETROGRADE PYELOGRAM, URETEROSCOPY AND STENT PLACEMENT Bilateral 01/20/2022   Procedure: CYSTOSCOPY WITH RETROGRADE PYELOGRAM, URETEROSCOPY AND STENT PLACEMENT;  Surgeon: Cleon Gustin, MD;  Location: [facility];  Service: Urology;  Laterality: Bilateral;   CYSTOSCOPY WITH URETEROSCOPY AND STENT PLACEMENT Left 02/13/2015   Procedure: LEFT URETEROSCOPY WITH HOLMIUM LASER AND STENT PLACEMENT;  Surgeon: Festus Aloe, MD;  Location: [facility];  Service: Urology;  Laterality: Left;   DILATION AND CURETTAGE OF UTERUS      ESOPHAGOGASTRODUODENOSCOPY ENDOSCOPY  10/11/2017   @ [facility]; BIOPSY OF LOWER INTESTINE   EXTRACORPOREAL SHOCK WAVE LITHOTRIPSY Left 09/13/2022   Procedure: EXTRACORPOREAL SHOCK WAVE LITHOTRIPSY (ESWL);  Surgeon: Cleon Gustin, MD;  Location: [facility];  Service: Urology;  Laterality: Left;   HOLMIUM LASER APPLICATION N/A 40/05/6760   Procedure: HOLMIUM LASER APPLICATION;  Surgeon: Festus Aloe, MD;  Location: [facility];  Service: Urology;  Laterality: N/A;   IR NEPHROSTOMY PLACEMENT RIGHT  07/05/2021   KNEE SURGERY Bilateral 2019   realignment of knees   LAPAROSCOPIC GASTRIC SLEEVE RESECTION     LAPAROSCOPY     x 4   MELANOMA EXCISION  2000   melanoma removed from face, basal cell removed from nose   NEPHROLITHOTOMY Right 07/08/2021   Procedure: NEPHROLITHOTOMY PERCUTANEOUS- with stent placement;  Surgeon: Cleon Gustin, MD;  Location: [facility];  Service: Urology;  Laterality: Right;   REPAIR VAGINAL CUFF N/A 08/21/2015   Procedure: REPAIR VAGINAL CUFF, EXAM UNDER ANESTHESIA;  Surgeon: Everitt Amber, MD;  Location[facility];  Service: Gynecology;  Laterality: N/A;   ROBOTIC ASSISTED TOTAL HYSTERECTOMY WITH BILATERAL SALPINGO OOPHERECTOMY Bilateral 05/21/2015   Procedure: XI ROBOTIC ASSISTED TOTAL HYSTERECTOMY WITH BILATERAL SALPINGO OOPHORECTOMY;  Surgeon: Everitt Amber, MD;  Location: [facility];  Service: Gynecology;  Laterality: Bilateral;   STONE EXTRACTION WITH BASKET Bilateral 01/20/2022   Procedure: STONE  EXTRACTION WITH BASKET;  Surgeon: Cleon Gustin, MD;  Location: [facility];  Service: Urology;  Laterality: Bilateral;    Home Medications:  Allergies as of 10/14/2022       Reactions   Penicillins Swelling, Rash   Rash all over [place] and lip swelling. Has patient had a PCN reaction causing immediate rash, facial/tongue/throat swelling, SOB or lightheadedness with hypotension: Yes Has patient had a PCN reaction causing severe rash involving mucus membranes or skin necrosis: Yes Has  patient had a PCN reaction that required hospitalization: No If all of the above answers are "NO", then may proceed with Cephalosporin use.   Nortriptyline Other (See Comments)   Severe chest pain    Nsaids Other (See Comments)   Advised not to take due to gastric sleeve    Amoxicillin Rash   Rash all over [place]   Ciprofloxacin Diarrhea, Nausea Only        Medication List        Accurate as of October 14, 2022 10:26 AM. If you have any questions, ask your nurse or doctor.          acetaminophen 500 MG tablet Commonly known as: TYLENOL Take 1,000 mg by mouth every 6 (six) hours as needed for moderate pain.   allopurinol 300 MG tablet Commonly known as: ZYLOPRIM TAKE 1 TABLET EVERY DAY   baclofen 10 MG tablet Commonly known as: LIORESAL Take 10 mg by mouth 3 (three) times daily.   celecoxib 200 MG capsule Commonly known as: CELEBREX Take 200 mg by mouth daily.   citalopram 10 MG tablet Commonly known as: CELEXA Take 10 mg by mouth daily.   clonazePAM 1 MG tablet Commonly known as: KLONOPIN Take 1 mg by mouth at bedtime.   diphenhydrAMINE 25 MG tablet Commonly known as: BENADRYL Take 25 mg by mouth every 6 (six) hours as needed (take with promethazine for migraines).   doxycycline 100 MG EC tablet Commonly known as: DORYX Take 1 tablet (100 mg total) by mouth 2 (two) times daily.   DULoxetine 60 MG capsule Commonly known as: CYMBALTA Take 60 mg by mouth at bedtime.   DULoxetine 30 MG capsule Commonly known as: CYMBALTA Take 30 mg by mouth in the morning.   eletriptan 40 MG tablet Commonly known as: RELPAX Take 40 mg by mouth daily as needed for migraine.   ezetimibe 10 MG tablet Commonly known as: ZETIA Take 10 mg by mouth daily.   fluconazole 100 MG tablet Commonly known as: DIFLUCAN Take 1 tablet (100 mg total) by mouth daily. X 7 days   gabapentin 800 MG tablet Commonly known as: NEURONTIN Take 2 tablets (1,600 mg total) by mouth 2 (two)  times daily.   GAS-X PO Take 1-2 tablets by mouth 3 (three) times daily as needed (gas).   HYDROmorphone 4 MG tablet Commonly known as: Dilaudid Take 1 tablet (4 mg total) by mouth every 4 (four) hours as needed for severe pain.   HYDROmorphone 2 MG tablet Commonly known as: DILAUDID TAKE 1 TABLET(2 MG) BY MOUTH EVERY 4 HOURS AS NEEDED FOR SEVERE PAIN   lamoTRIgine 200 MG tablet Commonly known as: LAMICTAL Take 200 mg by mouth every evening.   levocetirizine 5 MG tablet Commonly known as: XYZAL Take 5 mg by mouth every evening.   methenamine 1 g tablet Commonly known as: MANDELAMINE Take 1 tablet (1,000 mg total) by mouth 2 (two) times daily.   mirabegron ER 25 MG Tb24 tablet Commonly known as:  MYRBETRIQ Take 1 tablet (25 mg total) by mouth daily.   naratriptan 2.5 MG tablet Commonly known as: AMERGE Take 2.5 mg by mouth as needed for migraine. Take one (1) tablet at onset of headache; if returns or does not resolve, may repeat after 4 hours; do not exceed five (5) mg in 24 hours.   nitrofurantoin 50 MG capsule Commonly known as: MACRODANTIN Take 1 capsule (50 mg total) by mouth at bedtime.   omeprazole 40 MG capsule Commonly known as: PRILOSEC Take 40 mg by mouth 2 (two) times daily.   OVER THE COUNTER MEDICATION Take 1 tablet by mouth daily. Bariatric Pal otc vitamin   oxyCODONE 5 MG immediate release tablet Commonly known as: Roxicodone Take 1 tablet (5 mg total) by mouth every 4 (four) hours as needed for severe pain.   oxyCODONE-acetaminophen 5-325 MG tablet Commonly known as: Percocet Take 1 tablet by mouth every 4 (four) hours as needed.   phenazopyridine 200 MG tablet Commonly known as: Pyridium Take 1 tablet (200 mg total) by mouth 3 (three) times daily as needed for pain.   promethazine 25 MG tablet Commonly known as: PHENERGAN Take 1 tablet (25 mg total) by mouth 4 (four) times daily as needed for nausea or vomiting.   QUEtiapine 50 MG  tablet Commonly known as: SEROQUEL Take 50 mg by mouth at bedtime.   rizatriptan 10 MG tablet Commonly known as: MAXALT Take 10 mg by mouth as needed for migraine.   rosuvastatin 5 MG tablet Commonly known as: CRESTOR Take 5 mg by mouth daily.   sodium bicarbonate 650 MG tablet Take 1 tablet (650 mg total) by mouth 2 (two) times daily.   sucralfate 1 g tablet Commonly known as: Carafate Take 1 tablet (1 g total) by mouth 4 (four) times daily.   sulfamethoxazole-trimethoprim 800-160 MG tablet Commonly known as: BACTRIM DS Take 1 tablet by mouth every 12 (twelve) hours.   SYSTANE OP Place 1 drop into both eyes in the morning, at noon, and at bedtime.   tamsulosin 0.4 MG Caps capsule Commonly known as: FLOMAX Take 1 capsule (0.4 mg total) by mouth daily after supper.   tiZANidine 2 MG tablet Commonly known as: ZANAFLEX Take 2 mg by mouth 3 (three) times daily.   Ubrelvy 100 MG Tabs Generic drug: Ubrogepant Take 100 mg by mouth daily as needed (migraines).        Allergies:  Allergies  Allergen Reactions   Penicillins Swelling and Rash    Rash all over [place] and lip swelling. Has patient had a PCN reaction causing immediate rash, facial/tongue/throat swelling, SOB or lightheadedness with hypotension: Yes Has patient had a PCN reaction causing severe rash involving mucus membranes or skin necrosis: Yes Has patient had a PCN reaction that required hospitalization: No If all of the above answers are "NO", then may proceed with Cephalosporin use.   Nortriptyline Other (See Comments)    Severe chest pain     Nsaids Other (See Comments)    Advised not to take due to gastric sleeve    Amoxicillin Rash    Rash all over [place]    Ciprofloxacin Diarrhea and Nausea Only    Family History: Family History  Problem Relation Age of Onset   Hypertension Mother    Melanoma Mother    Breast cancer Mother 12   Cancer - Other Father        liver   Diabetes Mellitus II  Maternal Grandmother    Heart disease  Maternal Grandmother    Breast cancer Maternal Grandmother    Cancer - Lung Maternal Grandfather    Osteoporosis Paternal Grandmother    Melanoma Paternal Grandfather     Social History:  reports that she has never smoked. She has never used smokeless tobacco. She reports that she does not drink alcohol and does not use drugs.  ROS: All other review of systems were reviewed and are negative except what is noted above in HPI  Physical Exam: BP 132/72   Pulse (!) 112   LMP 05/04/2015 (Approximate)   Constitutional:  Alert and oriented, No acute distress. HEENT: [place] AT, moist mucus membranes.  Trachea midline, no masses. Cardiovascular: No clubbing, cyanosis, or edema. Respiratory: Normal respiratory effort, no increased work of breathing. GI: Abdomen is soft, nontender, nondistended, no abdominal masses GU: No CVA tenderness.  Lymph: No cervical or inguinal lymphadenopathy. Skin: No rashes, bruises or suspicious lesions. Neurologic: Grossly intact, no focal deficits, moving all 4 extremities. Psychiatric: Normal mood and affect.  Laboratory Data: Lab Results  Component Value Date   WBC 11.0 (H) 07/09/2021   HGB 11.6 (L) 07/09/2021   HCT 35.1 (L) 07/09/2021   MCV 94.1 07/09/2021   PLT 167 07/09/2021    Lab Results  Component Value Date   CREATININE 0.67 01/17/2022    No results found for: "PSA"  No results found for: "TESTOSTERONE"  Lab Results  Component Value Date   HGBA1C 5.3 10/13/2021    Urinalysis    Component Value Date/Time   COLORURINE YELLOW (A) 06/21/2017 1735   APPEARANCEUR Hazy (A) 09/07/2022 1136   LABSPEC 1.019 06/21/2017 1735   PHURINE 5.0 06/21/2017 1735   GLUCOSEU Negative 09/07/2022 1136   HGBUR NEGATIVE 06/21/2017 1735   BILIRUBINUR Negative 09/07/2022 1136   KETONESUR NEGATIVE 06/21/2017 1735   PROTEINUR Negative 09/07/2022 1136   PROTEINUR NEGATIVE 06/21/2017 1735   UROBILINOGEN 0.2 01/27/2020  1124   UROBILINOGEN 1.0 05/18/2015 1359   NITRITE Negative 09/07/2022 1136   NITRITE NEGATIVE 06/21/2017 1735   LEUKOCYTESUR 1+ (A) 09/07/2022 1136    Lab Results  Component Value Date   LABMICR See below: 09/07/2022   WBCUA 6-10 (A) 09/07/2022   LABEPIT 0-10 09/07/2022   MUCUS Present 12/03/2021   BACTERIA Few 09/07/2022    Pertinent Imaging: KUB today: Images reviewed and discussed with the patient  Results for orders placed during the hospital encounter of 09/13/22  DG Abd 1 View  Narrative CLINICAL DATA:  Left-sided renal stone.  Pre lithotripsy  EXAM: ABDOMEN - 1 VIEW  COMPARISON:  09/07/2022  FINDINGS: Single supine view of the abdomen and pelvis. 4 mm calcification projecting over the lower pole left kidney. No other renal calcifications identified. Presumed phleboliths in the pelvis without evidence of ureteric stone.  IMPRESSION: 4 mm calcification projecting over the lower pole left kidney, likely collecting system calculus.   Electronically Signed By: Abigail Miyamoto M.D. On: 09/13/2022 08:22  No results found for this or any previous visit.  No results found for this or any previous visit.  No results found for this or any previous visit.  Results for orders placed during the hospital encounter of 10/07/22  Ultrasound renal complete  Narrative CLINICAL DATA:  Nephrolithiasis.  EXAM: RENAL / URINARY TRACT ULTRASOUND COMPLETE  COMPARISON:  Abdominal radiograph September 13, 2022; renal stone CT 10/13/2021  FINDINGS: Right Kidney:  Renal measurements: 10.3 x 4.7 x 5.7 cm = volume: 145 mL. Echogenicity within normal limits. No mass or hydronephrosis  visualized.  Left Kidney:  Renal measurements: 11.4 x 5.6 x 5.0 cm = volume: 166.7 mL. Echogenicity within normal limits. No mass or hydronephrosis visualized. There is an 8 mm stone within the inferior pole of the left kidney.  Bladder:  Appears normal for degree of bladder  distention.  Other:  None.  IMPRESSION: 1. No hydronephrosis. 2. Left nephrolithiasis.   Electronically Signed By: Lovey Newcomer M.D. On: 10/07/2022 09:59  No valid procedures specified. No results found for this or any previous visit.  Results for orders placed during the hospital encounter of 10/13/21  CT RENAL STONE STUDY  Narrative CLINICAL DATA:  Right-sided flank pain and hematuria for 1 month. Nephrolithiasis.  EXAM: CT ABDOMEN AND PELVIS WITHOUT CONTRAST  TECHNIQUE: Multidetector CT imaging of the abdomen and pelvis was performed following the standard protocol without IV contrast.  RADIATION DOSE REDUCTION: This exam was performed according to the departmental dose-optimization program which includes automated exposure control, adjustment of the mA and/or kV according to patient size and/or use of iterative reconstruction technique.  COMPARISON:  01/01/2020  FINDINGS: Lower chest: No acute findings.  Hepatobiliary: No mass visualized on this unenhanced exam. Gallbladder is unremarkable. No evidence of biliary ductal dilatation.  Pancreas: No mass or inflammatory process visualized on this unenhanced exam.  Spleen:  Within normal limits in size.  Adrenals/Urinary tract: Several tiny less than 5 mm renal calculi are seen bilaterally. No evidence of ureteral calculi or hydronephrosis. Unremarkable unopacified urinary bladder.  Stomach/Bowel: Postop changes again seen from previous subtotal colectomy and sleeve gastrectomy. No evidence of obstruction, inflammatory process, or abnormal fluid collections.  Vascular/Lymphatic: 14 x 11 mm mesenteric soft tissue nodule or lymph node is again seen in the right abdominal mesentery on image 64/2, stable since prior exam. No other pathologically enlarged lymph nodes identified. No evidence of abdominal aortic aneurysm.  Reproductive: Prior hysterectomy noted. Adnexal regions are unremarkable in  appearance.  Other:  None.  Musculoskeletal: No suspicious bone lesions identified. Stable small sclerotic bone lesion in L4 vertebral body compared to previous CT in 2016, consistent with benign etiology.  IMPRESSION: Tiny bilateral renal calculi. No evidence of ureteral calculi, hydronephrosis, or other acute findings.  Stable small right abdominal mesenteric lymph node or soft tissue nodule. Recommend continued follow-up by CT in 6-12 months.   Electronically Signed By: Marlaine Hind M.D. On: 10/13/2021 12:38   Assessment & Plan:    1. Kidney stones -We discussed the management of kidney stones. These options include observation, ureteroscopy, shockwave lithotripsy (ESWL) and percutaneous nephrolithotomy (PCNL). We discussed which options are relevant to the patient's stone(s). We discussed the natural history of kidney stones as well as the complications of untreated stones and the impact on quality of life without treatment as well as with each of the above listed treatments. We also discussed the efficacy of each treatment in its ability to clear the stone burden. With any of these management options I discussed the signs and symptoms of infection and the need for emergent treatment should these be experienced. For each option we discussed the ability of each procedure to clear the patient of their stone burden.   For observation I described the risks which include but are not limited to silent renal damage, life-threatening infection, need for emergent surgery, failure to pass stone and pain.   For ureteroscopy I described the risks which include bleeding, infection, damage to contiguous structures, positioning injury, ureteral stricture, ureteral avulsion, ureteral injury, need for prolonged ureteral  stent, inability to perform ureteroscopy, need for an interval procedure, inability to clear stone burden, stent discomfort/pain, heart attack, stroke, pulmonary embolus and the  inherent risks with general anesthesia.   For shockwave lithotripsy I described the risks which include arrhythmia, kidney contusion, kidney hemorrhage, need for transfusion, pain, inability to adequately break up stone, inability to pass stone fragments, Steinstrasse, infection associated with obstructing stones, need for alternate surgical procedure, need for repeat shockwave lithotripsy, MI, CVA, PE and the inherent risks with anesthesia/conscious sedation.   For PCNL I described the risks including positioning injury, pneumothorax, hydrothorax, need for chest tube, inability to clear stone burden, renal laceration, arterial venous fistula or malformation, need for embolization of kidney, loss of kidney or renal function, need for repeat procedure, need for prolonged nephrostomy tube, ureteral avulsion, MI, CVA, PE and the inherent risks of general anesthesia.   - The patient would like to proceed with left ureteroscopic stone extraction. - Urinalysis, Routine w reflex microscopic   No follow-ups on file.  Nicolette Bang, MD  [facility]

## 2022-10-14 NOTE — H&P (View-Only) (Signed)
10/14/2022 10:26 AM   [name] [birth year] [id number]  Referring provider: Lennie Odor, [address] [employer] [address] [place],  [place] [address]  Followup nephrolithiasis  HPI: Ms Barrack is a 47yo here for followup for nephrolithiasi. She has not seen any fragments since her ESWL. Renal US 1/5 shows layering lower pole calculus but no stone is seen in [facility]. She has intermittent left flank pain    PMH: Past Medical History:  Diagnosis Date   Abnormal Pap smear of cervix 12/2014   Acid reflux    Adrenal insufficiency (HCC)    Anxiety    Arthritis    Arthritis    Depression    Depression    Endometriosis    Fibroids    age 34   Fibromyalgia    High cholesterol    History of kidney stones    Liver disease    per pt report, will have biopsy Nov 08, 2017   Migraines    Pleurisy 02/2015   while hospitalized    Seizure ([facility])    on meds and no seizures in 2-3 years. Unknown etiology   Skin cancer (melanoma) ([facility])    Syncope and collapse    Uterine cancer [facility])     Surgical History: Past Surgical History:  Procedure Laterality Date   ABDOMINAL HYSTERECTOMY     ABLATION     uterine   COLECTOMY     CYSTOSCOPY W/ URETERAL STENT PLACEMENT Left 02/02/2015   Procedure: CYSTOSCOPY WITH RETROGRADE PYELOGRAM/URETERAL STENT PLACEMENT;  Surgeon: Festus Aloe, MD;  Location: [facility];  Service: Urology;  Laterality: Left;   CYSTOSCOPY WITH RETROGRADE PYELOGRAM, URETEROSCOPY AND STENT PLACEMENT Bilateral 01/20/2022   Procedure: CYSTOSCOPY WITH RETROGRADE PYELOGRAM, URETEROSCOPY AND STENT PLACEMENT;  Surgeon: Cleon Gustin, MD;  Location: [facility];  Service: Urology;  Laterality: Bilateral;   CYSTOSCOPY WITH URETEROSCOPY AND STENT PLACEMENT Left 02/13/2015   Procedure: LEFT URETEROSCOPY WITH HOLMIUM LASER AND STENT PLACEMENT;  Surgeon: Festus Aloe, MD;  Location: [facility];  Service: Urology;  Laterality: Left;   DILATION AND CURETTAGE OF UTERUS      ESOPHAGOGASTRODUODENOSCOPY ENDOSCOPY  10/11/2017   @ [facility]; BIOPSY OF LOWER INTESTINE   EXTRACORPOREAL SHOCK WAVE LITHOTRIPSY Left 09/13/2022   Procedure: EXTRACORPOREAL SHOCK WAVE LITHOTRIPSY (ESWL);  Surgeon: Cleon Gustin, MD;  Location: [facility];  Service: Urology;  Laterality: Left;   HOLMIUM LASER APPLICATION N/A [id number]/[id number]   Procedure: HOLMIUM LASER APPLICATION;  Surgeon: Festus Aloe, MD;  Location: [facility];  Service: Urology;  Laterality: N/A;   IR NEPHROSTOMY PLACEMENT RIGHT  07/05/2021   KNEE SURGERY Bilateral 2019   realignment of knees   LAPAROSCOPIC GASTRIC SLEEVE RESECTION     LAPAROSCOPY     x 4   MELANOMA EXCISION  2000   melanoma removed from face, basal cell removed from nose   NEPHROLITHOTOMY Right 07/08/2021   Procedure: NEPHROLITHOTOMY PERCUTANEOUS- with stent placement;  Surgeon: Cleon Gustin, MD;  Location: [facility];  Service: Urology;  Laterality: Right;   REPAIR VAGINAL CUFF N/A 08/21/2015   Procedure: REPAIR VAGINAL CUFF, EXAM UNDER ANESTHESIA;  Surgeon: Everitt Amber, MD;  Location: [facility];  Service: Gynecology;  Laterality: N/A;   ROBOTIC ASSISTED TOTAL HYSTERECTOMY WITH BILATERAL SALPINGO OOPHERECTOMY Bilateral 05/21/2015   Procedure: XI ROBOTIC ASSISTED TOTAL HYSTERECTOMY WITH BILATERAL SALPINGO OOPHORECTOMY;  Surgeon: Everitt Amber, MD;  Location: [facility];  Service: Gynecology;  Laterality: Bilateral;   STONE EXTRACTION WITH BASKET Bilateral 01/20/2022   Procedure: STONE  EXTRACTION WITH BASKET;  Surgeon: Cleon Gustin, MD;  Location: [facility];  Service: Urology;  Laterality: Bilateral;    Home Medications:  Allergies as of 10/14/2022       Reactions   Penicillins Swelling, Rash   Rash all over [place] and lip swelling. Has patient had a PCN reaction causing immediate rash, facial/tongue/throat swelling, SOB or lightheadedness with hypotension: Yes Has patient had a PCN reaction causing severe rash involving mucus membranes or skin necrosis: Yes Has  patient had a PCN reaction that required hospitalization: No If all of the above answers are "NO", then may proceed with Cephalosporin use.   Nortriptyline Other (See Comments)   Severe chest pain    Nsaids Other (See Comments)   Advised not to take due to gastric sleeve    Amoxicillin Rash   Rash all over [place]   Ciprofloxacin Diarrhea, Nausea Only        Medication List        Accurate as of October 14, 2022 10:26 AM. If you have any questions, ask your nurse or doctor.          acetaminophen 500 MG tablet Commonly known as: TYLENOL Take 1,000 mg by mouth every 6 (six) hours as needed for moderate pain.   allopurinol 300 MG tablet Commonly known as: ZYLOPRIM TAKE 1 TABLET EVERY DAY   baclofen 10 MG tablet Commonly known as: LIORESAL Take 10 mg by mouth 3 (three) times daily.   celecoxib 200 MG capsule Commonly known as: CELEBREX Take 200 mg by mouth daily.   citalopram 10 MG tablet Commonly known as: CELEXA Take 10 mg by mouth daily.   clonazePAM 1 MG tablet Commonly known as: KLONOPIN Take 1 mg by mouth at bedtime.   diphenhydrAMINE 25 MG tablet Commonly known as: BENADRYL Take 25 mg by mouth every 6 (six) hours as needed (take with promethazine for migraines).   doxycycline 100 MG EC tablet Commonly known as: DORYX Take 1 tablet (100 mg total) by mouth 2 (two) times daily.   DULoxetine 60 MG capsule Commonly known as: CYMBALTA Take 60 mg by mouth at bedtime.   DULoxetine 30 MG capsule Commonly known as: CYMBALTA Take 30 mg by mouth in the morning.   eletriptan 40 MG tablet Commonly known as: RELPAX Take 40 mg by mouth daily as needed for migraine.   ezetimibe 10 MG tablet Commonly known as: ZETIA Take 10 mg by mouth daily.   fluconazole 100 MG tablet Commonly known as: DIFLUCAN Take 1 tablet (100 mg total) by mouth daily. X 7 days   gabapentin 800 MG tablet Commonly known as: NEURONTIN Take 2 tablets (1,600 mg total) by mouth 2 (two)  times daily.   GAS-X PO Take 1-2 tablets by mouth 3 (three) times daily as needed (gas).   HYDROmorphone 4 MG tablet Commonly known as: Dilaudid Take 1 tablet (4 mg total) by mouth every 4 (four) hours as needed for severe pain.   HYDROmorphone 2 MG tablet Commonly known as: DILAUDID TAKE 1 TABLET(2 MG) BY MOUTH EVERY 4 HOURS AS NEEDED FOR SEVERE PAIN   lamoTRIgine 200 MG tablet Commonly known as: LAMICTAL Take 200 mg by mouth every evening.   levocetirizine 5 MG tablet Commonly known as: XYZAL Take 5 mg by mouth every evening.   methenamine 1 g tablet Commonly known as: MANDELAMINE Take 1 tablet (1,000 mg total) by mouth 2 (two) times daily.   mirabegron ER 25 MG Tb24 tablet Commonly known as:  MYRBETRIQ Take 1 tablet (25 mg total) by mouth daily.   naratriptan 2.5 MG tablet Commonly known as: AMERGE Take 2.5 mg by mouth as needed for migraine. Take one (1) tablet at onset of headache; if returns or does not resolve, may repeat after 4 hours; do not exceed five (5) mg in 24 hours.   nitrofurantoin 50 MG capsule Commonly known as: MACRODANTIN Take 1 capsule (50 mg total) by mouth at bedtime.   omeprazole 40 MG capsule Commonly known as: PRILOSEC Take 40 mg by mouth 2 (two) times daily.   OVER THE COUNTER MEDICATION Take 1 tablet by mouth daily. Bariatric Pal otc vitamin   oxyCODONE 5 MG immediate release tablet Commonly known as: Roxicodone Take 1 tablet (5 mg total) by mouth every 4 (four) hours as needed for severe pain.   oxyCODONE-acetaminophen 5-325 MG tablet Commonly known as: Percocet Take 1 tablet by mouth every 4 (four) hours as needed.   phenazopyridine 200 MG tablet Commonly known as: Pyridium Take 1 tablet (200 mg total) by mouth 3 (three) times daily as needed for pain.   promethazine 25 MG tablet Commonly known as: PHENERGAN Take 1 tablet (25 mg total) by mouth 4 (four) times daily as needed for nausea or vomiting.   QUEtiapine 50 MG  tablet Commonly known as: SEROQUEL Take 50 mg by mouth at bedtime.   rizatriptan 10 MG tablet Commonly known as: MAXALT Take 10 mg by mouth as needed for migraine.   rosuvastatin 5 MG tablet Commonly known as: CRESTOR Take 5 mg by mouth daily.   sodium bicarbonate 650 MG tablet Take 1 tablet (650 mg total) by mouth 2 (two) times daily.   sucralfate 1 g tablet Commonly known as: Carafate Take 1 tablet (1 g total) by mouth 4 (four) times daily.   sulfamethoxazole-trimethoprim 800-160 MG tablet Commonly known as: BACTRIM DS Take 1 tablet by mouth every 12 (twelve) hours.   SYSTANE OP Place 1 drop into both eyes in the morning, at noon, and at bedtime.   tamsulosin 0.4 MG Caps capsule Commonly known as: FLOMAX Take 1 capsule (0.4 mg total) by mouth daily after supper.   tiZANidine 2 MG tablet Commonly known as: ZANAFLEX Take 2 mg by mouth 3 (three) times daily.   Ubrelvy 100 MG Tabs Generic drug: Ubrogepant Take 100 mg by mouth daily as needed (migraines).        Allergies:  Allergies  Allergen Reactions   Penicillins Swelling and Rash    Rash all over [place] and lip swelling. Has patient had a PCN reaction causing immediate rash, facial/tongue/throat swelling, SOB or lightheadedness with hypotension: Yes Has patient had a PCN reaction causing severe rash involving mucus membranes or skin necrosis: Yes Has patient had a PCN reaction that required hospitalization: No If all of the above answers are "NO", then may proceed with Cephalosporin use.   Nortriptyline Other (See Comments)    Severe chest pain     Nsaids Other (See Comments)    Advised not to take due to gastric sleeve    Amoxicillin Rash    Rash all over [place]    Ciprofloxacin Diarrhea and Nausea Only    Family History: Family History  Problem Relation Age of Onset   Hypertension Mother    Melanoma Mother    Breast cancer Mother 3   Cancer - Other Father        liver   Diabetes Mellitus II  Maternal Grandmother    Heart disease  Maternal Grandmother    Breast cancer Maternal Grandmother    Cancer - Lung Maternal Grandfather    Osteoporosis Paternal Grandmother    Melanoma Paternal Grandfather     Social History:  reports that she has never smoked. She has never used smokeless tobacco. She reports that she does not drink alcohol and does not use drugs.  ROS: All other review of systems were reviewed and are negative except what is noted above in HPI  Physical Exam: BP 132/72   Pulse (!) 112   LMP 05/04/2015 (Approximate)   Constitutional:  Alert and oriented, No acute distress. HEENT: [place] AT, moist mucus membranes.  Trachea midline, no masses. Cardiovascular: No clubbing, cyanosis, or edema. Respiratory: Normal respiratory effort, no increased work of breathing. GI: Abdomen is soft, nontender, nondistended, no abdominal masses GU: No CVA tenderness.  Lymph: No cervical or inguinal lymphadenopathy. Skin: No rashes, bruises or suspicious lesions. Neurologic: Grossly intact, no focal deficits, moving all 4 extremities. Psychiatric: Normal mood and affect.  Laboratory Data: Lab Results  Component Value Date   WBC 11.0 (H) 07/09/2021   HGB 11.6 (L) 07/09/2021   HCT 35.1 (L) 07/09/2021   MCV 94.1 07/09/2021   PLT 167 07/09/2021    Lab Results  Component Value Date   CREATININE 0.67 01/17/2022    No results found for: "PSA"  No results found for: "TESTOSTERONE"  Lab Results  Component Value Date   HGBA1C 5.3 10/13/2021    Urinalysis    Component Value Date/Time   COLORURINE YELLOW (A) 06/21/2017 1735   APPEARANCEUR Hazy (A) 09/07/2022 1136   LABSPEC 1.019 06/21/2017 1735   PHURINE 5.0 06/21/2017 1735   GLUCOSEU Negative 09/07/2022 1136   HGBUR NEGATIVE 06/21/2017 1735   BILIRUBINUR Negative 09/07/2022 1136   KETONESUR NEGATIVE 06/21/2017 1735   PROTEINUR Negative 09/07/2022 1136   PROTEINUR NEGATIVE 06/21/2017 1735   UROBILINOGEN 0.2 01/27/2020  1124   UROBILINOGEN 1.0 05/18/2015 1359   NITRITE Negative 09/07/2022 1136   NITRITE NEGATIVE 06/21/2017 1735   LEUKOCYTESUR 1+ (A) 09/07/2022 1136    Lab Results  Component Value Date   LABMICR See below: 09/07/2022   WBCUA 6-10 (A) 09/07/2022   LABEPIT 0-10 09/07/2022   MUCUS Present 12/03/2021   BACTERIA Few 09/07/2022    Pertinent Imaging: [facility] today: Images reviewed and discussed with the patient  Results for orders placed during the hospital encounter of 09/13/22  DG Abd 1 View  Narrative CLINICAL DATA:  Left-sided renal stone.  Pre lithotripsy  EXAM: ABDOMEN - 1 VIEW  COMPARISON:  09/07/2022  FINDINGS: Single supine view of the abdomen and pelvis. 4 mm calcification projecting over the lower pole left kidney. No other renal calcifications identified. Presumed phleboliths in the pelvis without evidence of ureteric stone.  IMPRESSION: 4 mm calcification projecting over the lower pole left kidney, likely collecting system calculus.   Electronically Signed By: Abigail Miyamoto M.D. On: 09/13/2022 08:22  No results found for this or any previous visit.  No results found for this or any previous visit.  No results found for this or any previous visit.  Results for orders placed during the hospital encounter of 10/07/22  Ultrasound renal complete  Narrative CLINICAL DATA:  Nephrolithiasis.  EXAM: RENAL / URINARY TRACT ULTRASOUND COMPLETE  COMPARISON:  Abdominal radiograph September 13, 2022; renal stone CT 10/13/2021  FINDINGS: Right Kidney:  Renal measurements: 10.3 x 4.7 x 5.7 cm = volume: 145 mL. Echogenicity within normal limits. No mass or hydronephrosis  visualized.  Left Kidney:  Renal measurements: 11.4 x 5.6 x 5.0 cm = volume: 166.7 mL. Echogenicity within normal limits. No mass or hydronephrosis visualized. There is an 8 mm stone within the inferior pole of the left kidney.  Bladder:  Appears normal for degree of bladder  distention.  Other:  None.  IMPRESSION: 1. No hydronephrosis. 2. Left nephrolithiasis.   Electronically Signed By: Lovey Newcomer M.D. On: 10/07/2022 09:59  No valid procedures specified. No results found for this or any previous visit.  Results for orders placed during the hospital encounter of 10/13/21  CT RENAL STONE STUDY  Narrative CLINICAL DATA:  Right-sided flank pain and hematuria for 1 month. Nephrolithiasis.  EXAM: CT ABDOMEN AND PELVIS WITHOUT CONTRAST  TECHNIQUE: Multidetector CT imaging of the abdomen and pelvis was performed following the standard protocol without IV contrast.  RADIATION DOSE REDUCTION: This exam was performed according to the departmental dose-optimization program which includes automated exposure control, adjustment of the mA and/or kV according to patient size and/or use of iterative reconstruction technique.  COMPARISON:  01/01/2020  FINDINGS: Lower chest: No acute findings.  Hepatobiliary: No mass visualized on this unenhanced exam. Gallbladder is unremarkable. No evidence of biliary ductal dilatation.  Pancreas: No mass or inflammatory process visualized on this unenhanced exam.  Spleen:  Within normal limits in size.  Adrenals/Urinary tract: Several tiny less than 5 mm renal calculi are seen bilaterally. No evidence of ureteral calculi or hydronephrosis. Unremarkable unopacified urinary bladder.  Stomach/Bowel: Postop changes again seen from previous subtotal colectomy and sleeve gastrectomy. No evidence of obstruction, inflammatory process, or abnormal fluid collections.  Vascular/Lymphatic: 14 x 11 mm mesenteric soft tissue nodule or lymph node is again seen in the right abdominal mesentery on image 64/2, stable since prior exam. No other pathologically enlarged lymph nodes identified. No evidence of abdominal aortic aneurysm.  Reproductive: Prior hysterectomy noted. Adnexal regions are unremarkable in  appearance.  Other:  None.  Musculoskeletal: No suspicious bone lesions identified. Stable small sclerotic bone lesion in L4 vertebral body compared to previous CT in 2016, consistent with benign etiology.  IMPRESSION: Tiny bilateral renal calculi. No evidence of ureteral calculi, hydronephrosis, or other acute findings.  Stable small right abdominal mesenteric lymph node or soft tissue nodule. Recommend continued follow-up by CT in 6-12 months.   Electronically Signed By: Marlaine Hind M.D. On: 10/13/2021 12:38   Assessment & Plan:    1. Kidney stones -We discussed the management of kidney stones. These options include observation, ureteroscopy, shockwave lithotripsy (ESWL) and percutaneous nephrolithotomy (PCNL). We discussed which options are relevant to the patient's stone(s). We discussed the natural history of kidney stones as well as the complications of untreated stones and the impact on quality of life without treatment as well as with each of the above listed treatments. We also discussed the efficacy of each treatment in its ability to clear the stone burden. With any of these management options I discussed the signs and symptoms of infection and the need for emergent treatment should these be experienced. For each option we discussed the ability of each procedure to clear the patient of their stone burden.   For observation I described the risks which include but are not limited to silent renal damage, life-threatening infection, need for emergent surgery, failure to pass stone and pain.   For ureteroscopy I described the risks which include bleeding, infection, damage to contiguous structures, positioning injury, ureteral stricture, ureteral avulsion, ureteral injury, need for prolonged ureteral  stent, inability to perform ureteroscopy, need for an interval procedure, inability to clear stone burden, stent discomfort/pain, heart attack, stroke, pulmonary embolus and the  inherent risks with general anesthesia.   For shockwave lithotripsy I described the risks which include arrhythmia, kidney contusion, kidney hemorrhage, need for transfusion, pain, inability to adequately break up stone, inability to pass stone fragments, Steinstrasse, infection associated with obstructing stones, need for alternate surgical procedure, need for repeat shockwave lithotripsy, MI, CVA, PE and the inherent risks with anesthesia/conscious sedation.   For PCNL I described the risks including positioning injury, pneumothorax, hydrothorax, need for chest tube, inability to clear stone burden, renal laceration, arterial venous fistula or malformation, need for embolization of kidney, loss of kidney or renal function, need for repeat procedure, need for prolonged nephrostomy tube, ureteral avulsion, MI, CVA, PE and the inherent risks of general anesthesia.   - The patient would like to proceed with left ureteroscopic stone extraction. - Urinalysis, Routine w reflex microscopic   No follow-ups on file.  Nicolette Bang, MD  [facility] [place]

## 2022-10-18 ENCOUNTER — Encounter: Payer: Self-pay | Admitting: Urology

## 2022-10-18 ENCOUNTER — Telehealth: Payer: Self-pay

## 2022-10-18 MED ORDER — HYDROCODONE-ACETAMINOPHEN 10-325 MG PO TABS: 1.0000 | ORAL_TABLET | Freq: Four times a day (QID) | ORAL | 0 refills | Status: DC | PRN

## 2022-10-18 NOTE — Telephone Encounter (Signed)
Patient states the pharmacy is out of dilaudid nationwide.  She cannot take oxycodone because it gives her a headach.  Is there another pain med you can send in until her surgery?

## 2022-10-18 NOTE — Patient Instructions (Signed)
Ureteroscopy  Ureteroscopy is a procedure to check for and treat problems inside part of the urinary tract. In this procedure, a long rigid or flexible tube with a lens and light at the end (ureteroscope) is used to look at the inside of the kidneys and the ureters. The ureters are the tubes that carry urine from the kidneys to the bladder. The ureteroscope is inserted into one or both of the ureters. You may need this procedure if you have frequent urinary tract infections (UTIs), blood in your urine, or a stone in one or both of your ureters. A ureteroscopy can be done: To find the cause of urine blockage in a ureter and to evaluate other abnormalities inside the ureters or kidneys. To remove stones. To remove or treat growths of tissue (polyps), abnormal tissue, and some types of tumors. To remove a tissue sample and check it for disease under a microscope (biopsy). Tell a health care provider about: Any allergies you have. All medicines you are taking, including vitamins, herbs, eye drops, creams, and over-the-counter medicines. Any problems you or family members have had with anesthetic medicines. Any bleeding problems you have. Any surgeries you have had. Any medical conditions you have. Whether you are pregnant or may be pregnant. What are the risks? Your health care provider will talk with you about risks. These may include: Abdominal pain or a burning feeling or pain while urinating. Abnormal bleeding. A UTI. Allergic reactions to medicines. Scarring that narrows the ureter (stricture) or swelling. Creating a hole (perforation) in the ureter. Damage to other structures or organs, such as the part of your body that drains urine from your bladder (urethra), your bladder, or your uterus. What happens before the procedure? When to stop eating and drinking  8 hours before your procedure Stop eating most foods. Do not eat meat, fried foods, or fatty foods. Eat only light foods, such  as toast or crackers. All liquids are okay except energy drinks and alcohol. 6 hours before your procedure Stop eating. Drink only clear liquids, such as water, clear fruit juice, black coffee, plain tea, and sports drinks. Do not drink energy drinks or alcohol. 2 hours before your procedure Stop drinking all liquids. You may be allowed to take medicines with small sips of water. Medicines Ask your health care provider about: Changing or stopping your regular medicines. These include any diabetes medicines or blood thinners you take. Taking medicines such as aspirin and ibuprofen. These medicines can thin your blood. Do not take these medicines unless your health care provider tells you to. Taking over-the-counter medicines, vitamins, herbs, and supplements. General instructions Do not use any products that contain nicotine or tobacco for at least 4 weeks before the procedure. These products include cigarettes, chewing tobacco, and vaping devices, such as e-cigarettes. If you need help quitting, ask your health care provider. If you will be going home right after the procedure, plan to have a responsible adult: Take you home from the hospital or clinic. You will not be allowed to drive. Care for you for the time you are told. Ask your health care provider what steps will be taken to help prevent infection. These may include: Washing skin with a soap that kills germs. Receiving antibiotic medicine. Tests You may have an exam or testing. You may have a urine sample taken to check for infection. What happens during the procedure? An IV will be inserted into one of your veins. You may be given: A sedative. This helps   you relax. Anesthesia. This will: Numb certain areas of your body. Make you fall asleep for surgery. Your urethra will be cleaned with a germ-killing solution. The ureteroscope will be passed through your urethra into your bladder. A salt-water solution will be sent through  the ureteroscope to fill your bladder. This will help the health care provider see the openings of your ureters more clearly. The ureteroscope will be passed into your ureter. If a growth is found, a biopsy may be done. If a stone is found, it may be removed through the ureteroscope, or the stone may be broken up using a laser, shock waves, or electrical energy. In some cases, if the ureter is too small, a tube may be inserted that keeps the ureter open (ureteral stent). The stent may be left in place for 1 or 2 weeks, and then the ureteroscopy procedure will be done again. The scope will be removed, and your bladder will be emptied. The procedure may vary among health care providers and hospitals. What happens after the procedure? Your blood pressure, heart rate, breathing rate, and blood oxygen level will be monitored until you leave the hospital or clinic. It is up to you to get the results of your procedure. Ask your health care provider, or the department that is doing the procedure, when your results will be ready. Summary Ureteroscopy is a procedure used to look at the inside of the kidneys and the ureters. You may need this procedure if you have frequent urinary tract infections (UTIs), blood in your urine, or a stone in one or both of your ureters. Follow instructions from your health care provider about eating and drinking. In some cases, if the ureter is too small, a tube may be inserted that keeps the ureter open (ureteral stent). The stent may be left in place for 1 or 2 weeks to keep the ureter open, and then the ureteroscopy procedure will be done again. This information is not intended to replace advice given to you by your health care provider. Make sure you discuss any questions you have with your health care provider. Document Revised: 01/14/2022 Document Reviewed: 01/14/2022 Elsevier Patient Education  2023 [employer]  

## 2022-10-19 ENCOUNTER — Telehealth: Payer: Self-pay

## 2022-10-19 NOTE — Telephone Encounter (Signed)
Refill sent by MD

## 2022-10-19 NOTE — Telephone Encounter (Signed)
I spoke with Ms. Denise Avila. We have discussed possible surgery dates and 11/03/2022 was agreed upon by all parties. Patient given information about surgery date, what to expect pre-operatively and post operatively.    We discussed that a pre-op nurse will be calling to set up the pre-op visit that will take place prior to surgery. Informed patient that our office will communicate any additional care to be provided after surgery.    Patients questions or concerns were discussed during our call. Advised to call our office should there be any additional information, questions or concerns that arise. Patient verbalized understanding.

## 2022-10-20 ENCOUNTER — Telehealth: Payer: Self-pay

## 2022-10-20 NOTE — Telephone Encounter (Signed)
Patient left vm requesting a alternative pain med be called into pharmacy.  I returned her call and informed her Dr. Alyson Ingles sent in Hydrocodone on 01/16.  Patient voiced understanding.

## 2022-10-31 DIAGNOSIS — H5213 Myopia, bilateral: Secondary | ICD-10-CM | POA: Diagnosis not present

## 2022-11-01 ENCOUNTER — Other Ambulatory Visit: Payer: Self-pay

## 2022-11-01 ENCOUNTER — Encounter (HOSPITAL_COMMUNITY)
Admission: RE | Admit: 2022-11-01 | Discharge: 2022-11-01 | Disposition: A | Discharge status: Home / Self Care | Payer: Medicare HMO | Source: Ambulatory Visit | Attending: Urology | Admitting: Urology

## 2022-11-01 ENCOUNTER — Encounter (HOSPITAL_COMMUNITY): Payer: Self-pay

## 2022-11-01 NOTE — Pre-Procedure Instructions (Signed)
Attempted pre-op phone call. Left VM for her to call us back. 

## 2022-11-03 ENCOUNTER — Encounter (HOSPITAL_COMMUNITY)
Admission: RE | Disposition: A | Discharge status: Home / Self Care | Payer: Self-pay | Source: Ambulatory Visit | Attending: Urology

## 2022-11-03 ENCOUNTER — Ambulatory Visit (HOSPITAL_COMMUNITY)
Admission: RE | Admit: 2022-11-03 | Discharge: 2022-11-03 | Disposition: A | Discharge status: Home / Self Care | Payer: Medicare HMO | Source: Ambulatory Visit | Attending: Urology | Admitting: Urology

## 2022-11-03 ENCOUNTER — Ambulatory Visit (HOSPITAL_BASED_OUTPATIENT_CLINIC_OR_DEPARTMENT_OTHER): Payer: Medicare HMO | Admitting: Anesthesiology

## 2022-11-03 ENCOUNTER — Ambulatory Visit (HOSPITAL_COMMUNITY): Payer: Medicare HMO | Admitting: Anesthesiology

## 2022-11-03 ENCOUNTER — Encounter (HOSPITAL_COMMUNITY): Payer: Self-pay | Admitting: Urology

## 2022-11-03 ENCOUNTER — Ambulatory Visit (HOSPITAL_COMMUNITY): Payer: Medicare HMO

## 2022-11-03 DIAGNOSIS — N200 Calculus of kidney: Secondary | ICD-10-CM | POA: Diagnosis not present

## 2022-11-03 DIAGNOSIS — N201 Calculus of ureter: Secondary | ICD-10-CM

## 2022-11-03 DIAGNOSIS — F419 Anxiety disorder, unspecified: Secondary | ICD-10-CM | POA: Insufficient documentation

## 2022-11-03 DIAGNOSIS — G43909 Migraine, unspecified, not intractable, without status migrainosus: Secondary | ICD-10-CM | POA: Insufficient documentation

## 2022-11-03 DIAGNOSIS — K219 Gastro-esophageal reflux disease without esophagitis: Secondary | ICD-10-CM | POA: Insufficient documentation

## 2022-11-03 DIAGNOSIS — M797 Fibromyalgia: Secondary | ICD-10-CM | POA: Diagnosis not present

## 2022-11-03 DIAGNOSIS — F32A Depression, unspecified: Secondary | ICD-10-CM | POA: Insufficient documentation

## 2022-11-03 DIAGNOSIS — R569 Unspecified convulsions: Secondary | ICD-10-CM | POA: Diagnosis not present

## 2022-11-03 DIAGNOSIS — M1990 Unspecified osteoarthritis, unspecified site: Secondary | ICD-10-CM | POA: Diagnosis not present

## 2022-11-03 HISTORY — PX: CYSTOSCOPY WITH RETROGRADE PYELOGRAM, URETEROSCOPY AND STENT PLACEMENT: SHX5789

## 2022-11-03 HISTORY — PX: STONE EXTRACTION WITH BASKET: SHX5318

## 2022-11-03 SURGERY — CYSTOURETEROSCOPY, WITH RETROGRADE PYELOGRAM AND STENT INSERTION
Anesthesia: General | Site: Ureter | Laterality: Left

## 2022-11-03 MED ORDER — DEXAMETHASONE SODIUM PHOSPHATE 10 MG/ML IJ SOLN
INTRAMUSCULAR | Status: AC
  Filled 2022-11-03: qty 1

## 2022-11-03 MED ORDER — ONDANSETRON HCL 4 MG/2ML IJ SOLN
INTRAMUSCULAR | Status: AC
  Filled 2022-11-03: qty 2

## 2022-11-03 MED ORDER — DIATRIZOATE MEGLUMINE 30 % UR SOLN
URETHRAL | Status: AC
  Filled 2022-11-03: qty 100

## 2022-11-03 MED ORDER — ONDANSETRON HCL 4 MG/2ML IJ SOLN
INTRAMUSCULAR | Status: DC | PRN
  Administered 2022-11-03: 4 mg via INTRAVENOUS

## 2022-11-03 MED ORDER — EPHEDRINE SULFATE (PRESSORS) 50 MG/ML IJ SOLN
INTRAMUSCULAR | Status: DC | PRN
  Administered 2022-11-03: 5 mg via INTRAVENOUS

## 2022-11-03 MED ORDER — SODIUM CHLORIDE 0.9 % IR SOLN
Status: DC | PRN
  Administered 2022-11-03: 3000 mL

## 2022-11-03 MED ORDER — PHENYLEPHRINE HCL-NACL 20-0.9 MG/250ML-% IV SOLN
INTRAVENOUS | Status: DC | PRN
  Administered 2022-11-03: 20 ug/min via INTRAVENOUS

## 2022-11-03 MED ORDER — PHENYLEPHRINE 80 MCG/ML (10ML) SYRINGE FOR IV PUSH (FOR BLOOD PRESSURE SUPPORT)
PREFILLED_SYRINGE | INTRAVENOUS | Status: AC
  Filled 2022-11-03: qty 10

## 2022-11-03 MED ORDER — PROPOFOL 10 MG/ML IV BOLUS
INTRAVENOUS | Status: AC
  Filled 2022-11-03: qty 20

## 2022-11-03 MED ORDER — MIDAZOLAM HCL 2 MG/2ML IJ SOLN
INTRAMUSCULAR | Status: AC
  Filled 2022-11-03: qty 2

## 2022-11-03 MED ORDER — GENTAMICIN SULFATE 40 MG/ML IJ SOLN
5.0000 mg/kg | INTRAVENOUS | Status: AC
  Administered 2022-11-03: 420 mg via INTRAVENOUS
  Filled 2022-11-03: qty 10.5

## 2022-11-03 MED ORDER — ACETAMINOPHEN 500 MG PO TABS
1000.0000 mg | ORAL_TABLET | Freq: Once | ORAL | Status: AC
  Administered 2022-11-03: 1000 mg via ORAL

## 2022-11-03 MED ORDER — PHENYLEPHRINE HCL (PRESSORS) 10 MG/ML IV SOLN
INTRAVENOUS | Status: AC
  Filled 2022-11-03: qty 1

## 2022-11-03 MED ORDER — ACETAMINOPHEN 500 MG PO TABS
ORAL_TABLET | ORAL | Status: AC
  Filled 2022-11-03: qty 2

## 2022-11-03 MED ORDER — SULFAMETHOXAZOLE-TRIMETHOPRIM 800-160 MG PO TABS: 1.0000 | ORAL_TABLET | Freq: Two times a day (BID) | ORAL | 0 refills | Status: DC

## 2022-11-03 MED ORDER — LIDOCAINE HCL (CARDIAC) PF 100 MG/5ML IV SOSY
PREFILLED_SYRINGE | INTRAVENOUS | Status: DC | PRN
  Administered 2022-11-03: 60 mg via INTRAVENOUS

## 2022-11-03 MED ORDER — DIATRIZOATE MEGLUMINE 30 % UR SOLN
URETHRAL | Status: DC | PRN
  Administered 2022-11-03: 8 mL via URETHRAL

## 2022-11-03 MED ORDER — DEXAMETHASONE SODIUM PHOSPHATE 10 MG/ML IJ SOLN
INTRAMUSCULAR | Status: DC | PRN
  Administered 2022-11-03: 10 mg via INTRAVENOUS

## 2022-11-03 MED ORDER — CHLORHEXIDINE GLUCONATE 0.12 % MT SOLN
15.0000 mL | Freq: Once | OROMUCOSAL | Status: AC
  Administered 2022-11-03: 15 mL via OROMUCOSAL
  Filled 2022-11-03: qty 15

## 2022-11-03 MED ORDER — LIDOCAINE HCL (PF) 2 % IJ SOLN
INTRAMUSCULAR | Status: AC
  Filled 2022-11-03: qty 5

## 2022-11-03 MED ORDER — ONDANSETRON HCL 4 MG PO TABS: 4.0000 mg | ORAL_TABLET | Freq: Every day | ORAL | 1 refills | Status: AC | PRN

## 2022-11-03 MED ORDER — ORAL CARE MOUTH RINSE: 15.0000 mL | Freq: Once | OROMUCOSAL | Status: AC

## 2022-11-03 MED ORDER — ONDANSETRON HCL 4 MG/2ML IJ SOLN
4.0000 mg | Freq: Once | INTRAMUSCULAR | Status: AC | PRN
  Administered 2022-11-03: 4 mg via INTRAVENOUS
  Filled 2022-11-03: qty 2

## 2022-11-03 MED ORDER — HYDROMORPHONE HCL 1 MG/ML IJ SOLN
0.2500 mg | INTRAMUSCULAR | Status: DC | PRN
  Administered 2022-11-03: 0.5 mg via INTRAVENOUS
  Filled 2022-11-03: qty 0.5

## 2022-11-03 MED ORDER — MIDAZOLAM HCL 2 MG/2ML IJ SOLN
INTRAMUSCULAR | Status: DC | PRN
  Administered 2022-11-03: 2 mg via INTRAVENOUS

## 2022-11-03 MED ORDER — WATER FOR IRRIGATION, STERILE IR SOLN
Status: DC | PRN
  Administered 2022-11-03: 1000 mL

## 2022-11-03 MED ORDER — HYDROMORPHONE HCL 2 MG PO TABS: 2.0000 mg | ORAL_TABLET | ORAL | 0 refills | Status: DC | PRN

## 2022-11-03 MED ORDER — FENTANYL CITRATE (PF) 100 MCG/2ML IJ SOLN
INTRAMUSCULAR | Status: AC
  Filled 2022-11-03: qty 2

## 2022-11-03 MED ORDER — MEPERIDINE HCL 50 MG/ML IJ SOLN: 6.2500 mg | INTRAMUSCULAR | Status: DC | PRN

## 2022-11-03 MED ORDER — PROPOFOL 10 MG/ML IV BOLUS
INTRAVENOUS | Status: DC | PRN
  Administered 2022-11-03: 200 mg via INTRAVENOUS

## 2022-11-03 MED ORDER — FENTANYL CITRATE (PF) 250 MCG/5ML IJ SOLN
INTRAMUSCULAR | Status: DC | PRN
  Administered 2022-11-03: 25 ug via INTRAVENOUS

## 2022-11-03 MED ORDER — LACTATED RINGERS IV SOLN: INTRAVENOUS | Status: DC

## 2022-11-03 MED ORDER — PHENYLEPHRINE 80 MCG/ML (10ML) SYRINGE FOR IV PUSH (FOR BLOOD PRESSURE SUPPORT)
PREFILLED_SYRINGE | INTRAVENOUS | Status: DC | PRN
  Administered 2022-11-03 (×3): 80 ug via INTRAVENOUS
  Administered 2022-11-03: 160 ug via INTRAVENOUS

## 2022-11-03 SURGICAL SUPPLY — 23 items
BAG DRAIN URO TABLE W/ADPT NS (BAG) ×2 IMPLANT
BAG DRN 8 ADPR NS SKTRN CSTL (BAG) ×2
BAG HAMPER (MISCELLANEOUS) ×2 IMPLANT
CATH INTERMIT  6FR 70CM (CATHETERS) ×2 IMPLANT
CLOTH BEACON ORANGE TIMEOUT ST (SAFETY) ×2 IMPLANT
EXTRACTOR STONE NITINOL NGAGE (UROLOGICAL SUPPLIES) IMPLANT
GLOVE BIO SURGEON STRL SZ8 (GLOVE) ×2 IMPLANT
GLOVE BIOGEL PI IND STRL 7.0 (GLOVE) ×4 IMPLANT
GOWN STRL REUS W/TWL LRG LVL3 (GOWN DISPOSABLE) ×2 IMPLANT
GOWN STRL REUS W/TWL XL LVL3 (GOWN DISPOSABLE) ×2 IMPLANT
GUIDEWIRE STR DUAL SENSOR (WIRE) ×2 IMPLANT
GUIDEWIRE STR ZIPWIRE 035X150 (MISCELLANEOUS) ×2 IMPLANT
IV NS IRRIG 3000ML ARTHROMATIC (IV SOLUTION) ×4 IMPLANT
KIT TURNOVER CYSTO (KITS) ×2 IMPLANT
MANIFOLD NEPTUNE II (INSTRUMENTS) ×2 IMPLANT
PACK CYSTO (CUSTOM PROCEDURE TRAY) ×2 IMPLANT
PAD ARMBOARD 7.5X6 YLW CONV (MISCELLANEOUS) ×2 IMPLANT
SHEATH URETERAL 12FRX35CM (MISCELLANEOUS) IMPLANT
STENT URET 6FRX26 CONTOUR (STENTS) IMPLANT
SYR 10ML LL (SYRINGE) ×2 IMPLANT
SYR CONTROL 10ML LL (SYRINGE) ×2 IMPLANT
TOWEL OR 17X26 4PK STRL BLUE (TOWEL DISPOSABLE) ×2 IMPLANT
WATER STERILE IRR 500ML POUR (IV SOLUTION) ×2 IMPLANT

## 2022-11-03 NOTE — Transfer of Care (Signed)
Immediate Anesthesia Transfer of Care Note  Patient: Denise Avila  Procedure(s) Performed: CYSTOSCOPY WITH RETROGRADE PYELOGRAM, URETEROSCOPY AND STENT PLACEMENT (Left) STONE EXTRACTION WITH BASKET (Left: Ureter)  Patient Location: PACU  Anesthesia Type:General  Level of Consciousness: drowsy  Airway & Oxygen Therapy: Patient Spontanous Breathing  Post-op Assessment: Report given to RN and Post -op Vital signs reviewed and stable  Post vital signs: Reviewed and stable  Last Vitals:  Vitals Value Taken Time  BP 106/72 11/03/22 1115  Temp 36.7 C 11/03/22 1115  Pulse 74 11/03/22 1116  Resp 11 11/03/22 1116  SpO2 95 % 11/03/22 1116  Vitals shown include unvalidated device data.  Last Pain:  Vitals:   11/03/22 0948  TempSrc: Oral  PainSc: 3          Complications: No notable events documented.

## 2022-11-03 NOTE — Anesthesia Preprocedure Evaluation (Signed)
Anesthesia Evaluation  Patient identified by MRN, date of birth, ID band Patient awake    Reviewed: Allergy & Precautions, H&P , NPO status , Patient's Chart, lab work & pertinent test results  Airway Mallampati: II  TM Distance: >3 FB Neck ROM: Full    Dental  (+) Dental Advisory Given,    Pulmonary neg pulmonary ROS, neg shortness of breath   Pulmonary exam normal breath sounds clear to auscultation       Cardiovascular negative cardio ROS Normal cardiovascular exam Rhythm:Regular Rate:Normal     Neuro/Psych  Headaches, Seizures -, Well Controlled,  PSYCHIATRIC DISORDERS Anxiety Depression     Neuromuscular disease    GI/Hepatic Neg liver ROS,GERD  Medicated and Controlled,,  Endo/Other  negative endocrine ROS  Adrenal insufficiency   Renal/GU Renal disease  negative genitourinary   Musculoskeletal  (+) Arthritis , Osteoarthritis,  Fibromyalgia -  Abdominal   Peds negative pediatric ROS (+)  Hematology negative hematology ROS (+)   Anesthesia Other Findings   Reproductive/Obstetrics negative OB ROS                             Anesthesia Physical Anesthesia Plan  ASA: 2  Anesthesia Plan: General   Post-op Pain Management: Dilaudid IV   Induction:   PONV Risk Score and Plan: 3 and Ondansetron and Dexamethasone  Airway Management Planned: LMA  Additional Equipment:   Intra-op Plan:   Post-operative Plan: Extubation in OR  Informed Consent: I have reviewed the patients History and Physical, chart, labs and discussed the procedure including the risks, benefits and alternatives for the proposed anesthesia with the patient or authorized representative who has indicated his/her understanding and acceptance.     Dental advisory given  Plan Discussed with: CRNA and Surgeon  Anesthesia Plan Comments:         Anesthesia Quick Evaluation

## 2022-11-03 NOTE — Anesthesia Procedure Notes (Signed)
Procedure Name: LMA Insertion Date/Time: 11/03/2022 10:23 AM  Performed by: Karna Dupes, CRNAPre-anesthesia Checklist: Emergency Drugs available, Patient identified, Suction available and Patient being monitored Patient Re-evaluated:Patient Re-evaluated prior to induction Oxygen Delivery Method: Circle system utilized Preoxygenation: Pre-oxygenation with 100% oxygen Induction Type: IV induction LMA: LMA inserted LMA Size: 4.0 Number of attempts: 1 Placement Confirmation: positive ETCO2 and breath sounds checked- equal and bilateral Tube secured with: Tape Dental Injury: Teeth and Oropharynx as per pre-operative assessment

## 2022-11-03 NOTE — Interval H&P Note (Signed)
History and Physical Interval Note:  11/03/2022 10:01 AM  Denise Avila  has presented today for surgery, with the diagnosis of left renal calculus.  The various methods of treatment have been discussed with the patient and family. After consideration of risks, benefits and other options for treatment, the patient has consented to  Procedure(s) with comments: CYSTOSCOPY WITH RETROGRADE PYELOGRAM, URETEROSCOPY AND STENT PLACEMENT (Left) - pt knows to arrive at 9:00 HOLMIUM LASER APPLICATION (Left) as a surgical intervention.  The patient's history has been reviewed, patient examined, no change in status, stable for surgery.  I have reviewed the patient's chart and labs.  Questions were answered to the patient's satisfaction.     Nicolette Bang

## 2022-11-03 NOTE — Anesthesia Postprocedure Evaluation (Signed)
Anesthesia Post Note  Patient: [name]  Procedure(s) Performed: CYSTOSCOPY WITH RETROGRADE PYELOGRAM, URETEROSCOPY AND STENT PLACEMENT (Left) STONE EXTRACTION WITH BASKET (Left: Ureter)  Patient location during evaluation: Phase II Anesthesia Type: General Level of consciousness: awake and alert and oriented Pain management: pain level controlled Vital Signs Assessment: post-procedure vital signs reviewed and stable Respiratory status: spontaneous breathing, nonlabored ventilation and respiratory function stable Cardiovascular status: blood pressure returned to baseline and stable Postop Assessment: no apparent nausea or vomiting Anesthetic complications: no  No notable events documented.   Last Vitals:  Vitals:   11/03/22 1145 11/03/22 1150  BP:  (!) 93/57  Pulse: 75 75  Resp: 13 (!) 22  Temp:    SpO2: 99% 99%    Last Pain:  Vitals:   11/03/22 1215  TempSrc:   PainSc: Asleep                 Jayston Trevino C Charles Niese

## 2022-11-03 NOTE — Op Note (Signed)
.  Preoperative diagnosis: Left ureteral stone  Postoperative diagnosis: Same  Procedure: 1 cystoscopy 2. Left retrograde pyelography 3.  Intraoperative fluoroscopy, under one hour, with interpretation 4.  Left ureteroscopic stone manipulation with basket extraction 5.  Left 6 x 26 JJ stent placement  Attending: Rosie Fate  Anesthesia: General  Estimated blood loss: None  Drains: Left 6 x 26 JJ ureteral stent with tether  Specimens: stone for analysis  Antibiotics: gentamicin  Findings: left lower pole stone. No hydronephrosis. No masses/lesions in the bladder. Ureteral orifices in normal anatomic location.  Indications: Patient is a 47 year old female with a history of left renal stone and who has persistent left flank pain.  After discussing treatment options, she decided proceed with left ureteroscopic stone manipulation.  Procedure in detail: The patient was brought to the operating room and a brief timeout was done to ensure correct patient, correct procedure, correct site.  General anesthesia was administered patient was placed in dorsal lithotomy position.  Her genitalia was then prepped and draped in usual sterile fashion.  A rigid 61 French cystoscope was passed in the urethra and the bladder.  Bladder was inspected free masses or lesions.  the ureteral orifices were in the normal orthotopic locations.  a 6 french ureteral catheter was then instilled into the left ureteral orifice.  a gentle retrograde was obtained and findings noted above.  we then placed a zip wire through the ureteral catheter and advanced up to the renal pelvis.  we then removed the cystoscope and cannulated the left ureteral orifice with a semirigid ureteroscope.  No stone was found in the ureter. Once we reached the UPJ a sensor wire was advanced in to the renal pelvis. We then removed the ureteroscope and advanced am 12/14 x 36cm access sheath up to the renal pelvis. We then used the flexible  ureteroscope to perform nephroscopy. We encountered the stones in the lower pole.    the stones were then removed with a Ngage basket.    once all stones were removed we then removed the access sheath under direct vision and noted no injury to the ureter. We then placed a 6 x 26 double-j ureteral stent over the original zip wire.  We then removed the wire and good coil was noted in the the renal pelvis under fluoroscopy and the bladder under direct vision. the bladder was then drained and this concluded the procedure which was well tolerated by patient.  Complications: None  Condition: Stable, extubated, transferred to PACU  Plan: Patient is to be discharged home as to follow-up in one week. She is to remove her stent in 72 hours by pulling the tether

## 2022-11-04 ENCOUNTER — Telehealth: Payer: Self-pay

## 2022-11-04 MED ORDER — FLUCONAZOLE 150 MG PO TABS: 150.0000 mg | ORAL_TABLET | Freq: Every day | ORAL | 0 refills | Status: DC

## 2022-11-04 NOTE — Telephone Encounter (Signed)
Patient is requesting a rx for diflucan for a yeast infection since she is taking antibiotics. Verbal from Dr. Alyson Ingles ok to send in.  Rx sent to pharmacy and patient notified via mychart.

## 2022-11-09 ENCOUNTER — Encounter (HOSPITAL_COMMUNITY): Payer: Self-pay | Admitting: Urology

## 2022-11-09 DIAGNOSIS — F330 Major depressive disorder, recurrent, mild: Secondary | ICD-10-CM | POA: Diagnosis not present

## 2022-11-10 DIAGNOSIS — H524 Presbyopia: Secondary | ICD-10-CM | POA: Diagnosis not present

## 2022-11-11 DIAGNOSIS — G5732 Lesion of lateral popliteal nerve, left lower limb: Secondary | ICD-10-CM | POA: Diagnosis not present

## 2022-11-11 DIAGNOSIS — G629 Polyneuropathy, unspecified: Secondary | ICD-10-CM | POA: Diagnosis not present

## 2022-11-14 ENCOUNTER — Encounter (HOSPITAL_COMMUNITY): Payer: Self-pay | Admitting: Urology

## 2022-11-16 ENCOUNTER — Ambulatory Visit: Payer: Medicare HMO | Admitting: Urology

## 2022-11-16 VITALS — BP 109/76 | HR 96

## 2022-11-16 DIAGNOSIS — N3021 Other chronic cystitis with hematuria: Secondary | ICD-10-CM

## 2022-11-16 DIAGNOSIS — R350 Frequency of micturition: Secondary | ICD-10-CM

## 2022-11-16 DIAGNOSIS — N200 Calculus of kidney: Secondary | ICD-10-CM | POA: Diagnosis not present

## 2022-11-16 DIAGNOSIS — R81 Glycosuria: Secondary | ICD-10-CM

## 2022-11-16 DIAGNOSIS — R3915 Urgency of urination: Secondary | ICD-10-CM

## 2022-11-16 LAB — URINALYSIS, ROUTINE W REFLEX MICROSCOPIC
Bilirubin, UA: NEGATIVE
Ketones, UA: NEGATIVE
Leukocytes,UA: NEGATIVE
Nitrite, UA: NEGATIVE
RBC, UA: NEGATIVE
Specific Gravity, UA: 1.03 (ref 1.005–1.030)
Urobilinogen, Ur: 1 mg/dL (ref 0.2–1.0)
pH, UA: 5 (ref 5.0–7.5)

## 2022-11-16 LAB — MICROSCOPIC EXAMINATION

## 2022-11-16 MED ORDER — FLUCONAZOLE 150 MG PO TABS: 150.0000 mg | ORAL_TABLET | Freq: Every day | ORAL | 0 refills | Status: DC

## 2022-11-16 MED ORDER — HYDROMORPHONE HCL 2 MG PO TABS: 2.0000 mg | ORAL_TABLET | ORAL | 0 refills | Status: DC | PRN

## 2022-11-16 MED ORDER — NITROFURANTOIN MONOHYD MACRO 100 MG PO CAPS: 100.0000 mg | ORAL_CAPSULE | Freq: Two times a day (BID) | ORAL | 0 refills | Status: DC

## 2022-11-16 NOTE — Progress Notes (Signed)
11/16/2022 12:01 PM   Denise Avila Feb 03, 1976 [id number]  Referring provider: Lennie Odor, [facility] [place],  [place] [address]  Followup nephrolithiasis   HPI: [name] is a 47yo here for followup for nephrolithiasis. She she intermittent left flank pain currently. She has seen debris/sand in her urine. She is currently on sodium bicarb. Stone composition pending. He has urinary frequency and urgency which has been worse for the past 3 days. UA is concerning for infection   PMH: Past Medical History:  Diagnosis Date   Abnormal Pap smear of cervix 12/2014   Acid reflux    Adrenal insufficiency (HCC)    Anxiety    Arthritis    Arthritis    Depression    Depression    Endometriosis    Fibroids    age 76   Fibromyalgia    High cholesterol    History of kidney stones    Liver disease    per pt report, will have biopsy Nov 08, 2017   Migraines    Pleurisy 02/2015   while hospitalized    Seizure ([place])    on meds and no seizures in 2-3 years. Unknown etiology   Skin cancer (melanoma) ([place])    Syncope and collapse    Uterine cancer [facility])     Surgical History: Past Surgical History:  Procedure Laterality Date   ABDOMINAL HYSTERECTOMY     ABLATION     uterine   COLECTOMY     CYSTOSCOPY W/ URETERAL STENT PLACEMENT Left 02/02/2015   Procedure: CYSTOSCOPY WITH RETROGRADE PYELOGRAM/URETERAL STENT PLACEMENT;  Surgeon: Festus Aloe, MD;  Location: [facility];  Service: Urology;  Laterality: Left;   CYSTOSCOPY WITH RETROGRADE PYELOGRAM, URETEROSCOPY AND STENT PLACEMENT Bilateral 01/20/2022   Procedure: CYSTOSCOPY WITH RETROGRADE PYELOGRAM, URETEROSCOPY AND STENT PLACEMENT;  Surgeon: Cleon Gustin, MD;  Location: [facility];  Service: Urology;  Laterality: Bilateral;   CYSTOSCOPY WITH RETROGRADE PYELOGRAM, URETEROSCOPY AND STENT PLACEMENT Left 11/03/2022   Procedure: CYSTOSCOPY WITH RETROGRADE PYELOGRAM, URETEROSCOPY AND STENT PLACEMENT;  Surgeon:  Cleon Gustin, MD;  Location: [facility];  Service: Urology;  Laterality: Left;  pt knows to arrive at 9:00   [place] Left 02/13/2015   Procedure: LEFT URETEROSCOPY WITH HOLMIUM LASER AND STENT PLACEMENT;  Surgeon: Festus Aloe, MD;  Location: [facility];  Service: Urology;  Laterality: Left;   DILATION AND CURETTAGE OF UTERUS     ESOPHAGOGASTRODUODENOSCOPY ENDOSCOPY  10/11/2017   @ [facility]; BIOPSY OF LOWER INTESTINE   EXTRACORPOREAL SHOCK WAVE LITHOTRIPSY Left 09/13/2022   Procedure: EXTRACORPOREAL SHOCK WAVE LITHOTRIPSY (ESWL);  Surgeon: Cleon Gustin, MD;  Location: [facility];  Service: Urology;  Laterality: Left;   HOLMIUM LASER APPLICATION N/A [id number]   Procedure: HOLMIUM LASER APPLICATION;  Surgeon: Festus Aloe, MD;  Location: [facility];  Service: Urology;  Laterality: N/A;   IR NEPHROSTOMY PLACEMENT RIGHT  07/05/2021   KNEE SURGERY Bilateral 2019   realignment of knees   LAPAROSCOPIC GASTRIC SLEEVE RESECTION     LAPAROSCOPY     x 4   MELANOMA EXCISION  2000   melanoma removed from face, basal cell removed from nose   NEPHROLITHOTOMY Right 07/08/2021   Procedure: NEPHROLITHOTOMY PERCUTANEOUS- with stent placement;  Surgeon: Cleon Gustin, MD;  Location: [facility];  Service: Urology;  Laterality: Right;   REPAIR VAGINAL CUFF N/A 08/21/2015   Procedure: REPAIR VAGINAL CUFF, EXAM UNDER ANESTHESIA;  Surgeon: Everitt Amber, MD;  Location:  [facility];  Service: Gynecology;  Laterality: N/A;   ROBOTIC ASSISTED TOTAL HYSTERECTOMY WITH BILATERAL SALPINGO OOPHERECTOMY Bilateral 05/21/2015   Procedure: XI ROBOTIC ASSISTED TOTAL HYSTERECTOMY WITH BILATERAL SALPINGO OOPHORECTOMY;  Surgeon: Everitt Amber, MD;  Location: [facility];  Service: Gynecology;  Laterality: Bilateral;   STONE EXTRACTION WITH BASKET Bilateral 01/20/2022   Procedure: STONE EXTRACTION WITH BASKET;  Surgeon: Cleon Gustin, MD;  Location: [facility];  Service: Urology;  Laterality: Bilateral;    STONE EXTRACTION WITH BASKET Left 11/03/2022   Procedure: STONE EXTRACTION WITH BASKET;  Surgeon: Cleon Gustin, MD;  Location: [facility];  Service: Urology;  Laterality: Left;    Home Medications:  Allergies as of 11/16/2022       Reactions   Penicillins Swelling, Rash   Rash all over [place] and lip swelling. Has patient had a PCN reaction causing immediate rash, facial/tongue/throat swelling, SOB or lightheadedness with hypotension: Yes Has patient had a PCN reaction causing severe rash involving mucus membranes or skin necrosis: Yes Has patient had a PCN reaction that required hospitalization: No If all of the above answers are "NO", then may proceed with Cephalosporin use.   Nortriptyline Other (See Comments)   Severe chest pain    Nsaids Other (See Comments)   Advised not to take due to gastric sleeve    Amoxicillin Rash   Rash all over [place]   Ciprofloxacin Diarrhea, Nausea Only        Medication List        Accurate as of November 16, 2022 12:01 PM. If you have any questions, ask your nurse or doctor.          acetaminophen 500 MG tablet Commonly known as: TYLENOL Take 1,000 mg by mouth every 6 (six) hours as needed for moderate pain.   albuterol 108 (90 Base) MCG/ACT inhaler Commonly known as: VENTOLIN HFA Inhale 1-2 puffs into the lungs every 6 (six) hours as needed for shortness of breath or wheezing.   allopurinol 300 MG tablet Commonly known as: ZYLOPRIM TAKE 1 TABLET EVERY DAY What changed: when to take this   baclofen 10 MG tablet Commonly known as: LIORESAL Take 10 mg by mouth every 8 (eight) hours.   celecoxib 200 MG capsule Commonly known as: CELEBREX Take 200 mg by mouth in the morning.   citalopram 10 MG tablet Commonly known as: CELEXA Take 30 mg by mouth at bedtime.   clonazePAM 1 MG tablet Commonly known as: KLONOPIN Take 1 mg by mouth at bedtime.   diphenhydrAMINE 25 MG tablet Commonly known as: BENADRYL Take 25 mg by mouth  every 6 (six) hours as needed (take with for migraines).   DULoxetine 60 MG capsule Commonly known as: CYMBALTA Take 60 mg by mouth at bedtime.   DULoxetine 30 MG capsule Commonly known as: CYMBALTA Take 30 mg by mouth in the morning.   ezetimibe 10 MG tablet Commonly known as: ZETIA Take 10 mg by mouth in the morning.   fluconazole 150 MG tablet Commonly known as: Diflucan Take 1 tablet (150 mg total) by mouth daily.   gabapentin 800 MG tablet Commonly known as: NEURONTIN Take 2 tablets (1,600 mg total) by mouth 2 (two) times daily.   Gas Relief 250 MG Caps Generic drug: Simethicone Take 250 mg by mouth 3 (three) times daily as needed (gas/bloating).   HYDROcodone-acetaminophen 10-325 MG tablet Commonly known as: Norco Take 1 tablet by mouth every 6 (six) hours as needed.   HYDROmorphone 2 MG tablet  Commonly known as: DILAUDID Take 1 tablet (2 mg total) by mouth every 4 (four) hours as needed for severe pain.   lamoTRIgine 200 MG tablet Commonly known as: LAMICTAL Take 200 mg by mouth at bedtime.   methylphenidate 5 MG tablet Commonly known as: RITALIN Take 5 mg by mouth at bedtime.   mirabegron ER 25 MG Tb24 tablet Commonly known as: MYRBETRIQ Take 1 tablet (25 mg total) by mouth daily. What changed: when to take this   multivitamin with minerals Tabs tablet Take 1 tablet by mouth in the morning. One A Day   nitrofurantoin 50 MG capsule Commonly known as: MACRODANTIN Take 1 capsule (50 mg total) by mouth at bedtime.   omeprazole 40 MG capsule Commonly known as: PRILOSEC Take 40 mg by mouth 2 (two) times daily.   ondansetron 4 MG tablet Commonly known as: Zofran Take 1 tablet (4 mg total) by mouth daily as needed for nausea or vomiting.   oxyCODONE 5 MG immediate release tablet Commonly known as: Roxicodone Take 1 tablet (5 mg total) by mouth every 4 (four) hours as needed for severe pain.   oxyCODONE-acetaminophen 5-325 MG tablet Commonly known as:  Percocet Take 1 tablet by mouth every 4 (four) hours as needed.   phenazopyridine 200 MG tablet Commonly known as: Pyridium Take 1 tablet (200 mg total) by mouth 3 (three) times daily as needed for pain.   prochlorperazine 10 MG tablet Commonly known as: COMPAZINE Take 10 mg by mouth every 6 (six) hours as needed (migraines (nausea)).   promethazine 25 MG tablet Commonly known as: PHENERGAN Take 1 tablet (25 mg total) by mouth 4 (four) times daily as needed for nausea or vomiting.   QUEtiapine 25 MG tablet Commonly known as: SEROQUEL Take 50 mg by mouth at bedtime.   rosuvastatin 5 MG tablet Commonly known as: CRESTOR Take 5 mg by mouth at bedtime.   sodium bicarbonate 650 MG tablet Take 1 tablet (650 mg total) by mouth 2 (two) times daily.   sulfamethoxazole-trimethoprim 800-160 MG tablet Commonly known as: BACTRIM DS Take 1 tablet by mouth 2 (two) times daily.   SYSTANE OP Place 1 drop into both eyes in the morning, at noon, and at bedtime.   tamsulosin 0.4 MG Caps capsule Commonly known as: FLOMAX Take 1 capsule (0.4 mg total) by mouth daily after supper. What changed: when to take this   tiZANidine 2 MG tablet Commonly known as: ZANAFLEX Take 2 mg by mouth every 8 (eight) hours.        Allergies:  Allergies  Allergen Reactions   Penicillins Swelling and Rash    Rash all over [place] and lip swelling. Has patient had a PCN reaction causing immediate rash, facial/tongue/throat swelling, SOB or lightheadedness with hypotension: Yes Has patient had a PCN reaction causing severe rash involving mucus membranes or skin necrosis: Yes Has patient had a PCN reaction that required hospitalization: No If all of the above answers are "NO", then may proceed with Cephalosporin use.   Nortriptyline Other (See Comments)    Severe chest pain     Nsaids Other (See Comments)    Advised not to take due to gastric sleeve    Amoxicillin Rash    Rash all over [place]     Ciprofloxacin Diarrhea and Nausea Only    Family History: Family History  Problem Relation Age of Onset   Hypertension Mother    Melanoma Mother    Breast cancer Mother 91   Cancer -  Other Father        liver   Diabetes Mellitus II Maternal Grandmother    Heart disease Maternal Grandmother    Breast cancer Maternal Grandmother    Cancer - Lung Maternal Grandfather    Osteoporosis Paternal Grandmother    Melanoma Paternal Grandfather     Social History:  reports that she has never smoked. She has never used smokeless tobacco. She reports that she does not drink alcohol and does not use drugs.  ROS: All other review of systems were reviewed and are negative except what is noted above in HPI  Physical Exam: BP 109/76   Pulse 96   LMP 05/04/2015 (Approximate)   Constitutional:  Alert and oriented, No acute distress. HEENT: [place] AT, moist mucus membranes.  Trachea midline, no masses. Cardiovascular: No clubbing, cyanosis, or edema. Respiratory: Normal respiratory effort, no increased work of breathing. GI: Abdomen is soft, nontender, nondistended, no abdominal masses GU: No CVA tenderness.  Lymph: No cervical or inguinal lymphadenopathy. Skin: No rashes, bruises or suspicious lesions. Neurologic: Grossly intact, no focal deficits, moving all 4 extremities. Psychiatric: Normal mood and affect.  Laboratory Data: Lab Results  Component Value Date   WBC 11.0 (H) 07/09/2021   HGB 11.6 (L) 07/09/2021   HCT 35.1 (L) 07/09/2021   MCV 94.1 07/09/2021   PLT 167 07/09/2021    Lab Results  Component Value Date   CREATININE 0.67 01/17/2022    No results found for: "PSA"  No results found for: "TESTOSTERONE"  Lab Results  Component Value Date   HGBA1C 5.3 10/13/2021    Urinalysis    Component Value Date/Time   COLORURINE YELLOW (A) 06/21/2017 1735   APPEARANCEUR Clear 10/14/2022 1023   LABSPEC 1.019 06/21/2017 1735   PHURINE 5.0 06/21/2017 1735   GLUCOSEU Negative  10/14/2022 1023   HGBUR NEGATIVE 06/21/2017 1735   BILIRUBINUR Negative 10/14/2022 1023   KETONESUR NEGATIVE 06/21/2017 1735   PROTEINUR 1+ (A) 10/14/2022 1023   PROTEINUR NEGATIVE 06/21/2017 1735   UROBILINOGEN 0.2 01/27/2020 1124   UROBILINOGEN 1.0 05/18/2015 1359   NITRITE Negative 10/14/2022 1023   NITRITE NEGATIVE 06/21/2017 1735   LEUKOCYTESUR Negative 10/14/2022 1023    Lab Results  Component Value Date   LABMICR See below: 10/14/2022   WBCUA 0-5 10/14/2022   LABEPIT 0-10 10/14/2022   MUCUS Present 12/03/2021   BACTERIA None seen 10/14/2022    Pertinent Imaging:  Results for orders placed during the hospital encounter of 10/14/22  DG Abd 1 View  Narrative CLINICAL DATA:  Status post ESWL  EXAM: ABDOMEN - 1 VIEW  COMPARISON:  09/21/2022  FINDINGS: The bowel gas pattern is normal. 6 mm calcification overlying the lower pole left kidney. No additional stones identified. Noncontrast CT recommended if there is a clinical desire to assess for stones more definitively.  IMPRESSION: Possible left-sided stone.  Unremarkable bowel gas pattern.   Electronically Signed By: Sammie Bench M.D. On: 10/16/2022 01:21  No results found for this or any previous visit.  No results found for this or any previous visit.  No results found for this or any previous visit.  Results for orders placed during the hospital encounter of 10/07/22  Ultrasound renal complete  Narrative CLINICAL DATA:  Nephrolithiasis.  EXAM: RENAL / URINARY TRACT ULTRASOUND COMPLETE  COMPARISON:  Abdominal radiograph September 13, 2022; renal stone CT 10/13/2021  FINDINGS: Right Kidney:  Renal measurements: 10.3 x 4.7 x 5.7 cm = volume: 145 mL. Echogenicity within normal limits. No  mass or hydronephrosis visualized.  Left Kidney:  Renal measurements: 11.4 x 5.6 x 5.0 cm = volume: 166.7 mL. Echogenicity within normal limits. No mass or hydronephrosis visualized. There is an 8 mm  stone within the inferior pole of the left kidney.  Bladder:  Appears normal for degree of bladder distention.  Other:  None.  IMPRESSION: 1. No hydronephrosis. 2. Left nephrolithiasis.   Electronically Signed By: Lovey Newcomer M.D. On: 10/07/2022 09:59  No valid procedures specified. No results found for this or any previous visit.  Results for orders placed during the hospital encounter of 10/13/21  CT RENAL STONE STUDY  Narrative CLINICAL DATA:  Right-sided flank pain and hematuria for 1 month. Nephrolithiasis.  EXAM: CT ABDOMEN AND PELVIS WITHOUT CONTRAST  TECHNIQUE: Multidetector CT imaging of the abdomen and pelvis was performed following the standard protocol without IV contrast.  RADIATION DOSE REDUCTION: This exam was performed according to the departmental dose-optimization program which includes automated exposure control, adjustment of the mA and/or kV according to patient size and/or use of iterative reconstruction technique.  COMPARISON:  01/01/2020  FINDINGS: Lower chest: No acute findings.  Hepatobiliary: No mass visualized on this unenhanced exam. Gallbladder is unremarkable. No evidence of biliary ductal dilatation.  Pancreas: No mass or inflammatory process visualized on this unenhanced exam.  Spleen:  Within normal limits in size.  Adrenals/Urinary tract: Several tiny less than 5 mm renal calculi are seen bilaterally. No evidence of ureteral calculi or hydronephrosis. Unremarkable unopacified urinary bladder.  Stomach/Bowel: Postop changes again seen from previous subtotal colectomy and sleeve gastrectomy. No evidence of obstruction, inflammatory process, or abnormal fluid collections.  Vascular/Lymphatic: 14 x 11 mm mesenteric soft tissue nodule or lymph node is again seen in the right abdominal mesentery on image 64/2, stable since prior exam. No other pathologically enlarged lymph nodes identified. No evidence of abdominal  aortic aneurysm.  Reproductive: Prior hysterectomy noted. Adnexal regions are unremarkable in appearance.  Other:  None.  Musculoskeletal: No suspicious bone lesions identified. Stable small sclerotic bone lesion in L4 vertebral body compared to previous CT in 2016, consistent with benign etiology.  IMPRESSION: Tiny bilateral renal calculi. No evidence of ureteral calculi, hydronephrosis, or other acute findings.  Stable small right abdominal mesenteric lymph node or soft tissue nodule. Recommend continued follow-up by CT in 6-12 months.   Electronically Signed By: Marlaine Hind M.D. On: 10/13/2021 12:38   Assessment & Plan:    1. Kidney stones -continue sodium bicarb -dilaudid 17m PO tabs - Urinalysis, Routine w reflex microscopic  2. Acute cystitis -urine for culture -macrobid 1054mBID for 7 days  3. Glycosuria -patient instructed to contact PCP for DMII evaluation   No follow-ups on file.  PaNicolette BangMD  [facility] [place]

## 2022-11-18 ENCOUNTER — Encounter: Payer: Self-pay | Admitting: Urology

## 2022-11-18 DIAGNOSIS — Z8639 Personal history of other endocrine, nutritional and metabolic disease: Secondary | ICD-10-CM | POA: Diagnosis not present

## 2022-11-18 DIAGNOSIS — R7303 Prediabetes: Secondary | ICD-10-CM | POA: Diagnosis not present

## 2022-11-18 DIAGNOSIS — D849 Immunodeficiency, unspecified: Secondary | ICD-10-CM | POA: Diagnosis not present

## 2022-11-18 DIAGNOSIS — R42 Dizziness and giddiness: Secondary | ICD-10-CM | POA: Diagnosis not present

## 2022-11-18 DIAGNOSIS — R81 Glycosuria: Secondary | ICD-10-CM | POA: Diagnosis not present

## 2022-11-18 DIAGNOSIS — G35 Multiple sclerosis: Secondary | ICD-10-CM | POA: Diagnosis not present

## 2022-11-18 LAB — CALCULI, WITH PHOTOGRAPH (CLINICAL LAB)
Calcium Oxalate Dihydrate: 70 %
Calcium Oxalate Monohydrate: 30 %
Weight Calculi: 16 mg

## 2022-11-18 LAB — URINE CULTURE

## 2022-11-18 NOTE — Patient Instructions (Signed)

## 2022-11-28 ENCOUNTER — Other Ambulatory Visit: Payer: Self-pay | Admitting: Urology

## 2022-11-28 DIAGNOSIS — N200 Calculus of kidney: Secondary | ICD-10-CM

## 2022-11-28 DIAGNOSIS — N3021 Other chronic cystitis with hematuria: Secondary | ICD-10-CM

## 2022-12-07 DIAGNOSIS — F5104 Psychophysiologic insomnia: Secondary | ICD-10-CM | POA: Diagnosis not present

## 2022-12-07 DIAGNOSIS — F4310 Post-traumatic stress disorder, unspecified: Secondary | ICD-10-CM | POA: Diagnosis not present

## 2022-12-07 DIAGNOSIS — F330 Major depressive disorder, recurrent, mild: Secondary | ICD-10-CM | POA: Diagnosis not present

## 2022-12-09 ENCOUNTER — Ambulatory Visit (HOSPITAL_COMMUNITY)
Admission: RE | Admit: 2022-12-09 | Discharge: 2022-12-09 | Disposition: A | Discharge status: Home / Self Care | Payer: Medicare HMO | Source: Ambulatory Visit | Attending: Urology | Admitting: Urology

## 2022-12-09 DIAGNOSIS — N200 Calculus of kidney: Secondary | ICD-10-CM | POA: Insufficient documentation

## 2022-12-12 ENCOUNTER — Ambulatory Visit: Payer: Medicare HMO | Admitting: Urology

## 2022-12-12 VITALS — BP 108/74 | HR 93 | Ht 72.0 in | Wt 223.0 lb

## 2022-12-12 DIAGNOSIS — N200 Calculus of kidney: Secondary | ICD-10-CM | POA: Diagnosis not present

## 2022-12-12 LAB — URINALYSIS, ROUTINE W REFLEX MICROSCOPIC
Bilirubin, UA: NEGATIVE
Glucose, UA: NEGATIVE
Leukocytes,UA: NEGATIVE
Nitrite, UA: NEGATIVE
Protein,UA: NEGATIVE
RBC, UA: NEGATIVE
Specific Gravity, UA: 1.03 (ref 1.005–1.030)
Urobilinogen, Ur: 0.2 mg/dL (ref 0.2–1.0)
pH, UA: 5 (ref 5.0–7.5)

## 2022-12-12 NOTE — Progress Notes (Unsigned)
12/12/2022 10:12 AM   Denise Avila Apr 09, 1976 [id number]  Referring provider: Lennie Odor, [address] [place],  [place] [address]  No chief complaint on file.   HPI:    PMH: Past Medical History:  Diagnosis Date   Abnormal Pap smear of cervix 12/2014   Acid reflux    Adrenal insufficiency (HCC)    Anxiety    Arthritis    Arthritis    Depression    Depression    Endometriosis    Fibroids    age 47   Fibromyalgia    High cholesterol    History of kidney stones    Liver disease    per pt report, will have biopsy Nov 08, 2017   Migraines    Pleurisy 02/2015   while hospitalized    Seizure ([place])    on meds and no seizures in 2-3 years. Unknown etiology   Skin cancer (melanoma) ([place])    Syncope and collapse    Uterine cancer [facility])     Surgical History: Past Surgical History:  Procedure Laterality Date   ABDOMINAL HYSTERECTOMY     ABLATION     uterine   COLECTOMY     CYSTOSCOPY W/ URETERAL STENT PLACEMENT Left 02/02/2015   Procedure: CYSTOSCOPY WITH RETROGRADE PYELOGRAM/URETERAL STENT PLACEMENT;  Surgeon: Festus Aloe, MD;  Location: [facility];  Service: Urology;  Laterality: Left;   CYSTOSCOPY WITH RETROGRADE PYELOGRAM, URETEROSCOPY AND STENT PLACEMENT Bilateral 01/20/2022   Procedure: CYSTOSCOPY WITH RETROGRADE PYELOGRAM, URETEROSCOPY AND STENT PLACEMENT;  Surgeon: Cleon Gustin, MD;  Location: [facility];  Service: Urology;  Laterality: Bilateral;   CYSTOSCOPY WITH RETROGRADE PYELOGRAM, URETEROSCOPY AND STENT PLACEMENT Left 11/03/2022   Procedure: CYSTOSCOPY WITH RETROGRADE PYELOGRAM, URETEROSCOPY AND STENT PLACEMENT;  Surgeon: Cleon Gustin, MD;  Location: [facility];  Service: Urology;  Laterality: Left;  pt knows to arrive at 9:00   [place] Left 02/13/2015   Procedure: LEFT URETEROSCOPY WITH HOLMIUM LASER AND STENT PLACEMENT;  Surgeon: Festus Aloe, MD;  [facility]: [facility];  Service:  Urology;  Laterality: Left;   DILATION AND CURETTAGE OF UTERUS     ESOPHAGOGASTRODUODENOSCOPY ENDOSCOPY  10/11/2017   @ [facility]; BIOPSY OF LOWER INTESTINE   EXTRACORPOREAL SHOCK WAVE LITHOTRIPSY Left 09/13/2022   Procedure: EXTRACORPOREAL SHOCK WAVE LITHOTRIPSY (ESWL);  Surgeon: Cleon Gustin, MD;  Location: [facility];  Service: Urology;  Laterality: Left;   HOLMIUM LASER APPLICATION N/A [id number]   Procedure: HOLMIUM LASER APPLICATION;  Surgeon: Festus Aloe, MD;  [facility];  Service: Urology;  Laterality: N/A;   IR NEPHROSTOMY PLACEMENT RIGHT  07/05/2021   KNEE SURGERY Bilateral 2019   realignment of knees   LAPAROSCOPIC GASTRIC SLEEVE RESECTION     LAPAROSCOPY     x 4   MELANOMA EXCISION  2000   melanoma removed from face, basal cell removed from nose   NEPHROLITHOTOMY Right 07/08/2021   Procedure: NEPHROLITHOTOMY PERCUTANEOUS- with stent placement;  Surgeon: Cleon Gustin, MD;  [facility]: [facility];  Service: Urology;  Laterality: Right;   REPAIR VAGINAL CUFF N/A 08/21/2015   Procedure: REPAIR VAGINAL CUFF, EXAM UNDER ANESTHESIA;  Surgeon: Everitt Amber, MD;  [facility];  Service: Gynecology;  Laterality: N/A;   ROBOTIC ASSISTED TOTAL HYSTERECTOMY WITH BILATERAL SALPINGO OOPHERECTOMY Bilateral 05/21/2015   Procedure: XI ROBOTIC ASSISTED TOTAL HYSTERECTOMY WITH BILATERAL SALPINGO OOPHORECTOMY;  Surgeon: Everitt Amber, MD;  Location: [facility];  Service: Gynecology;  Laterality: Bilateral;  STONE EXTRACTION WITH BASKET Bilateral 01/20/2022   Procedure: STONE EXTRACTION WITH BASKET;  Surgeon: Cleon Gustin, MD;  [facility];  Service: Urology;  Laterality: Bilateral;   STONE EXTRACTION WITH BASKET Left 11/03/2022   Procedure: STONE EXTRACTION WITH BASKET;  Surgeon: Cleon Gustin, MD;  Location: [facility];  Service: Urology;  Laterality: Left;    Home Medications:  Allergies as of 12/12/2022       Reactions   Penicillins Swelling, Rash   Rash all over [place] and  lip swelling. Has patient had a PCN reaction causing immediate rash, facial/tongue/throat swelling, SOB or lightheadedness with hypotension: Yes Has patient had a PCN reaction causing severe rash involving mucus membranes or skin necrosis: Yes Has patient had a PCN reaction that required hospitalization: No If all of the above answers are "NO", then may proceed with Cephalosporin use.   Nortriptyline Other (See Comments)   Severe chest pain    Nsaids Other (See Comments)   Advised not to take due to gastric sleeve    Amoxicillin Rash   Rash all over [place]   Ciprofloxacin Diarrhea, Nausea Only        Medication List        Accurate as of December 12, 2022 10:12 AM. If you have any questions, ask your nurse or doctor.          STOP taking these medications    fluconazole 150 MG tablet Commonly known as: Diflucan   HYDROcodone-acetaminophen 10-325 MG tablet Commonly known as: Norco   HYDROmorphone 2 MG tablet Commonly known as: DILAUDID   nitrofurantoin 50 MG capsule Commonly known as: MACRODANTIN   oxyCODONE 5 MG immediate release tablet Commonly known as: Roxicodone   oxyCODONE-acetaminophen 5-325 MG tablet Commonly known as: Percocet       TAKE these medications    acetaminophen 500 MG tablet Commonly known as: TYLENOL Take 1,000 mg by mouth every 6 (six) hours as needed for moderate pain.   albuterol 108 (90 Base) MCG/ACT inhaler Commonly known as: VENTOLIN HFA Inhale 1-2 puffs into the lungs every 6 (six) hours as needed for shortness of breath or wheezing.   allopurinol 300 MG tablet Commonly known as: ZYLOPRIM TAKE 1 TABLET EVERY DAY What changed: when to take this   baclofen 10 MG tablet Commonly known as: LIORESAL Take 10 mg by mouth every 8 (eight) hours.   celecoxib 200 MG capsule Commonly known as: CELEBREX Take 200 mg by mouth in the morning.   citalopram 10 MG tablet Commonly known as: CELEXA Take 30 mg by mouth at bedtime.    clonazePAM 1 MG tablet Commonly known as: KLONOPIN Take 1 mg by mouth at bedtime.   diphenhydrAMINE 25 MG tablet Commonly known as: BENADRYL Take 25 mg by mouth every 6 (six) hours as needed (take with for migraines).   DULoxetine 60 MG capsule Commonly known as: CYMBALTA Take 60 mg by mouth at bedtime.   DULoxetine 30 MG capsule Commonly known as: CYMBALTA Take 30 mg by mouth in the morning.   ezetimibe 10 MG tablet Commonly known as: ZETIA Take 10 mg by mouth in the morning.   gabapentin 800 MG tablet Commonly known as: NEURONTIN Take 2 tablets (1,600 mg total) by mouth 2 (two) times daily.   Gas Relief 250 MG Caps Generic drug: Simethicone Take 250 mg by mouth 3 (three) times daily as needed (gas/bloating).   lamoTRIgine 200 MG tablet Commonly known as: LAMICTAL Take 200 mg by mouth at  bedtime.   methylphenidate 5 MG tablet Commonly known as: RITALIN Take 5 mg by mouth at bedtime.   mirabegron ER 25 MG Tb24 tablet Commonly known as: MYRBETRIQ Take 1 tablet (25 mg total) by mouth daily. What changed: when to take this   multivitamin with minerals Tabs tablet Take 1 tablet by mouth in the morning. One A Day   nitrofurantoin (macrocrystal-monohydrate) 100 MG capsule Commonly known as: MACROBID Take 1 capsule (100 mg total) by mouth every 12 (twelve) hours.   omeprazole 40 MG capsule Commonly known as: PRILOSEC Take 40 mg by mouth 2 (two) times daily.   ondansetron 4 MG tablet Commonly known as: Zofran Take 1 tablet (4 mg total) by mouth daily as needed for nausea or vomiting.   phenazopyridine 200 MG tablet Commonly known as: PYRIDIUM TAKE 1 TABLET(200 MG) BY MOUTH THREE TIMES DAILY AS NEEDED FOR PAIN   prochlorperazine 10 MG tablet Commonly known as: COMPAZINE Take 10 mg by mouth every 6 (six) hours as needed (migraines (nausea)).   promethazine 25 MG tablet Commonly known as: PHENERGAN Take 1 tablet (25 mg total) by mouth 4 (four) times daily as  needed for nausea or vomiting.   rosuvastatin 5 MG tablet Commonly known as: CRESTOR Take 5 mg by mouth at bedtime.   sodium bicarbonate 650 MG tablet Take 1 tablet (650 mg total) by mouth 2 (two) times daily.   sulfamethoxazole-trimethoprim 800-160 MG tablet Commonly known as: BACTRIM DS Take 1 tablet by mouth 2 (two) times daily.   SYSTANE OP Place 1 drop into both eyes in the morning, at noon, and at bedtime.   tamsulosin 0.4 MG Caps capsule Commonly known as: FLOMAX TAKE 1 CAPSULE EVERY DAY AFTER SUPPER   tiZANidine 2 MG tablet Commonly known as: ZANAFLEX Take 2 mg by mouth every 8 (eight) hours.        Allergies:  Allergies  Allergen Reactions   Penicillins Swelling and Rash    Rash all over [place] and lip swelling. Has patient had a PCN reaction causing immediate rash, facial/tongue/throat swelling, SOB or lightheadedness with hypotension: Yes Has patient had a PCN reaction causing severe rash involving mucus membranes or skin necrosis: Yes Has patient had a PCN reaction that required hospitalization: No If all of the above answers are "NO", then may proceed with Cephalosporin use.   Nortriptyline Other (See Comments)    Severe chest pain     Nsaids Other (See Comments)    Advised not to take due to gastric sleeve    Amoxicillin Rash    Rash all over [place]    Ciprofloxacin Diarrhea and Nausea Only    Family History: Family History  Problem Relation Age of Onset   Hypertension Mother    Melanoma Mother    Breast cancer Mother 1   Cancer - Other Father        liver   Diabetes Mellitus II Maternal Grandmother    Heart disease Maternal Grandmother    Breast cancer Maternal Grandmother    Cancer - Lung Maternal Grandfather    Osteoporosis Paternal Grandmother    Melanoma Paternal Grandfather     Social History:  reports that she has never smoked. She has never used smokeless tobacco. She reports that she does not drink alcohol and does not use  drugs.  ROS: All other review of systems were reviewed and are negative except what is noted above in HPI  Physical Exam: BP 108/74   Pulse 93  Ht 6' (1.829 m)   Wt 223 lb (101.2 kg)   LMP 05/04/2015 (Approximate)   BMI 30.24 kg/m   Constitutional:  Alert and oriented, No acute distress. HEENT: [place] AT, moist mucus membranes.  Trachea midline, no masses. Cardiovascular: No clubbing, cyanosis, or edema. Respiratory: Normal respiratory effort, no increased work of breathing. GI: Abdomen is soft, nontender, nondistended, no abdominal masses GU: No CVA tenderness.  Lymph: No cervical or inguinal lymphadenopathy. Skin: No rashes, bruises or suspicious lesions. Neurologic: Grossly intact, no focal deficits, moving all 4 extremities. Psychiatric: Normal mood and affect.  Laboratory Data: Lab Results  Component Value Date   WBC 11.0 (H) 07/09/2021   HGB 11.6 (L) 07/09/2021   HCT 35.1 (L) 07/09/2021   MCV 94.1 07/09/2021   PLT 167 07/09/2021    Lab Results  Component Value Date   CREATININE 0.67 01/17/2022    No results found for: "PSA"  No results found for: "TESTOSTERONE"  Lab Results  Component Value Date   HGBA1C 5.3 10/13/2021    Urinalysis    Component Value Date/Time   COLORURINE YELLOW (A) 06/21/2017 1735   APPEARANCEUR Hazy (A) 11/16/2022 1145   LABSPEC 1.019 06/21/2017 1735   PHURINE 5.0 06/21/2017 1735   GLUCOSEU Trace (A) 11/16/2022 1145   HGBUR NEGATIVE 06/21/2017 1735   BILIRUBINUR Negative 11/16/2022 1145   KETONESUR NEGATIVE 06/21/2017 1735   PROTEINUR 1+ (A) 11/16/2022 1145   PROTEINUR NEGATIVE 06/21/2017 1735   UROBILINOGEN 0.2 01/27/2020 1124   UROBILINOGEN 1.0 05/18/2015 1359   NITRITE Negative 11/16/2022 1145   NITRITE NEGATIVE 06/21/2017 1735   LEUKOCYTESUR Negative 11/16/2022 1145    Lab Results  Component Value Date   LABMICR See below: 11/16/2022   WBCUA 0-5 11/16/2022   LABEPIT 0-10 11/16/2022   MUCUS Present (A) 11/16/2022    BACTERIA Few 11/16/2022    Pertinent Imaging: *** Results for orders placed during the hospital encounter of 10/14/22  DG Abd 1 View  Narrative CLINICAL DATA:  Status post ESWL  EXAM: ABDOMEN - 1 VIEW  COMPARISON:  09/21/2022  FINDINGS: The bowel gas pattern is normal. 6 mm calcification overlying the lower pole left kidney. No additional stones identified. Noncontrast CT recommended if there is a clinical desire to assess for stones more definitively.  IMPRESSION: Possible left-sided stone.  Unremarkable bowel gas pattern.   Electronically Signed By: Sammie Bench M.D. On: 10/16/2022 01:21  No results found for this or any previous visit.  No results found for this or any previous visit.  No results found for this or any previous visit.  Results for orders placed during the hospital encounter of 12/09/22  Ultrasound renal complete  Narrative CLINICAL DATA:  Nephrolithiasis.  EXAM: RENAL / URINARY TRACT ULTRASOUND COMPLETE  COMPARISON:  Ultrasound October 07, 2022  FINDINGS: Right Kidney:  Renal measurements: 10.4 x 6.0 x 5.9 cm = volume: 191 mL. Echogenicity within normal limits. No mass or hydronephrosis visualized.  Left Kidney:  Renal measurements: 12.8 x 6.5 x 5.2 cm = volume: 228 mL. Contains a 4.3 mm stone. No hydronephrosis.  Bladder:  Appears normal for degree of bladder distention.  Other:  None.  IMPRESSION: 4.3 mm stone in the left kidney. No other abnormalities.   Electronically Signed By: Dorise Bullion III M.D. On: 12/09/2022 13:50  No valid procedures specified. No results found for this or any previous visit.  Results for orders placed during the hospital encounter of 10/13/21  CT RENAL STONE STUDY  Narrative  CLINICAL DATA:  Right-sided flank pain and hematuria for 1 month. Nephrolithiasis.  EXAM: CT ABDOMEN AND PELVIS WITHOUT CONTRAST  TECHNIQUE: Multidetector CT imaging of the abdomen and pelvis was  performed following the standard protocol without IV contrast.  RADIATION DOSE REDUCTION: This exam was performed according to the departmental dose-optimization program which includes automated exposure control, adjustment of the mA and/or kV according to patient size and/or use of iterative reconstruction technique.  COMPARISON:  01/01/2020  FINDINGS: Lower chest: No acute findings.  Hepatobiliary: No mass visualized on this unenhanced exam. Gallbladder is unremarkable. No evidence of biliary ductal dilatation.  Pancreas: No mass or inflammatory process visualized on this unenhanced exam.  Spleen:  Within normal limits in size.  Adrenals/Urinary tract: Several tiny less than 5 mm renal calculi are seen bilaterally. No evidence of ureteral calculi or hydronephrosis. Unremarkable unopacified urinary bladder.  Stomach/Bowel: Postop changes again seen from previous subtotal colectomy and sleeve gastrectomy. No evidence of obstruction, inflammatory process, or abnormal fluid collections.  Vascular/Lymphatic: 14 x 11 mm mesenteric soft tissue nodule or lymph node is again seen in the right abdominal mesentery on image 64/2, stable since prior exam. No other pathologically enlarged lymph nodes identified. No evidence of abdominal aortic aneurysm.  Reproductive: Prior hysterectomy noted. Adnexal regions are unremarkable in appearance.  Other:  None.  Musculoskeletal: No suspicious bone lesions identified. Stable small sclerotic bone lesion in L4 vertebral body compared to previous CT in 2016, consistent with benign etiology.  IMPRESSION: Tiny bilateral renal calculi. No evidence of ureteral calculi, hydronephrosis, or other acute findings.  Stable small right abdominal mesenteric lymph node or soft tissue nodule. Recommend continued follow-up by CT in 6-12 months.   Electronically Signed By: Marlaine Hind M.D. On: 10/13/2021 12:38   Assessment & Plan:    1. Kidney  stones *** - Urinalysis, Routine w reflex microscopic   No follow-ups on file.  Nicolette Bang, MD  [facility] [place]

## 2022-12-13 ENCOUNTER — Encounter: Payer: Self-pay | Admitting: Urology

## 2022-12-13 NOTE — Patient Instructions (Signed)

## 2022-12-19 ENCOUNTER — Other Ambulatory Visit: Payer: Self-pay | Admitting: Urology

## 2022-12-20 DIAGNOSIS — G43011 Migraine without aura, intractable, with status migrainosus: Secondary | ICD-10-CM | POA: Diagnosis not present

## 2022-12-20 DIAGNOSIS — R292 Abnormal reflex: Secondary | ICD-10-CM | POA: Diagnosis not present

## 2022-12-20 DIAGNOSIS — R569 Unspecified convulsions: Secondary | ICD-10-CM | POA: Diagnosis not present

## 2023-01-13 DIAGNOSIS — Z85828 Personal history of other malignant neoplasm of skin: Secondary | ICD-10-CM | POA: Diagnosis not present

## 2023-01-13 DIAGNOSIS — Z1283 Encounter for screening for malignant neoplasm of skin: Secondary | ICD-10-CM | POA: Diagnosis not present

## 2023-01-13 DIAGNOSIS — D239 Other benign neoplasm of skin, unspecified: Secondary | ICD-10-CM | POA: Diagnosis not present

## 2023-01-13 DIAGNOSIS — L719 Rosacea, unspecified: Secondary | ICD-10-CM | POA: Diagnosis not present

## 2023-01-13 DIAGNOSIS — L821 Other seborrheic keratosis: Secondary | ICD-10-CM | POA: Diagnosis not present

## 2023-01-13 DIAGNOSIS — D489 Neoplasm of uncertain behavior, unspecified: Secondary | ICD-10-CM | POA: Diagnosis not present

## 2023-01-13 DIAGNOSIS — D229 Melanocytic nevi, unspecified: Secondary | ICD-10-CM | POA: Diagnosis not present

## 2023-01-13 DIAGNOSIS — D492 Neoplasm of unspecified behavior of bone, soft tissue, and skin: Secondary | ICD-10-CM | POA: Diagnosis not present

## 2023-01-13 DIAGNOSIS — L309 Dermatitis, unspecified: Secondary | ICD-10-CM | POA: Diagnosis not present

## 2023-01-13 DIAGNOSIS — L578 Other skin changes due to chronic exposure to nonionizing radiation: Secondary | ICD-10-CM | POA: Diagnosis not present

## 2023-01-17 DIAGNOSIS — M797 Fibromyalgia: Secondary | ICD-10-CM | POA: Diagnosis not present

## 2023-01-17 DIAGNOSIS — M5441 Lumbago with sciatica, right side: Secondary | ICD-10-CM | POA: Diagnosis not present

## 2023-01-17 DIAGNOSIS — R0789 Other chest pain: Secondary | ICD-10-CM | POA: Diagnosis not present

## 2023-01-17 DIAGNOSIS — M5412 Radiculopathy, cervical region: Secondary | ICD-10-CM | POA: Diagnosis not present

## 2023-01-17 DIAGNOSIS — M533 Sacrococcygeal disorders, not elsewhere classified: Secondary | ICD-10-CM | POA: Diagnosis not present

## 2023-01-17 DIAGNOSIS — M79644 Pain in right finger(s): Secondary | ICD-10-CM | POA: Diagnosis not present

## 2023-01-17 DIAGNOSIS — G894 Chronic pain syndrome: Secondary | ICD-10-CM | POA: Diagnosis not present

## 2023-01-18 DIAGNOSIS — F330 Major depressive disorder, recurrent, mild: Secondary | ICD-10-CM | POA: Diagnosis not present

## 2023-01-18 DIAGNOSIS — G2581 Restless legs syndrome: Secondary | ICD-10-CM | POA: Diagnosis not present

## 2023-01-18 DIAGNOSIS — F4310 Post-traumatic stress disorder, unspecified: Secondary | ICD-10-CM | POA: Diagnosis not present

## 2023-01-25 DIAGNOSIS — R292 Abnormal reflex: Secondary | ICD-10-CM | POA: Diagnosis not present

## 2023-01-25 DIAGNOSIS — M50222 Other cervical disc displacement at C5-C6 level: Secondary | ICD-10-CM | POA: Diagnosis not present

## 2023-02-23 DIAGNOSIS — Z859 Personal history of malignant neoplasm, unspecified: Secondary | ICD-10-CM | POA: Diagnosis not present

## 2023-02-23 DIAGNOSIS — D229 Melanocytic nevi, unspecified: Secondary | ICD-10-CM | POA: Diagnosis not present

## 2023-02-23 DIAGNOSIS — D1801 Hemangioma of skin and subcutaneous tissue: Secondary | ICD-10-CM | POA: Diagnosis not present

## 2023-02-23 DIAGNOSIS — D1809 Hemangioma of other sites: Secondary | ICD-10-CM | POA: Diagnosis not present

## 2023-02-23 DIAGNOSIS — L820 Inflamed seborrheic keratosis: Secondary | ICD-10-CM | POA: Diagnosis not present

## 2023-02-28 ENCOUNTER — Other Ambulatory Visit: Payer: Self-pay | Admitting: Urology

## 2023-02-28 DIAGNOSIS — N200 Calculus of kidney: Secondary | ICD-10-CM | POA: Diagnosis not present

## 2023-03-01 ENCOUNTER — Encounter: Payer: Self-pay | Admitting: Podiatry

## 2023-03-01 ENCOUNTER — Ambulatory Visit (INDEPENDENT_AMBULATORY_CARE_PROVIDER_SITE_OTHER): Payer: Medicare HMO

## 2023-03-01 ENCOUNTER — Ambulatory Visit (INDEPENDENT_AMBULATORY_CARE_PROVIDER_SITE_OTHER): Payer: Medicare HMO | Admitting: Podiatry

## 2023-03-01 DIAGNOSIS — M7662 Achilles tendinitis, left leg: Secondary | ICD-10-CM | POA: Diagnosis not present

## 2023-03-01 MED ORDER — METHYLPREDNISOLONE 4 MG PO TBPK: ORAL_TABLET | ORAL | 0 refills | Status: DC

## 2023-03-01 NOTE — Progress Notes (Signed)
  Subjective:  Patient ID: Denise Avila, female    DOB: [birth year],   MRN: [id number]  Chief Complaint  Patient presents with   Foot Pain    RM 24 Left heel pain x 3 weeks. Pain is sharp with edema located at the the lateral and back of heel.     47 y.o. female presents for concern as above. Denies any injury. Relates no treatments. Does have a history of tibial nerve entrapment and had surgery for this but relates that nerve pain has been different than this.  . Denies any other pedal complaints. Denies n/v/f/c.   Past Medical History:  Diagnosis Date   Abnormal Pap smear of cervix 12/2014   Acid reflux    Adrenal insufficiency (HCC)    Anxiety    Arthritis    Arthritis    Depression    Depression    Endometriosis    Fibroids    age 29   Fibromyalgia    High cholesterol    History of kidney stones    Liver disease    per pt report, will have biopsy Nov 08, 2017   Migraines    Pleurisy 02/2015   while hospitalized    Seizure (HCC)    on meds and no seizures in 2-3 years. Unknown etiology   Skin cancer (melanoma) (HCC)    Syncope and collapse    Uterine cancer (HCC)     Objective:  Physical Exam: Vascular: DP/PT pulses 2/4 bilateral. CFT <3 seconds. Normal hair growth on digits. No edema.  Skin. No lacerations or abrasions bilateral feet.  Musculoskeletal: MMT 5/5 bilateral lower extremities in DF, PF, Inversion and Eversion. Deceased ROM in DF of ankle joint. Tender to insertion of left achilles tendon. Pain with DF and PF of the ankle as well. Extreme sensitivity to area but no pain proximally along the tendon in watershed area.  Neurological: Sensation intact to light touch.   Assessment:   1. Tendonitis, Achilles, left      Plan:  Patient was evaluated and treated and all questions answered. -Xrays reviewed. Spurring noted to posterior calcaneus.  Reviewed previous notes and NCV studies -Discussed Achilles insertional tendonitis and treatment options with  patient.  -Discussed stretching exercises. -Rx Medrol dose pack provided.  -Heel lifts provided and discussed proper shoewear.  -Discussed if no improvement will consider MRI/PT/EPAT/PRP injections.  -Patient to return to office in 6 weeks for recheck   Louann Sjogren, DPM

## 2023-03-01 NOTE — Patient Instructions (Signed)

## 2023-03-08 ENCOUNTER — Other Ambulatory Visit: Payer: Self-pay

## 2023-03-08 DIAGNOSIS — N200 Calculus of kidney: Secondary | ICD-10-CM

## 2023-03-08 MED ORDER — SODIUM BICARBONATE 650 MG PO TABS
650.0000 mg | ORAL_TABLET | Freq: Two times a day (BID) | ORAL | 11 refills | Status: DC
Start: 2023-03-08 — End: 2024-02-29

## 2023-03-13 ENCOUNTER — Ambulatory Visit (HOSPITAL_COMMUNITY)
Admission: RE | Admit: 2023-03-13 | Discharge: 2023-03-13 | Disposition: A | Discharge status: Home / Self Care | Payer: Medicare HMO | Source: Ambulatory Visit | Attending: Urology | Admitting: Urology

## 2023-03-13 DIAGNOSIS — N200 Calculus of kidney: Secondary | ICD-10-CM | POA: Diagnosis not present

## 2023-03-15 ENCOUNTER — Ambulatory Visit: Payer: Medicare HMO | Admitting: Urology

## 2023-03-16 DIAGNOSIS — F4310 Post-traumatic stress disorder, unspecified: Secondary | ICD-10-CM | POA: Diagnosis not present

## 2023-03-16 DIAGNOSIS — R569 Unspecified convulsions: Secondary | ICD-10-CM | POA: Diagnosis not present

## 2023-03-16 DIAGNOSIS — F39 Unspecified mood [affective] disorder: Secondary | ICD-10-CM | POA: Diagnosis not present

## 2023-03-16 DIAGNOSIS — F4001 Agoraphobia with panic disorder: Secondary | ICD-10-CM | POA: Diagnosis not present

## 2023-03-17 DIAGNOSIS — F39 Unspecified mood [affective] disorder: Secondary | ICD-10-CM | POA: Diagnosis not present

## 2023-03-17 DIAGNOSIS — G43011 Migraine without aura, intractable, with status migrainosus: Secondary | ICD-10-CM | POA: Diagnosis not present

## 2023-03-17 DIAGNOSIS — M542 Cervicalgia: Secondary | ICD-10-CM | POA: Diagnosis not present

## 2023-03-17 DIAGNOSIS — Z87442 Personal history of urinary calculi: Secondary | ICD-10-CM | POA: Diagnosis not present

## 2023-03-17 DIAGNOSIS — Z79899 Other long term (current) drug therapy: Secondary | ICD-10-CM | POA: Diagnosis not present

## 2023-03-22 ENCOUNTER — Ambulatory Visit: Payer: Medicare HMO | Admitting: Urology

## 2023-03-22 DIAGNOSIS — N200 Calculus of kidney: Secondary | ICD-10-CM

## 2023-03-23 DIAGNOSIS — M7918 Myalgia, other site: Secondary | ICD-10-CM | POA: Diagnosis not present

## 2023-03-23 DIAGNOSIS — G43011 Migraine without aura, intractable, with status migrainosus: Secondary | ICD-10-CM | POA: Diagnosis not present

## 2023-03-26 LAB — LITHOLINK 24HR URINE PANEL
Ammonium, Urine: 26 mmol/24 hr (ref 15–60)
Calcium Oxalate Saturation: 13.41 — ABNORMAL HIGH (ref 6.00–10.00)
Calcium Phosphate Saturation: 0.51 (ref 0.50–2.00)
Calcium, Urine: 207 mg/24 hr — ABNORMAL HIGH (ref <200)
Calcium/Creatinine Ratio: 173 mg/g creat (ref 51–262)
Chloride, Urine: 119 mmol/24 hr (ref 70–250)
Citrate, Urine: 410 mg/24 hr — ABNORMAL LOW (ref >550)
Creatinine, Urine: 1199 mg/24 hr
Magnesium, Urine: 167 mg/24 hr — ABNORMAL HIGH (ref 30–120)
Oxalate, Urine: 58 mg/24 hr — ABNORMAL HIGH (ref 20–40)
Phosphorus, Urine: 877 mg/24 hr (ref 600–1200)
Potassium, Urine: 61 mmol/24 hr (ref 20–100)
Sodium, Urine: 116 mmol/24 hr (ref 50–150)
Sulfate, Urine: <4 meq/24 hr — ABNORMAL LOW (ref 20–80)
Urea Nitrogen, Urine: 5.51 g/24 hr — ABNORMAL LOW (ref 6.00–14.00)
Uric Acid Saturation: 1.29 — ABNORMAL HIGH (ref <1.00)
Uric Acid, Urine: 388 mg/24 hr (ref <750)
Urine Volume (Preserved): 1490 mL/24 hr (ref 500–4000)
pH, 24 hr, Urine: 5.44 — ABNORMAL LOW (ref 5.800–6.200)

## 2023-03-26 LAB — SPECIMEN STATUS REPORT

## 2023-04-09 ENCOUNTER — Other Ambulatory Visit: Payer: Self-pay | Admitting: Urology

## 2023-04-10 MED ORDER — FLUCONAZOLE 100 MG PO TABS: 100.0000 mg | ORAL_TABLET | Freq: Every day | ORAL | 0 refills | Status: DC

## 2023-04-11 ENCOUNTER — Ambulatory Visit: Payer: Medicare HMO | Admitting: Urology

## 2023-04-11 DIAGNOSIS — G43011 Migraine without aura, intractable, with status migrainosus: Secondary | ICD-10-CM | POA: Diagnosis not present

## 2023-04-12 DIAGNOSIS — Z8639 Personal history of other endocrine, nutritional and metabolic disease: Secondary | ICD-10-CM | POA: Diagnosis not present

## 2023-04-12 DIAGNOSIS — K760 Fatty (change of) liver, not elsewhere classified: Secondary | ICD-10-CM | POA: Diagnosis not present

## 2023-04-12 DIAGNOSIS — R7303 Prediabetes: Secondary | ICD-10-CM | POA: Diagnosis not present

## 2023-04-12 DIAGNOSIS — G629 Polyneuropathy, unspecified: Secondary | ICD-10-CM | POA: Diagnosis not present

## 2023-04-17 ENCOUNTER — Ambulatory Visit: Payer: Medicare HMO | Admitting: Podiatry

## 2023-04-19 DIAGNOSIS — R202 Paresthesia of skin: Secondary | ICD-10-CM | POA: Diagnosis not present

## 2023-04-19 DIAGNOSIS — Z8639 Personal history of other endocrine, nutritional and metabolic disease: Secondary | ICD-10-CM | POA: Diagnosis not present

## 2023-04-19 DIAGNOSIS — F39 Unspecified mood [affective] disorder: Secondary | ICD-10-CM | POA: Diagnosis not present

## 2023-04-19 DIAGNOSIS — F4310 Post-traumatic stress disorder, unspecified: Secondary | ICD-10-CM | POA: Diagnosis not present

## 2023-04-19 DIAGNOSIS — F4001 Agoraphobia with panic disorder: Secondary | ICD-10-CM | POA: Diagnosis not present

## 2023-04-19 DIAGNOSIS — R200 Anesthesia of skin: Secondary | ICD-10-CM | POA: Diagnosis not present

## 2023-05-01 ENCOUNTER — Ambulatory Visit: Payer: Medicare HMO | Admitting: Urology

## 2023-05-01 ENCOUNTER — Encounter: Payer: Self-pay | Admitting: Urology

## 2023-05-01 VITALS — BP 102/73 | HR 101 | Temp 99.2°F

## 2023-05-01 DIAGNOSIS — R3915 Urgency of urination: Secondary | ICD-10-CM

## 2023-05-01 DIAGNOSIS — R1032 Left lower quadrant pain: Secondary | ICD-10-CM

## 2023-05-01 DIAGNOSIS — N200 Calculus of kidney: Secondary | ICD-10-CM

## 2023-05-01 DIAGNOSIS — R350 Frequency of micturition: Secondary | ICD-10-CM

## 2023-05-01 DIAGNOSIS — N3281 Overactive bladder: Secondary | ICD-10-CM | POA: Diagnosis not present

## 2023-05-01 DIAGNOSIS — Z87440 Personal history of urinary (tract) infections: Secondary | ICD-10-CM

## 2023-05-01 DIAGNOSIS — K529 Noninfective gastroenteritis and colitis, unspecified: Secondary | ICD-10-CM | POA: Insufficient documentation

## 2023-05-01 DIAGNOSIS — Z9884 Bariatric surgery status: Secondary | ICD-10-CM | POA: Insufficient documentation

## 2023-05-01 LAB — URINALYSIS, ROUTINE W REFLEX MICROSCOPIC
Bilirubin, UA: NEGATIVE
Glucose, UA: NEGATIVE
Ketones, UA: NEGATIVE
Leukocytes,UA: NEGATIVE
Nitrite, UA: NEGATIVE
Protein,UA: NEGATIVE
RBC, UA: NEGATIVE
Specific Gravity, UA: 1.015 (ref 1.005–1.030)
Urobilinogen, Ur: 0.2 mg/dL (ref 0.2–1.0)
pH, UA: 7 (ref 5.0–7.5)

## 2023-05-01 MED ORDER — MIRABEGRON ER 25 MG PO TB24
50.0000 mg | ORAL_TABLET | Freq: Every day | ORAL | 3 refills | Status: DC
Start: 2023-05-01 — End: 2023-07-10

## 2023-05-01 NOTE — Patient Instructions (Addendum)
Hyperoxaluria  What it is: Too much oxalate in the urine (> 45 mg/day) Why it matters: Increases risk for calcium oxalate stones Potential causes / contributing factors:  High intake of oxalate-rich foods  Very low calcium intake How to manage:   Dietary changes:  Low oxalate diet Increase fluid intake Increase calcium intake (to bind with oxalate in gut) Supplement use:  Beelith (magnesium oxide + vitamin B6 (pyridoxine)) Mechanism of action: Helps inhibit crystal formation and reduce hyperoxaluria Magnesium supplement is preferred over magnesium-rich diet because magnesium-rich foods tend to be high in oxalate   Hyperuricosuria What it is: Too much uric acid in the urine (> 600 mg/day) Why it matters: Increases risk for calcium oxalate stones and uric acid stones Potential causes / contributing factors:  Obesity High animal protein intake Gout Hypertriglyceridemia How to manage:  Dietary changes:  Limit animal protein to 6-8 oz daily Possible medications: Potassium Citrate (Urocit-K or Polycitra-K) Allopurinol   Hypocitraturia  What it is: Too little citrate in the urine (< 500 mg/day) Why it matters: Increases risk for calcium oxalate stones Potential causes / contributing factors: Renal tubular acidosis (RTA) Chronic diarrheal syndrome Potassium-wasting diuretic medication (like thiazide diuretics) Topiramate use Diabetes High intake of animal protein and salt How to manage:  Dietary changes:  Increase citrus fruit intake Decrease animal protein Decrease sodium intake Possible medications: Potassium Citrate (Urocit-K or Polycitra-K)         >80% of stones are calcium oxalate. This type of stones forms when body either isn't clearing oxalate well enough, is making too much oxalate, or too little citrate. This results in oxalate binding to form crystals, which continue to aggregate and form stones.  Limiting calcium does not help, but limiting oxalate in  the diet can help. Increasing citric acid intake may also help.  The following measures may help to prevent the recurrence of stones: Increase water intake to 2-2.5 liters per day May add citrus juice (lemon, lime or orange juice) to water Moderation in dairy foods Decrease in salt content Low Oxalate diet: Oxylates are found in foods like Tomato, Spinach, red wine and chocolate (see additional resources below).  Internet resources for information regarding low oxalate diet:  https://kidneystones.yangchunwu.com https://my.VerticalStretch.be Foods Low in Sodium or Oxalate Foods You Can Eat  Drinks Coffee, fruit and veggie juice (using the recommended veggies), fruit punch  Fruits Apples, apricots (fresh or canned), avocado, bananas, cherries (sweet), cranberries, grapefruit, red or green grapes, lemon and lime juice, melons, nectarines, papayas, peaches, pears, pineapples, oranges, strawberries (fresh), tangerines  Veggies Artichokes, asparagus, bamboo shoots, broccoli, brussels sprouts, cabbage, cauliflower, chayote squash, chicory, corn, cucumbers, endive, lettuce, lima beans, mushrooms, onions, peas, peppers, potatoes, radishes, rutabagas, zucchini  Breads, Cereals, Grains Egg noodles, rye bread, cooked and dry cereals without nuts or bran, crackers with unsalted tops, white or wild rice  Meat, Meat Replacements, Fish, Recruitment consultant, fish, poultry, eggs, egg whites, egg replacements  Soup Homemade soup (using the recommended veggies and meat), low-sodium bouillon, low-sodium canned  Desserts Cookies, cakes, ice cream, pudding without chocolate or nuts, candy without chocolate or nuts  Fats and Oils Butter, margarine, cream, oil, salad dressing, mayo  Other Foods Unsalted potato chips or pretzels, herbs (like garlic, garlic powder, onion powder), lemon juice, salt-free seasoning blends, vinegar   Other Foods Low in Oxalate Foods You Can Eat  Drinks Beer, cola, wine, buttermilk, lemonade or limeade (without added vitamin C), milk  Meat, Meat Replacements, Fish, [employer], ham, bacon, hot  dogs, bratwurst, sausage, chicken nuggets, cheddar cheese, canned fish and shellfish  Soup Tomato soup, cheese soup  Other Foods Coconuts, lemon or lime juices, sugar or sweeteners, jellies or jams (from the recommended list)   Moderate-Oxalate Foods Foods to Limit   Drinks Fruit and veggie juices (from the list below), chocolate milk, rice milk, hot cocoa, tea   Fruits Blackberries, blueberries, black currants, cherries (sour), fruit cocktail, mangoes, orange peel, prunes, purple plums   Veggies Baked beans, carrots, celery, green beans, parsnips, summer squash, tomatoes, turnips   Breads, Cereals, Grains White bread, cornbread or cornmeal, white English muffins, saltine or soda crackers, brown rice, vanilla wafers, spaghetti and other noodles, firm tofu, bagels, oatmeal   Meat/meat replacements, fish, poultry Sardines   Desserts Chocolate cake   Fats and Oils Macadamia nuts, pistachio nuts, English walnuts   Other Foods Jams or jellies (made with the fruits above), pepper   High-Oxalate Foods Foods to Avoid Drinks Chocolate drink mixes, soy milk, Ovaltine, instant iced tea, fruit juices of fruits listed below Fruits Apricots (dried), red currants, figs, kiwi, plums, rhubarb Veggies Beans (wax, dried), beets and beet greens, chives, collard greens, eggplant, escarole, dark greens of all kinds, leeks, okra, parsley, rutabagas, spinach, Swiss chard, tomato paste, watercress Breads, Cereals, Grains Amaranth, barley, white corn flour, fried potatoes, fruitcake, grits, soybean products, sweet potatoes, wheat germ and bran, buckwheat flour, All Bran cereal, graham crackers, pretzels, whole wheat bread Meat/meat replacements, fish, poultry Dried beans, peanut butter, soy burgers, miso  Desserts Carob, chocolate, marmalades Fats and Oils Nuts (peanuts, almonds, pecans, cashews, hazelnuts), nut butters, sesame seeds, tahini paste Other Foods Poppy seeds

## 2023-05-01 NOTE — Progress Notes (Signed)
Name: Caprica Arntz DOB: 02-27-1976 MRN: [id number]  History of Present Illness: Ms. Yacko is a 47 y.o. female who presents today for follow up visit at [facility] [place]. - GU History: 1. Kidney stones.  - Has had prior ESWL, URS, and PCNL procedures.  - 11/03/2022: Stone analysis showed 100% calcium oxalate composition. - Taking sodium bicarb and Allopurinol for stone prevention. Could not tolerate UrocitK due to hyperkalemia.  - Taking Flomax daily. 2. Urinary urgency and frequency. - Taking Myrbetriq 25 mg daily. 3. Prior recurrent UTIs. No positive urine culture results in past 12 months found per chart review.  At last visit with Dr. Ronne Binning on 12/12/2022: The plan was to repeat 24 hour urinalysis and follow up in 3 months with RUS.  Since last visit: - 02/28/2023: [facility] 24 hour urinalysis showed hypocitraturia, hyperoxaluria, hyperuricosuria.  - 03/13/2023: RUS showed:  1. 3.4 mm nonobstructive stone in the left kidney. 2. No other abnormalities.  Today: She denies recent episode of stone pain / passage. She reports some right flank pain; denies left flank pain. She is having some LLQ abdominal pain and today has a low grade fevers. Denies any acute changes with bowel function; reports chronic diarrhea. She reports some recent nausea, which she states may be related to migraines.   She reports some urinary hesitancy along with persistent urinary urgency, frequency, and sensations of incomplete emptying. Sometimes voiding almost hourly. She reports increased some mild dysuria; denies gross hematuria.    Fall Screening: Do you usually have a device to assist in your mobility? No   Medications: Current Outpatient Medications  Medication Sig Dispense Refill   acetaminophen (TYLENOL) 500 MG tablet Take 1,000 mg by mouth every 6 (six) hours as needed for moderate pain.     allopurinol (ZYLOPRIM) 300 MG tablet TAKE 1 TABLET EVERY DAY (Patient taking  differently: Take 300 mg by mouth at bedtime.) 90 tablet 3   baclofen (LIORESAL) 10 MG tablet Take 10 mg by mouth every 8 (eight) hours.     celecoxib (CELEBREX) 200 MG capsule Take 200 mg by mouth in the morning.     clonazePAM (KLONOPIN) 1 MG tablet Take 1 mg by mouth at bedtime. Takes 1 1/2 QHS  0   diphenhydrAMINE (BENADRYL) 25 MG tablet Take 25 mg by mouth every 6 (six) hours as needed (take with for migraines).     ezetimibe (ZETIA) 10 MG tablet Take 10 mg by mouth in the morning.     gabapentin (NEURONTIN) 800 MG tablet Take 2 tablets (1,600 mg total) by mouth 2 (two) times daily. 30 tablet 8   lamoTRIgine (LAMICTAL) 200 MG tablet Take 200 mg by mouth at bedtime. Takes 150 X 2     Multiple Vitamin (MULTIVITAMIN WITH MINERALS) TABS tablet Take 1 tablet by mouth in the morning. One A Day     omeprazole (PRILOSEC) 40 MG capsule Take 40 mg by mouth 2 (two) times daily.     ondansetron (ZOFRAN) 4 MG tablet Take 1 tablet (4 mg total) by mouth daily as needed for nausea or vomiting. 30 tablet 1   Polyethyl Glycol-Propyl Glycol (SYSTANE OP) Place 1 drop into both eyes in the morning, at noon, and at bedtime.     prochlorperazine (COMPAZINE) 10 MG tablet Take 10 mg by mouth every 6 (six) hours as needed (migraines (nausea)).     promethazine (PHENERGAN) 25 MG tablet Take 1 tablet (25 mg total) by mouth 4 (four) times daily as needed  for nausea or vomiting. 30 tablet 0   rosuvastatin (CRESTOR) 5 MG tablet Take 5 mg by mouth at bedtime.     Simethicone (GAS RELIEF) 250 MG CAPS Take 250 mg by mouth 3 (three) times daily as needed (gas/bloating).     sodium bicarbonate 650 MG tablet Take 1 tablet (650 mg total) by mouth 2 (two) times daily. 60 tablet 11   tamsulosin (FLOMAX) 0.4 MG CAPS capsule TAKE 1 CAPSULE EVERY DAY AFTER SUPPER 90 capsule 3   tiZANidine (ZANAFLEX) 2 MG tablet Take 2 mg by mouth every 8 (eight) hours.     albuterol (VENTOLIN HFA) 108 (90 Base) MCG/ACT inhaler Inhale 1-2 puffs into  the lungs every 6 (six) hours as needed for shortness of breath or wheezing. (Patient not taking: Reported on 05/01/2023)     methylphenidate (RITALIN) 5 MG tablet Take 5 mg by mouth at bedtime.     mirabegron ER (MYRBETRIQ) 25 MG TB24 tablet Take 2 tablets (50 mg total) by mouth daily. 180 tablet 3   No current facility-administered medications for this visit.    Allergies: Allergies  Allergen Reactions   Penicillins Swelling and Rash    Rash all over [place] and lip swelling. Has patient had a PCN reaction causing immediate rash, facial/tongue/throat swelling, SOB or lightheadedness with hypotension: Yes Has patient had a PCN reaction causing severe rash involving mucus membranes or skin necrosis: Yes Has patient had a PCN reaction that required hospitalization: No If all of the above answers are "NO", then may proceed with Cephalosporin use.   Nortriptyline Other (See Comments)    Severe chest pain     Nsaids Other (See Comments)    Advised not to take due to gastric sleeve    Amoxicillin Rash    Rash all over [place]    Ciprofloxacin Diarrhea and Nausea Only    Past Medical History:  Diagnosis Date   Abnormal Pap smear of cervix 12/2014   Acid reflux    Adrenal insufficiency (HCC)    Anxiety    Arthritis    Arthritis    Depression    Depression    Endometriosis    Fibroids    age 73   Fibromyalgia    High cholesterol    History of kidney stones    Liver disease    per pt report, will have biopsy Nov 08, 2017   Migraines    Pleurisy 02/2015   while hospitalized    Seizure (HCC)    on meds and no seizures in 2-3 years. Unknown etiology   Skin cancer (melanoma) (HCC)    Syncope and collapse    Uterine cancer (HCC)    Past Surgical History:  Procedure Laterality Date   ABDOMINAL HYSTERECTOMY     ABLATION     uterine   COLECTOMY     CYSTOSCOPY W/ URETERAL STENT PLACEMENT Left 02/02/2015   Procedure: CYSTOSCOPY WITH RETROGRADE PYELOGRAM/URETERAL STENT PLACEMENT;   Surgeon: Jerilee Field, MD;  Location: [facility];  Service: Urology;  Laterality: Left;   CYSTOSCOPY WITH RETROGRADE PYELOGRAM, URETEROSCOPY AND STENT PLACEMENT Bilateral 01/20/2022   Procedure: CYSTOSCOPY WITH RETROGRADE PYELOGRAM, URETEROSCOPY AND STENT PLACEMENT;  Surgeon: Malen Gauze, MD;  Location: [facility];  Service: Urology;  Laterality: Bilateral;   CYSTOSCOPY WITH RETROGRADE PYELOGRAM, URETEROSCOPY AND STENT PLACEMENT Left 11/03/2022   Procedure: CYSTOSCOPY WITH RETROGRADE PYELOGRAM, URETEROSCOPY AND STENT PLACEMENT;  Surgeon: Malen Gauze, MD;  Location: [facility];  Service: Urology;  Laterality: Left;  pt knows to arrive at 9:00   CYSTOSCOPY WITH URETEROSCOPY AND STENT PLACEMENT Left 02/13/2015   Procedure: LEFT URETEROSCOPY WITH HOLMIUM LASER AND STENT PLACEMENT;  Surgeon: Jerilee Field, MD;  Location: [facility];  Service: Urology;  Laterality: Left;   DILATION AND CURETTAGE OF UTERUS     ESOPHAGOGASTRODUODENOSCOPY ENDOSCOPY  10/11/2017   @ [facility]; BIOPSY OF LOWER INTESTINE   EXTRACORPOREAL SHOCK WAVE LITHOTRIPSY Left 09/13/2022   Procedure: EXTRACORPOREAL SHOCK WAVE LITHOTRIPSY (ESWL);  Surgeon: Malen Gauze, MD;  Location: [facility];  Service: Urology;  Laterality: Left;   HOLMIUM LASER APPLICATION N/A 02/13/2015   Procedure: HOLMIUM LASER APPLICATION;  Surgeon: Jerilee Field, MD;  Location: [facility];  Service: Urology;  Laterality: N/A;   IR NEPHROSTOMY PLACEMENT RIGHT  07/05/2021   KNEE SURGERY Bilateral 2019   realignment of knees   LAPAROSCOPIC GASTRIC SLEEVE RESECTION     LAPAROSCOPY     x 4   MELANOMA EXCISION  2000   melanoma removed from face, basal cell removed from nose   NEPHROLITHOTOMY Right 07/08/2021   Procedure: NEPHROLITHOTOMY PERCUTANEOUS- with stent placement;  Surgeon: Malen Gauze, MD;  Location: [facility];  Service: Urology;  Laterality: Right;   REPAIR VAGINAL CUFF N/A 08/21/2015   Procedure: REPAIR VAGINAL CUFF, EXAM UNDER ANESTHESIA;   Surgeon: Adolphus Birchwood, MD;  Location: [facility];  Service: Gynecology;  Laterality: N/A;   ROBOTIC ASSISTED TOTAL HYSTERECTOMY WITH BILATERAL SALPINGO OOPHERECTOMY Bilateral 05/21/2015   Procedure: XI ROBOTIC ASSISTED TOTAL HYSTERECTOMY WITH BILATERAL SALPINGO OOPHORECTOMY;  Surgeon: Adolphus Birchwood, MD;  Location: [facility];  Service: Gynecology;  Laterality: Bilateral;   STONE EXTRACTION WITH BASKET Bilateral 01/20/2022   Procedure: STONE EXTRACTION WITH BASKET;  Surgeon: Malen Gauze, MD;  Location: [facility];  Service: Urology;  Laterality: Bilateral;   STONE EXTRACTION WITH BASKET Left 11/03/2022   Procedure: STONE EXTRACTION WITH BASKET;  Surgeon: Malen Gauze, MD;  Location: [facility];  Service: Urology;  Laterality: Left;   Family History  Problem Relation Age of Onset   Hypertension Mother    Melanoma Mother    Breast cancer Mother 25   Cancer - Other Father        liver   Diabetes Mellitus II Maternal Grandmother    Heart disease Maternal Grandmother    Breast cancer Maternal Grandmother    Cancer - Lung Maternal Grandfather    Osteoporosis Paternal Grandmother    Melanoma Paternal Grandfather    Social History   Socioeconomic History   Marital status: Married    Spouse name: Not on file   Number of children: 1   Years of education: Not on file   Highest education level: Associate degree: occupational, Scientist, product/process development, or vocational program  Occupational History   Not on file  Tobacco Use   Smoking status: Never   Smokeless tobacco: Never  Vaping Use   Vaping status: Never Used  Substance and Sexual Activity   Alcohol use: No   Drug use: No   Sexual activity: Yes  Other Topics Concern   Not on file  Social History Narrative   Lives at home with family. Currently living with mother to help take care of her    Right handed   No Caffeine   Social Determinants of Health   Financial Resource Strain: Patient Declined (04/10/2023)   Received from [facility]    Overall Financial Resource Strain (CARDIA)    Difficulty of Paying Living Expenses: Patient declined  Food  Insecurity: Patient Declined (04/10/2023)   Received from [facility]   Hunger Vital Sign    Worried About Running Out of Food in the Last Year: Patient declined    Ran Out of Food in the Last Year: Patient declined  Transportation Needs: Patient Declined (04/10/2023)   Received from [facility] - Transportation    In the past 12 months, has lack of transportation kept you from medical appointments or from getting medications?: Patient declined    Lack of Transportation (Non-Medical): Patient declined  Physical Activity: Not on file  Stress: Not on file  Social Connections: Unknown (02/01/2022)   Received from [facility]   Social Network    Social Network: Not on file  Intimate Partner Violence: Unknown (01/05/2022)   Received from [facility]   HITS    Physically Hurt: Not on file    Insult or Talk Down To: Not on file    Threaten Physical Harm: Not on file    Scream or Curse: Not on file    SUBJECTIVE  Review of Systems Constitutional: Patient denies any unintentional weight loss or change in strength lntegumentary: Patient denies any rashes or pruritus Cardiovascular: Patient denies chest pain or syncope Respiratory: Patient denies shortness of breath Gastrointestinal: As per HPI Musculoskeletal: Patient denies muscle cramps or weakness Neurologic: Patient reports headache  Psychiatric: Patient denies memory problems Allergic/Immunologic: Patient denies recent allergic reaction(s) Hematologic/Lymphatic: Patient denies bleeding tendencies Endocrine: Patient denies heat/cold intolerance  GU: As per HPI.  OBJECTIVE Vitals:   05/01/23 1113  BP: 102/73  Pulse: (!) 101  Temp: 99.2 F (37.3 C)   There is no height or weight on file to calculate BMI.  Physical Examination  Constitutional: No obvious distress; patient is  non-toxic appearing  Cardiovascular: No visible lower extremity edema.  Respiratory: The patient does not have audible wheezing/stridor; respirations do not appear labored  Gastrointestinal: Abdomen non-distended Musculoskeletal: Normal ROM of UEs  Skin: No obvious rashes/open sores  Neurologic: CN 2-12 grossly intact Psychiatric: Answered questions appropriately with normal affect  Hematologic/Lymphatic/Immunologic: No obvious bruises or sites of spontaneous bleeding  UA: negative PVR: 0 ml  ASSESSMENT Kidney stones - Plan: Urinalysis, Routine w reflex microscopic, BLADDER SCAN AMB NON-IMAGING  Urinary frequency - Plan: BLADDER SCAN AMB NON-IMAGING, mirabegron ER (MYRBETRIQ) 25 MG TB24 tablet  History of recurrent UTIs - Plan: BLADDER SCAN AMB NON-IMAGING  OAB (overactive bladder)  Urinary urgency  LLQ pain  Chronic diarrhea  S/P gastric bypass  We reviewed recent imaging results; no acute findings.  We discussed option to get KUB today for further evaluation of LLQ pain; she elected to hold on that today but is aware she can notify provider if needed. We discussed risk for uric acid stones which are not radiopaque given finding of hyperuricosuria and possible indication for CT if KUB were to be negative and stone symptoms persisted. She is taking Flomax daily and will continue that for medical expulsive therapy.   For stone prevention we discussed dietary changes recommended based on the 24 hour urinalysis results including decreasing animal protein intake and increasing citric acid intake. She has an appointment with a dietician coming up soon and plans to discuss protein at that visit given that she is supposed to prioritize protein in her diet s/p gastric bypass. She is aware to continue maintaining adequate daily fluid intake also and will continue current medications for stone prevention as previously prescribed by Dr. Ronne Binning (sodium  bicarb and Allopurinol).   For OAB  with urinary frequency and urgency we agreed to increase Myrbetriq to 50 mg daily and follow up in 8 weeks for symptom recheck. Discussed caffeine intake.  Pt verbalized understanding and agreement. All questions were answered.   PLAN Advised the following: Myrbetriq 50 mg daily. Continue minimal caffeine intake. Maintain adequate fluid intake. Minimize animal protein intake. Increase citric acid intake.  Continue sodium bicarb and Allopurinol for stone prevention.  Continue Flomax daily. Return in about 8 weeks (around 06/26/2023) for UA, PVR, & f/u with Evette Georges NP.  Orders Placed This Encounter  Procedures   Urinalysis, Routine w reflex microscopic   BLADDER SCAN AMB NON-IMAGING   Total time spent caring for the patient today was over 30 minutes. This includes time spent on the date of the visit reviewing the patient's chart before the visit, time spent during the visit, and time spent after the visit on documentation. Over 50% of that time was spent in face-to-face time with this patient for direct counseling. E&M based on time and complexity of medical decision making.  It has been explained that the patient is to follow regularly with their PCP in addition to all other providers involved in their care and to follow instructions provided by these respective offices. Patient advised to contact urology clinic if any urologic-pertaining questions, concerns, new symptoms or problems arise in the interim period.  Patient Instructions  Hyperoxaluria  What it is: Too much oxalate in the urine (> 45 mg/day) Why it matters: Increases risk for calcium oxalate stones Potential causes / contributing factors:  High intake of oxalate-rich foods  Very low calcium intake How to manage:   Dietary changes:  Low oxalate diet Increase fluid intake Increase calcium intake (to bind with oxalate in gut) Supplement use:  Beelith (magnesium oxide + vitamin B6 (pyridoxine)) Mechanism of action:  Helps inhibit crystal formation and reduce hyperoxaluria Magnesium supplement is preferred over magnesium-rich diet because magnesium-rich foods tend to be high in oxalate   Hyperuricosuria What it is: Too much uric acid in the urine (> 600 mg/day) Why it matters: Increases risk for calcium oxalate stones and uric acid stones Potential causes / contributing factors:  Obesity High animal protein intake Gout Hypertriglyceridemia How to manage:  Dietary changes:  Limit animal protein to 6-8 oz daily Possible medications: Potassium Citrate (Urocit-K or Polycitra-K) Allopurinol   Hypocitraturia  What it is: Too little citrate in the urine (< 500 mg/day) Why it matters: Increases risk for calcium oxalate stones Potential causes / contributing factors: Renal tubular acidosis (RTA) Chronic diarrheal syndrome Potassium-wasting diuretic medication (like thiazide diuretics) Topiramate use Diabetes High intake of animal protein and salt How to manage:  Dietary changes:  Increase citrus fruit intake Decrease animal protein Decrease sodium intake Possible medications: Potassium Citrate (Urocit-K or Polycitra-K)         >80% of stones are calcium oxalate. This type of stones forms when body either isn't clearing oxalate well enough, is making too much oxalate, or too little citrate. This results in oxalate binding to form crystals, which continue to aggregate and form stones.  Limiting calcium does not help, but limiting oxalate in the diet can help. Increasing citric acid intake may also help.  The following measures may help to prevent the recurrence of stones: Increase water intake to 2-2.5 liters per day May add citrus juice (lemon, lime or orange juice) to water Moderation in dairy foods Decrease in salt content Low Oxalate  diet: Oxylates are found in foods like Tomato, Spinach, red wine and chocolate (see additional resources below).  Internet resources for information  regarding low oxalate diet:  https://kidneystones.yangchunwu.com https://my.VerticalStretch.be  Foods Low in Sodium or Oxalate Foods You Can Eat  Drinks Coffee, fruit and veggie juice (using the recommended veggies), fruit punch  Fruits Apples, apricots (fresh or canned), avocado, bananas, cherries (sweet), cranberries, grapefruit, red or green grapes, lemon and lime juice, melons, nectarines, papayas, peaches, pears, pineapples, oranges, strawberries (fresh), tangerines  Veggies Artichokes, asparagus, bamboo shoots, broccoli, brussels sprouts, cabbage, cauliflower, chayote squash, chicory, corn, cucumbers, endive, lettuce, lima beans, mushrooms, onions, peas, peppers, potatoes, radishes, rutabagas, zucchini  Breads, Cereals, Grains Egg noodles, rye bread, cooked and dry cereals without nuts or bran, crackers with unsalted tops, white or wild rice  Meat, Meat Replacements, Fish, Recruitment consultant, fish, poultry, eggs, egg whites, egg replacements  Soup Homemade soup (using the recommended veggies and meat), low-sodium bouillon, low-sodium canned  Desserts Cookies, cakes, ice cream, pudding without chocolate or nuts, candy without chocolate or nuts  Fats and Oils Butter, margarine, cream, oil, salad dressing, mayo  Other Foods Unsalted potato chips or pretzels, herbs (like garlic, garlic powder, onion powder), lemon juice, salt-free seasoning blends, vinegar  Other Foods Low in Oxalate Foods You Can Eat  Drinks Beer, cola, wine, buttermilk, lemonade or limeade (without added vitamin C), milk  Meat, Meat Replacements, Fish, [employer] meat, ham, bacon, hot dogs, bratwurst, sausage, chicken nuggets, cheddar cheese, canned fish and shellfish  Soup Tomato soup, cheese soup  Other Foods Coconuts, lemon or lime juices, sugar or sweeteners, jellies or jams (from the recommended list)   Moderate-Oxalate Foods  Foods to Limit   Drinks Fruit and veggie juices (from the list below), chocolate milk, rice milk, hot cocoa, tea   Fruits Blackberries, blueberries, black currants, cherries (sour), fruit cocktail, mangoes, orange peel, prunes, purple plums   Veggies Baked beans, carrots, celery, green beans, parsnips, summer squash, tomatoes, turnips   Breads, Cereals, Grains White bread, cornbread or cornmeal, white English muffins, saltine or soda crackers, brown rice, vanilla wafers, spaghetti and other noodles, firm tofu, bagels, oatmeal   Meat/meat replacements, fish, poultry Sardines   Desserts Chocolate cake   Fats and Oils Macadamia nuts, pistachio nuts, English walnuts   Other Foods Jams or jellies (made with the fruits above), pepper   High-Oxalate Foods Foods to Avoid Drinks Chocolate drink mixes, soy milk, Ovaltine, instant iced tea, fruit juices of fruits listed below Fruits Apricots (dried), red currants, figs, kiwi, plums, rhubarb Veggies Beans (wax, dried), beets and beet greens, chives, collard greens, eggplant, escarole, dark greens of all kinds, leeks, okra, parsley, rutabagas, spinach, Swiss chard, tomato paste, watercress Breads, Cereals, Grains Amaranth, barley, white corn flour, fried potatoes, fruitcake, grits, soybean products, sweet potatoes, wheat germ and bran, buckwheat flour, All Bran cereal, graham crackers, pretzels, whole wheat bread Meat/meat replacements, fish, poultry Dried beans, peanut butter, soy burgers, miso Desserts Carob, chocolate, marmalades Fats and Oils Nuts (peanuts, almonds, pecans, cashews, hazelnuts), nut butters, sesame seeds, tahini paste Other Foods Poppy seeds   Electronically signed by:  Donnita Falls, MSN, FNP-C, CUNP 05/01/2023 12:30 PM

## 2023-05-02 DIAGNOSIS — G43011 Migraine without aura, intractable, with status migrainosus: Secondary | ICD-10-CM | POA: Diagnosis not present

## 2023-05-02 DIAGNOSIS — G43711 Chronic migraine without aura, intractable, with status migrainosus: Secondary | ICD-10-CM | POA: Diagnosis not present

## 2023-05-09 DIAGNOSIS — M797 Fibromyalgia: Secondary | ICD-10-CM | POA: Diagnosis not present

## 2023-05-09 DIAGNOSIS — M79644 Pain in right finger(s): Secondary | ICD-10-CM | POA: Diagnosis not present

## 2023-05-09 DIAGNOSIS — M533 Sacrococcygeal disorders, not elsewhere classified: Secondary | ICD-10-CM | POA: Diagnosis not present

## 2023-05-09 DIAGNOSIS — G894 Chronic pain syndrome: Secondary | ICD-10-CM | POA: Diagnosis not present

## 2023-05-09 DIAGNOSIS — M5412 Radiculopathy, cervical region: Secondary | ICD-10-CM | POA: Diagnosis not present

## 2023-05-09 DIAGNOSIS — R0789 Other chest pain: Secondary | ICD-10-CM | POA: Diagnosis not present

## 2023-05-09 DIAGNOSIS — M5441 Lumbago with sciatica, right side: Secondary | ICD-10-CM | POA: Diagnosis not present

## 2023-05-10 ENCOUNTER — Telehealth: Payer: Self-pay

## 2023-05-10 NOTE — Telephone Encounter (Signed)
Patient called requesting alternative to the mirabegron due to insurance not covering medication.  Patient given last few samples in office to cover until alternative is given.  Please advise.

## 2023-05-15 ENCOUNTER — Ambulatory Visit: Payer: Medicare HMO | Admitting: Podiatry

## 2023-05-16 NOTE — Telephone Encounter (Signed)
Just to clarify, patient states that her insurance will not cover mirabegron 25 mg bid. Her insurance will only cover qd. She requires 50 mg daily. So there is no other alternative for her at this time correct?

## 2023-05-22 DIAGNOSIS — L739 Follicular disorder, unspecified: Secondary | ICD-10-CM | POA: Diagnosis not present

## 2023-05-22 MED ORDER — MIRABEGRON ER 50 MG PO TB24: 50.0000 mg | ORAL_TABLET | Freq: Every day | ORAL | 11 refills | Status: DC

## 2023-05-22 NOTE — Telephone Encounter (Signed)
See below. Would you like me to send in?

## 2023-05-22 NOTE — Telephone Encounter (Signed)
Good morning! We are open and your samples are still up front. We will send the name brand Myrbetriq 50 mg for you and will follow up pending how your insurance responds.

## 2023-06-02 DIAGNOSIS — R200 Anesthesia of skin: Secondary | ICD-10-CM | POA: Diagnosis not present

## 2023-06-02 DIAGNOSIS — G5732 Lesion of lateral popliteal nerve, left lower limb: Secondary | ICD-10-CM | POA: Diagnosis not present

## 2023-06-02 DIAGNOSIS — R202 Paresthesia of skin: Secondary | ICD-10-CM | POA: Diagnosis not present

## 2023-06-06 ENCOUNTER — Other Ambulatory Visit: Payer: Self-pay | Admitting: Physician Assistant

## 2023-06-06 DIAGNOSIS — Z1231 Encounter for screening mammogram for malignant neoplasm of breast: Secondary | ICD-10-CM

## 2023-06-07 ENCOUNTER — Ambulatory Visit: Payer: Medicare HMO | Admitting: Podiatry

## 2023-06-08 ENCOUNTER — Other Ambulatory Visit: Payer: Self-pay | Admitting: Physician Assistant

## 2023-06-08 DIAGNOSIS — N649 Disorder of breast, unspecified: Secondary | ICD-10-CM

## 2023-06-22 NOTE — Progress Notes (Deleted)
Name: Denise Avila DOB: 1976/06/20 MRN: [id number]  History of Present Illness: Denise Avila is a 47 y.o. female who presents today for follow up visit at [facility] [place]. - GU History: 1. Kidney stones.  - Has had prior ESWL, URS, and PCNL procedures.  - 11/03/2022: Stone analysis showed 100% calcium oxalate composition. - 02/28/2023: Litholink 24 hour urinalysis showed hypocitraturia, hyperoxaluria, hyperuricosuria. - 03/13/2023: RUS showed a 3.4 mm nonobstructive stone in the left kidney. - ***Taking sodium bicarb and Allopurinol for stone prevention. Could not tolerate UrocitK due to hyperkalemia.  - ***Taking Flomax daily. 2. Urinary urgency and frequency. - ***Taking Myrbetriq 50 mg daily. 3. Prior recurrent UTIs. ***No positive urine culture results in past 12 months found per chart review.   At last visit on 05/01/2023: - The plan was: Increased Myrbetriq to 50 mg daily. Continue minimal caffeine intake. Maintain adequate fluid intake. Minimize animal protein intake. Increase citric acid intake.  Continue sodium bicarb and Allopurinol for stone prevention.  Continue Flomax daily. Return in about 8 weeks (around 06/26/2023) for UA, PVR, & f/u with Denise Georges NP.  Since last visit: ***  Today: She {Actions; denies-reports:120008} symptomatic improvement since increasing Myrbetriq (Denise Avila) to 50 mg daily.  She reports {Blank multiple:19197::"improved","persistent / unchanged"} urinary ***frequency, ***nocturia, ***urgency, and ***urge incontinence. Voiding ***x/day and ***x/night on average. Leaking ***x/day on average; using *** ***pads / ***diapers per day on average.  She {Actions; denies-reports:120008} dysuria, gross hematuria, straining to void, or sensations of incomplete emptying.  ***schedule RUS in late January / early February 2025 for 6 month stone surveillance   Fall Screening: Do you usually have a device to assist in your mobility?  {yes/no:20286}  ***cane / ***walker / ***wheelchair  Medications: Current Outpatient Medications  Medication Sig Dispense Refill   acetaminophen (TYLENOL) 500 MG tablet Take 1,000 mg by mouth every 6 (six) hours as needed for moderate pain.     albuterol (VENTOLIN HFA) 108 (90 Base) MCG/ACT inhaler Inhale 1-2 puffs into the lungs every 6 (six) hours as needed for shortness of breath or wheezing. (Patient not taking: Reported on 05/01/2023)     allopurinol (ZYLOPRIM) 300 MG tablet TAKE 1 TABLET EVERY DAY (Patient taking differently: Take 300 mg by mouth at bedtime.) 90 tablet 3   baclofen (LIORESAL) 10 MG tablet Take 10 mg by mouth every 8 (eight) hours.     celecoxib (CELEBREX) 200 MG capsule Take 200 mg by mouth in the morning.     clonazePAM (KLONOPIN) 1 MG tablet Take 1 mg by mouth at bedtime. Takes 1 1/2 QHS  0   diphenhydrAMINE (BENADRYL) 25 MG tablet Take 25 mg by mouth every 6 (six) hours as needed (take with for migraines).     ezetimibe (ZETIA) 10 MG tablet Take 10 mg by mouth in the morning.     gabapentin (NEURONTIN) 800 MG tablet Take 2 tablets (1,600 mg total) by mouth 2 (two) times daily. 30 tablet 8   lamoTRIgine (LAMICTAL) 200 MG tablet Take 200 mg by mouth at bedtime. Takes 150 X 2     methylphenidate (RITALIN) 5 MG tablet Take 5 mg by mouth at bedtime.     Denise Avila ER (MYRBETRIQ) 25 MG TB24 tablet Take 2 tablets (50 mg total) by mouth daily. 180 tablet 3   Denise Avila ER (MYRBETRIQ) 50 MG TB24 tablet Take 1 tablet (50 mg total) by mouth daily. 30 tablet 11   Multiple Vitamin (MULTIVITAMIN WITH MINERALS) TABS tablet Take 1 tablet  by mouth in the morning. One A Day     omeprazole (PRILOSEC) 40 MG capsule Take 40 mg by mouth 2 (two) times daily.     ondansetron (ZOFRAN) 4 MG tablet Take 1 tablet (4 mg total) by mouth daily as needed for nausea or vomiting. 30 tablet 1   Polyethyl Glycol-Propyl Glycol (SYSTANE OP) Place 1 drop into both eyes in the morning, at noon, and at  bedtime.     prochlorperazine (COMPAZINE) 10 MG tablet Take 10 mg by mouth every 6 (six) hours as needed (migraines (nausea)).     promethazine (PHENERGAN) 25 MG tablet Take 1 tablet (25 mg total) by mouth 4 (four) times daily as needed for nausea or vomiting. 30 tablet 0   rosuvastatin (CRESTOR) 5 MG tablet Take 5 mg by mouth at bedtime.     Simethicone (GAS RELIEF) 250 MG CAPS Take 250 mg by mouth 3 (three) times daily as needed (gas/bloating).     sodium bicarbonate 650 MG tablet Take 1 tablet (650 mg total) by mouth 2 (two) times daily. 60 tablet 11   tamsulosin (FLOMAX) 0.4 MG CAPS capsule TAKE 1 CAPSULE EVERY DAY AFTER SUPPER 90 capsule 3   tiZANidine (ZANAFLEX) 2 MG tablet Take 2 mg by mouth every 8 (eight) hours.     No current facility-administered medications for this visit.    Allergies: Allergies  Allergen Reactions   Penicillins Swelling and Rash    Rash all over [place] and lip swelling. Has patient had a PCN reaction causing immediate rash, facial/tongue/throat swelling, SOB or lightheadedness with hypotension: Yes Has patient had a PCN reaction causing severe rash involving mucus membranes or skin necrosis: Yes Has patient had a PCN reaction that required hospitalization: No If all of the above answers are "NO", then may proceed with Cephalosporin use.   Nortriptyline Other (See Comments)    Severe chest pain     Nsaids Other (See Comments)    Advised not to take due to gastric sleeve    Amoxicillin Rash    Rash all over [place]    Ciprofloxacin Diarrhea and Nausea Only    Past Medical History:  Diagnosis Date   Abnormal Pap smear of cervix 12/2014   Acid reflux    Adrenal insufficiency (HCC)    Anxiety    Arthritis    Arthritis    Depression    Depression    Endometriosis    Fibroids    age 75   Fibromyalgia    High cholesterol    History of kidney stones    Liver disease    per pt report, will have biopsy Nov 08, 2017   Migraines    Pleurisy 02/2015    while hospitalized    Seizure (HCC)    on meds and no seizures in 2-3 years. Unknown etiology   Skin cancer (melanoma) (HCC)    Syncope and collapse    Uterine cancer (HCC)    Past Surgical History:  Procedure Laterality Date   ABDOMINAL HYSTERECTOMY     ABLATION     uterine   COLECTOMY     CYSTOSCOPY W/ URETERAL STENT PLACEMENT Left 02/02/2015   Procedure: CYSTOSCOPY WITH RETROGRADE PYELOGRAM/URETERAL STENT PLACEMENT;  Surgeon: Jerilee Field, MD;  Location: [facility];  Service: Urology;  Laterality: Left;   CYSTOSCOPY WITH RETROGRADE PYELOGRAM, URETEROSCOPY AND STENT PLACEMENT Bilateral 01/20/2022   Procedure: CYSTOSCOPY WITH RETROGRADE PYELOGRAM, URETEROSCOPY AND STENT PLACEMENT;  Surgeon: Malen Gauze, MD;  Location: [facility];  Service: Urology;  Laterality: Bilateral;   CYSTOSCOPY WITH RETROGRADE PYELOGRAM, URETEROSCOPY AND STENT PLACEMENT Left 11/03/2022   Procedure: CYSTOSCOPY WITH RETROGRADE PYELOGRAM, URETEROSCOPY AND STENT PLACEMENT;  Surgeon: Malen Gauze, MD;  Location: [facility];  Service: Urology;  Laterality: Left;  pt knows to arrive at 9:00   CYSTOSCOPY WITH URETEROSCOPY AND STENT PLACEMENT Left 02/13/2015   Procedure: LEFT URETEROSCOPY WITH HOLMIUM LASER AND STENT PLACEMENT;  Surgeon: Jerilee Field, MD;  Location: [facility];  Service: Urology;  Laterality: Left;   DILATION AND CURETTAGE OF UTERUS     ESOPHAGOGASTRODUODENOSCOPY ENDOSCOPY  10/11/2017   @ [facility]; BIOPSY OF LOWER INTESTINE   EXTRACORPOREAL SHOCK WAVE LITHOTRIPSY Left 09/13/2022   Procedure: EXTRACORPOREAL SHOCK WAVE LITHOTRIPSY (ESWL);  Surgeon: Malen Gauze, MD;  Location: [facility];  Service: Urology;  Laterality: Left;   HOLMIUM LASER APPLICATION N/A 02/13/2015   Procedure: HOLMIUM LASER APPLICATION;  Surgeon: Jerilee Field, MD;  Location: [facility];  Service: Urology;  Laterality: N/A;   IR NEPHROSTOMY PLACEMENT RIGHT  07/05/2021   KNEE SURGERY Bilateral 2019   realignment of knees    LAPAROSCOPIC GASTRIC SLEEVE RESECTION     LAPAROSCOPY     x 4   MELANOMA EXCISION  2000   melanoma removed from face, basal cell removed from nose   NEPHROLITHOTOMY Right 07/08/2021   Procedure: NEPHROLITHOTOMY PERCUTANEOUS- with stent placement;  Surgeon: Malen Gauze, MD;  Location: [facility];  Service: Urology;  Laterality: Right;   REPAIR VAGINAL CUFF N/A 08/21/2015   Procedure: REPAIR VAGINAL CUFF, EXAM UNDER ANESTHESIA;  Surgeon: Adolphus Birchwood, MD;  Location: [facility];  Service: Gynecology;  Laterality: N/A;   ROBOTIC ASSISTED TOTAL HYSTERECTOMY WITH BILATERAL SALPINGO OOPHERECTOMY Bilateral 05/21/2015   Procedure: XI ROBOTIC ASSISTED TOTAL HYSTERECTOMY WITH BILATERAL SALPINGO OOPHORECTOMY;  Surgeon: Adolphus Birchwood, MD;  Location: [facility];  Service: Gynecology;  Laterality: Bilateral;   STONE EXTRACTION WITH BASKET Bilateral 01/20/2022   Procedure: STONE EXTRACTION WITH BASKET;  Surgeon: Malen Gauze, MD;  Location: [facility];  Service: Urology;  Laterality: Bilateral;   STONE EXTRACTION WITH BASKET Left 11/03/2022   Procedure: STONE EXTRACTION WITH BASKET;  Surgeon: Malen Gauze, MD;  Location: [facility];  Service: Urology;  Laterality: Left;   Family History  Problem Relation Age of Onset   Hypertension Mother    Melanoma Mother    Breast cancer Mother 80   Cancer - Other Father        liver   Diabetes Mellitus II Maternal Grandmother    Heart disease Maternal Grandmother    Breast cancer Maternal Grandmother    Cancer - Lung Maternal Grandfather    Osteoporosis Paternal Grandmother    Melanoma Paternal Grandfather    Social History   Socioeconomic History   Marital status: Married    Spouse name: Not on file   Number of children: 1   Years of education: Not on file   Highest education level: Associate degree: occupational, Scientist, product/process development, or vocational program  Occupational History   Not on file  Tobacco Use   Smoking status: Never   Smokeless tobacco: Never  Vaping Use    Vaping status: Never Used  Substance and Sexual Activity   Alcohol use: No   Drug use: No   Sexual activity: Yes  Other Topics Concern   Not on file  Social History Narrative   Lives at home with family. Currently living with mother to help take care of her    Right handed  No Caffeine   Social Determinants of Health   Financial Resource Strain: Patient Declined (04/10/2023)   Received from [facility]   Overall Financial Resource Strain (CARDIA)    Difficulty of Paying Living Expenses: Patient declined  Food Insecurity: Patient Declined (04/10/2023)   Received from [facility]   Hunger Vital Sign    Worried About Running Out of Food in the Last Year: Patient declined    Ran Out of Food in the Last Year: Patient declined  Transportation Needs: Patient Declined (04/10/2023)   Received from [facility] - Transportation    In the past 12 months, has lack of transportation kept you from medical appointments or from getting medications?: Patient declined    Lack of Transportation (Non-Medical): Patient declined  Physical Activity: Not on file  Stress: Not on file  Social Connections: Unknown (02/01/2022)   Received from [facility]   Social Network    Social Network: Not on file  Intimate Partner Violence: Unknown (01/05/2022)   Received from [facility]   HITS    Physically Hurt: Not on file    Insult or Talk Down To: Not on file    Threaten Physical Harm: Not on file    Scream or Curse: Not on file    SUBJECTIVE  Review of Systems Constitutional: Patient ***denies any unintentional weight loss or change in strength lntegumentary: Patient ***denies any rashes or pruritus Eyes: Patient denies ***dry eyes ENT: Patient ***denies dry mouth Cardiovascular: Patient ***denies chest pain or syncope Respiratory: Patient ***denies shortness of breath Gastrointestinal: Patient ***denies  nausea, vomiting, constipation, or diarrhea Musculoskeletal: Patient ***denies muscle cramps or weakness Neurologic: Patient ***denies convulsions or seizures Psychiatric: Patient ***denies memory problems Allergic/Immunologic: Patient ***denies recent allergic reaction(s) Hematologic/Lymphatic: Patient denies bleeding tendencies Endocrine: Patient ***denies heat/cold intolerance  GU: As per HPI.  OBJECTIVE There were no vitals filed for this visit. There is no height or weight on file to calculate BMI.  Physical Examination Constitutional: ***No obvious distress; patient is ***non-toxic appearing  Cardiovascular: ***No visible lower extremity edema.  Respiratory: The patient does ***not have audible wheezing/stridor; respirations do ***not appear labored  Gastrointestinal: Abdomen ***non-distended Musculoskeletal: ***Normal ROM of UEs  Skin: ***No obvious rashes/open sores  Neurologic: CN 2-12 grossly ***intact Psychiatric: Answered questions ***appropriately with ***normal affect  Hematologic/Lymphatic/Immunologic: ***No obvious bruises or sites of spontaneous bleeding  UA: ***negative / *** WBC/hpf, *** RBC/hpf, bacteria (***) PVR: *** ml  ASSESSMENT No diagnosis found.  ***We reviewed recent imaging results; ***awaiting radiology results, appears to have ***no acute findings.  ***For stone prevention: Advised adequate hydration and we discussed option to consider low oxalate diet given that calcium oxalate is the most common type of stone. Handout provided about stone prevention diet.  ***For recurrent stone formers: We discussed option to proceed with 24 hour urinalysis (Litholink) for metabolic evaluation, which may help with targeted recommendations for dietary I medication therapies for stone prevention. Patient elected to ***proceed/ ***hold off.  Will plan to follow up in ***6 months / ***1 year with ***KUB ***RUS for stone surveillance or sooner if needed.  Pt  verbalized understanding and agreement. All questions were answered.  PLAN Advised the following: Maintain adequate fluid intake daily. Drink citrus juice (lemon, lime or orange juice) routinely. Low oxalate diet. No follow-ups on file.  No orders of the defined types were placed in this encounter.   It has been explained that the  patient is to follow regularly with their PCP in addition to all other providers involved in their care and to follow instructions provided by these respective offices. Patient advised to contact urology clinic if any urologic-pertaining questions, concerns, new symptoms or problems arise in the interim period.  There are no Patient Instructions on file for this visit.  Electronically signed by:  Donnita Falls, MSN, FNP-C, CUNP 06/22/2023 1:10 PM

## 2023-06-26 ENCOUNTER — Ambulatory Visit: Payer: Medicare HMO | Admitting: Urology

## 2023-06-26 DIAGNOSIS — N200 Calculus of kidney: Secondary | ICD-10-CM

## 2023-06-26 DIAGNOSIS — G35 Multiple sclerosis: Secondary | ICD-10-CM

## 2023-06-26 DIAGNOSIS — R3915 Urgency of urination: Secondary | ICD-10-CM

## 2023-06-26 DIAGNOSIS — R350 Frequency of micturition: Secondary | ICD-10-CM

## 2023-06-26 DIAGNOSIS — N3281 Overactive bladder: Secondary | ICD-10-CM

## 2023-07-03 ENCOUNTER — Other Ambulatory Visit: Payer: Self-pay | Admitting: Urology

## 2023-07-03 ENCOUNTER — Ambulatory Visit: Payer: Medicare HMO | Admitting: Podiatry

## 2023-07-03 ENCOUNTER — Encounter: Payer: Self-pay | Admitting: Podiatry

## 2023-07-03 DIAGNOSIS — N200 Calculus of kidney: Secondary | ICD-10-CM

## 2023-07-03 DIAGNOSIS — M7662 Achilles tendinitis, left leg: Secondary | ICD-10-CM | POA: Diagnosis not present

## 2023-07-03 NOTE — Progress Notes (Signed)
Subjective:  Patient ID: Denise Avila, female    DOB: 10-01-1976,   MRN: [id number]  Chief Complaint  Patient presents with   Foot Pain     Pt presents for Pain located at the lateral and back of heel.        47 y.o. female presents for follow-up of left achilles. Tendontiis. Relates medrol dose pack did not help. Has been stretching but the heel lifts have been aggravating. She recently had NCV for her common peroneal nerve entrapment and awaiting the results.  . Denies any other pedal complaints. Denies n/v/f/c.   Past Medical History:  Diagnosis Date   Abnormal Pap smear of cervix 12/2014   Acid reflux    Adrenal insufficiency (HCC)    Anxiety    Arthritis    Arthritis    Depression    Depression    Endometriosis    Fibroids    age 78   Fibromyalgia    High cholesterol    History of kidney stones    Liver disease    per pt report, will have biopsy Nov 08, 2017   Migraines    Pleurisy 02/2015   while hospitalized    Seizure (HCC)    on meds and no seizures in 2-3 years. Unknown etiology   Skin cancer (melanoma) (HCC)    Syncope and collapse    Uterine cancer (HCC)     Objective:  Physical Exam: Vascular: DP/PT pulses 2/4 bilateral. CFT <3 seconds. Normal hair growth on digits. No edema.  Skin. No lacerations or abrasions bilateral feet.  Musculoskeletal: MMT 5/5 bilateral lower extremities in DF, PF, Inversion and Eversion. Deceased ROM in DF of ankle joint. Tender to insertion of left achilles tendon. Pain with DF and PF of the ankle as well. Extreme sensitivity to area but no pain proximally along the tendon in watershed area.  Neurological: Sensation intact to light touch.   Assessment:   1. Tendonitis, Achilles, left       Plan:  Patient was evaluated and treated and all questions answered. -Xrays reviewed. Spurring noted to posterior calcaneus.  Reviewed previous notes and NCV studies -Discussed Achilles insertional tendonitis and treatment  options with patient.  -Continue stretching  -Discussed trying PT to work on more stretching and pain modalities.  -Will follow-up with NCV and EMG and neurology  -Discussed if no improvement will consider MRI/EPAT/PRP injections.  -Patient to return to office in 12 weeks for recheck   Louann Sjogren, DPM

## 2023-07-10 ENCOUNTER — Ambulatory Visit: Payer: Medicare HMO | Admitting: Urology

## 2023-07-10 ENCOUNTER — Encounter: Payer: Self-pay | Admitting: Urology

## 2023-07-10 VITALS — BP 113/79 | HR 102 | Temp 99.2°F

## 2023-07-10 DIAGNOSIS — N3941 Urge incontinence: Secondary | ICD-10-CM | POA: Diagnosis not present

## 2023-07-10 DIAGNOSIS — Z87442 Personal history of urinary calculi: Secondary | ICD-10-CM

## 2023-07-10 DIAGNOSIS — R350 Frequency of micturition: Secondary | ICD-10-CM

## 2023-07-10 DIAGNOSIS — R109 Unspecified abdominal pain: Secondary | ICD-10-CM

## 2023-07-10 DIAGNOSIS — H04123 Dry eye syndrome of bilateral lacrimal glands: Secondary | ICD-10-CM | POA: Insufficient documentation

## 2023-07-10 DIAGNOSIS — R3915 Urgency of urination: Secondary | ICD-10-CM

## 2023-07-10 DIAGNOSIS — N3281 Overactive bladder: Secondary | ICD-10-CM

## 2023-07-10 DIAGNOSIS — R1031 Right lower quadrant pain: Secondary | ICD-10-CM | POA: Diagnosis not present

## 2023-07-10 DIAGNOSIS — N200 Calculus of kidney: Secondary | ICD-10-CM

## 2023-07-10 LAB — URINALYSIS, ROUTINE W REFLEX MICROSCOPIC
Bilirubin, UA: NEGATIVE
Glucose, UA: NEGATIVE
Ketones, UA: NEGATIVE
Leukocytes,UA: NEGATIVE
Nitrite, UA: NEGATIVE
Protein,UA: NEGATIVE
RBC, UA: NEGATIVE
Specific Gravity, UA: 1.01 (ref 1.005–1.030)
Urobilinogen, Ur: 0.2 mg/dL (ref 0.2–1.0)
pH, UA: 6 (ref 5.0–7.5)

## 2023-07-10 LAB — BLADDER SCAN AMB NON-IMAGING: Scan Result: 0

## 2023-07-10 MED ORDER — GEMTESA 75 MG PO TABS: 1.0000 | ORAL_TABLET | Freq: Every day | ORAL | 11 refills | Status: DC

## 2023-07-10 NOTE — Progress Notes (Signed)
Name: Denise Avila DOB: 19-Jun-1976 MRN: [id number]  History of Present Illness: Denise Avila is a 47 y.o. female who presents today for follow up visit at [facility] [place]. - GU History: 1. Kidney stones.  - Has had prior ESWL, URS, and PCNL procedures.  - 11/03/2022: Stone analysis showed 100% calcium oxalate composition. - 02/28/2023: [facility]ink 24 hour urinalysis showed hypocitraturia, hyperoxaluria, hyperuricosuria. - 03/13/2023: RUS showed a 3.4 mm nonobstructive stone in the left kidney. - Taking sodium bicarb and Allopurinol for stone prevention. Could not tolerate UrocitK due to hyperkalemia.  - Taking Flomax daily. 2. Urinary urgency and frequency. - Taking Myrbetriq 50 mg daily (samples).  3. Prior recurrent UTIs. No positive urine culture results in past 12 months found per chart review.  At last visit on 05/01/2023: - The plan was: Increased Myrbetriq to 50 mg daily. Continue minimal caffeine intake. Maintain adequate fluid intake. Minimize animal protein intake. Increase citric acid intake.  Continue sodium bicarb and Allopurinol for stone prevention.  Continue Flomax daily. Return in about 8 weeks (around 06/26/2023) for UA, PVR, & f/u with Evette Georges NP.  Today: She reports that her symptoms improved while taking Myrbetriq (Mirabegron) 50 mg daily however that is cost-prohibitive for her to continue (currently taking via remaining drug samples).  She reports persistent / unchanged urinary frequency, urgency, and urge incontinence. Leaking multiple times perday on average; she has problems with using pads because they seem to cause yeast vaginitis.   She denies dysuria, gross hematuria, straining to void, or sensations of incomplete emptying.  Reports RLQ and right flank pain for the past couple weeks, which is intermittent and moderate in severity on average. Taking Tylenol as needed.   Fall Screening: Do you usually have a device to assist in  your mobility? No   Medications: Current Outpatient Medications  Medication Sig Dispense Refill   acetaminophen (TYLENOL) 500 MG tablet Take 1,000 mg by mouth every 6 (six) hours as needed for moderate pain.     albuterol (VENTOLIN HFA) 108 (90 Base) MCG/ACT inhaler Inhale 1-2 puffs into the lungs every 6 (six) hours as needed for shortness of breath or wheezing.     allopurinol (ZYLOPRIM) 300 MG tablet Take 1 tablet (300 mg total) by mouth at bedtime. 90 tablet 3   baclofen (LIORESAL) 10 MG tablet Take 10 mg by mouth every 8 (eight) hours.     celecoxib (CELEBREX) 200 MG capsule Take 200 mg by mouth in the morning.     clonazePAM (KLONOPIN) 1 MG tablet Take 1 mg by mouth at bedtime. Takes 1 1/2 QHS  0   diphenhydrAMINE (BENADRYL) 25 MG tablet Take 25 mg by mouth every 6 (six) hours as needed (take with for migraines).     ezetimibe (ZETIA) 10 MG tablet Take 10 mg by mouth in the morning.     gabapentin (NEURONTIN) 800 MG tablet Take 2 tablets (1,600 mg total) by mouth 2 (two) times daily. 30 tablet 8   lamoTRIgine (LAMICTAL) 200 MG tablet Take 200 mg by mouth at bedtime. Takes 150 X 2     Multiple Vitamin (MULTIVITAMIN WITH MINERALS) TABS tablet Take 1 tablet by mouth in the morning. One A Day     omeprazole (PRILOSEC) 40 MG capsule Take 40 mg by mouth 2 (two) times daily.     ondansetron (ZOFRAN) 4 MG tablet Take 1 tablet (4 mg total) by mouth daily as needed for nausea or vomiting. 30 tablet 1   Polyethyl  Glycol-Propyl Glycol (SYSTANE OP) Place 1 drop into both eyes in the morning, at noon, and at bedtime.     prochlorperazine (COMPAZINE) 10 MG tablet Take 10 mg by mouth every 6 (six) hours as needed (migraines (nausea)).     promethazine (PHENERGAN) 25 MG tablet Take 1 tablet (25 mg total) by mouth 4 (four) times daily as needed for nausea or vomiting. 30 tablet 0   rosuvastatin (CRESTOR) 5 MG tablet Take 5 mg by mouth at bedtime.     Simethicone (GAS RELIEF) 250 MG CAPS Take 250 mg by  mouth 3 (three) times daily as needed (gas/bloating).     sodium bicarbonate 650 MG tablet Take 1 tablet (650 mg total) by mouth 2 (two) times daily. 60 tablet 11   tamsulosin (FLOMAX) 0.4 MG CAPS capsule TAKE 1 CAPSULE EVERY DAY AFTER SUPPER 90 capsule 3   tiZANidine (ZANAFLEX) 2 MG tablet Take 2 mg by mouth every 8 (eight) hours.     Vibegron (GEMTESA) 75 MG TABS Take 1 tablet (75 mg total) by mouth daily. 30 tablet 11   methylphenidate (RITALIN) 5 MG tablet Take 5 mg by mouth at bedtime.     No current facility-administered medications for this visit.    Allergies: Allergies  Allergen Reactions   Penicillins Swelling and Rash    Rash all over [place] and lip swelling. Has patient had a PCN reaction causing immediate rash, facial/tongue/throat swelling, SOB or lightheadedness with hypotension: Yes Has patient had a PCN reaction causing severe rash involving mucus membranes or skin necrosis: Yes Has patient had a PCN reaction that required hospitalization: No If all of the above answers are "NO", then may proceed with Cephalosporin use.   Nortriptyline Other (See Comments)    Severe chest pain     Nsaids Other (See Comments)    Advised not to take due to gastric sleeve    Amoxicillin Rash    Rash all over [place]    Ciprofloxacin Diarrhea and Nausea Only    Past Medical History:  Diagnosis Date   Abnormal Pap smear of cervix 12/2014   Acid reflux    Adrenal insufficiency (HCC)    Anxiety    Arthritis    Arthritis    Depression    Depression    Endometriosis    Fibroids    age 22   Fibromyalgia    High cholesterol    History of kidney stones    Liver disease    per pt report, will have biopsy Nov 08, 2017   Migraines    Pleurisy 02/2015   while hospitalized    Seizure (HCC)    on meds and no seizures in 2-3 years. Unknown etiology   Skin cancer (melanoma) (HCC)    Syncope and collapse    Uterine cancer (HCC)    Past Surgical History:  Procedure Laterality Date    ABDOMINAL HYSTERECTOMY     ABLATION     uterine   COLECTOMY     CYSTOSCOPY W/ URETERAL STENT PLACEMENT Left 02/02/2015   Procedure: CYSTOSCOPY WITH RETROGRADE PYELOGRAM/URETERAL STENT PLACEMENT;  Surgeon: Jerilee Field, MD;  Location: [facility];  Service: Urology;  Laterality: Left;   CYSTOSCOPY WITH RETROGRADE PYELOGRAM, URETEROSCOPY AND STENT PLACEMENT Bilateral 01/20/2022   Procedure: CYSTOSCOPY WITH RETROGRADE PYELOGRAM, URETEROSCOPY AND STENT PLACEMENT;  Surgeon: Malen Gauze, MD;  Location: [facility];  Service: Urology;  Laterality: Bilateral;   CYSTOSCOPY WITH RETROGRADE PYELOGRAM, URETEROSCOPY AND STENT PLACEMENT Left 11/03/2022   Procedure:  CYSTOSCOPY WITH RETROGRADE PYELOGRAM, URETEROSCOPY AND STENT PLACEMENT;  Surgeon: Malen Gauze, MD;  Location: [facility];  Service: Urology;  Laterality: Left;  pt knows to arrive at 9:00   CYSTOSCOPY WITH URETEROSCOPY AND STENT PLACEMENT Left 02/13/2015   Procedure: LEFT URETEROSCOPY WITH HOLMIUM LASER AND STENT PLACEMENT;  Surgeon: Jerilee Field, MD;  Location: [facility];  Service: Urology;  Laterality: Left;   DILATION AND CURETTAGE OF UTERUS     ESOPHAGOGASTRODUODENOSCOPY ENDOSCOPY  10/11/2017   @ [facility]; BIOPSY OF LOWER INTESTINE   EXTRACORPOREAL SHOCK WAVE LITHOTRIPSY Left 09/13/2022   Procedure: EXTRACORPOREAL SHOCK WAVE LITHOTRIPSY (ESWL);  Surgeon: Malen Gauze, MD;  Location: [facility];  Service: Urology;  Laterality: Left;   HOLMIUM LASER APPLICATION N/A 02/13/2015   Procedure: HOLMIUM LASER APPLICATION;  Surgeon: Jerilee Field, MD;  Location: [facility];  Service: Urology;  Laterality: N/A;   IR NEPHROSTOMY PLACEMENT RIGHT  07/05/2021   KNEE SURGERY Bilateral 2019   realignment of knees   LAPAROSCOPIC GASTRIC SLEEVE RESECTION     LAPAROSCOPY     x 4   MELANOMA EXCISION  2000   melanoma removed from face, basal cell removed from nose   NEPHROLITHOTOMY Right 07/08/2021   Procedure: NEPHROLITHOTOMY PERCUTANEOUS- with stent  placement;  Surgeon: Malen Gauze, MD;  Location: [facility];  Service: Urology;  Laterality: Right;   REPAIR VAGINAL CUFF N/A 08/21/2015   Procedure: REPAIR VAGINAL CUFF, EXAM UNDER ANESTHESIA;  Surgeon: Adolphus Birchwood, MD;  Location: [facility];  Service: Gynecology;  Laterality: N/A;   ROBOTIC ASSISTED TOTAL HYSTERECTOMY WITH BILATERAL SALPINGO OOPHERECTOMY Bilateral 05/21/2015   Procedure: XI ROBOTIC ASSISTED TOTAL HYSTERECTOMY WITH BILATERAL SALPINGO OOPHORECTOMY;  Surgeon: Adolphus Birchwood, MD;  Location: [facility];  Service: Gynecology;  Laterality: Bilateral;   STONE EXTRACTION WITH BASKET Bilateral 01/20/2022   Procedure: STONE EXTRACTION WITH BASKET;  Surgeon: Malen Gauze, MD;  Location: [facility];  Service: Urology;  Laterality: Bilateral;   STONE EXTRACTION WITH BASKET Left 11/03/2022   Procedure: STONE EXTRACTION WITH BASKET;  Surgeon: Malen Gauze, MD;  Location: [facility];  Service: Urology;  Laterality: Left;   Family History  Problem Relation Age of Onset   Hypertension Mother    Melanoma Mother    Breast cancer Mother 61   Cancer - Other Father        liver   Diabetes Mellitus II Maternal Grandmother    Heart disease Maternal Grandmother    Breast cancer Maternal Grandmother    Cancer - Lung Maternal Grandfather    Osteoporosis Paternal Grandmother    Melanoma Paternal Grandfather    Social History   Socioeconomic History   Marital status: Married    Spouse name: Not on file   Number of children: 1   Years of education: Not on file   Highest education level: Associate degree: occupational, Scientist, product/process development, or vocational program  Occupational History   Not on file  Tobacco Use   Smoking status: Never   Smokeless tobacco: Never  Vaping Use   Vaping status: Never Used  Substance and Sexual Activity   Alcohol use: No   Drug use: No   Sexual activity: Yes  Other Topics Concern   Not on file  Social History Narrative   Lives at home with family. Currently living with  mother to help take care of her    Right handed   No Caffeine   Social Determinants of Health   Financial Resource Strain: Patient Declined (04/10/2023)  Received from [facility]   Overall Financial Resource Strain (CARDIA)    Difficulty of Paying Living Expenses: Patient declined  Food Insecurity: Patient Declined (04/10/2023)   Received from [facility]   Hunger Vital Sign    Worried About Running Out of Food in the Last Year: Patient declined    Ran Out of Food in the Last Year: Patient declined  Transportation Needs: Patient Declined (04/10/2023)   Received from [facility] - Transportation    In the past 12 months, has lack of transportation kept you from medical appointments or from getting medications?: Patient declined    Lack of Transportation (Non-Medical): Patient declined  Physical Activity: Not on file  Stress: Not on file  Social Connections: Unknown (02/01/2022)   Received from [facility], [facility]   Social Network    Social Network: Not on file  Intimate Partner Violence: Unknown (01/05/2022)   Received from [facility], [facility]   HITS    Physically Hurt: Not on file    Insult or Talk Down To: Not on file    Threaten Physical Harm: Not on file    Scream or Curse: Not on file    SUBJECTIVE  Review of Systems Constitutional: Patient denies any unintentional weight loss or change in strength lntegumentary: Patient denies any rashes or pruritus Eyes: Patient reports dry eyes ENT: Patient reports dry mouth Cardiovascular: Patient denies chest pain or syncope Respiratory: Patient denies shortness of breath Gastrointestinal: Patient denies nausea, vomiting, constipation, or diarrhea Musculoskeletal: Patient denies muscle cramps or weakness Neurologic: Patient denies convulsions or seizures Psychiatric: Patient denies memory problems Allergic/Immunologic: Patient denies recent allergic  reaction(s) Hematologic/Lymphatic: Patient denies bleeding tendencies Endocrine: Patient denies heat/cold intolerance  GU: As per HPI.  OBJECTIVE Vitals:   07/10/23 1333  BP: 113/79  Pulse: (!) 102  Temp: 99.2 F (37.3 C)   There is no height or weight on file to calculate BMI.  Physical Examination Constitutional: No obvious distress; patient is non-toxic appearing  Cardiovascular: No visible lower extremity edema.  Respiratory: The patient does not have audible wheezing/stridor; respirations do not appear labored  Gastrointestinal: Abdomen non-distended Musculoskeletal: Normal ROM of UEs  Skin: No obvious rashes/open sores  Neurologic: CN 2-12 grossly intact Psychiatric: Answered questions appropriately with normal affect  Hematologic/Lymphatic/Immunologic: No obvious bruises or sites of spontaneous bleeding  UA: negative  PVR: 0 ml  ASSESSMENT Kidney stones - Plan: Urinalysis, Routine w reflex microscopic, BLADDER SCAN AMB NON-IMAGING, DG Abd 1 View  Urinary frequency - Plan: Urinalysis, Routine w reflex microscopic, BLADDER SCAN AMB NON-IMAGING, Vibegron (GEMTESA) 75 MG TABS  Right flank pain - Plan: DG Abd 1 View  RLQ abdominal pain - Plan: DG Abd 1 View  Urinary urgency - Plan: Vibegron (GEMTESA) 75 MG TABS  OAB (overactive bladder) - Plan: Vibegron (GEMTESA) 75 MG TABS  Dry mouth and eyes  For OAB:  - Myrbetriq cost prohibitive and therefore discontinued. - Advised Gemtesa 75 mg daily as alternative. Samples provided. - Not a safe candidate for anticholinergics due to pre-existing dry mouth & dry eyes along with risk for exacerbating her multiple sclerosus. - Discussed possible future consideration of 3rd line therapies such as bladder Botox.  For right flank / RLQ pain with history of stones: - Agreed to obtain KUB this week to assess current stone burden. She has no right-sided stones per RUS in June 2024.  Will plan to follow up in 6  weeks for OAB  symptom recheck.  Pt verbalized understanding and agreement. All questions were answered.  PLAN Advised the following: 1. Gemtesa 75 mg daily. 2. KUB this week. 3. Continue minimal caffeine intake. 4. Continue sodium bicarb and Allopurinol for stone prevention.  5. Continue Flomax daily. 6. Return in about 6 weeks (around 08/21/2023) for UA, PVR, & f/u with Evette Georges NP.  Orders Placed This Encounter  Procedures   DG Abd 1 View    Standing Status:   Future    Standing Expiration Date:   07/09/2024    Order Specific Question:   Reason for Exam (SYMPTOM  OR DIAGNOSIS REQUIRED)    Answer:   kidney stone    Order Specific Question:   Is patient pregnant?    Answer:   Unknown (Please Explain)    Order Specific Question:   Preferred imaging location?    Answer:   [facility]   Urinalysis, Routine w reflex microscopic   BLADDER SCAN AMB NON-IMAGING    It has been explained that the patient is to follow regularly with their PCP in addition to all other providers involved in their care and to follow instructions provided by these respective offices. Patient advised to contact urology clinic if any urologic-pertaining questions, concerns, new symptoms or problems arise in the interim period.  Patient Instructions       Overactive bladder (OAB) overview for patients:  Symptoms may include: urinary urgency ("gotta go" feeling) urinary frequency (voiding >8 times per day) night time urination (nocturia) urge incontinence of urine (UUI)  While we do not know the exact etiology of OAB, several treatment options exist including:  Behavioral therapy: Reducing fluid intake Decreasing bladder stimulants (such as caffeine) and irritants (such as acidic food, spicy foods, alcohol) Urge suppression strategies Bladder retraining via timed voiding  Pelvic floor physical therapy  Medication(s) - can use one or both of the drug classes below. Anticholinergic / antimuscarinic  medications:  Mechanism of action: Activate M3 receptors to reduce detrusor stimulation and increase bladder capacity   (parasympathetic nervous system). Effect: Relaxes the bladder to decrease overactivity, increase bladder storage capacity, and increase time between voids. Onset: Slow acting (may take 8-12 weeks to determine efficacy). Medications include: Vesicare (Solifenacin), Ditropan (Oxybutynin), Detrol (Tolterodine), Toviaz (Fesoterodine), Sanctura (Trospium), Urispas (Flavoxate), Enablex (Darifenacin), Bentyl (Dicyclomine), Levsin (Hyoscyamine ). Potential side effects include but are not limited to: Dry eyes, dry mouth, constipation, cognitive impairment, dementia risk with long term use, and urinary retention/ incomplete bladder emptying. Insurance companies generally prefer for patients to try 1-2 anticholinergic / antimuscarinic medications first due to low cost. Some exceptions are made based on patient-specific comorbidities / risk factors. Beta-3 agonist medications: Mechanism of action: Stimulates selective B3 adrenergic receptors to cause smooth muscle bladder relaxation (sympathetic nervous system). Effect: Relaxes the bladder to decrease overactivity, increase bladder storage capacity, and increase time between voids. Onset: Slow acting (may take 8-12 weeks to determine efficacy). Medications include: Myrbetriq (Mirabegron) and Vibegron [name]). Potential side effects include but are not limited to: urinary retention / incomplete bladder emptying and elevated blood pressure (more likely to occur in individuals with pre-existing uncontrolled hypertension). These medications tend to be more expensive than the anticholinergic / antimuscarinic medications.   For patients with refractory OAB (if the above treatment options have been unsuccessful): Posterior tibial nerve stimulation (PTNS). Small acupuncture-type needle inserted near ankle with electric current to stimulate  bladder via posterior tibial nerve pathway. Initially requires 12 weekly in-office treatments  lasting 30 minutes each; followed by monthly in-office treatments lasting 30 minutes each for 1 year.  Bladder Botox injections. How it is done: Typically done via in-office cystoscopy; sometimes done in the OR depending on the situation. The bladder is numbed with lidocaine instilled via a catheter. Then the urologist injects Botox into the bladder muscle wall in about 20 locations. Causes local paralysis of the bladder muscle at the injection sites to reduce bladder muscle overactivity / spasms. The effect lasts for approximately 6 months and cannot be reversed once performed. Risks may included but are not limited to: infection, incomplete bladder emptying/ urinary retention, short term need for self-catheterization or indwelling catheter, and need for repeat therapy. There is a 5-12% chance of needing to catheterize with Botox - that usually resolves in a few months as the Botox wears off. Typically Botox injections would need to be repeated every 3-12 months since this is not a permanent therapy.  Sacral neuromodulation trial (Medtronic lnterStim or Axonics implant). Sacral neuromodulation is FDA-approved for uncontrolled urinary urgency, urinary frequency, urinary urge incontinence, non-obstructive urinary retention, or fecal incontinence. It is not FDA-approved as a treatment for pain. The goal of this therapy is at least a 50% improvement in symptoms. It is NOT realistic to expect a 100% cure. This is a a 2-step outpatient procedure. After a successful test period, a permanent wire and generator are placed in the OR. We discussed the risk of infection. We reviewed the fact that about 30% of patients fail the test phase and are not candidates for permanent generator placement. During the 1-2 week trial phase, symptoms are documented by the patient to determine response. If patient gets at least a 50%  improvement in symptoms, they may then proceed with Step 2. Step 1: Trial lead placement. Per physician discretion, may done one of two ways: Percutaneous nerve evaluation (PNE) in the [facility] urology office. Performed by urologist under local anesthesia (numbing the area with lidocaine) using a spinal needle for placement of test wire, which usually stays in place for 5-7 days to determine therapy response. Test lead placement in OR under anesthesia. Usually stays in place 2 weeks to determine therapy response. > Step 2: Permanent implantation of sacral neuromodulation device, which is performed in the OR.  Sacral neuromodulation implants: All are conditionally MRI safe. Manufacturer: Medtronic Website: [employer]gl therapy/right-for-you.html Options: lnterStim X: Non-rechargeable. The battery lasts 10 years on average. lnterStim Micro: Rechargeable. The battery lasts 15 years on average and must be charged routinely. Approximately 50% smaller implant than lnterStim X implant.  Manufacturer: Axonics Website: Findrealrelief.axonics.com Options: Non-rechargeable (Axonics F15): The battery lasts 15 years on average. Rechargeable (Axonics R20): The battery lasts 20 years on average and must be charged in office for about 1 hour every 6-10 months on average. Approximately 50% smaller implant than Axonics non-rechargeable implant.  Note: Generally the rechargeable devices are only advised for very small or thin patients who may not have sufficient adipose tissue to comfortably overlay the implanted device.  Suprapubic catheter (SP tube) placement. Only done in severely refractory OAB when all other options have failed or are not a viable treatment choice depending on patient factors. Involves placement of a catheter through the lower abdomen into the bladder to continuously drain the bladder into an external  collection bag, which patient can then empty at their convenience every few hours. Done via an outpatient surgical procedure in the OR under anesthesia. Risks may included but are not limited to: surgical site pain,  infections, skin irritation / breakdown, chronic bacteriuria, symptomatic UTls. The SP tube must stay in place continuously. This is a reversible procedure however - the insertion site will close if catheter is removed for more than a few hours. The SP tube must be exchanged routinely every 4 weeks to prevent the catheter from becoming clogged with sediment. SP tube exchanges are typically performed at a urology nurse visit or by a home health nurse.   Electronically signed by:  Donnita Falls, MSN, FNP-C, CUNP 07/10/2023 2:07 PM

## 2023-07-10 NOTE — Patient Instructions (Signed)
Overactive bladder (OAB) overview for patients:  Symptoms may include: urinary urgency ("gotta go" feeling) urinary frequency (voiding >8 times per day) night time urination (nocturia) urge incontinence of urine (UUI)  While we do not know the exact etiology of OAB, several treatment options exist including:  Behavioral therapy: Reducing fluid intake Decreasing bladder stimulants (such as caffeine) and irritants (such as acidic food, spicy foods, alcohol) Urge suppression strategies Bladder retraining via timed voiding  Pelvic floor physical therapy  Medication(s) - can use one or both of the drug classes below. Anticholinergic / antimuscarinic medications:  Mechanism of action: Activate M3 receptors to reduce detrusor stimulation and increase bladder capacity  (parasympathetic nervous system). Effect: Relaxes the bladder to decrease overactivity, increase bladder storage capacity, and increase time between voids. Onset: Slow acting (may take 8-12 weeks to determine efficacy). Medications include: Vesicare (Solifenacin), Ditropan (Oxybutynin), Detrol (Tolterodine), Toviaz (Fesoterodine), Sanctura (Trospium), Urispas (Flavoxate), Enablex (Darifenacin), Bentyl (Dicyclomine), Levsin (Hyoscyamine ). Potential side effects include but are not limited to: Dry eyes, dry mouth, constipation, cognitive impairment, dementia risk with long term use, and urinary retention/ incomplete bladder emptying. Insurance companies generally prefer for patients to try 1-2 anticholinergic / antimuscarinic medications first due to low cost. Some exceptions are made based on patient-specific comorbidities / risk factors. Beta-3 agonist medications: Mechanism of action: Stimulates selective B3 adrenergic receptors to cause smooth muscle bladder relaxation (sympathetic nervous system). Effect: Relaxes the bladder to decrease overactivity, increase bladder storage capacity, and increase time  between voids. Onset: Slow acting (may take 8-12 weeks to determine efficacy). Medications include: Myrbetriq (Mirabegron) and Vibegron Denise Avila). Potential side effects include but are not limited to: urinary retention / incomplete bladder emptying and elevated blood pressure (more likely to occur in individuals with pre-existing uncontrolled hypertension). These medications tend to be more expensive than the anticholinergic / antimuscarinic medications.   For patients with refractory OAB (if the above treatment options have been unsuccessful): Posterior tibial nerve stimulation (PTNS). Small acupuncture-type needle inserted near ankle with electric current to stimulate bladder via posterior tibial nerve pathway. Initially requires 12 weekly in-office treatments lasting 30 minutes each; followed by monthly in-office treatments lasting 30 minutes each for 1 year.  Bladder Botox injections. How it is done: Typically done via in-office cystoscopy; sometimes done in the OR depending on the situation. The bladder is numbed with lidocaine instilled via a catheter. Then the urologist injects Botox into the bladder muscle wall in about 20 locations. Causes local paralysis of the bladder muscle at the injection sites to reduce bladder muscle overactivity / spasms. The effect lasts for approximately 6 months and cannot be reversed once performed. Risks may included but are not limited to: infection, incomplete bladder emptying/ urinary retention, short term need for self-catheterization or indwelling catheter, and need for repeat therapy. There is a 5-12% chance of needing to catheterize with Botox - that usually resolves in a few months as the Botox wears off. Typically Botox injections would need to be repeated every 3-12 months since this is not a permanent therapy.  Sacral neuromodulation trial ([employer]tronic lnterStim or Axonics implant). Sacral neuromodulation is FDA-approved for uncontrolled urinary  urgency, urinary frequency, urinary urge incontinence, non-obstructive urinary retention, or fecal incontinence. It is not FDA-approved as a treatment for pain. The goal of this therapy is at least a 50% improvement in symptoms. It is NOT realistic to expect a 100% cure. This is a a 2-step outpatient procedure. After a successful  test period, a permanent wire and generator are placed in the OR. We discussed the risk of infection. We reviewed the fact that about 30% of patients fail the test phase and are not candidates for permanent generator placement. During the 1-2 week trial phase, symptoms are documented by the patient to determine response. If patient gets at least a 50% improvement in symptoms, they may then proceed with Step 2. Step 1: Trial lead placement. Per physician discretion, may done one of two ways: Percutaneous nerve evaluation (PNE) in the [facility] urology office. Performed by urologist under local anesthesia (numbing the area with lidocaine) using a spinal needle for placement of test wire, which usually stays in place for 5-7 days to determine therapy response. Test lead placement in OR under anesthesia. Usually stays in place 2 weeks to determine therapy response. > Step 2: Permanent implantation of sacral neuromodulation device, which is performed in the OR.  Sacral neuromodulation implants: All are conditionally MRI safe. Manufacturer: [employer]tr[employer] Website: [employer] therapy/right-for-you.[email or web address] Options: lnterStim X: Non-rechargeable. The battery lasts 10 years on average. lnterStim Micro: Rechargeable. The battery lasts 15 years on average and must be charged routinely. Approximately 50% smaller implant than lnterStim X implant.  Manufacturer: Axonics Website: [employer]axonics[employer]com Options: Non-rechargeable (Axonics F15): The battery lasts 15 years on average. Rechargeable (Axonics R20):  The battery lasts 20 years on average and must be charged in office for about 1 hour every 6-10 months on average. Approximately 50% smaller implant than Axonics non-rechargeable implant.  Note: Generally the rechargeable devices are only advised for very small or thin patients who may not have sufficient adipose tissue to comfortably overlay the implanted device.  Suprapubic catheter (SP tube) placement. Only done in severely refractory OAB when all other options have failed or are not a viable treatment choice depending on patient factors. Involves placement of a catheter through the lower abdomen into the bladder to continuously drain the bladder into an external collection bag, which patient can then empty at their convenience every few hours. Done via an outpatient surgical procedure in the OR under anesthesia. Risks may included but are not limited to: surgical site pain, infections, skin irritation / breakdown, chronic bacteriuria, symptomatic UTls. The SP tube must stay in place continuously. This is a reversible procedure however - the insertion site will close if catheter is removed for more than a few hours. The SP tube must be exchanged routinely every 4 weeks to prevent the catheter from becoming clogged with sediment. SP tube exchanges are typically performed at a urology nurse visit or by a home health nurse.

## 2023-07-20 ENCOUNTER — Ambulatory Visit
Admission: RE | Admit: 2023-07-20 | Discharge: 2023-07-20 | Disposition: A | Discharge status: Home / Self Care | Payer: Medicare HMO | Source: Ambulatory Visit | Attending: Physician Assistant | Admitting: Physician Assistant

## 2023-07-20 DIAGNOSIS — N649 Disorder of breast, unspecified: Secondary | ICD-10-CM

## 2023-07-26 ENCOUNTER — Ambulatory Visit (HOSPITAL_COMMUNITY): Payer: Medicare HMO

## 2023-08-11 ENCOUNTER — Encounter: Payer: Self-pay | Admitting: Urology

## 2023-08-11 ENCOUNTER — Other Ambulatory Visit: Payer: Self-pay

## 2023-08-11 MED ORDER — FLUCONAZOLE 100 MG PO TABS: 100.0000 mg | ORAL_TABLET | Freq: Every day | ORAL | 0 refills | Status: DC

## 2023-08-11 NOTE — Telephone Encounter (Signed)
Please see pt request below.

## 2023-08-11 NOTE — Progress Notes (Unsigned)
Samples sent back  for mirabegron 25 mg bid  not picked up after 2.5 months

## 2023-08-17 ENCOUNTER — Ambulatory Visit (HOSPITAL_COMMUNITY): Payer: Medicare HMO | Admitting: Physical Therapy

## 2023-08-21 ENCOUNTER — Ambulatory Visit: Payer: Medicare HMO | Admitting: Urology

## 2023-08-24 ENCOUNTER — Ambulatory Visit (HOSPITAL_COMMUNITY): Payer: Medicare HMO

## 2023-08-25 ENCOUNTER — Ambulatory Visit (HOSPITAL_COMMUNITY): Payer: Medicare HMO

## 2023-09-04 ENCOUNTER — Ambulatory Visit (HOSPITAL_COMMUNITY): Payer: Medicare HMO

## 2023-09-12 NOTE — Progress Notes (Unsigned)
Name: Denise Avila DOB: [birth year] MRN: [id number]  History of Present Illness: Denise Avila is a 47 y.o. female who presents today for follow up visit at [facility] .  - GU history: 1. OAB with urinary frequency and urgency. - Not a safe candidate for anticholinergics due to pre-existing dry mouth & dry eyes along with risk for exacerbating her multiple sclerosus.  - Previously took Myrbetriq 50 mg daily (samples) but that was cost prohibitive. 2. Kidney stones.  - Has had prior ESWL, URS, and PCNL procedures.  - 11/03/2022: Stone analysis showed 100% calcium oxalate composition. - 02/28/2023: Litholink 24 hour urinalysis showed hypocitraturia, hyperoxaluria, hyperuricosuria. - 03/13/2023: RUS showed a 3.4 mm nonobstructive stone in the left kidney. - Taking sodium bicarb and Allopurinol for stone prevention. Could not tolerate UrocitK due to hyperkalemia.  - Taking Flomax daily. 3. Prior recurrent UTIs. No positive urine culture results in past 12 months found per chart review.  At last visit on 07/10/2023: The plan was: 1. Gemtesa 75 mg daily. 2. KUB  3. Continue minimal caffeine intake. 4. Continue sodium bicarb and Allopurinol for stone prevention.  5. Continue Flomax daily. 6. Return in about 6 weeks (around 08/21/2023) for UA, PVR, & f/u with Evette Georges NP  Today: KUB today: Awaiting radiology read; no stones appreciated per provider interpretation.  She reports no significant difference with Gemtesa compared to Myrbetriq. The [name] is cost prohibitive, as was Myrbetriq. She reports persistent / unchanged urinary frequency, nocturia, urgency, and urge incontinence. Continues wearing pads and having leaking multiple times per day.  She reports seeing some blood when wiping after urinating 2 weeks ago which lasted for a couple days; denies gross hematuria.   She denies straining to void or sensations of incomplete emptying.  She has had a two vaginal  yeast infections over the past few months. Took Diflucan but it recurred, at which point she used Monistat with improvement.   She denies recent episode of stone pain / passage. She reports intermittent right flank pain and RLQ abdominal pain over the past few weeks which is mild-to-moderate in severity.   Fall Screening: Do you usually have a device to assist in your mobility? No   Medications: Current Outpatient Medications  Medication Sig Dispense Refill   acetaminophen (TYLENOL) 500 MG tablet Take 1,000 mg by mouth every 6 (six) hours as needed for moderate pain.     albuterol (VENTOLIN HFA) 108 (90 Base) MCG/ACT inhaler Inhale 1-2 puffs into the lungs every 6 (six) hours as needed for shortness of breath or wheezing.     allopurinol (ZYLOPRIM) 300 MG tablet Take 1 tablet (300 mg total) by mouth at bedtime. 90 tablet 3   baclofen (LIORESAL) 10 MG tablet Take 10 mg by mouth every 8 (eight) hours.     celecoxib (CELEBREX) 200 MG capsule Take 200 mg by mouth in the morning.     clonazePAM (KLONOPIN) 1 MG tablet Take 1 mg by mouth at bedtime. Takes 1 1/2 QHS  0   diphenhydrAMINE (BENADRYL) 25 MG tablet Take 25 mg by mouth every 6 (six) hours as needed (take with for migraines).     ezetimibe (ZETIA) 10 MG tablet Take 10 mg by mouth in the morning.     fluconazole (DIFLUCAN) 100 MG tablet Take 1 tablet (100 mg total) by mouth daily. 7 tablet 0   gabapentin (NEURONTIN) 800 MG tablet Take 2 tablets (1,600 mg total) by mouth 2 (two) times daily. 30 tablet  8   lamoTRIgine (LAMICTAL) 200 MG tablet Take 200 mg by mouth at bedtime. Takes 150 X 2     Multiple Vitamin (MULTIVITAMIN WITH MINERALS) TABS tablet Take 1 tablet by mouth in the morning. One A Day     omeprazole (PRILOSEC) 40 MG capsule Take 40 mg by mouth 2 (two) times daily.     ondansetron (ZOFRAN) 4 MG tablet Take 1 tablet (4 mg total) by mouth daily as needed for nausea or vomiting. 30 tablet 1   Polyethyl Glycol-Propyl Glycol (SYSTANE  OP) Place 1 drop into both eyes in the morning, at noon, and at bedtime.     prochlorperazine (COMPAZINE) 10 MG tablet Take 10 mg by mouth every 6 (six) hours as needed (migraines (nausea)).     promethazine (PHENERGAN) 25 MG tablet Take 1 tablet (25 mg total) by mouth 4 (four) times daily as needed for nausea or vomiting. 30 tablet 0   rosuvastatin (CRESTOR) 5 MG tablet Take 5 mg by mouth at bedtime.     Simethicone (GAS RELIEF) 250 MG CAPS Take 250 mg by mouth 3 (three) times daily as needed (gas/bloating).     sodium bicarbonate 650 MG tablet Take 1 tablet (650 mg total) by mouth 2 (two) times daily. 60 tablet 11   tamsulosin (FLOMAX) 0.4 MG CAPS capsule TAKE 1 CAPSULE EVERY DAY AFTER SUPPER 90 capsule 3   tiZANidine (ZANAFLEX) 2 MG tablet Take 2 mg by mouth every 8 (eight) hours.     Vibegron (GEMTESA) 75 MG TABS Take 1 tablet (75 mg total) by mouth daily. 30 tablet 11   methylphenidate (RITALIN) 5 MG tablet Take 5 mg by mouth at bedtime.     No current facility-administered medications for this visit.    Allergies: Allergies  Allergen Reactions   Penicillins Swelling and Rash    Rash all over [place] and lip swelling. Has patient had a PCN reaction causing immediate rash, facial/tongue/throat swelling, SOB or lightheadedness with hypotension: Yes Has patient had a PCN reaction causing severe rash involving mucus membranes or skin necrosis: Yes Has patient had a PCN reaction that required hospitalization: No If all of the above answers are "NO", then may proceed with Cephalosporin use.   Nortriptyline Other (See Comments)    Severe chest pain     Nsaids Other (See Comments)    Advised not to take due to gastric sleeve    Amoxicillin Rash    Rash all over [place]    Ciprofloxacin Diarrhea and Nausea Only    Past Medical History:  Diagnosis Date   Abnormal Pap smear of cervix 12/2014   Acid reflux    Adrenal insufficiency (HCC)    Anxiety    Arthritis    Arthritis     Depression    Depression    Endometriosis    Fibroids    age 69   Fibromyalgia    High cholesterol    History of kidney stones    Liver disease    per pt report, will have biopsy Nov 08, 2017   Migraines    Pleurisy 02/2015   while hospitalized    Seizure (HCC)    on meds and no seizures in 2-3 years. Unknown etiology   Skin cancer (melanoma) (HCC)    Syncope and collapse    Uterine cancer [facility])    Past Surgical History:  Procedure Laterality Date   ABDOMINAL HYSTERECTOMY     ABLATION     uterine   COLECTOMY  CYSTOSCOPY W/ URETERAL STENT PLACEMENT Left 02/02/2015   Procedure: CYSTOSCOPY WITH RETROGRADE PYELOGRAM/URETERAL STENT PLACEMENT;  Surgeon: Jerilee Field, MD;  Location: [facility];  Service: Urology;  Laterality: Left;   CYSTOSCOPY WITH RETROGRADE PYELOGRAM, URETEROSCOPY AND STENT PLACEMENT Bilateral 01/20/2022   Procedure: CYSTOSCOPY WITH RETROGRADE PYELOGRAM, URETEROSCOPY AND STENT PLACEMENT;  Surgeon: Malen Gauze, MD;  Location: [facility];  Service: Urology;  Laterality: Bilateral;   CYSTOSCOPY WITH RETROGRADE PYELOGRAM, URETEROSCOPY AND STENT PLACEMENT Left 11/03/2022   Procedure: CYSTOSCOPY WITH RETROGRADE PYELOGRAM, URETEROSCOPY AND STENT PLACEMENT;  Surgeon: Malen Gauze, MD;  Location: [facility];  Service: Urology;  Laterality: Left;  pt knows to arrive at 9:00   CYSTOSCOPY WITH URETEROSCOPY AND STENT PLACEMENT Left 02/13/2015   Procedure: LEFT URETEROSCOPY WITH HOLMIUM LASER AND STENT PLACEMENT;  Surgeon: Jerilee Field, MD;  Location: [facility];  Service: Urology;  Laterality: Left;   DILATION AND CURETTAGE OF UTERUS     ESOPHAGOGASTRODUODENOSCOPY ENDOSCOPY  10/11/2017   @ [facility]; BIOPSY OF LOWER INTESTINE   EXTRACORPOREAL SHOCK WAVE LITHOTRIPSY Left 09/13/2022   Procedure: EXTRACORPOREAL SHOCK WAVE LITHOTRIPSY (ESWL);  Surgeon: Malen Gauze, MD;  Location: [facility];  Service: Urology;  Laterality: Left;   HOLMIUM LASER APPLICATION N/A 02/13/2015    Procedure: HOLMIUM LASER APPLICATION;  Surgeon: Jerilee Field, MD;  Location: [facility];  Service: Urology;  Laterality: N/A;   IR NEPHROSTOMY PLACEMENT RIGHT  07/05/2021   KNEE SURGERY Bilateral 2019   realignment of knees   LAPAROSCOPIC GASTRIC SLEEVE RESECTION     LAPAROSCOPY     x 4   MELANOMA EXCISION  2000   melanoma removed from face, basal cell removed from nose   NEPHROLITHOTOMY Right 07/08/2021   Procedure: NEPHROLITHOTOMY PERCUTANEOUS- with stent placement;  Surgeon: Malen Gauze, MD;  Location: [facility];  Service: Urology;  Laterality: Right;   REPAIR VAGINAL CUFF N/A 08/21/2015   Procedure: REPAIR VAGINAL CUFF, EXAM UNDER ANESTHESIA;  Surgeon: Adolphus Birchwood, MD;  Location: [facility];  Service: Gynecology;  Laterality: N/A;   ROBOTIC ASSISTED TOTAL HYSTERECTOMY WITH BILATERAL SALPINGO OOPHERECTOMY Bilateral 05/21/2015   Procedure: XI ROBOTIC ASSISTED TOTAL HYSTERECTOMY WITH BILATERAL SALPINGO OOPHORECTOMY;  Surgeon: Adolphus Birchwood, MD;  Location: [facility];  Service: Gynecology;  Laterality: Bilateral;   STONE EXTRACTION WITH BASKET Bilateral 01/20/2022   Procedure: STONE EXTRACTION WITH BASKET;  Surgeon: Malen Gauze, MD;  Location: [facility];  Service: Urology;  Laterality: Bilateral;   STONE EXTRACTION WITH BASKET Left 11/03/2022   Procedure: STONE EXTRACTION WITH BASKET;  Surgeon: Malen Gauze, MD;  Location: [facility];  Service: Urology;  Laterality: Left;   Family History  Problem Relation Age of Onset   Hypertension Mother    Melanoma Mother    Breast cancer Mother 73   Cancer - Other Father        liver   Diabetes Mellitus II Maternal Grandmother    Heart disease Maternal Grandmother    Breast cancer Maternal Grandmother    Cancer - Lung Maternal Grandfather    Osteoporosis Paternal Grandmother    Melanoma Paternal Grandfather    Social History   Socioeconomic History   Marital status: Married    Spouse name: Not on file   Number of children: 1   Years of  education: Not on file   Highest education level: Associate degree: occupational, Scientist, product/process development, or vocational program  Occupational History   Not on file  Tobacco Use   Smoking status: Never   Smokeless tobacco:  Never  Vaping Use   Vaping status: Never Used  Substance and Sexual Activity   Alcohol use: No   Drug use: No   Sexual activity: Yes  Other Topics Concern   Not on file  Social History Narrative   Lives at home with family. Currently living with mother to help take care of her    Right handed   No Caffeine   Social Determinants of Health   Financial Resource Strain: Low Risk  (07/24/2023)   Received from [facility]   Overall Financial Resource Strain (CARDIA)    Difficulty of Paying Living Expenses: Not hard at all  Food Insecurity: No Food Insecurity (07/24/2023)   Received from [facility]   Hunger Vital Sign    Worried About Running Out of Food in the Last Year: Never true    Ran Out of Food in the Last Year: Never true  Transportation Needs: No Transportation Needs (07/24/2023)   Received from [facility] - Transportation    In the past 12 months, has lack of transportation kept you from medical appointments or from getting medications?: No    Lack of Transportation (Non-Medical): No  Physical Activity: Not on file  Stress: Not on file  Social Connections: Unknown (02/01/2022)   Received from [facility]   Social Network    Social Network: Not on file  Intimate Partner Violence: Unknown (01/05/2022)   Received from [facility] [place], [facility]   HITS    Physically Hurt: Not on file    Insult or Talk Down To: Not on file    Threaten Physical Harm: Not on file    Scream or Curse: Not on file    Review of Systems Constitutional: Patient denies any unintentional weight loss or change in strength lntegumentary: Patient denies any rashes or pruritus Cardiovascular: Patient denies  chest pain or syncope Respiratory: Patient denies shortness of breath Gastrointestinal: Patient denies nausea, vomiting, constipation, or diarrhea Musculoskeletal: Patient denies muscle cramps or weakness Neurologic: Patient denies convulsions or seizures Allergic/Immunologic: Patient denies recent allergic reaction(s) Hematologic/Lymphatic: Patient denies bleeding tendencies Endocrine: Patient denies heat/cold intolerance  GU: As per HPI.  OBJECTIVE Vitals:   09/13/23 1428  BP: 106/71  Pulse: (!) 105  Temp: 98.5 F (36.9 C)   There is no height or weight on file to calculate BMI.  Physical Examination Constitutional: No obvious distress; patient is non-toxic appearing  Cardiovascular: No visible lower extremity edema.  Respiratory: The patient does not have audible wheezing/stridor; respirations do not appear labored  Gastrointestinal: Abdomen non-distended Musculoskeletal: Normal ROM of UEs  Skin: No obvious rashes/open sores  Neurologic: CN 2-12 grossly intact Psychiatric: Answered questions appropriately with normal affect  Hematologic/Lymphatic/Immunologic: No obvious bruises or sites of spontaneous bleeding  UA: negative  PVR: 0 ml  ASSESSMENT OAB (overactive bladder) - Plan: Urinalysis, Routine w reflex microscopic, BLADDER SCAN AMB NON-IMAGING, Ambulatory Referral For Surgery Scheduling  Kidney stones - Plan: Urinalysis, Routine w reflex microscopic, BLADDER SCAN AMB NON-IMAGING  Urinary frequency - Plan: Ambulatory Referral For Surgery Scheduling  Urinary urgency - Plan: Ambulatory Referral For Surgery Scheduling  Urge incontinence - Plan: Ambulatory Referral For Surgery Scheduling  Right flank pain  RLQ abdominal pain  We discussed low suspicion for GU etiology for her right flank & RLQ pain based on today's normal UA and KUB. Advised to follow up with PCP if that persists.   For OAB:  - Persistent  despite behavioral modifications. - Not a safe  candidate for anticholinergics due to pre-existing dry mouth & dry eyes along with risk for exacerbating her multiple sclerosus. - Myrbetriq cost prohibitive. - Agreed to continue Gemtesa 75 mg daily via samples.  For refractory OAB we discussed 3rd line therapies in detail including: PTNS (posterior tibial nerve stimulation) Sacral neuromodulation trial (Medtronic lnterStim or Axonics implant) Bladder Botox injections  She decided to proceed with Axonics sacral neuromodulation.   Pt verbalized understanding and agreement. All questions were answered.  PLAN Advised the following: 1. Continue Gemtesa 75 mg daily (samples provided today). 2. Minimize caffeine intake. 3. Surgery request submitted for Axonics sacral neuromodulation Stage 1 & 2 with Dr. Ronne Binning.   Orders Placed This Encounter  Procedures   Urinalysis, Routine w reflex microscopic   Ambulatory Referral For Surgery Scheduling    Referral Priority:   Routine    Referral Type:   Consultation    Number of Visits Requested:   1   BLADDER SCAN AMB NON-IMAGING    It has been explained that the patient is to follow regularly with their PCP in addition to all other providers involved in their care and to follow instructions provided by these respective offices. Patient advised to contact urology clinic if any urologic-pertaining questions, concerns, new symptoms or problems arise in the interim period.  Patient Instructions  Overactive bladder (OAB) overview for patients:  Symptoms may include: urinary urgency ("gotta go" feeling) urinary frequency (voiding >8 times per day) night time urination (nocturia) urge incontinence of urine (UUI)  While we do not know the exact etiology of OAB, several treatment options exist including:  Behavioral therapy: Reducing fluid intake Decreasing bladder stimulants (such as caffeine) and irritants (such as acidic food, spicy foods, alcohol) Urge suppression strategies Bladder  retraining via timed voiding  Pelvic floor physical therapy  Medication(s) - can use one or both of the drug classes below. Anticholinergic / antimuscarinic medications:  Mechanism of action: Activate M3 receptors to reduce detrusor stimulation and increase bladder capacity   (parasympathetic nervous system). Effect: Relaxes the bladder to decrease overactivity, increase bladder storage capacity, and increase time between voids. Onset: Slow acting (may take 8-12 weeks to determine efficacy). Medications include: Vesicare (Solifenacin), Ditropan (Oxybutynin), Detrol (Tolterodine), Toviaz (Fesoterodine), Sanctura (Trospium), Urispas (Flavoxate), Enablex (Darifenacin), Bentyl (Dicyclomine), Levsin (Hyoscyamine ). Potential side effects include but are not limited to: Dry eyes, dry mouth, constipation, cognitive impairment, dementia risk with long term use, and urinary retention/ incomplete bladder emptying. Insurance companies generally prefer for patients to try 1-2 anticholinergic / antimuscarinic medications first due to low cost. Some exceptions are made based on patient-specific comorbidities / risk factors. Beta-3 agonist medications: Mechanism of action: Stimulates selective B3 adrenergic receptors to cause smooth muscle bladder relaxation (sympathetic nervous system). Effect: Relaxes the bladder to decrease overactivity, increase bladder storage capacity, and increase time between voids. Onset: Slow acting (may take 8-12 weeks to determine efficacy). Medications include: Myrbetriq (Mirabegron) and Vibegron [name]). Potential side effects include but are not limited to: urinary retention / incomplete bladder emptying and elevated blood pressure (more likely to occur in individuals with pre-existing uncontrolled hypertension). These medications tend to be more expensive than the anticholinergic / antimuscarinic medications.   For patients with refractory OAB (if the above treatment  options have been unsuccessful): Posterior tibial nerve stimulation (PTNS). Small acupuncture-type needle inserted near ankle with electric current to stimulate bladder via posterior tibial nerve pathway. Initially requires 12 weekly in-office treatments lasting 30  minutes each; followed by monthly in-office treatments lasting 30 minutes each for 1 year.  Bladder Botox injections. How it is done: Typically done via in-office cystoscopy; sometimes done in the OR depending on the situation. The bladder is numbed with lidocaine instilled via a catheter. Then the urologist injects Botox into the bladder muscle wall in about 20 locations. Causes local paralysis of the bladder muscle at the injection sites to reduce bladder muscle overactivity / spasms. The effect lasts for approximately 6 months and cannot be reversed once performed. Risks may included but are not limited to: infection, incomplete bladder emptying/ urinary retention, short term need for self-catheterization or indwelling catheter, and need for repeat therapy. There is a 5-12% chance of needing to catheterize with Botox - that usually resolves in a few months as the Botox wears off. Typically Botox injections would need to be repeated every 3-12 months since this is not a permanent therapy.  Sacral neuromodulation trial (Medtronic lnterStim or Axonics implant). Sacral neuromodulation is FDA-approved for uncontrolled urinary urgency, urinary frequency, urinary urge incontinence, non-obstructive urinary retention, or fecal incontinence. It is not FDA-approved as a treatment for pain. The goal of this therapy is at least a 50% improvement in symptoms. It is NOT realistic to expect a 100% cure. This is a a 2-step outpatient procedure. After a successful test period, a permanent wire and generator are placed in the OR. We discussed the risk of infection. We reviewed the fact that about 30% of patients fail the test phase and are not candidates for  permanent generator placement. During the 1-2 week trial phase, symptoms are documented by the patient to determine response. If patient gets at least a 50% improvement in symptoms, they may then proceed with Step 2. Step 1: Trial lead placement. Per physician discretion, may done one of two ways: Percutaneous nerve evaluation (PNE) in the [facility] urology office. Performed by urologist under local anesthesia (numbing the area with lidocaine) using a spinal needle for placement of test wire, which usually stays in place for 5-7 days to determine therapy response. Test lead placement in OR under anesthesia. Usually stays in place 2 weeks to determine therapy response. > Step 2: Permanent implantation of sacral neuromodulation device, which is performed in the OR.  Sacral neuromodulation implants: All are conditionally MRI safe. Manufacturer: Medtronic Website: [employer]gl therapy/right-for-you.html Options: lnterStim X: Non-rechargeable. The battery lasts 10 years on average. lnterStim Micro: Rechargeable. The battery lasts 15 years on average and must be charged routinely. Approximately 50% smaller implant than lnterStim X implant.  Manufacturer: Axonics Website: Findrealrelief.axonics.com Options: Non-rechargeable (Axonics F15): The battery lasts 15 years on average. Rechargeable (Axonics R20): The battery lasts 20 years on average and must be charged in office for about 1 hour every 6-10 months on average. Approximately 50% smaller implant than Axonics non-rechargeable implant.  Note: Generally the rechargeable devices are only advised for very small or thin patients who may not have sufficient adipose tissue to comfortably overlay the implanted device.  Suprapubic catheter (SP tube) placement. Only done in severely refractory OAB when all other options have failed or are not a viable treatment choice depending  on patient factors. Involves placement of a catheter through the lower abdomen into the bladder to continuously drain the bladder into an external collection bag, which patient can then empty at their convenience every few hours. Done via an outpatient surgical procedure in the OR under anesthesia. Risks may included but are not limited to: surgical site pain, infections, skin  irritation / breakdown, chronic bacteriuria, symptomatic UTls. The SP tube must stay in place continuously. This is a reversible procedure however - the insertion site will close if catheter is removed for more than a few hours. The SP tube must be exchanged routinely every 4 weeks to prevent the catheter from becoming clogged with sediment. SP tube exchanges are typically performed at a urology nurse visit or by a home health nurse.   Electronically signed by:  Donnita Falls, FNP   09/13/23    3:19 PM

## 2023-09-13 ENCOUNTER — Ambulatory Visit (INDEPENDENT_AMBULATORY_CARE_PROVIDER_SITE_OTHER): Payer: Medicare HMO | Admitting: Urology

## 2023-09-13 ENCOUNTER — Encounter: Payer: Self-pay | Admitting: Urology

## 2023-09-13 ENCOUNTER — Ambulatory Visit (HOSPITAL_COMMUNITY)
Admission: RE | Admit: 2023-09-13 | Discharge: 2023-09-13 | Disposition: A | Discharge status: Home / Self Care | Payer: Medicare HMO | Source: Ambulatory Visit | Attending: Urology | Admitting: Urology

## 2023-09-13 VITALS — BP 106/71 | HR 105 | Temp 98.5°F

## 2023-09-13 DIAGNOSIS — Z87442 Personal history of urinary calculi: Secondary | ICD-10-CM

## 2023-09-13 DIAGNOSIS — N3281 Overactive bladder: Secondary | ICD-10-CM | POA: Diagnosis not present

## 2023-09-13 DIAGNOSIS — R1031 Right lower quadrant pain: Secondary | ICD-10-CM | POA: Insufficient documentation

## 2023-09-13 DIAGNOSIS — N200 Calculus of kidney: Secondary | ICD-10-CM | POA: Diagnosis present

## 2023-09-13 DIAGNOSIS — R109 Unspecified abdominal pain: Secondary | ICD-10-CM | POA: Insufficient documentation

## 2023-09-13 DIAGNOSIS — N3941 Urge incontinence: Secondary | ICD-10-CM

## 2023-09-13 DIAGNOSIS — R350 Frequency of micturition: Secondary | ICD-10-CM | POA: Diagnosis not present

## 2023-09-13 DIAGNOSIS — R3915 Urgency of urination: Secondary | ICD-10-CM

## 2023-09-13 LAB — URINALYSIS, ROUTINE W REFLEX MICROSCOPIC
Bilirubin, UA: NEGATIVE
Glucose, UA: NEGATIVE
Ketones, UA: NEGATIVE
Leukocytes,UA: NEGATIVE
Nitrite, UA: NEGATIVE
Protein,UA: NEGATIVE
RBC, UA: NEGATIVE
Specific Gravity, UA: <1.005 — ABNORMAL LOW (ref 1.005–1.030)
Urobilinogen, Ur: 0.2 mg/dL (ref 0.2–1.0)
pH, UA: 6 (ref 5.0–7.5)

## 2023-09-13 LAB — BLADDER SCAN AMB NON-IMAGING: Scan Result: 0

## 2023-09-13 NOTE — Patient Instructions (Signed)
Overactive bladder (OAB) overview for patients:  Symptoms may include: urinary urgency ("gotta go" feeling) urinary frequency (voiding >8 times per day) night time urination (nocturia) urge incontinence of urine (UUI)  While we do not know the exact etiology of OAB, several treatment options exist including:  Behavioral therapy: Reducing fluid intake Decreasing bladder stimulants (such as caffeine) and irritants (such as acidic food, spicy foods, alcohol) Urge suppression strategies Bladder retraining via timed voiding  Pelvic floor physical therapy  Medication(s) - can use one or both of the drug classes below. Anticholinergic / antimuscarinic medications:  Mechanism of action: Activate M3 receptors to reduce detrusor stimulation and increase bladder capacity  (parasympathetic nervous system). Effect: Relaxes the bladder to decrease overactivity, increase bladder storage capacity, and increase time between voids. Onset: Slow acting (may take 8-12 weeks to determine efficacy). Medications include: Vesicare (Solifenacin), Ditropan (Oxybutynin), Detrol (Tolterodine), Toviaz (Fesoterodine), Sanctura (Trospium), Urispas (Flavoxate), Enablex (Darifenacin), Bentyl (Dicyclomine), Levsin (Hyoscyamine ). Potential side effects include but are not limited to: Dry eyes, dry mouth, constipation, cognitive impairment, dementia risk with long term use, and urinary retention/ incomplete bladder emptying. Insurance companies generally prefer for patients to try 1-2 anticholinergic / antimuscarinic medications first due to low cost. Some exceptions are made based on patient-specific comorbidities / risk factors. Beta-3 agonist medications: Mechanism of action: Stimulates selective B3 adrenergic receptors to cause smooth muscle bladder relaxation (sympathetic nervous system). Effect: Relaxes the bladder to decrease overactivity, increase bladder storage capacity, and increase time  between voids. Onset: Slow acting (may take 8-12 weeks to determine efficacy). Medications include: Myrbetriq (Mirabegron) and Vibegron [name]). Potential side effects include but are not limited to: urinary retention / incomplete bladder emptying and elevated blood pressure (more likely to occur in individuals with pre-existing uncontrolled hypertension). These medications tend to be more expensive than the anticholinergic / antimuscarinic medications.   For patients with refractory OAB (if the above treatment options have been unsuccessful): Posterior tibial nerve stimulation (PTNS). Small acupuncture-type needle inserted near ankle with electric current to stimulate bladder via posterior tibial nerve pathway. Initially requires 12 weekly in-office treatments lasting 30 minutes each; followed by monthly in-office treatments lasting 30 minutes each for 1 year.  Bladder Botox injections. How it is done: Typically done via in-office cystoscopy; sometimes done in the OR depending on the situation. The bladder is numbed with lidocaine instilled via a catheter. Then the urologist injects Botox into the bladder muscle wall in about 20 locations. Causes local paralysis of the bladder muscle at the injection sites to reduce bladder muscle overactivity / spasms. The effect lasts for approximately 6 months and cannot be reversed once performed. Risks may included but are not limited to: infection, incomplete bladder emptying/ urinary retention, short term need for self-catheterization or indwelling catheter, and need for repeat therapy. There is a 5-12% chance of needing to catheterize with Botox - that usually resolves in a few months as the Botox wears off. Typically Botox injections would need to be repeated every 3-12 months since this is not a permanent therapy.  Sacral neuromodulation trial (Medtronic lnterStim or Axonics implant). Sacral neuromodulation is FDA-approved for uncontrolled urinary  urgency, urinary frequency, urinary urge incontinence, non-obstructive urinary retention, or fecal incontinence. It is not FDA-approved as a treatment for pain. The goal of this therapy is at least a 50% improvement in symptoms. It is NOT realistic to expect a 100% cure. This is a a 2-step outpatient procedure. After a successful  test period, a permanent wire and generator are placed in the OR. We discussed the risk of infection. We reviewed the fact that about 30% of patients fail the test phase and are not candidates for permanent generator placement. During the 1-2 week trial phase, symptoms are documented by the patient to determine response. If patient gets at least a 50% improvement in symptoms, they may then proceed with Step 2. Step 1: Trial lead placement. Per physician discretion, may done one of two ways: Percutaneous nerve evaluation (PNE) in the [facility] urology office. Performed by urologist under local anesthesia (numbing the area with lidocaine) using a spinal needle for placement of test wire, which usually stays in place for 5-7 days to determine therapy response. Test lead placement in OR under anesthesia. Usually stays in place 2 weeks to determine therapy response. > Step 2: Permanent implantation of sacral neuromodulation device, which is performed in the OR.  Sacral neuromodulation implants: All are conditionally MRI safe. Manufacturer: Medtronic Website: [employer]gl therapy/right-for-you.html Options: lnterStim X: Non-rechargeable. The battery lasts 10 years on average. lnterStim Micro: Rechargeable. The battery lasts 15 years on average and must be charged routinely. Approximately 50% smaller implant than lnterStim X implant.  Manufacturer: Axonics Website: Findrealrelief.axonics.com Options: Non-rechargeable (Axonics F15): The battery lasts 15 years on average. Rechargeable (Axonics R20):  The battery lasts 20 years on average and must be charged in office for about 1 hour every 6-10 months on average. Approximately 50% smaller implant than Axonics non-rechargeable implant.  Note: Generally the rechargeable devices are only advised for very small or thin patients who may not have sufficient adipose tissue to comfortably overlay the implanted device.  Suprapubic catheter (SP tube) placement. Only done in severely refractory OAB when all other options have failed or are not a viable treatment choice depending on patient factors. Involves placement of a catheter through the lower abdomen into the bladder to continuously drain the bladder into an external collection bag, which patient can then empty at their convenience every few hours. Done via an outpatient surgical procedure in the OR under anesthesia. Risks may included but are not limited to: surgical site pain, infections, skin irritation / breakdown, chronic bacteriuria, symptomatic UTls. The SP tube must stay in place continuously. This is a reversible procedure however - the insertion site will close if catheter is removed for more than a few hours. The SP tube must be exchanged routinely every 4 weeks to prevent the catheter from becoming clogged with sediment. SP tube exchanges are typically performed at a urology nurse visit or by a home health nurse.

## 2023-09-24 ENCOUNTER — Other Ambulatory Visit: Payer: Self-pay | Admitting: Urology

## 2023-09-24 DIAGNOSIS — N200 Calculus of kidney: Secondary | ICD-10-CM

## 2023-10-02 ENCOUNTER — Ambulatory Visit: Payer: Medicare HMO | Admitting: Podiatry

## 2023-10-02 ENCOUNTER — Telehealth: Payer: Self-pay

## 2023-10-02 NOTE — Telephone Encounter (Signed)
-----   Message from Donnita Falls sent at 09/30/2023  6:22 PM EST ----- Please let pt know KUB was normal - no stones visualized. Thanks.

## 2023-10-02 NOTE — Telephone Encounter (Signed)
Patient was made aware via voiced mail

## 2023-10-09 ENCOUNTER — Other Ambulatory Visit: Payer: Self-pay | Admitting: Urology

## 2023-10-09 DIAGNOSIS — R350 Frequency of micturition: Secondary | ICD-10-CM

## 2023-10-09 DIAGNOSIS — R3915 Urgency of urination: Secondary | ICD-10-CM

## 2023-10-09 DIAGNOSIS — N3941 Urge incontinence: Secondary | ICD-10-CM

## 2023-10-09 DIAGNOSIS — N3281 Overactive bladder: Secondary | ICD-10-CM

## 2023-10-09 NOTE — Telephone Encounter (Signed)
 Discussed with patient, see work que for communication.

## 2023-10-17 ENCOUNTER — Ambulatory Visit: Payer: Medicare HMO | Admitting: Podiatry

## 2023-10-24 ENCOUNTER — Ambulatory Visit: Payer: Medicare HMO | Admitting: Podiatry

## 2023-10-25 ENCOUNTER — Encounter (HOSPITAL_COMMUNITY): Payer: Self-pay

## 2023-10-25 ENCOUNTER — Other Ambulatory Visit: Payer: Self-pay

## 2023-10-25 ENCOUNTER — Encounter (HOSPITAL_COMMUNITY)
Admission: RE | Admit: 2023-10-25 | Discharge: 2023-10-25 | Disposition: A | Discharge status: Home / Self Care | Payer: Medicare HMO | Source: Ambulatory Visit | Attending: Urology | Admitting: Urology

## 2023-10-25 MED ORDER — GENTAMICIN SULFATE 40 MG/ML IJ SOLN
5.0000 mg/kg | INTRAVENOUS | Status: DC
  Filled 2023-10-25: qty 10.5

## 2023-10-26 ENCOUNTER — Ambulatory Visit (HOSPITAL_BASED_OUTPATIENT_CLINIC_OR_DEPARTMENT_OTHER): Payer: Medicare HMO | Admitting: Certified Registered"

## 2023-10-26 ENCOUNTER — Encounter (HOSPITAL_COMMUNITY)
Admission: RE | Disposition: A | Discharge status: Home / Self Care | Payer: Self-pay | Source: Home / Self Care | Attending: Urology

## 2023-10-26 ENCOUNTER — Encounter (HOSPITAL_COMMUNITY): Payer: Self-pay | Admitting: Urology

## 2023-10-26 ENCOUNTER — Ambulatory Visit (HOSPITAL_COMMUNITY): Payer: Medicare HMO

## 2023-10-26 ENCOUNTER — Ambulatory Visit (HOSPITAL_COMMUNITY): Payer: Medicare HMO | Admitting: Certified Registered"

## 2023-10-26 ENCOUNTER — Ambulatory Visit (HOSPITAL_COMMUNITY)
Admission: RE | Admit: 2023-10-26 | Discharge: 2023-10-26 | Disposition: A | Discharge status: Home / Self Care | Payer: Medicare HMO | Attending: Urology | Admitting: Urology

## 2023-10-26 ENCOUNTER — Other Ambulatory Visit: Payer: Self-pay

## 2023-10-26 DIAGNOSIS — G35 Multiple sclerosis: Secondary | ICD-10-CM | POA: Diagnosis not present

## 2023-10-26 DIAGNOSIS — F418 Other specified anxiety disorders: Secondary | ICD-10-CM | POA: Insufficient documentation

## 2023-10-26 DIAGNOSIS — N289 Disorder of kidney and ureter, unspecified: Secondary | ICD-10-CM | POA: Insufficient documentation

## 2023-10-26 DIAGNOSIS — I10 Essential (primary) hypertension: Secondary | ICD-10-CM | POA: Insufficient documentation

## 2023-10-26 DIAGNOSIS — G709 Myoneural disorder, unspecified: Secondary | ICD-10-CM | POA: Diagnosis not present

## 2023-10-26 DIAGNOSIS — K219 Gastro-esophageal reflux disease without esophagitis: Secondary | ICD-10-CM | POA: Diagnosis not present

## 2023-10-26 DIAGNOSIS — N3281 Overactive bladder: Secondary | ICD-10-CM | POA: Diagnosis present

## 2023-10-26 DIAGNOSIS — N200 Calculus of kidney: Secondary | ICD-10-CM | POA: Diagnosis not present

## 2023-10-26 DIAGNOSIS — N3941 Urge incontinence: Secondary | ICD-10-CM | POA: Insufficient documentation

## 2023-10-26 HISTORY — PX: INTERSTIM IMPLANT PLACEMENT: SHX5130

## 2023-10-26 SURGERY — INSERTION, SACRAL NERVE STIMULATOR, INTERSTIM, STAGE 1
Anesthesia: General | Site: Back

## 2023-10-26 MED ORDER — FENTANYL CITRATE (PF) 100 MCG/2ML IJ SOLN
INTRAMUSCULAR | Status: DC | PRN
  Administered 2023-10-26 (×4): 25 ug via INTRAVENOUS

## 2023-10-26 MED ORDER — LIDOCAINE HCL (PF) 2 % IJ SOLN
INTRAMUSCULAR | Status: DC | PRN
  Administered 2023-10-26: 40 mg via INTRADERMAL

## 2023-10-26 MED ORDER — FENTANYL CITRATE PF 50 MCG/ML IJ SOSY
25.0000 ug | PREFILLED_SYRINGE | INTRAMUSCULAR | Status: DC | PRN
  Administered 2023-10-26 (×2): 50 ug via INTRAVENOUS
  Filled 2023-10-26 (×2): qty 1

## 2023-10-26 MED ORDER — TRAMADOL HCL 50 MG PO TABS: 50.0000 mg | ORAL_TABLET | Freq: Four times a day (QID) | ORAL | 0 refills | Status: DC | PRN

## 2023-10-26 MED ORDER — LIDOCAINE-EPINEPHRINE (PF) 1 %-1:200000 IJ SOLN
INTRAMUSCULAR | Status: AC
  Filled 2023-10-26: qty 30

## 2023-10-26 MED ORDER — OXYCODONE HCL 5 MG PO TABS
5.0000 mg | ORAL_TABLET | Freq: Once | ORAL | Status: AC | PRN
  Administered 2023-10-26: 5 mg via ORAL
  Filled 2023-10-26: qty 1

## 2023-10-26 MED ORDER — CHLORHEXIDINE GLUCONATE 0.12 % MT SOLN
15.0000 mL | Freq: Once | OROMUCOSAL | Status: AC
  Administered 2023-10-26: 15 mL via OROMUCOSAL
  Filled 2023-10-26: qty 15

## 2023-10-26 MED ORDER — ONDANSETRON HCL 4 MG/2ML IJ SOLN
INTRAMUSCULAR | Status: DC | PRN
  Administered 2023-10-26: 4 mg via INTRAVENOUS

## 2023-10-26 MED ORDER — OXYCODONE HCL 5 MG/5ML PO SOLN: 5.0000 mg | Freq: Once | ORAL | Status: AC | PRN

## 2023-10-26 MED ORDER — PROPOFOL 500 MG/50ML IV EMUL
INTRAVENOUS | Status: AC
  Filled 2023-10-26: qty 50

## 2023-10-26 MED ORDER — PROPOFOL 10 MG/ML IV BOLUS
INTRAVENOUS | Status: DC | PRN
  Administered 2023-10-26: 20 mg via INTRAVENOUS
  Administered 2023-10-26: 40 mg via INTRAVENOUS
  Administered 2023-10-26: 10 mg via INTRAVENOUS
  Administered 2023-10-26: 40 mg via INTRAVENOUS

## 2023-10-26 MED ORDER — LIDOCAINE-EPINEPHRINE (PF) 1 %-1:200000 IJ SOLN
INTRAMUSCULAR | Status: DC | PRN
  Administered 2023-10-26: 30 mL

## 2023-10-26 MED ORDER — ONDANSETRON HCL 4 MG/2ML IJ SOLN
INTRAMUSCULAR | Status: AC
Start: 2023-10-26 — End: ?
  Filled 2023-10-26: qty 2

## 2023-10-26 MED ORDER — PROPOFOL 10 MG/ML IV BOLUS
INTRAVENOUS | Status: AC
Start: 2023-10-26 — End: ?
  Filled 2023-10-26: qty 20

## 2023-10-26 MED ORDER — GLYCOPYRROLATE PF 0.2 MG/ML IJ SOSY
PREFILLED_SYRINGE | INTRAMUSCULAR | Status: AC
  Filled 2023-10-26: qty 1

## 2023-10-26 MED ORDER — PROPOFOL 500 MG/50ML IV EMUL
INTRAVENOUS | Status: DC | PRN
  Administered 2023-10-26: 50 ug/kg/min via INTRAVENOUS

## 2023-10-26 MED ORDER — ONDANSETRON HCL 4 MG/2ML IJ SOLN
4.0000 mg | Freq: Once | INTRAMUSCULAR | Status: AC | PRN
Start: 2023-10-26 — End: 2023-10-26
  Administered 2023-10-26: 4 mg via INTRAVENOUS
  Filled 2023-10-26: qty 2

## 2023-10-26 MED ORDER — WATER FOR IRRIGATION, STERILE IR SOLN
Status: DC | PRN
  Administered 2023-10-26: 500 mL via SURGICAL_CAVITY

## 2023-10-26 MED ORDER — DEXMEDETOMIDINE HCL IN NACL 80 MCG/20ML IV SOLN
INTRAVENOUS | Status: DC | PRN
  Administered 2023-10-26: 12 ug via INTRAVENOUS

## 2023-10-26 MED ORDER — SULFAMETHOXAZOLE-TRIMETHOPRIM 800-160 MG PO TABS
1.0000 | ORAL_TABLET | Freq: Two times a day (BID) | ORAL | 0 refills | Status: DC
Start: 2023-10-26 — End: 2023-10-30

## 2023-10-26 MED ORDER — LIDOCAINE HCL (PF) 2 % IJ SOLN
INTRAMUSCULAR | Status: AC
  Filled 2023-10-26: qty 5

## 2023-10-26 MED ORDER — GLYCOPYRROLATE PF 0.2 MG/ML IJ SOSY
PREFILLED_SYRINGE | INTRAMUSCULAR | Status: DC | PRN
  Administered 2023-10-26 (×2): .1 mg via INTRAVENOUS

## 2023-10-26 MED ORDER — LACTATED RINGERS IV SOLN: INTRAVENOUS | Status: DC

## 2023-10-26 MED ORDER — MIDAZOLAM HCL 2 MG/2ML IJ SOLN
INTRAMUSCULAR | Status: DC | PRN
  Administered 2023-10-26: 2 mg via INTRAVENOUS

## 2023-10-26 MED ORDER — ORAL CARE MOUTH RINSE: 15.0000 mL | Freq: Once | OROMUCOSAL | Status: AC

## 2023-10-26 MED ORDER — FENTANYL CITRATE (PF) 100 MCG/2ML IJ SOLN
INTRAMUSCULAR | Status: AC
  Filled 2023-10-26: qty 2

## 2023-10-26 MED ORDER — MIDAZOLAM HCL 2 MG/2ML IJ SOLN
INTRAMUSCULAR | Status: AC
  Filled 2023-10-26: qty 2

## 2023-10-26 MED ORDER — GENTAMICIN SULFATE 40 MG/ML IJ SOLN
INTRAVENOUS | Status: DC | PRN
  Administered 2023-10-26: 420 mg via INTRAVENOUS

## 2023-10-26 SURGICAL SUPPLY — 34 items
CHLORAPREP W/TINT 26 (MISCELLANEOUS) ×1 IMPLANT
COVER LIGHT HANDLE STERIS (MISCELLANEOUS) ×2 IMPLANT
DERMABOND ADVANCED .7 DNX12 (GAUZE/BANDAGES/DRESSINGS) IMPLANT
DRAPE C-ARMOR (DRAPES) ×1 IMPLANT
DRAPE INCISE IOBAN 66X45 STRL (DRAPES) ×1 IMPLANT
DRAPE LAPAROSCOPIC ABDOMINAL (DRAPES) ×1 IMPLANT
DRSG TEGADERM 4X4.75 (GAUZE/BANDAGES/DRESSINGS) ×1 IMPLANT
DRSG TEGADERM 8X12 (GAUZE/BANDAGES/DRESSINGS) IMPLANT
ELECT REM PT RETURN 9FT ADLT (ELECTROSURGICAL) ×1
ELECTRODE REM PT RTRN 9FT ADLT (ELECTROSURGICAL) ×1 IMPLANT
EXTENSION NEURO PERCUTANEOUS (NEUROSURGERY SUPPLIES) IMPLANT
GAUZE SPONGE 4X4 12PLY STRL (GAUZE/BANDAGES/DRESSINGS) IMPLANT
GLOVE BIO SURGEON STRL SZ8 (GLOVE) ×1 IMPLANT
GLOVE BIOGEL PI IND STRL 7.0 (GLOVE) ×2 IMPLANT
GOWN STRL REUS W/TWL LRG LVL3 (GOWN DISPOSABLE) ×1 IMPLANT
GOWN STRL REUS W/TWL XL LVL3 (GOWN DISPOSABLE) ×1 IMPLANT
KIT LEAD TINED W/2 NDL GUIDE (NEUROSURGERY SUPPLIES) IMPLANT
KIT LEAD TINED W/STYLET (Lead) IMPLANT
KIT TURNOVER CYSTO (KITS) ×1 IMPLANT
LEAD INTERSTIM 4.32 28 L (Lead) ×1 IMPLANT
NDL HYPO 21X1.5 SAFETY (NEEDLE) ×1 IMPLANT
NEEDLE HYPO 21X1.5 SAFETY (NEEDLE) ×1
PACK MINOR (CUSTOM PROCEDURE TRAY) ×1 IMPLANT
PAD ARMBOARD 7.5X6 YLW CONV (MISCELLANEOUS) ×1 IMPLANT
PAD TELFA 3X4 1S STER (GAUZE/BANDAGES/DRESSINGS) IMPLANT
PERCUTANEOUS EXTENSION L#147353 (Stimulator) IMPLANT
REMOTE CONTROL NEURO PATIENT (NEUROSURGERY SUPPLIES) IMPLANT
SET BASIN LINEN APH (SET/KITS/TRAYS/PACK) ×1 IMPLANT
STIMULATOR EXTERNAL TRIAL (NEUROSURGERY SUPPLIES) IMPLANT
STIMULATOR INTERSTIM 2X1.7X.3 (Miscellaneous) IMPLANT
SUT MNCRL AB 4-0 PS2 18 (SUTURE) ×1 IMPLANT
SUT VIC AB 3-0 SH 27X BRD (SUTURE) ×1 IMPLANT
SYR CONTROL 10ML LL (SYRINGE) ×1 IMPLANT
WATER STERILE IRR 500ML POUR (IV SOLUTION) ×1 IMPLANT

## 2023-10-26 NOTE — H&P (Signed)
History of Present Illness: Denise Avila is a 48 y.o. female who presents today for follow up visit at [facility].  - GU history: 1. OAB with urinary frequency and urgency. - Not a safe candidate for anticholinergics due to pre-existing dry mouth & dry eyes along with risk for exacerbating her multiple sclerosus.  - Previously took Myrbetriq 50 mg daily (samples) but that was cost prohibitive. 2. Kidney stones.  - Has had prior ESWL, URS, and PCNL procedures.  - 11/03/2022: Stone analysis showed 100% calcium oxalate composition. - 02/28/2023: Litholink 24 hour urinalysis showed hypocitraturia, hyperoxaluria, hyperuricosuria. - 03/13/2023: RUS showed a 3.4 mm nonobstructive stone in the left kidney. - Taking sodium bicarb and Allopurinol for stone prevention. Could not tolerate UrocitK due to hyperkalemia.  - Taking Flomax daily. 3. Prior recurrent UTIs. No positive urine culture results in past 12 months found per chart review.   At last visit on 07/10/2023: The plan was: 1. Gemtesa 75 mg daily. 2. KUB  3. Continue minimal caffeine intake. 4. Continue sodium bicarb and Allopurinol for stone prevention.  5. Continue Flomax daily. 6. Return in about 6 weeks (around 08/21/2023) for UA, PVR, & f/u with Evette Georges NP   Today: KUB today: Awaiting radiology read; no stones appreciated per provider interpretation.   She reports no significant difference with Gemtesa compared to Myrbetriq. The [name] is cost prohibitive, as was Myrbetriq. She reports persistent / unchanged urinary frequency, nocturia, urgency, and urge incontinence. Continues wearing pads and having leaking multiple times per day.   She reports seeing some blood when wiping after urinating 2 weeks ago which lasted for a couple days; denies gross hematuria.    She denies straining to void or sensations of incomplete emptying.   She has had a two vaginal yeast infections over the past few months. Took Diflucan  but it recurred, at which point she used Monistat with improvement.    She denies recent episode of stone pain / passage. She reports intermittent right flank pain and RLQ abdominal pain over the past few weeks which is mild-to-moderate in severity.     Fall Screening: Do you usually have a device to assist in your mobility? No    Medications:       Current Outpatient Medications  Medication Sig Dispense Refill   acetaminophen (TYLENOL) 500 MG tablet Take 1,000 mg by mouth every 6 (six) hours as needed for moderate pain.       albuterol (VENTOLIN HFA) 108 (90 Base) MCG/ACT inhaler Inhale 1-2 puffs into the lungs every 6 (six) hours as needed for shortness of breath or wheezing.       allopurinol (ZYLOPRIM) 300 MG tablet Take 1 tablet (300 mg total) by mouth at bedtime. 90 tablet 3   baclofen (LIORESAL) 10 MG tablet Take 10 mg by mouth every 8 (eight) hours.       celecoxib (CELEBREX) 200 MG capsule Take 200 mg by mouth in the morning.       clonazePAM (KLONOPIN) 1 MG tablet Take 1 mg by mouth at bedtime. Takes 1 1/2 QHS   0   diphenhydrAMINE (BENADRYL) 25 MG tablet Take 25 mg by mouth every 6 (six) hours as needed (take with for migraines).       ezetimibe (ZETIA) 10 MG tablet Take 10 mg by mouth in the morning.       fluconazole (DIFLUCAN) 100 MG tablet Take 1 tablet (100 mg total) by mouth daily. 7 tablet 0  gabapentin (NEURONTIN) 800 MG tablet Take 2 tablets (1,600 mg total) by mouth 2 (two) times daily. 30 tablet 8   lamoTRIgine (LAMICTAL) 200 MG tablet Take 200 mg by mouth at bedtime. Takes 150 X 2       Multiple Vitamin (MULTIVITAMIN WITH MINERALS) TABS tablet Take 1 tablet by mouth in the morning. One A Day       omeprazole (PRILOSEC) 40 MG capsule Take 40 mg by mouth 2 (two) times daily.       ondansetron (ZOFRAN) 4 MG tablet Take 1 tablet (4 mg total) by mouth daily as needed for nausea or vomiting. 30 tablet 1   Polyethyl Glycol-Propyl Glycol (SYSTANE OP) Place 1 drop into both  eyes in the morning, at noon, and at bedtime.       prochlorperazine (COMPAZINE) 10 MG tablet Take 10 mg by mouth every 6 (six) hours as needed (migraines (nausea)).       promethazine (PHENERGAN) 25 MG tablet Take 1 tablet (25 mg total) by mouth 4 (four) times daily as needed for nausea or vomiting. 30 tablet 0   rosuvastatin (CRESTOR) 5 MG tablet Take 5 mg by mouth at bedtime.       Simethicone (GAS RELIEF) 250 MG CAPS Take 250 mg by mouth 3 (three) times daily as needed (gas/bloating).       sodium bicarbonate 650 MG tablet Take 1 tablet (650 mg total) by mouth 2 (two) times daily. 60 tablet 11   tamsulosin (FLOMAX) 0.4 MG CAPS capsule TAKE 1 CAPSULE EVERY DAY AFTER SUPPER 90 capsule 3   tiZANidine (ZANAFLEX) 2 MG tablet Take 2 mg by mouth every 8 (eight) hours.       Vibegron (GEMTESA) 75 MG TABS Take 1 tablet (75 mg total) by mouth daily. 30 tablet 11   methylphenidate (RITALIN) 5 MG tablet Take 5 mg by mouth at bedtime.          No current facility-administered medications for this visit.        Allergies: Allergies       Allergies  Allergen Reactions   Penicillins Swelling and Rash      Rash all over [place] and lip swelling. Has patient had a PCN reaction causing immediate rash, facial/tongue/throat swelling, SOB or lightheadedness with hypotension: Yes Has patient had a PCN reaction causing severe rash involving mucus membranes or skin necrosis: Yes Has patient had a PCN reaction that required hospitalization: No If all of the above answers are "NO", then may proceed with Cephalosporin use.   Nortriptyline Other (See Comments)      Severe chest pain      Nsaids Other (See Comments)      Advised not to take due to gastric sleeve    Amoxicillin Rash      Rash all over [place]     Ciprofloxacin Diarrhea and Nausea Only            Past Medical History:  Diagnosis Date   Abnormal Pap smear of cervix 12/2014   Acid reflux     Adrenal insufficiency (HCC)     Anxiety      Arthritis     Arthritis     Depression     Depression     Endometriosis     Fibroids      age 61   Fibromyalgia     High cholesterol     History of kidney stones     Liver disease  per pt report, will have biopsy Nov 08, 2017   Migraines     Pleurisy 02/2015    while hospitalized    Seizure (HCC)      on meds and no seizures in 2-3 years. Unknown etiology   Skin cancer (melanoma) (HCC)     Syncope and collapse     Uterine cancer (HCC)               Past Surgical History:  Procedure Laterality Date   ABDOMINAL HYSTERECTOMY       ABLATION        uterine   COLECTOMY       CYSTOSCOPY W/ URETERAL STENT PLACEMENT Left 02/02/2015    Procedure: CYSTOSCOPY WITH RETROGRADE PYELOGRAM/URETERAL STENT PLACEMENT;  Surgeon: Jerilee Field, MD;  Location: [facility];  Service: Urology;  Laterality: Left;   CYSTOSCOPY WITH RETROGRADE PYELOGRAM, URETEROSCOPY AND STENT PLACEMENT Bilateral 01/20/2022    Procedure: CYSTOSCOPY WITH RETROGRADE PYELOGRAM, URETEROSCOPY AND STENT PLACEMENT;  Surgeon: Malen Gauze, MD;  Location: [facility];  Service: Urology;  Laterality: Bilateral;   CYSTOSCOPY WITH RETROGRADE PYELOGRAM, URETEROSCOPY AND STENT PLACEMENT Left 11/03/2022    Procedure: CYSTOSCOPY WITH RETROGRADE PYELOGRAM, URETEROSCOPY AND STENT PLACEMENT;  Surgeon: Malen Gauze, MD;  Location: [facility];  Service: Urology;  Laterality: Left;  pt knows to arrive at 9:00   CYSTOSCOPY WITH URETEROSCOPY AND STENT PLACEMENT Left 02/13/2015    Procedure: LEFT URETEROSCOPY WITH HOLMIUM LASER AND STENT PLACEMENT;  Surgeon: Jerilee Field, MD;  Location: [facility];  Service: Urology;  Laterality: Left;   DILATION AND CURETTAGE OF UTERUS       ESOPHAGOGASTRODUODENOSCOPY ENDOSCOPY   10/11/2017    @ [facility]; BIOPSY OF LOWER INTESTINE   EXTRACORPOREAL SHOCK WAVE LITHOTRIPSY Left 09/13/2022    Procedure: EXTRACORPOREAL SHOCK WAVE LITHOTRIPSY (ESWL);  Surgeon: Malen Gauze, MD;  Location: [facility];  Service:  Urology;  Laterality: Left;   HOLMIUM LASER APPLICATION N/A 02/13/2015    Procedure: HOLMIUM LASER APPLICATION;  Surgeon: Jerilee Field, MD;  Location: [facility];  Service: Urology;  Laterality: N/A;   IR NEPHROSTOMY PLACEMENT RIGHT   07/05/2021   KNEE SURGERY Bilateral 2019    realignment of knees   LAPAROSCOPIC GASTRIC SLEEVE RESECTION       LAPAROSCOPY        x 4   MELANOMA EXCISION   2000    melanoma removed from face, basal cell removed from nose   NEPHROLITHOTOMY Right 07/08/2021    Procedure: NEPHROLITHOTOMY PERCUTANEOUS- with stent placement;  Surgeon: Malen Gauze, MD;  Location: [facility];  Service: Urology;  Laterality: Right;   REPAIR VAGINAL CUFF N/A 08/21/2015    Procedure: REPAIR VAGINAL CUFF, EXAM UNDER ANESTHESIA;  Surgeon: Adolphus Birchwood, MD;  Location: [facility];  Service: Gynecology;  Laterality: N/A;   ROBOTIC ASSISTED TOTAL HYSTERECTOMY WITH BILATERAL SALPINGO OOPHERECTOMY Bilateral 05/21/2015    Procedure: XI ROBOTIC ASSISTED TOTAL HYSTERECTOMY WITH BILATERAL SALPINGO OOPHORECTOMY;  Surgeon: Adolphus Birchwood, MD;  Location: [facility];  Service: Gynecology;  Laterality: Bilateral;   STONE EXTRACTION WITH BASKET Bilateral 01/20/2022    Procedure: STONE EXTRACTION WITH BASKET;  Surgeon: Malen Gauze, MD;  Location: [facility];  Service: Urology;  Laterality: Bilateral;   STONE EXTRACTION WITH BASKET Left 11/03/2022    Procedure: STONE EXTRACTION WITH BASKET;  Surgeon: Malen Gauze, MD;  Location: [facility];  Service: Urology;  Laterality: Left;             Family History  Problem Relation Age of Onset   Hypertension Mother     Melanoma Mother     Breast cancer Mother 19   Cancer - Other Father          liver   Diabetes Mellitus II Maternal Grandmother     Heart disease Maternal Grandmother     Breast cancer Maternal Grandmother     Cancer - Lung Maternal Grandfather     Osteoporosis Paternal Grandmother     Melanoma Paternal Grandfather          Social History          Socioeconomic History   Marital status: Married      Spouse name: Not on file   Number of children: 1   Years of education: Not on file   Highest education level: Associate degree: occupational, Scientist, product/process development, or vocational program  Occupational History   Not on file  Tobacco Use   Smoking status: Never   Smokeless tobacco: Never  Vaping Use   Vaping status: Never Used  Substance and Sexual Activity   Alcohol use: No   Drug use: No   Sexual activity: Yes  Other Topics Concern   Not on file  Social History Narrative    Lives at home with family. Currently living with mother to help take care of her     Right handed    No Caffeine    Social Determinants of Health        Financial Resource Strain: Low Risk  (07/24/2023)    Received from [facility]    Overall Financial Resource Strain (CARDIA)     Difficulty of Paying Living Expenses: Not hard at all  Food Insecurity: No Food Insecurity (07/24/2023)    Received from [facility]    Hunger Vital Sign     Worried About Running Out of Food in the Last Year: Never true     Ran Out of Food in the Last Year: Never true  Transportation Needs: No Transportation Needs (07/24/2023)    Received from [facility] - Transportation     In the past 12 months, has lack of transportation kept you from medical appointments or from getting medications?: No     Lack of Transportation (Non-Medical): No  Physical Activity: Not on file  Stress: Not on file  Social Connections: Unknown (02/01/2022)    Received from [facility]    Social Network     Social Network: Not on file  Intimate Partner Violence: Unknown (01/05/2022)    Received from [facility]    HITS     Physically Hurt: Not on file     Insult or Talk Down To: Not on file     Threaten Physical Harm: Not on file     Scream or Curse: Not on file      Review of Systems Constitutional:  Patient denies any unintentional weight loss or change in strength lntegumentary: Patient denies any rashes or pruritus Cardiovascular: Patient denies chest pain or syncope Respiratory: Patient denies shortness of breath Gastrointestinal: Patient denies nausea, vomiting, constipation, or diarrhea Musculoskeletal: Patient denies muscle cramps or weakness Neurologic: Patient denies convulsions or seizures Allergic/Immunologic: Patient denies recent allergic reaction(s) Hematologic/Lymphatic: Patient denies bleeding tendencies Endocrine: Patient denies heat/cold intolerance   GU: As per HPI.   OBJECTIVE    Vitals:    09/13/23 1428  BP: 106/71  Pulse: (!) 105  Temp:  98.5 F (36.9 C)    There is no height or weight on file to calculate BMI.   Physical Examination Constitutional: No obvious distress; patient is non-toxic appearing  Cardiovascular: No visible lower extremity edema.  Respiratory: The patient does not have audible wheezing/stridor; respirations do not appear labored  Gastrointestinal: Abdomen non-distended Musculoskeletal: Normal ROM of UEs  Skin: No obvious rashes/open sores  Neurologic: CN 2-12 grossly intact Psychiatric: Answered questions appropriately with normal affect  Hematologic/Lymphatic/Immunologic: No obvious bruises or sites of spontaneous bleeding   UA: negative  PVR: 0 ml   ASSESSMENT OAB (overactive bladder) - Plan: Urinalysis, Routine w reflex microscopic, BLADDER SCAN AMB NON-IMAGING, Ambulatory Referral For Surgery Scheduling   Kidney stones - Plan: Urinalysis, Routine w reflex microscopic, BLADDER SCAN AMB NON-IMAGING   Urinary frequency - Plan: Ambulatory Referral For Surgery Scheduling   Urinary urgency - Plan: Ambulatory Referral For Surgery Scheduling   Urge incontinence - Plan: Ambulatory Referral For Surgery Scheduling   Right flank pain   RLQ abdominal pain   We discussed low suspicion for GU etiology for her right flank & RLQ  pain based on today's normal UA and KUB. Advised to follow up with PCP if that persists.    For OAB:  - Persistent despite behavioral modifications. - Not a safe candidate for anticholinergics due to pre-existing dry mouth & dry eyes along with risk for exacerbating her multiple sclerosus. - Myrbetriq cost prohibitive. - Agreed to continue Gemtesa 75 mg daily via samples.   For refractory OAB we discussed 3rd line therapies in detail including: PTNS (posterior tibial nerve stimulation) Sacral neuromodulation trial (Medtronic lnterStim or Axonics implant) Bladder Botox injections   She decided to proceed with Axonics sacral neuromodulation.    Pt verbalized understanding and agreement. All questions were answered.   PLAN The risks/benefits/alternatives to Stage 1 interstim placement was explained to the patient and she understands and wishes to proceed with surgery

## 2023-10-26 NOTE — Anesthesia Preprocedure Evaluation (Signed)
Anesthesia Evaluation  Patient identified by MRN, date of birth, ID band Patient awake    Reviewed: Allergy & Precautions, H&P , NPO status , Patient's Chart, lab work & pertinent test results, reviewed documented beta blocker date and time   Airway Mallampati: II  TM Distance: >3 FB Neck ROM: full    Dental no notable dental hx.    Pulmonary neg pulmonary ROS   Pulmonary exam normal breath sounds clear to auscultation       Cardiovascular Exercise Tolerance: Good hypertension, negative cardio ROS  Rhythm:regular Rate:Normal     Neuro/Psych  Headaches, Seizures -,  PSYCHIATRIC DISORDERS Anxiety Depression     Neuromuscular disease negative neurological ROS  negative psych ROS   GI/Hepatic negative GI ROS, Neg liver ROS,GERD  ,,  Endo/Other  negative endocrine ROS    Renal/GU Renal diseasenegative Renal ROS  negative genitourinary   Musculoskeletal   Abdominal   Peds  Hematology negative hematology ROS (+)   Anesthesia Other Findings   Reproductive/Obstetrics negative OB ROS                             Anesthesia Physical Anesthesia Plan  ASA: 2  Anesthesia Plan: General   Post-op Pain Management:    Induction:   PONV Risk Score and Plan: Ondansetron  Airway Management Planned:   Additional Equipment:   Intra-op Plan:   Post-operative Plan:   Informed Consent: I have reviewed the patients History and Physical, chart, labs and discussed the procedure including the risks, benefits and alternatives for the proposed anesthesia with the patient or authorized representative who has indicated his/her understanding and acceptance.     Dental Advisory Given  Plan Discussed with: CRNA  Anesthesia Plan Comments:        Anesthesia Quick Evaluation

## 2023-10-26 NOTE — Progress Notes (Deleted)
 Name: Denise Avila DOB: 12-09-1975 MRN: [id number]  Diagnoses: Post-operative state  HPI: Denise Avila presents post-operatively.  GU History: 1. OAB with urinary frequency, urgency, and urge incontinence. - Not a safe candidate for anticholinergics due to pre-existing dry mouth & dry eyes along with risk for exacerbating her multiple sclerosus.  - Previously took Myrbetriq 50 mg daily (samples) but that was cost prohibitive. - ***Taking Gemtesa 75 mg daily. 2. Kidney stones.  - Has had prior ESWL, URS, and PCNL procedures.  - 11/03/2022: Stone analysis showed 100% calcium oxalate composition. - 02/28/2023: [facility] 24 hour urinalysis showed hypocitraturia, hyperoxaluria, hyperuricosuria. - ***Taking sodium bicarb and Allopurinol for stone prevention. Could not tolerate UrocitK due to hyperkalemia.  - ***Taking Flomax 0.4 mg daily. 3. Prior recurrent UTIs (resolved).   Today She presents s/p the following procedures on 10/26/2023 by Dr. Ronne Binning:  Preoperative diagnosis: Overactive bladder   Postoperative diagnosis: Same   Procedure: Placement of Axonics stage I and impedance check  Postop course: Today She reports ***  She {Actions; denies-reports:120008} redness, warmth, tenderness, swelling, or drainage at the incision site(s). She {Actions; denies-reports:120008} fevers.  OAB: Prior to procedure She reported voiding *** times per day and *** times per night.  Today She reports voiding *** times per day and *** times per night.  Prior to procedure She reported *** urinary urgency. Today She reports *** urinary urgency.  Prior to procedure She reported urinary urge incontinence multiple times per day and wearing *** pads per day. Today She reports urinary urge incontinence *** times per day and wearing *** pads/diapers per day.   Fall Screening: Do you usually have a device to assist in your mobility? No   Medications: No current facility-administered  medications for this visit.   Current Outpatient Medications  Medication Sig Dispense Refill   sulfamethoxazole-trimethoprim (BACTRIM DS) 800-160 MG tablet Take 1 tablet by mouth 2 (two) times daily. 14 tablet 0   traMADol (ULTRAM) 50 MG tablet Take 1 tablet (50 mg total) by mouth every 6 (six) hours as needed. 30 tablet 0   Facility-Administered Medications Ordered in Other Visits  Medication Dose Route Frequency Provider Last Rate Last Admin   dexmedetomidine (PRECEDEX) 80 MCG/20ML   Intravenous Anesthesia Intra-op Julian Reil, CRNA   12 mcg at 10/26/23 1200   fentaNYL (SUBLIMAZE) injection 25-50 mcg  25-50 mcg Intravenous Q5 min PRN Windell Norfolk, MD       fentaNYL (SUBLIMAZE) injection   Intravenous Anesthesia Intra-op Julian Reil, CRNA   25 mcg at 10/26/23 1242   gentamicin (GARAMYCIN) 506 mg in dextrose 5 % 100 mL IVPB   Intravenous Continuous PRN Julian Reil, CRNA   420 mg at 10/26/23 1154   glycopyrrolate (ROBINUL) injection   Intravenous Anesthesia Intra-op Julian Reil, CRNA   0.1 mg at 10/26/23 1150   lactated ringers infusion   Intravenous Continuous Kiel, Mosetta Putt, MD   New Bag at 10/26/23 1140   lidocaine HCl (PF) (XYLOCAINE) 2 % injection   Intradermal Anesthesia Intra-op Julian Reil, CRNA   40 mg at 10/26/23 1151   lidocaine-EPINEPHrine (PF) (XYLOCAINE-EPINEPHrine) 1 %-1:200000 (PF) injection    PRN Malen Gauze, MD   30 mL at 10/26/23 1235   midazolam (VERSED) injection   Intravenous Anesthesia Intra-op Julian Reil, CRNA   2 mg at 10/26/23 1140   ondansetron (ZOFRAN) injection 4 mg  4 mg Intravenous Once PRN Windell Norfolk, MD  ondansetron (ZOFRAN) injection   Intravenous Anesthesia Intra-op Julian Reil, CRNA   4 mg at 10/26/23 1156   oxyCODONE (Oxy IR/ROXICODONE) immediate release tablet 5 mg  5 mg Oral Once PRN Windell Norfolk, MD       Or   oxyCODONE (ROXICODONE) 5 MG/5ML solution 5 mg  5 mg Oral Once PRN Windell Norfolk, MD       propofol  (DIPRIVAN) 10 mg/mL bolus/IV push   Intravenous Anesthesia Intra-op Julian Reil, CRNA   10 mg at 10/26/23 1238   propofol (DIPRIVAN) 500 MG/50ML infusion   Intravenous Continuous PRN Julian Reil, CRNA   Stopped at 10/26/23 1246   water for irrigation, sterile for irrigation SOLN    PRN Malen Gauze, MD   500 mL at 10/26/23 1122    Allergies: Allergies  Allergen Reactions   Penicillins Swelling and Rash    Rash all over [place] and lip swelling. Has patient had a PCN reaction causing immediate rash, facial/tongue/throat swelling, SOB or lightheadedness with hypotension: Yes Has patient had a PCN reaction causing severe rash involving mucus membranes or skin necrosis: Yes Has patient had a PCN reaction that required hospitalization: No If all of the above answers are "NO", then may proceed with Cephalosporin use.   Nortriptyline Other (See Comments)    Severe chest pain     Nsaids Other (See Comments)    Advised not to take due to gastric sleeve    Amoxicillin Rash    Rash all over [place]    Ciprofloxacin Diarrhea and Nausea Only    Past Medical History:  Diagnosis Date   Abnormal Pap smear of cervix 12/2014   Acid reflux    Adrenal insufficiency (HCC)    Anxiety    Arthritis    Arthritis    Depression    Depression    Endometriosis    Fibroids    age 13   Fibromyalgia    High cholesterol    History of kidney stones    Liver disease    per pt report, will have biopsy Nov 08, 2017   Migraines    Pleurisy 02/2015   while hospitalized    Seizure (HCC)    on meds and no seizures in 2-3 years. Unknown etiology   Skin cancer (melanoma) (HCC)    Syncope and collapse    Uterine cancer (HCC)    Past Surgical History:  Procedure Laterality Date   ABDOMINAL HYSTERECTOMY     ABLATION     uterine   COLECTOMY     CYSTOSCOPY W/ URETERAL STENT PLACEMENT Left 02/02/2015   Procedure: CYSTOSCOPY WITH RETROGRADE PYELOGRAM/URETERAL STENT PLACEMENT;  Surgeon: Jerilee Field, MD;  Location: [facility];  Service: Urology;  Laterality: Left;   CYSTOSCOPY WITH RETROGRADE PYELOGRAM, URETEROSCOPY AND STENT PLACEMENT Bilateral 01/20/2022   Procedure: CYSTOSCOPY WITH RETROGRADE PYELOGRAM, URETEROSCOPY AND STENT PLACEMENT;  Surgeon: Malen Gauze, MD;  Location: [facility];  Service: Urology;  Laterality: Bilateral;   CYSTOSCOPY WITH RETROGRADE PYELOGRAM, URETEROSCOPY AND STENT PLACEMENT Left 11/03/2022   Procedure: CYSTOSCOPY WITH RETROGRADE PYELOGRAM, URETEROSCOPY AND STENT PLACEMENT;  Surgeon: Malen Gauze, MD;  Location: [facility];  Service: Urology;  Laterality: Left;  pt knows to arrive at 9:00   CYSTOSCOPY WITH URETEROSCOPY AND STENT PLACEMENT Left 02/13/2015   Procedure: LEFT URETEROSCOPY WITH HOLMIUM LASER AND STENT PLACEMENT;  Surgeon: Jerilee Field, MD;  Location: [facility];  Service: Urology;  Laterality: Left;   DILATION AND  CURETTAGE OF UTERUS     ESOPHAGOGASTRODUODENOSCOPY ENDOSCOPY  10/11/2017   @ [facility]; BIOPSY OF LOWER INTESTINE   EXTRACORPOREAL SHOCK WAVE LITHOTRIPSY Left 09/13/2022   Procedure: EXTRACORPOREAL SHOCK WAVE LITHOTRIPSY (ESWL);  Surgeon: Malen Gauze, MD;  Location: [facility];  Service: Urology;  Laterality: Left;   HOLMIUM LASER APPLICATION N/A 02/13/2015   Procedure: HOLMIUM LASER APPLICATION;  Surgeon: Jerilee Field, MD;  Location: [facility];  Service: Urology;  Laterality: N/A;   IR NEPHROSTOMY PLACEMENT RIGHT  07/05/2021   KNEE SURGERY Bilateral 2019   realignment of knees   LAPAROSCOPIC GASTRIC SLEEVE RESECTION     LAPAROSCOPY     x 4   MELANOMA EXCISION  2000   melanoma removed from face, basal cell removed from nose   NEPHROLITHOTOMY Right 07/08/2021   Procedure: NEPHROLITHOTOMY PERCUTANEOUS- with stent placement;  Surgeon: Malen Gauze, MD;  Location: [facility];  Service: Urology;  Laterality: Right;   REPAIR VAGINAL CUFF N/A 08/21/2015   Procedure: REPAIR VAGINAL CUFF, EXAM UNDER ANESTHESIA;  Surgeon: Adolphus Birchwood,  MD;  Location: [facility];  Service: Gynecology;  Laterality: N/A;   ROBOTIC ASSISTED TOTAL HYSTERECTOMY WITH BILATERAL SALPINGO OOPHERECTOMY Bilateral 05/21/2015   Procedure: XI ROBOTIC ASSISTED TOTAL HYSTERECTOMY WITH BILATERAL SALPINGO OOPHORECTOMY;  Surgeon: Adolphus Birchwood, MD;  Location: [facility];  Service: Gynecology;  Laterality: Bilateral;   STONE EXTRACTION WITH BASKET Bilateral 01/20/2022   Procedure: STONE EXTRACTION WITH BASKET;  Surgeon: Malen Gauze, MD;  Location: [facility];  Service: Urology;  Laterality: Bilateral;   STONE EXTRACTION WITH BASKET Left 11/03/2022   Procedure: STONE EXTRACTION WITH BASKET;  Surgeon: Malen Gauze, MD;  Location: [facility];  Service: Urology;  Laterality: Left;   Family History  Problem Relation Age of Onset   Hypertension Mother    Melanoma Mother    Breast cancer Mother 76   Cancer - Other Father        liver   Diabetes Mellitus II Maternal Grandmother    Heart disease Maternal Grandmother    Breast cancer Maternal Grandmother    Cancer - Lung Maternal Grandfather    Osteoporosis Paternal Grandmother    Melanoma Paternal Grandfather    Social History   Socioeconomic History   Marital status: Married    Spouse name: Not on file   Number of children: 1   Years of education: Not on file   Highest education level: Associate degree: occupational, Scientist, product/process development, or vocational program  Occupational History   Not on file  Tobacco Use   Smoking status: Never   Smokeless tobacco: Never  Vaping Use   Vaping status: Never Used  Substance and Sexual Activity   Alcohol use: No   Drug use: No   Sexual activity: Yes  Other Topics Concern   Not on file  Social History Narrative   Lives at home with family. Currently living with mother to help take care of her    Right handed   No Caffeine   Social Drivers of Corporate investment banker Strain: Low Risk  (07/24/2023)   Received from [facility]   Overall Financial Resource  Strain (CARDIA)    Difficulty of Paying Living Expenses: Not hard at all  Food Insecurity: No Food Insecurity (07/24/2023)   Received from [facility]   Hunger Vital Sign    Worried About Running Out of Food in the Last Year: Never true    Ran Out of Food in the Last  Year: Never true  Transportation Needs: No Transportation Needs (07/24/2023)   Received from [facility] - Transportation    In the past 12 months, has lack of transportation kept you from medical appointments or from getting medications?: No    Lack of Transportation (Non-Medical): No  Physical Activity: Not on file  Stress: Not on file  Social Connections: Unknown (02/01/2022)   Received from [facility]   Social Network    Social Network: Not on file  Intimate Partner Violence: Unknown (01/05/2022)   Received from [facility]   HITS    Physically Hurt: Not on file    Insult or Talk Down To: Not on file    Threaten Physical Harm: Not on file    Scream or Curse: Not on file    SUBJECTIVE  Review of Systems Constitutional: Patient denies any unintentional weight loss or change in strength lntegumentary: Patient denies any rashes or pruritus Cardiovascular: Patient denies chest pain or syncope Respiratory: Patient denies shortness of breath Gastrointestinal: Patient ***denies nausea, vomiting, constipation, or diarrhea Musculoskeletal: Patient denies muscle cramps or weakness Neurologic: Patient denies convulsions or seizures Allergic/Immunologic: Patient denies recent allergic reaction(s) Hematologic/Lymphatic: Patient denies bleeding tendencies Endocrine: Patient denies heat/cold intolerance  GU: As per HPI.  OBJECTIVE There were no vitals filed for this visit. There is no height or weight on file to calculate BMI.  Physical Examination Constitutional: No obvious distress; patient is non-toxic appearing  Cardiovascular: No  visible lower extremity edema.  Respiratory: The patient does not have audible wheezing/stridor; respirations do not appear labored  Gastrointestinal: Abdomen non-distended Musculoskeletal: Normal ROM of UEs  Skin: *** Incision well approximated and intact, dry, and without erythema, warmth, or drainage.  Neurologic: CN 2-12 grossly intact Psychiatric: Answered questions appropriately with normal affect  Hematologic/Lymphatic/Immunologic: No obvious bruises or sites of spontaneous bleeding  UA: ***negative / *** WBC/hpf, *** RBC/hpf, *** bacteria ***Urine microscopy: ***negative / *** WBC/hpf, *** RBC/hpf, *** bacteria ***with no evidence of UTI ***with no evidence of microscopic hematuria ***otherwise unremarkable  PVR: *** ml  ASSESSMENT: No diagnosis found. We reviewed the operative procedures and findings. Pre-operative symptoms are ***>50% improved ***unchanged since the procedure.  Patient elects to ***proceed with Axonics Stage 2 procedure as scheduled on 11/09/2023 with Dr. Ronne Binning.  All questions were answered.  PLAN: 1. ***Proceed with Axonics Stage 2 procedure as scheduled. 2. ***No follow-ups on file.  No orders of the defined types were placed in this encounter.   It has been explained that the patient is to follow regularly with their PCP in addition to all other providers involved in their care and to follow instructions provided by these respective offices. Patient advised to contact urology clinic if any urologic-pertaining questions, concerns, new symptoms or problems arise in the interim period.  There are no Patient Instructions on file for this visit.  Electronically signed by:  Donnita Falls, MSN, FNP-C, CUNP 10/26/2023 1:02 PM

## 2023-10-26 NOTE — Op Note (Signed)
Preoperative diagnosis: Overactive bladder  Postoperative diagnosis: Same  Procedure: Placement of Axonics stage I and impedance check  Surgeon: Dr. Wilkie Aye  Assistant: None  Antibiotics: Ancef  Drains: None  Indications: The patient is a 47yo with a hx of Overactive bladder refractory to medical therapy. After discussing treatment options she has elected to proceed with Axonics Stage 1 implantation  Procedure in detail: Prior to the procedure consent was obtained. The patient was brought to the operating room and a brief time out was completed to ensure correct patient, correct procedure and correct site. Preoperative antibiotics were given. Extra care was taken positioning the patient in a prone position. Usual skin preparation was utilized.  Using lateral and AP fluoroscopy I marked S3. After several minutes of testing I was in the S4 foramina with a bellows response and the foramina was below the bone knuckle noted on x-ray. Eventually I was in S3 on the patient's left side above the knuckle.  Approximate 12 mL of a lidocaine epinephrine mixture was utilized in the midline. Bony table was anesthetized. The 5 inch foramen needle was introduced into the S3 foramina as noted above. At very low setting she had an excellent bellows and toe response  The inner aspect of the foramen needle was removed and the guide was placed to the appropriate depth removing the framing needle. Under fluoroscopic guidance after making a 1 cm incision the white trocar was passed to the appropriate depth. The lead with a hockey stick bend was placed to the appropriate depth just bridging the bone medially. She had excellent bellows and toe response at all 4 settings.  Under fluoroscopic guidance the inner aspect of the lead was removed. X-rays were taken.  A 2 cm left upper mid buttock incision was made. 10 mL of a lidocaine epinephrine mixture were utilized. It was carried down to appropriate depth  and a made an appropriate sized pocket.  With the passer I passed the lead from medial to lateral and connected. We then passed the lead across the midline and to the right upper buttock. The leas was then brought through this incision. The incisions were then closed with running 3-0 vicryl.  Impedance check was done utilizing sterile technique and was normal in all 4 positions We then placed dermabond on the incisions and this concluded the procedure which was well tolerated by the patient.  Complications: none  Condition: stable, transferred to PACU  Plan: The patient is to be discharged home after she voids in the PACU. If she cannot void the foley will be replaced and she will followup in 2-3 days for a second voiding trial. If she is able to void she will keep a voiding diary and will be reassessed in 1 week for response.

## 2023-10-26 NOTE — Transfer of Care (Signed)
Immediate Anesthesia Transfer of Care Note  Patient: Denise Avila  Procedure(s) Performed: Leane Platt IMPLANT FIRST STAGE (Back)  Patient Location: PACU  Anesthesia Type:General  Level of Consciousness: awake, alert , and oriented  Airway & Oxygen Therapy: Patient Spontanous Breathing  Post-op Assessment: Report given to RN and Post -op Vital signs reviewed and stable  Post vital signs: Reviewed and stable  Last Vitals:  Vitals Value Taken Time  BP 113/64 10/26/23 1300  Temp 97.7   Pulse 78 10/26/23 1301  Resp 14 10/26/23 1301  SpO2 96 % 10/26/23 1301  Vitals shown include unfiled device data.  Last Pain:  Vitals:   10/26/23 1041  TempSrc: Oral  PainSc: 4          Complications: No notable events documented.

## 2023-10-26 NOTE — Anesthesia Procedure Notes (Signed)
Date/Time: 10/26/2023 11:44 AM  Performed by: Julian Reil, CRNAPre-anesthesia Checklist: Patient identified, Emergency Drugs available, Suction available and Patient being monitored Patient Re-evaluated:Patient Re-evaluated prior to induction Oxygen Delivery Method: Nasal cannula Induction Type: IV induction Placement Confirmation: positive ETCO2

## 2023-10-27 ENCOUNTER — Encounter (HOSPITAL_COMMUNITY): Payer: Self-pay | Admitting: Urology

## 2023-10-30 ENCOUNTER — Other Ambulatory Visit: Payer: Self-pay

## 2023-10-30 ENCOUNTER — Telehealth: Payer: Self-pay

## 2023-10-30 ENCOUNTER — Other Ambulatory Visit: Payer: Self-pay | Admitting: Urology

## 2023-10-30 DIAGNOSIS — N3281 Overactive bladder: Secondary | ICD-10-CM

## 2023-10-30 DIAGNOSIS — B379 Candidiasis, unspecified: Secondary | ICD-10-CM

## 2023-10-30 DIAGNOSIS — R350 Frequency of micturition: Secondary | ICD-10-CM

## 2023-10-30 MED ORDER — DOXYCYCLINE HYCLATE 100 MG PO CAPS: 100.0000 mg | ORAL_CAPSULE | Freq: Two times a day (BID) | ORAL | 0 refills | Status: DC

## 2023-10-30 MED ORDER — FLUCONAZOLE 150 MG PO TABS: ORAL_TABLET | ORAL | 0 refills | Status: DC

## 2023-10-30 NOTE — Anesthesia Postprocedure Evaluation (Signed)
Anesthesia Post Note  Patient: Denise Avila  Procedure(s) Performed: Leane Platt IMPLANT FIRST STAGE (Back)  Patient location during evaluation: Phase II Anesthesia Type: General Level of consciousness: awake Pain management: pain level controlled Vital Signs Assessment: post-procedure vital signs reviewed and stable Respiratory status: spontaneous breathing and respiratory function stable Cardiovascular status: blood pressure returned to baseline and stable Postop Assessment: no headache and no apparent nausea or vomiting Anesthetic complications: no Comments: Late entry   No notable events documented.   Last Vitals:  Vitals:   10/26/23 1400 10/26/23 1404  BP:  103/77  Pulse: 64 76  Resp: 14 16  Temp:  36.6 C  SpO2: 96% 100%    Last Pain:  Vitals:   10/26/23 1404  TempSrc: Oral  PainSc: 2                  Denise Avila

## 2023-10-30 NOTE — Telephone Encounter (Signed)
Advise

## 2023-10-30 NOTE — Telephone Encounter (Signed)
Patient was made aware Per Maralyn Sago "She needs to stop taking the Bactrim immediately. Please add that to her allergy list. I will send in Diflucan for antibiotic-induced yeast vaginitis and ask Dr. Ronne Binning if he thinks she needs another antibiotic or not." Per Dr. Ronne Binning send in Doxycycline to cover infection.  Patient states her inter stem is leaking from multiplied incision and patient was offer appointment to see NP. Patient voiced understanding

## 2023-10-30 NOTE — Telephone Encounter (Signed)
FYI and advise

## 2023-10-31 ENCOUNTER — Encounter: Payer: Self-pay | Admitting: Urology

## 2023-10-31 ENCOUNTER — Ambulatory Visit: Payer: Medicare HMO | Admitting: Urology

## 2023-10-31 VITALS — BP 110/77 | HR 96 | Temp 98.7°F

## 2023-10-31 DIAGNOSIS — N3281 Overactive bladder: Secondary | ICD-10-CM | POA: Diagnosis not present

## 2023-10-31 DIAGNOSIS — Z09 Encounter for follow-up examination after completed treatment for conditions other than malignant neoplasm: Secondary | ICD-10-CM

## 2023-10-31 DIAGNOSIS — T3695XA Adverse effect of unspecified systemic antibiotic, initial encounter: Secondary | ICD-10-CM

## 2023-10-31 DIAGNOSIS — N3941 Urge incontinence: Secondary | ICD-10-CM

## 2023-10-31 LAB — URINALYSIS, ROUTINE W REFLEX MICROSCOPIC
Bilirubin, UA: NEGATIVE
Glucose, UA: NEGATIVE
Ketones, UA: NEGATIVE
Leukocytes,UA: NEGATIVE
Nitrite, UA: NEGATIVE
Protein,UA: NEGATIVE
RBC, UA: NEGATIVE
Specific Gravity, UA: 1.02 (ref 1.005–1.030)
Urobilinogen, Ur: 0.2 mg/dL (ref 0.2–1.0)
pH, UA: 6 (ref 5.0–7.5)

## 2023-10-31 NOTE — Progress Notes (Signed)
Name: Denise Avila DOB: 1976-04-22 MRN: [id number]  Diagnoses: Post-operative state  HPI: She presents postoperatively. She is accompanied by her husband. GU History: 1. OAB with urinary frequency, urgency, and urge incontinence. - Not a safe candidate for anticholinergics due to pre-existing dry mouth & dry eyes along with risk for exacerbating her multiple sclerosus.  - Previously took Myrbetriq 50 mg daily (samples) but that was cost prohibitive. - Taking Gemtesa 75 mg daily. 2. Kidney stones.  - Has had prior ESWL, URS, and PCNL procedures.  - 11/03/2022: Stone analysis showed 100% calcium oxalate composition. - 02/28/2023: [facility]ink 24 hour urinalysis showed hypocitraturia, hyperoxaluria, hyperuricosuria. - Taking sodium bicarb and Allopurinol for stone prevention. Could not tolerate UrocitK due to hyperkalemia.  - Taking Flomax 0.4 mg daily. 3. Prior recurrent UTIs (resolved).   Today She presents s/p the following procedures on 10/26/2023 by Dr. Ronne Binning:  Preoperative diagnosis: Overactive bladder   Postoperative diagnosis: Same   Procedure: Placement of Axonics stage I and impedance check  Postop course: > Yesterday (10/30/2023): - Patient reported "The antibiotics that I am on [Bactrim] have caused a really bad yeast infection" and "a pretty bad rash all around my torso and it going up my neck and the incision places are very itchy also."  - She was instructed to stop taking the Bactrim immediately (it was added to her allergy list).  - Diflucan prescribed for antibiotic-induced yeast vaginitis. - Dr. Ronne Binning prescribed Doxycycline as alternative postop antibiotic to prevent infection of her surgical site / Axonics implant.   > Today: She reports concern for leakage at her surgical site and possible fever, although she hasn't checked her temperature.   She reports significant improvement of her OAB symptoms since the procedure. Before surgery she had significantly  bothersome urinary urgency, frequency, and urge incontinence multiple times per day. Now she reports minimal urinary urgency, being able to wait several hours between voids, and only 1-2 minor episodes of urine leakage since the procedure.    Fall Screening: Do you usually have a device to assist in your mobility? No   Medications: Current Outpatient Medications  Medication Sig Dispense Refill   acetaminophen (TYLENOL) 650 MG CR tablet Take 1,300 mg by mouth in the morning, at noon, and at bedtime.     allopurinol (ZYLOPRIM) 300 MG tablet Take 1 tablet (300 mg total) by mouth at bedtime. 90 tablet 3   baclofen (LIORESAL) 10 MG tablet Take 20 mg by mouth daily.     cetirizine (ZYRTEC) 10 MG tablet Take 10 mg by mouth daily.     citalopram (CELEXA) 20 MG tablet Take 20 mg by mouth daily.     clonazePAM (KLONOPIN) 0.5 MG tablet Take 1.5 mg by mouth at bedtime.  0   diphenhydrAMINE (BENADRYL) 25 MG tablet Take 25 mg by mouth every 6 (six) hours as needed (take with for migraines).     doxycycline (VIBRAMYCIN) 100 MG capsule Take 1 capsule (100 mg total) by mouth every 12 (twelve) hours. 14 capsule 0   DULoxetine (CYMBALTA) 60 MG capsule Take 120 mg by mouth 2 (two) times daily.     ezetimibe (ZETIA) 10 MG tablet Take 10 mg by mouth in the morning.     famotidine (PEPCID) 40 MG tablet Take 40 mg by mouth 2 (two) times daily.     fluconazole (DIFLUCAN) 150 MG tablet Take once. May repeat in 3 days if symptoms fail to fully resolve after first dose. 2 tablet 0  gabapentin (NEURONTIN) 800 MG tablet Take 2 tablets (1,600 mg total) by mouth 2 (two) times daily. 30 tablet 8   lamoTRIgine (LAMICTAL) 150 MG tablet Take 150 mg by mouth 2 (two) times daily.     MAGNESIUM PO Take 1 tablet by mouth daily.     meclizine (ANTIVERT) 12.5 MG tablet Take 12.5 mg by mouth 3 (three) times daily as needed for dizziness.     Multiple Vitamins-Minerals (BARIATRIC MULTIVITAMINS) CAPS Take 1 capsule by mouth daily.      naratriptan (AMERGE) 2.5 MG tablet Take 2.5 mg by mouth as needed for migraine.     omeprazole (PRILOSEC) 40 MG capsule Take 40 mg by mouth 2 (two) times daily.     ondansetron (ZOFRAN) 4 MG tablet Take 1 tablet (4 mg total) by mouth daily as needed for nausea or vomiting. 30 tablet 1   phenazopyridine (PYRIDIUM) 200 MG tablet Take 200 mg by mouth 3 (three) times daily as needed for pain.     phentermine 15 MG capsule Take 15 mg by mouth daily.     Polyethyl Glycol-Propyl Glycol (SYSTANE) 0.4-0.3 % GEL ophthalmic gel Place 1 Application into both eyes 4 (four) times daily as needed (dry eyes).     prazosin (MINIPRESS) 1 MG capsule Take 1 mg by mouth at bedtime.     pregabalin (LYRICA) 25 MG capsule Take 100 mg by mouth 3 (three) times daily.     prochlorperazine (COMPAZINE) 10 MG tablet Take 10 mg by mouth every 6 (six) hours as needed (migraines (nausea)).     promethazine (PHENERGAN) 25 MG tablet Take 1 tablet (25 mg total) by mouth 4 (four) times daily as needed for nausea or vomiting. 30 tablet 0   QUEtiapine (SEROQUEL) 50 MG tablet Take 50 mg by mouth at bedtime.     RABEprazole (ACIPHEX) 20 MG tablet Take 1 tablet by mouth 2 (two) times daily.     rosuvastatin (CRESTOR) 5 MG tablet Take 5 mg by mouth at bedtime.     Simethicone (GAS RELIEF) 250 MG CAPS Take 250 mg by mouth 3 (three) times daily as needed (gas/bloating).     sodium bicarbonate 650 MG tablet Take 1 tablet (650 mg total) by mouth 2 (two) times daily. 60 tablet 11   tamsulosin (FLOMAX) 0.4 MG CAPS capsule TAKE 1 CAPSULE EVERY DAY AFTER SUPPER 90 capsule 3   tiZANidine (ZANAFLEX) 4 MG tablet Take 4 mg by mouth daily.     traMADol (ULTRAM) 50 MG tablet Take 1 tablet (50 mg total) by mouth every 6 (six) hours as needed. 30 tablet 0   Ubrogepant (UBRELVY) 100 MG TABS Take 100 mg by mouth daily as needed (migraine).     Vibegron (GEMTESA) 75 MG TABS Take 1 tablet (75 mg total) by mouth daily. 30 tablet 11   No current  facility-administered medications for this visit.    Allergies: Allergies  Allergen Reactions   Penicillins Swelling and Rash    Rash all over [place] and lip swelling. Has patient had a PCN reaction causing immediate rash, facial/tongue/throat swelling, SOB or lightheadedness with hypotension: Yes Has patient had a PCN reaction causing severe rash involving mucus membranes or skin necrosis: Yes Has patient had a PCN reaction that required hospitalization: No If all of the above answers are "NO", then may proceed with Cephalosporin use.   Cyanoacrylate Dermatitis   Nortriptyline Other (See Comments)    Severe chest pain     Nsaids Other (See Comments)  Advised not to take due to gastric sleeve    Amoxicillin Rash    Rash all over [place]    Bactrim [Sulfamethoxazole-Trimethoprim] Rash   Ciprofloxacin Diarrhea and Nausea Only    Past Medical History:  Diagnosis Date   Abnormal Pap smear of cervix 12/2014   Acid reflux    Adrenal insufficiency (HCC)    Anxiety    Arthritis    Arthritis    Depression    Depression    Endometriosis    Fibroids    age 76   Fibromyalgia    High cholesterol    History of kidney stones    Liver disease    per pt report, will have biopsy Nov 08, 2017   Migraines    Pleurisy 02/2015   while hospitalized    Seizure (HCC)    on meds and no seizures in 2-3 years. Unknown etiology   Skin cancer (melanoma) (HCC)    Syncope and collapse    Uterine cancer (HCC)    Past Surgical History:  Procedure Laterality Date   ABDOMINAL HYSTERECTOMY     ABLATION     uterine   COLECTOMY     CYSTOSCOPY W/ URETERAL STENT PLACEMENT Left 02/02/2015   Procedure: CYSTOSCOPY WITH RETROGRADE PYELOGRAM/URETERAL STENT PLACEMENT;  Surgeon: Jerilee Field, MD;  Location: [facility];  Service: Urology;  Laterality: Left;   CYSTOSCOPY WITH RETROGRADE PYELOGRAM, URETEROSCOPY AND STENT PLACEMENT Bilateral 01/20/2022   Procedure: CYSTOSCOPY WITH RETROGRADE PYELOGRAM,  URETEROSCOPY AND STENT PLACEMENT;  Surgeon: Malen Gauze, MD;  Location: [facility];  Service: Urology;  Laterality: Bilateral;   CYSTOSCOPY WITH RETROGRADE PYELOGRAM, URETEROSCOPY AND STENT PLACEMENT Left 11/03/2022   Procedure: CYSTOSCOPY WITH RETROGRADE PYELOGRAM, URETEROSCOPY AND STENT PLACEMENT;  Surgeon: Malen Gauze, MD;  Location: [facility];  Service: Urology;  Laterality: Left;  pt knows to arrive at 9:00   CYSTOSCOPY WITH URETEROSCOPY AND STENT PLACEMENT Left 02/13/2015   Procedure: LEFT URETEROSCOPY WITH HOLMIUM LASER AND STENT PLACEMENT;  Surgeon: Jerilee Field, MD;  Location: [facility];  Service: Urology;  Laterality: Left;   DILATION AND CURETTAGE OF UTERUS     ESOPHAGOGASTRODUODENOSCOPY ENDOSCOPY  10/11/2017   @ [facility]; BIOPSY OF LOWER INTESTINE   EXTRACORPOREAL SHOCK WAVE LITHOTRIPSY Left 09/13/2022   Procedure: EXTRACORPOREAL SHOCK WAVE LITHOTRIPSY (ESWL);  Surgeon: Malen Gauze, MD;  Location: [facility];  Service: Urology;  Laterality: Left;   HOLMIUM LASER APPLICATION N/A 02/13/2015   Procedure: HOLMIUM LASER APPLICATION;  Surgeon: Jerilee Field, MD;  Location: [facility];  Service: Urology;  Laterality: N/A;   INTERSTIM IMPLANT PLACEMENT N/A 10/26/2023   Procedure: Leane Platt IMPLANT FIRST STAGE;  Surgeon: Malen Gauze, MD;  Location: [facility];  Service: Urology;  Laterality: N/A;   IR NEPHROSTOMY PLACEMENT RIGHT  07/05/2021   KNEE SURGERY Bilateral 2019   realignment of knees   LAPAROSCOPIC GASTRIC SLEEVE RESECTION     LAPAROSCOPY     x 4   MELANOMA EXCISION  2000   melanoma removed from face, basal cell removed from nose   NEPHROLITHOTOMY Right 07/08/2021   Procedure: NEPHROLITHOTOMY PERCUTANEOUS- with stent placement;  Surgeon: Malen Gauze, MD;  Location: [facility];  Service: Urology;  Laterality: Right;   REPAIR VAGINAL CUFF N/A 08/21/2015   Procedure: REPAIR VAGINAL CUFF, EXAM UNDER ANESTHESIA;  Surgeon: Adolphus Birchwood, MD;  Location: [facility];  Service:  Gynecology;  Laterality: N/A;   ROBOTIC ASSISTED TOTAL HYSTERECTOMY WITH BILATERAL SALPINGO OOPHERECTOMY Bilateral 05/21/2015   Procedure: XI  ROBOTIC ASSISTED TOTAL HYSTERECTOMY WITH BILATERAL SALPINGO OOPHORECTOMY;  Surgeon: Adolphus Birchwood, MD;  Location: [facility];  Service: Gynecology;  Laterality: Bilateral;   STONE EXTRACTION WITH BASKET Bilateral 01/20/2022   Procedure: STONE EXTRACTION WITH BASKET;  Surgeon: Malen Gauze, MD;  Location: [facility];  Service: Urology;  Laterality: Bilateral;   STONE EXTRACTION WITH BASKET Left 11/03/2022   Procedure: STONE EXTRACTION WITH BASKET;  Surgeon: Malen Gauze, MD;  Location: [facility];  Service: Urology;  Laterality: Left;   Family History  Problem Relation Age of Onset   Hypertension Mother    Melanoma Mother    Breast cancer Mother 38   Cancer - Other Father        liver   Diabetes Mellitus II Maternal Grandmother    Heart disease Maternal Grandmother    Breast cancer Maternal Grandmother    Cancer - Lung Maternal Grandfather    Osteoporosis Paternal Grandmother    Melanoma Paternal Grandfather    Social History   Socioeconomic History   Marital status: Married    Spouse name: Not on file   Number of children: 1   Years of education: Not on file   Highest education level: Associate degree: occupational, Scientist, product/process development, or vocational program  Occupational History   Not on file  Tobacco Use   Smoking status: Never   Smokeless tobacco: Never  Vaping Use   Vaping status: Never Used  Substance and Sexual Activity   Alcohol use: No   Drug use: No   Sexual activity: Yes  Other Topics Concern   Not on file  Social History Narrative   Lives at home with family. Currently living with mother to help take care of her    Right handed   No Caffeine   Social Drivers of Corporate investment banker Strain: Low Risk  (07/24/2023)   Received [facility]   Overall Financial Resource Strain (CARDIA)    Difficulty of  Paying Living Expenses: Not hard at all  Food Insecurity: No Food Insecurity (07/24/2023)   Received from [facility]   Hunger Vital Sign    Worried About Running Out of Food in the Last Year: Never true    Ran Out of Food in the Last Year: Never true  Transportation Needs: No Transportation Needs (07/24/2023)   Received from [facility] - Transportation    In the past 12 months, has lack of transportation kept you from medical appointments or from getting medications?: No    Lack of Transportation (Non-Medical): No  Physical Activity: Not on file  Stress: Not on file  Social Connections: Unknown (02/01/2022)   Received from [facility]   Social Network    Social Network: Not on file  Intimate Partner Violence: Unknown (01/05/2022)   Received from [facility]   HITS    Physically Hurt: Not on file    Insult or Talk Down To: Not on file    Threaten Physical Harm: Not on file    Scream or Curse: Not on file    SUBJECTIVE  Review of Systems Constitutional: Patient denies any unintentional weight loss or change in strength lntegumentary: Patient denies any rashes or pruritus Cardiovascular: Patient denies chest pain or syncope Respiratory: Patient denies shortness of breath Gastrointestinal: Patient denies nausea, vomiting, constipation, or diarrhea Musculoskeletal: Patient denies muscle cramps or weakness Neurologic: Patient denies convulsions or seizures Allergic/Immunologic: Patient denies recent allergic reaction(s)  Hematologic/Lymphatic: Patient denies bleeding tendencies Endocrine: Patient denies heat/cold intolerance  GU: As per HPI.  OBJECTIVE Vitals:   10/31/23 1147  BP: 110/77  Pulse: 96  Temp: 98.7 F (37.1 C)   There is no height or weight on file to calculate BMI.  Physical Examination Constitutional: No obvious distress; patient is non-toxic appearing  Cardiovascular: No visible  lower extremity edema.  Respiratory: The patient does not have audible wheezing/stridor; respirations do not appear labored  Gastrointestinal: Abdomen non-distended Musculoskeletal: Normal ROM of UEs  Skin: Diffuse erythematous papular rash noted across back and lower abdomen  Neurologic: CN 2-12 grossly intact Psychiatric: Answered questions appropriately with normal affect  Hematologic/Lymphatic/Immunologic: No obvious bruises or sites of spontaneous bleeding  GU: Scab noted over sacral stab incision approximately 1 inch lateral to midline right buttock. Remaining surgical site dressed with 4x4 gauze which appears clean and dry under large Tegaderm adhesive. No evidence of localized contact dermatitis, bleeding, discharge, or drainage. No surrounding erythema, edema, crepitus, fluctuance, warmth, or significant tenderness to palpation.   UA: negative  PVR: 0 ml  ASSESSMENT OAB (overactive bladder) - Plan: Urinalysis, Routine w reflex microscopic  Urge incontinence - Plan: Urinalysis, Routine w reflex microscopic  Postop check - Plan: Urinalysis, Routine w reflex microscopic  Allergic reaction due to antibacterial drug  We reviewed the operative procedures and findings. Surgical site healing well; no acute findings / no evidence of surgical site infection. Afebrile. Pre-operative symptoms are >50% improved since the procedure. Patient elects to proceed with Axonics Stage 2 procedure as scheduled on 11/09/2023 with Dr. Ronne Binning.  We discussed allergic reaction to Bactrim / sulfa drug (rash). That has been added to her allergy list along with Dermabond, which she states causes significant localized skin irritation / itching.   All questions were answered.   PLAN: 1. Proceed with Axonics Stage 2 procedure as scheduled.   Orders Placed This Encounter  Procedures   Urinalysis, Routine w reflex microscopic    It has been explained that the patient is to follow regularly with their PCP  in addition to all other providers involved in their care and to follow instructions provided by these respective offices. Patient advised to contact urology clinic if any urologic-pertaining questions, concerns, new symptoms or problems arise in the interim period.  There are no Patient Instructions on file for this visit.  Electronically signed by:  Donnita Falls, MSN, FNP-C, CUNP 10/31/2023 12:34 PM

## 2023-11-06 ENCOUNTER — Ambulatory Visit: Payer: Medicare HMO | Admitting: Urology

## 2023-11-06 ENCOUNTER — Telehealth: Payer: Self-pay

## 2023-11-06 DIAGNOSIS — N3281 Overactive bladder: Secondary | ICD-10-CM

## 2023-11-06 DIAGNOSIS — R350 Frequency of micturition: Secondary | ICD-10-CM

## 2023-11-06 DIAGNOSIS — N3941 Urge incontinence: Secondary | ICD-10-CM

## 2023-11-06 DIAGNOSIS — R3915 Urgency of urination: Secondary | ICD-10-CM

## 2023-11-06 DIAGNOSIS — Z09 Encounter for follow-up examination after completed treatment for conditions other than malignant neoplasm: Secondary | ICD-10-CM

## 2023-11-06 NOTE — Telephone Encounter (Addendum)
-----   Message from Donnita Falls sent at 11/06/2023 10:36 AM EST ----- No refill thanks ----- Message ----- From: Troy Sine, CMA Sent: 11/06/2023  10:10 AM EST To: Donnita Falls, FNP  Will you like to refill meds? Did not see anything that indicated need for this medication other than a message I sent you a while ago about patient following up with GYN.

## 2023-11-07 ENCOUNTER — Encounter (HOSPITAL_COMMUNITY): Payer: Self-pay

## 2023-11-07 ENCOUNTER — Encounter (HOSPITAL_COMMUNITY)
Admission: RE | Admit: 2023-11-07 | Discharge: 2023-11-07 | Disposition: A | Discharge status: Home / Self Care | Payer: Medicare HMO | Source: Ambulatory Visit | Attending: Urology | Admitting: Urology

## 2023-11-08 MED ORDER — GENTAMICIN SULFATE 40 MG/ML IJ SOLN
5.0000 mg/kg | INTRAVENOUS | Status: AC
  Administered 2023-11-09: 420 mg via INTRAVENOUS
  Filled 2023-11-08: qty 10.5

## 2023-11-08 NOTE — Anesthesia Preprocedure Evaluation (Addendum)
 Anesthesia Evaluation  Patient identified by MRN, date of birth, ID band Patient awake    Reviewed: Allergy & Precautions, H&P , NPO status , Patient's Chart, lab work & pertinent test results, reviewed documented beta blocker date and time   Airway Mallampati: II  TM Distance: >3 FB Neck ROM: full    Dental  (+) Dental Advisory Given, Caps Front upper 6 teeth are a permanent bridge:   Pulmonary neg pulmonary ROS   Pulmonary exam normal breath sounds clear to auscultation       Cardiovascular Exercise Tolerance: Good hypertension, Normal cardiovascular exam Rhythm:regular Rate:Normal  syncope   Neuro/Psych  Headaches, Seizures -,  PSYCHIATRIC DISORDERS Anxiety Depression     Neuromuscular disease  negative psych ROS   GI/Hepatic negative GI ROS,GERD  ,,? Liver disease   Endo/Other  Adrenal insufficiency  Renal/GU   negative genitourinary   Musculoskeletal  (+) Arthritis , Osteoarthritis,  Fibromyalgia -  Abdominal   Peds  Hematology negative hematology ROS (+)   Anesthesia Other Findings   Reproductive/Obstetrics negative OB ROS                             Anesthesia Physical Anesthesia Plan  ASA: 3  Anesthesia Plan: General   Post-op Pain Management: Minimal or no pain anticipated   Induction: Intravenous  PONV Risk Score and Plan: Propofol  infusion  Airway Management Planned: Nasal Cannula and Natural Airway  Additional Equipment: None  Intra-op Plan:   Post-operative Plan:   Informed Consent: I have reviewed the patients History and Physical, chart, labs and discussed the procedure including the risks, benefits and alternatives for the proposed anesthesia with the patient or authorized representative who has indicated his/her understanding and acceptance.     Dental Advisory Given  Plan Discussed with: CRNA  Anesthesia Plan Comments:        Anesthesia Quick  Evaluation

## 2023-11-09 ENCOUNTER — Ambulatory Visit (HOSPITAL_COMMUNITY): Payer: Medicare HMO | Admitting: Certified Registered Nurse Anesthetist

## 2023-11-09 ENCOUNTER — Encounter (HOSPITAL_COMMUNITY)
Admission: RE | Disposition: A | Discharge status: Home / Self Care | Payer: Self-pay | Source: Home / Self Care | Attending: Urology

## 2023-11-09 ENCOUNTER — Other Ambulatory Visit: Payer: Self-pay

## 2023-11-09 ENCOUNTER — Ambulatory Visit (HOSPITAL_COMMUNITY)
Admission: RE | Admit: 2023-11-09 | Discharge: 2023-11-09 | Disposition: A | Discharge status: Home / Self Care | Payer: Medicare HMO | Attending: Urology | Admitting: Urology

## 2023-11-09 ENCOUNTER — Encounter (HOSPITAL_COMMUNITY): Payer: Self-pay | Admitting: Urology

## 2023-11-09 DIAGNOSIS — E875 Hyperkalemia: Secondary | ICD-10-CM | POA: Diagnosis not present

## 2023-11-09 DIAGNOSIS — Z79899 Other long term (current) drug therapy: Secondary | ICD-10-CM | POA: Insufficient documentation

## 2023-11-09 DIAGNOSIS — Z87442 Personal history of urinary calculi: Secondary | ICD-10-CM | POA: Insufficient documentation

## 2023-11-09 DIAGNOSIS — N3281 Overactive bladder: Secondary | ICD-10-CM | POA: Diagnosis present

## 2023-11-09 DIAGNOSIS — K219 Gastro-esophageal reflux disease without esophagitis: Secondary | ICD-10-CM | POA: Insufficient documentation

## 2023-11-09 DIAGNOSIS — R569 Unspecified convulsions: Secondary | ICD-10-CM | POA: Insufficient documentation

## 2023-11-09 DIAGNOSIS — E2740 Unspecified adrenocortical insufficiency: Secondary | ICD-10-CM | POA: Insufficient documentation

## 2023-11-09 DIAGNOSIS — F419 Anxiety disorder, unspecified: Secondary | ICD-10-CM | POA: Insufficient documentation

## 2023-11-09 DIAGNOSIS — I10 Essential (primary) hypertension: Secondary | ICD-10-CM | POA: Diagnosis not present

## 2023-11-09 DIAGNOSIS — F418 Other specified anxiety disorders: Secondary | ICD-10-CM | POA: Diagnosis not present

## 2023-11-09 DIAGNOSIS — B379 Candidiasis, unspecified: Secondary | ICD-10-CM

## 2023-11-09 DIAGNOSIS — F32A Depression, unspecified: Secondary | ICD-10-CM | POA: Diagnosis not present

## 2023-11-09 DIAGNOSIS — M797 Fibromyalgia: Secondary | ICD-10-CM | POA: Diagnosis not present

## 2023-11-09 DIAGNOSIS — M1990 Unspecified osteoarthritis, unspecified site: Secondary | ICD-10-CM | POA: Insufficient documentation

## 2023-11-09 DIAGNOSIS — R55 Syncope and collapse: Secondary | ICD-10-CM | POA: Insufficient documentation

## 2023-11-09 DIAGNOSIS — R350 Frequency of micturition: Secondary | ICD-10-CM

## 2023-11-09 HISTORY — PX: INTERSTIM IMPLANT PLACEMENT: SHX5130

## 2023-11-09 SURGERY — INSERTION, SACRAL NERVE STIMULATOR, INTERSTIM, STAGE 2
Anesthesia: General | Site: Buttocks

## 2023-11-09 MED ORDER — MIDAZOLAM HCL 2 MG/2ML IJ SOLN
INTRAMUSCULAR | Status: AC
  Filled 2023-11-09: qty 2

## 2023-11-09 MED ORDER — DOXYCYCLINE HYCLATE 100 MG PO CAPS: 100.0000 mg | ORAL_CAPSULE | Freq: Two times a day (BID) | ORAL | 0 refills | Status: DC

## 2023-11-09 MED ORDER — LACTATED RINGERS IV SOLN: INTRAVENOUS | Status: DC | PRN

## 2023-11-09 MED ORDER — DEXAMETHASONE SODIUM PHOSPHATE 10 MG/ML IJ SOLN
INTRAMUSCULAR | Status: AC
  Filled 2023-11-09: qty 1

## 2023-11-09 MED ORDER — OXYCODONE HCL 5 MG/5ML PO SOLN: 5.0000 mg | Freq: Once | ORAL | Status: AC | PRN

## 2023-11-09 MED ORDER — CHLORHEXIDINE GLUCONATE 0.12 % MT SOLN
15.0000 mL | Freq: Once | OROMUCOSAL | Status: AC
Start: 2023-11-09 — End: 2023-11-09
  Administered 2023-11-09: 15 mL via OROMUCOSAL

## 2023-11-09 MED ORDER — STERILE WATER FOR IRRIGATION IR SOLN
Status: DC | PRN
  Administered 2023-11-09: 1000 mL

## 2023-11-09 MED ORDER — LIDOCAINE HCL (PF) 1 % IJ SOLN
INTRAMUSCULAR | Status: DC | PRN
  Administered 2023-11-09: 10 mL

## 2023-11-09 MED ORDER — PROPOFOL 10 MG/ML IV BOLUS
INTRAVENOUS | Status: DC | PRN
  Administered 2023-11-09: 40 mg via INTRAVENOUS

## 2023-11-09 MED ORDER — OXYCODONE HCL 5 MG PO TABS
5.0000 mg | ORAL_TABLET | Freq: Once | ORAL | Status: AC | PRN
  Administered 2023-11-09: 5 mg via ORAL
  Filled 2023-11-09: qty 1

## 2023-11-09 MED ORDER — LACTATED RINGERS IV SOLN: INTRAVENOUS | Status: DC

## 2023-11-09 MED ORDER — LIDOCAINE HCL (PF) 1 % IJ SOLN
INTRAMUSCULAR | Status: AC
  Filled 2023-11-09: qty 30

## 2023-11-09 MED ORDER — FENTANYL CITRATE PF 50 MCG/ML IJ SOSY: 25.0000 ug | PREFILLED_SYRINGE | INTRAMUSCULAR | Status: DC | PRN

## 2023-11-09 MED ORDER — ONDANSETRON HCL 4 MG/2ML IJ SOLN
INTRAMUSCULAR | Status: AC
  Filled 2023-11-09: qty 2

## 2023-11-09 MED ORDER — ORAL CARE MOUTH RINSE: 15.0000 mL | Freq: Once | OROMUCOSAL | Status: AC

## 2023-11-09 MED ORDER — FENTANYL CITRATE (PF) 100 MCG/2ML IJ SOLN
INTRAMUSCULAR | Status: AC
  Filled 2023-11-09: qty 2

## 2023-11-09 MED ORDER — SODIUM CHLORIDE 0.9 % IV SOLN: 12.5000 mg | INTRAVENOUS | Status: DC | PRN

## 2023-11-09 MED ORDER — FENTANYL CITRATE (PF) 100 MCG/2ML IJ SOLN
INTRAMUSCULAR | Status: DC | PRN
  Administered 2023-11-09 (×2): 50 ug via INTRAVENOUS

## 2023-11-09 MED ORDER — PROPOFOL 500 MG/50ML IV EMUL
INTRAVENOUS | Status: DC | PRN
  Administered 2023-11-09: 75 ug/kg/min via INTRAVENOUS

## 2023-11-09 MED ORDER — FLUCONAZOLE 100 MG PO TABS: 100.0000 mg | ORAL_TABLET | Freq: Every day | ORAL | 0 refills | Status: DC

## 2023-11-09 MED ORDER — ONDANSETRON HCL 4 MG/2ML IJ SOLN
INTRAMUSCULAR | Status: DC | PRN
  Administered 2023-11-09: 4 mg via INTRAVENOUS

## 2023-11-09 MED ORDER — MIDAZOLAM HCL 5 MG/5ML IJ SOLN
INTRAMUSCULAR | Status: DC | PRN
  Administered 2023-11-09: 2 mg via INTRAVENOUS

## 2023-11-09 MED ORDER — LIDOCAINE HCL (CARDIAC) PF 100 MG/5ML IV SOSY
PREFILLED_SYRINGE | INTRAVENOUS | Status: DC | PRN
  Administered 2023-11-09: 80 mg via INTRATRACHEAL

## 2023-11-09 MED ORDER — DEXAMETHASONE SODIUM PHOSPHATE 4 MG/ML IJ SOLN
INTRAMUSCULAR | Status: DC | PRN
  Administered 2023-11-09: 5 mg via INTRAVENOUS

## 2023-11-09 MED ORDER — TRAMADOL HCL 50 MG PO TABS: 50.0000 mg | ORAL_TABLET | Freq: Four times a day (QID) | ORAL | 0 refills | Status: DC | PRN

## 2023-11-09 SURGICAL SUPPLY — 26 items
COVER LIGHT HANDLE STERIS (MISCELLANEOUS) ×2 IMPLANT
COVER PROBE W GEL 5X96 (DRAPES) ×1 IMPLANT
DECANTER SPIKE VIAL GLASS SM (MISCELLANEOUS) ×1 IMPLANT
DERMABOND ADVANCED .7 DNX12 (GAUZE/BANDAGES/DRESSINGS) ×1 IMPLANT
ELECT REM PT RETURN 9FT ADLT (ELECTROSURGICAL) ×1
ELECTRODE REM PT RTRN 9FT ADLT (ELECTROSURGICAL) IMPLANT
GAUZE 4X4 16PLY ~~LOC~~+RFID DBL (SPONGE) ×1 IMPLANT
GAUZE SPONGE 4X4 12PLY STRL (GAUZE/BANDAGES/DRESSINGS) ×1 IMPLANT
GLOVE BIO SURGEON STRL SZ8 (GLOVE) ×1 IMPLANT
GLOVE BIOGEL PI IND STRL 7.0 (GLOVE) ×2 IMPLANT
GOWN STRL REUS W/TWL LRG LVL3 (GOWN DISPOSABLE) ×1 IMPLANT
GOWN STRL REUS W/TWL XL LVL3 (GOWN DISPOSABLE) ×1 IMPLANT
KIT HANDSET INTERSTIM COMM (NEUROSURGERY SUPPLIES) ×1 IMPLANT
KIT TURNOVER CYSTO (KITS) ×1 IMPLANT
NDL HYPO 21X1.5 SAFETY (NEEDLE) IMPLANT
NEEDLE HYPO 21X1.5 SAFETY (NEEDLE) ×1 IMPLANT
NEUROSTIM NON-RECHARGE (Neurostimulator) IMPLANT
NEUROSTIMULATOR NON-RECHARGE (Neurostimulator) ×1 IMPLANT
PACK MINOR (CUSTOM PROCEDURE TRAY) IMPLANT
PAD ARMBOARD 7.5X6 YLW CONV (MISCELLANEOUS) IMPLANT
POSITIONER HEAD 8X9X4 ADT (SOFTGOODS) ×1 IMPLANT
SET BASIN LINEN APH (SET/KITS/TRAYS/PACK) ×1 IMPLANT
SUT MNCRL AB 4-0 PS2 18 (SUTURE) ×1 IMPLANT
SUT VIC AB 3-0 SH 27X BRD (SUTURE) ×1 IMPLANT
SYR 10ML LL (SYRINGE) IMPLANT
WATER STERILE IRR 1000ML POUR (IV SOLUTION) ×1 IMPLANT

## 2023-11-09 NOTE — Op Note (Signed)
 Preoperative diagnosis: overactive  Postoperative diagnosis: Same  Procedure: Placement of Axonics stage 2 and impedance check  Surgeon: Dr. Belvie Clara  Assistant: None  Antibiotics: Ancef   Drains: None  Indications: The patient is a 48yo with a hx of overactive bladder. She has Stage 1 Axonics 2 weeks ago and has been able to void normally since placement. After discussing treatment options she has elected to proceed with Interstim Stage 2 axonics.  Procedure in detail: Prior to the procedure consent was obtained. The patient was brought to the operating room and a brief time out was completed to ensure correct patient, correct procedure and correct site. Preoperative antibiotics were given. Extra care was taken positioning the patient in a prone position. Usual skin preparation was utilized. The previous incision in the left upper buttock was opend and previous vicryl suture removed. We then brought the lead connector into the operative field. We then removed the screw holding the temporary lead. We then unscrewed the lead and removed it. We then irrigated the pocket with GU irrigation followed by sterile water . We then attached the generator and battery to the lead and tightened the screws. We then placed the generator in the previous pocket int he left upper buttock. We closed the deep subcutaneous tissue with 2-0 vicryl in a running fashion. The incision was then closed with running 4-0 monocryl.  Impedance check was done utilizing sterile technique and was normal in all 4 positions We then placed dermabond on the incisions and this concluded the procedure which was well tolerated by the patient.  Complications: none  Condition: stable, transferred to PACU  Plan: The patient is to be discharged home after she voids in the PACU. She is to followup in 4 weeks for wound check

## 2023-11-09 NOTE — Progress Notes (Signed)
 Pt had dermabond on her left buttocks incision.  No redness on skin

## 2023-11-09 NOTE — Anesthesia Postprocedure Evaluation (Signed)
 Anesthesia Post Note  Patient: Denise Avila  Procedure(s) Performed: RENNA IMPLANT SECOND STAGE (Buttocks)  Patient location during evaluation: PACU Anesthesia Type: General Level of consciousness: awake and alert Pain management: pain level controlled Vital Signs Assessment: post-procedure vital signs reviewed and stable Respiratory status: spontaneous breathing, nonlabored ventilation, respiratory function stable and patient connected to nasal cannula oxygen Cardiovascular status: blood pressure returned to baseline and stable Postop Assessment: no apparent nausea or vomiting Anesthetic complications: no   There were no known notable events for this encounter.   Last Vitals:  Vitals:   11/09/23 0845 11/09/23 0900  BP: 98/70 113/76  Pulse: 76 76  Resp: 13 15  Temp:  36.5 C  SpO2: 99% 100%    Last Pain:  Vitals:   11/09/23 0900  TempSrc: Oral  PainSc: 3                  Eljay Lave L Vernell Townley

## 2023-11-09 NOTE — Transfer of Care (Signed)
 Immediate Anesthesia Transfer of Care Note  Patient: Denise Avila  Procedure(s) Performed: RENNA IMPLANT SECOND STAGE (Buttocks)  Patient Location: PACU  Anesthesia Type:MAC  Level of Consciousness: awake and alert   Airway & Oxygen Therapy: Patient Spontanous Breathing and Patient connected to nasal cannula oxygen  Post-op Assessment: Report given to RN and Post -op Vital signs reviewed and stable  Post vital signs: Reviewed and stable  Last Vitals:  Vitals Value Taken Time  BP 97/63 11/09/23 0823  Temp    Pulse 83 11/09/23 0824  Resp 14 11/09/23 0824  SpO2 97 % 11/09/23 0824  Vitals shown include unfiled device data.  Last Pain:  Vitals:   11/09/23 0626  TempSrc: Oral  PainSc: 3       Patients Stated Pain Goal: 6 (11/09/23 9373)  Complications: No notable events documented.

## 2023-11-09 NOTE — Interval H&P Note (Signed)
 History and Physical Interval Note:  11/09/2023 7:29 AM  Denise Avila  has presented today for surgery, with the diagnosis of OAB with urinary frequency.  The various methods of treatment have been discussed with the patient and family. After consideration of risks, benefits and other options for treatment, the patient has consented to  Procedure(s): INTERSTIM IMPLANT SECOND STAGE (N/A) as a surgical intervention.  The patient's history has been reviewed, patient examined, no change in status, stable for surgery.  I have reviewed the patient's chart and labs.  Questions were answered to the patient's satisfaction.     Denise Avila

## 2023-11-10 ENCOUNTER — Encounter (HOSPITAL_COMMUNITY): Payer: Self-pay | Admitting: Urology

## 2023-11-10 NOTE — Telephone Encounter (Signed)
 Waiting for MD to confirm RX

## 2023-11-13 ENCOUNTER — Telehealth: Payer: Self-pay

## 2023-11-13 ENCOUNTER — Other Ambulatory Visit: Payer: Self-pay

## 2023-11-13 DIAGNOSIS — B379 Candidiasis, unspecified: Secondary | ICD-10-CM

## 2023-11-13 MED ORDER — FLUCONAZOLE 100 MG PO TABS: 100.0000 mg | ORAL_TABLET | Freq: Every day | ORAL | 0 refills | Status: AC

## 2023-11-13 NOTE — Telephone Encounter (Signed)
 Called Pt to let her know her Rx was adjusted correctly and sent in verified pharmacy

## 2023-11-17 DIAGNOSIS — Z960 Presence of urogenital implants: Secondary | ICD-10-CM | POA: Insufficient documentation

## 2023-11-17 NOTE — Progress Notes (Deleted)
 Name: Denise Avila DOB: [birth year] MRN: [id number]  Diagnoses: Post-operative state  HPI: Denise Avila presents post-operatively. ***She is accompanied by ***. GU History: 1. OAB with urinary frequency, urgency, and urge incontinence. - Not a safe candidate for anticholinergics due to pre-existing dry mouth & dry eyes along with risk for exacerbating her multiple sclerosus.  - Previously took Myrbetriq 50 mg daily (samples) but that was cost prohibitive. - Taking Gemtesa 75 mg daily. 2. Kidney stones.  - Has had prior ESWL, URS, and PCNL procedures.  - 11/03/2022: Stone analysis showed 100% calcium oxalate composition. - 02/28/2023: [facility]olink 24 hour urinalysis showed hypocitraturia, hyperoxaluria, hyperuricosuria. - Taking sodium bicarb and Allopurinol for stone prevention. Could not tolerate UrocitK due to hyperkalemia.  - Taking Flomax 0.4 mg daily. 3. Prior recurrent UTIs (resolved).  Recent history: > 10/26/2023: Underwent Axonics Stage 1 procedure by Dr. Ronne Binning.  > 10/31/2023: Postop visit. OAB symptoms significant improved since procedure.   Today: She presents s/p Axonics Stage 2 procedure by Dr. Ronne Binning on 11/09/2023.  She {Actions; denies-reports:120008} redness, warmth, tenderness, swelling, or drainage at the incision site(s). She {Actions; denies-reports:120008} fevers.  She {Actions; denies-reports:120008} continued significant symptomatic improvement since starting sacral neuromodulation.   She reports {Blank multiple:19197::"improved","persistent / unchanged"} urinary ***frequency, ***nocturia, ***urgency, and ***urge incontinence. Voiding ***x/day and ***x/night on average. Leaking ***x/day on average; using *** ***pads / ***diapers per day on average.  She {Actions; denies-reports:120008} dysuria, gross hematuria, straining to void, or sensations of incomplete emptying.   Fall Screening: Do you usually have a device to assist in your mobility?  {yes/no:20286} ***cane / ***walker / ***wheelchair   Medications: Current Outpatient Medications  Medication Sig Dispense Refill   acetaminophen (TYLENOL) 650 MG CR tablet Take 1,300 mg by mouth in the morning, at noon, and at bedtime.     allopurinol (ZYLOPRIM) 300 MG tablet Take 1 tablet (300 mg total) by mouth at bedtime. 90 tablet 3   baclofen (LIORESAL) 10 MG tablet Take 20 mg by mouth daily.     cetirizine (ZYRTEC) 10 MG tablet Take 10 mg by mouth daily.     citalopram (CELEXA) 20 MG tablet Take 20 mg by mouth daily.     clonazePAM (KLONOPIN) 0.5 MG tablet Take 1.5 mg by mouth at bedtime.  0   diphenhydrAMINE (BENADRYL) 25 MG tablet Take 25 mg by mouth every 6 (six) hours as needed (take with for migraines).     doxycycline (VIBRAMYCIN) 100 MG capsule Take 1 capsule (100 mg total) by mouth every 12 (twelve) hours. 14 capsule 0   DULoxetine (CYMBALTA) 60 MG capsule Take 120 mg by mouth 2 (two) times daily.     ezetimibe (ZETIA) 10 MG tablet Take 10 mg by mouth in the morning.     famotidine (PEPCID) 40 MG tablet Take 40 mg by mouth 2 (two) times daily.     fluconazole (DIFLUCAN) 100 MG tablet Take 1 tablet (100 mg total) by mouth daily for 7 days. 7 tablet 0   fluconazole (DIFLUCAN) 150 MG tablet Take once. May repeat in 3 days if symptoms fail to fully resolve after first dose. 2 tablet 0   gabapentin (NEURONTIN) 800 MG tablet Take 2 tablets (1,600 mg total) by mouth 2 (two) times daily. 30 tablet 8   lamoTRIgine (LAMICTAL) 150 MG tablet Take 150 mg by mouth 2 (two) times daily.     MAGNESIUM PO Take 1 tablet by mouth daily.     meclizine (ANTIVERT) 12.5 MG tablet Take  12.5 mg by mouth 3 (three) times daily as needed for dizziness.     Multiple Vitamins-Minerals (BARIATRIC MULTIVITAMINS) CAPS Take 1 capsule by mouth daily.     naratriptan (AMERGE) 2.5 MG tablet Take 2.5 mg by mouth as needed for migraine.     omeprazole (PRILOSEC) 40 MG capsule Take 40 mg by mouth 2 (two) times daily.      phenazopyridine (PYRIDIUM) 200 MG tablet Take 200 mg by mouth 3 (three) times daily as needed for pain.     phentermine 15 MG capsule Take 15 mg by mouth daily.     Polyethyl Glycol-Propyl Glycol (SYSTANE) 0.4-0.3 % GEL ophthalmic gel Place 1 Application into both eyes 4 (four) times daily as needed (dry eyes).     prazosin (MINIPRESS) 1 MG capsule Take 1 mg by mouth at bedtime.     pregabalin (LYRICA) 25 MG capsule Take 100 mg by mouth 3 (three) times daily.     prochlorperazine (COMPAZINE) 10 MG tablet Take 10 mg by mouth every 6 (six) hours as needed (migraines (nausea)).     promethazine (PHENERGAN) 25 MG tablet Take 1 tablet (25 mg total) by mouth 4 (four) times daily as needed for nausea or vomiting. 30 tablet 0   QUEtiapine (SEROQUEL) 50 MG tablet Take 50 mg by mouth at bedtime.     RABEprazole (ACIPHEX) 20 MG tablet Take 1 tablet by mouth 2 (two) times daily.     rosuvastatin (CRESTOR) 5 MG tablet Take 5 mg by mouth at bedtime.     Simethicone (GAS RELIEF) 250 MG CAPS Take 250 mg by mouth 3 (three) times daily as needed (gas/bloating).     sodium bicarbonate 650 MG tablet Take 1 tablet (650 mg total) by mouth 2 (two) times daily. 60 tablet 11   tamsulosin (FLOMAX) 0.4 MG CAPS capsule TAKE 1 CAPSULE EVERY DAY AFTER SUPPER 90 capsule 3   tiZANidine (ZANAFLEX) 4 MG tablet Take 4 mg by mouth daily.     traMADol (ULTRAM) 50 MG tablet Take 1 tablet (50 mg total) by mouth every 6 (six) hours as needed. 30 tablet 0   Ubrogepant (UBRELVY) 100 MG TABS Take 100 mg by mouth daily as needed (migraine).     Vibegron (GEMTESA) 75 MG TABS Take 1 tablet (75 mg total) by mouth daily. 30 tablet 11   No current facility-administered medications for this visit.    Allergies: Allergies  Allergen Reactions   Penicillins Swelling and Rash    Rash all over [place] and lip swelling. Has patient had a PCN reaction causing immediate rash, facial/tongue/throat swelling, SOB or lightheadedness with  hypotension: Yes Has patient had a PCN reaction causing severe rash involving mucus membranes or skin necrosis: Yes Has patient had a PCN reaction that required hospitalization: No If all of the above answers are "NO", then may proceed with Cephalosporin use.   Cyanoacrylate Dermatitis    Dermabond     Nortriptyline Other (See Comments)    Severe chest pain     Nsaids Other (See Comments)    Advised not to take due to gastric sleeve    Amoxicillin Rash    Rash all over [place]    Bactrim [Sulfamethoxazole-Trimethoprim] Rash   Ciprofloxacin Diarrhea and Nausea Only   Tape Rash    Past Medical History:  Diagnosis Date   Abnormal Pap smear of cervix 12/2014   Acid reflux    Adrenal insufficiency (HCC)    Anxiety    Arthritis  Arthritis    Depression    Depression    Endometriosis    Fibroids    age 88   Fibromyalgia    High cholesterol    History of kidney stones    Liver disease    per pt report, will have biopsy Nov 08, 2017   Migraines    Pleurisy 02/2015   while hospitalized    Seizure (HCC)    on meds and no seizures in 2-3 years. Unknown etiology   Skin cancer (melanoma) (HCC)    Syncope and collapse    Uterine cancer (HCC)    Past Surgical History:  Procedure Laterality Date   ABDOMINAL HYSTERECTOMY     ABLATION     uterine   COLECTOMY     CYSTOSCOPY W/ URETERAL STENT PLACEMENT Left 02/02/2015   Procedure: CYSTOSCOPY WITH RETROGRADE PYELOGRAM/URETERAL STENT PLACEMENT;  Surgeon: Jerilee Field, MD;  Location: [facility];  Service: Urology;  Laterality: Left;   CYSTOSCOPY WITH RETROGRADE PYELOGRAM, URETEROSCOPY AND STENT PLACEMENT Bilateral 01/20/2022   Procedure: CYSTOSCOPY WITH RETROGRADE PYELOGRAM, URETEROSCOPY AND STENT PLACEMENT;  Surgeon: Malen Gauze, MD;  Location: [facility];  Service: Urology;  Laterality: Bilateral;   CYSTOSCOPY WITH RETROGRADE PYELOGRAM, URETEROSCOPY AND STENT PLACEMENT Left 11/03/2022   Procedure: CYSTOSCOPY WITH RETROGRADE  PYELOGRAM, URETEROSCOPY AND STENT PLACEMENT;  Surgeon: Malen Gauze, MD;  Location: [facility];  Service: Urology;  Laterality: Left;  pt knows to arrive at 9:00   CYSTOSCOPY WITH URETEROSCOPY AND STENT PLACEMENT Left 02/13/2015   Procedure: LEFT URETEROSCOPY WITH HOLMIUM LASER AND STENT PLACEMENT;  Surgeon: Jerilee Field, MD;  Location: [facility];  Service: Urology;  Laterality: Left;   DILATION AND CURETTAGE OF UTERUS     ESOPHAGOGASTRODUODENOSCOPY ENDOSCOPY  10/11/2017   @ [facility]; BIOPSY OF LOWER INTESTINE   EXTRACORPOREAL SHOCK WAVE LITHOTRIPSY Left 09/13/2022   Procedure: EXTRACORPOREAL SHOCK WAVE LITHOTRIPSY (ESWL);  Surgeon: Malen Gauze, MD;  Location: [facility];  Service: Urology;  Laterality: Left;   HOLMIUM LASER APPLICATION N/A 02/13/2015   Procedure: HOLMIUM LASER APPLICATION;  Surgeon: Jerilee Field, MD;  Location: [facility];  Service: Urology;  Laterality: N/A;   INTERSTIM IMPLANT PLACEMENT N/A 10/26/2023   Procedure: Leane Platt IMPLANT FIRST STAGE;  Surgeon: Malen Gauze, MD;  Location: [facility];  Service: Urology;  Laterality: N/A;   INTERSTIM IMPLANT PLACEMENT N/A 11/09/2023   Procedure: Leane Platt IMPLANT SECOND STAGE;  Surgeon: Malen Gauze, MD;  Location: [facility];  Service: Urology;  Laterality: N/A;   IR NEPHROSTOMY PLACEMENT RIGHT  07/05/2021   KNEE SURGERY Bilateral 2019   realignment of knees   LAPAROSCOPIC GASTRIC SLEEVE RESECTION     LAPAROSCOPY     x 4   MELANOMA EXCISION  2000   melanoma removed from face, basal cell removed from nose   NEPHROLITHOTOMY Right 07/08/2021   Procedure: NEPHROLITHOTOMY PERCUTANEOUS- with stent placement;  Surgeon: Malen Gauze, MD;  Location: [facility];  Service: Urology;  Laterality: Right;   REPAIR VAGINAL CUFF N/A 08/21/2015   Procedure: REPAIR VAGINAL CUFF, EXAM UNDER ANESTHESIA;  Surgeon: Adolphus Birchwood, MD;  Location: [facility];  Service: Gynecology;  Laterality: N/A;   ROBOTIC ASSISTED TOTAL HYSTERECTOMY WITH BILATERAL  SALPINGO OOPHERECTOMY Bilateral 05/21/2015   Procedure: XI ROBOTIC ASSISTED TOTAL HYSTERECTOMY WITH BILATERAL SALPINGO OOPHORECTOMY;  Surgeon: Adolphus Birchwood, MD;  Location: [facility];  Service: Gynecology;  Laterality: Bilateral;   STONE EXTRACTION WITH BASKET Bilateral 01/20/2022   Procedure: STONE EXTRACTION WITH BASKET;  Surgeon: Ronne Binning,  Mardene Celeste, MD;  Location: [facility];  Service: Urology;  Laterality: Bilateral;   STONE EXTRACTION WITH BASKET Left 11/03/2022   Procedure: STONE EXTRACTION WITH BASKET;  Surgeon: Malen Gauze, MD;  Location: [facility];  Service: Urology;  Laterality: Left;   Family History  Problem Relation Age of Onset   Hypertension Mother    Melanoma Mother    Breast cancer Mother 54   Cancer - Other Father        liver   Diabetes Mellitus II Maternal Grandmother    Heart disease Maternal Grandmother    Breast cancer Maternal Grandmother    Cancer - Lung Maternal Grandfather    Osteoporosis Paternal Grandmother    Melanoma Paternal Grandfather    Social History   Socioeconomic History   Marital status: Married    Spouse name: Not on file   Number of children: 1   Years of education: Not on file   Highest education level: Associate degree: occupational, Scientist, product/process development, or vocational program  Occupational History   Not on file  Tobacco Use   Smoking status: Never   Smokeless tobacco: Never  Vaping Use   Vaping status: Never Used  Substance and Sexual Activity   Alcohol use: No   Drug use: No   Sexual activity: Yes  Other Topics Concern   Not on file  Social History Narrative   Lives at home with family. Currently living with mother to help take care of her    Right handed   No Caffeine   Social Drivers of Corporate investment banker Strain: Low Risk  (07/24/2023)   Received [facility]   Overall Financial Resource Strain (CARDIA)    Difficulty of Paying Living Expenses: Not hard at all  Food Insecurity: No Food Insecurity  (07/24/2023)   Received from [facility]   Hunger Vital Sign    Worried About Running Out of Food in the Last Year: Never true    Ran Out of Food in the Last Year: Never true  Transportation Needs: No Transportation Needs (07/24/2023)   Received [facility]    In the past 12 months, has lack of transportation kept you from medical appointments or from getting medications?: No    Lack of Transportation (Non-Medical): No  Physical Activity: Not on file  Stress: Not on file  Social Connections: Unknown (02/01/2022)   Received from [facility]   Social Network    Social Network: Not on file  Intimate Partner Violence: Unknown (01/05/2022)   Received from [facility]   HITS    Physically Hurt: Not on file    Insult or Talk Down To: Not on file    Threaten Physical Harm: Not on file    Scream or Curse: Not on file    SUBJECTIVE  Review of Systems Constitutional: Patient denies any unintentional weight loss or change in strength lntegumentary: Patient denies any rashes or pruritus Cardiovascular: Patient denies chest pain or syncope Respiratory: Patient denies shortness of breath Gastrointestinal: Patient ***denies nausea, vomiting, constipation, or diarrhea Musculoskeletal: Patient denies muscle cramps or weakness Neurologic: Patient denies convulsions or seizures Allergic/Immunologic: Patient denies recent allergic reaction(s) Hematologic/Lymphatic: Patient denies bleeding tendencies Endocrine: Patient denies heat/cold intolerance  GU: As per HPI.  OBJECTIVE There were no vitals filed for this visit. There is no height or weight on file to calculate BMI.  Physical Examination Constitutional: No  obvious distress; patient is non-toxic appearing  Cardiovascular: No visible lower extremity edema.  Respiratory: The patient does not have audible wheezing/stridor; respirations do not  appear labored  Gastrointestinal: Abdomen non-distended Musculoskeletal: Normal ROM of UEs  Skin: *** Incision well approximated and intact, dry, and without erythema, warmth, or drainage.  Neurologic: CN 2-12 grossly intact Psychiatric: Answered questions appropriately with normal affect  Hematologic/Lymphatic/Immunologic: No obvious bruises or sites of spontaneous bleeding  UA:  ***positive for *** leukocytes, *** blood, ***nitrites ***Urine microscopy:  ***negative  *** WBC/hpf, *** RBC/hpf, *** bacteria ***with no evidence of UTI ***with no evidence of microscopic hematuria ***otherwise unremarkable ***glucosuria (secondary to ***Jardiance ***Farxiga use)  PVR: *** ml  ASSESSMENT: No diagnosis found. We reviewed the operative procedures and findings. Pre-operative symptoms are ***>50% improved ***unchanged since the procedure.  ***s/p Stage 1: ***Patient elects to ***proceed with ***lnterStim ***Axonics Stage 2 procedure as scheduled on *** with Dr. Marland Kitchen  ***s/p Stage 2: Sutures removed; patient tolerated ***well. ***Remote instructions given.  We agreed to plan for follow up in *** months or sooner if needed. Patient verbalized understanding of and agreement with current plan. All questions were answered.  PLAN: ***s/p Stage 1: Proceed with ***lnterStim ***Axonics Stage 2 procedure as scheduled on *** with Dr. Marland Kitchen  ***s/p Stage 2: Sutures removed today. ***Remote instructions given. ***No follow-ups on file.  No orders of the defined types were placed in this encounter.   It has been explained that the patient is to follow regularly with their PCP in addition to all other providers involved in their care and to follow instructions provided by these respective offices. Patient advised to contact urology clinic if any urologic-pertaining questions, concerns, new symptoms or problems arise in the interim period.  There are no Patient Instructions on file for this  visit.  Electronically signed by:  Donnita Falls, MSN, FNP-C, CUNP 11/17/2023 3:10 PM

## 2023-11-20 ENCOUNTER — Ambulatory Visit: Payer: Medicare HMO | Admitting: Podiatry

## 2023-11-21 ENCOUNTER — Ambulatory Visit: Payer: Medicare HMO | Admitting: Podiatry

## 2023-11-22 ENCOUNTER — Ambulatory Visit: Payer: Medicare HMO | Admitting: Urology

## 2023-11-22 DIAGNOSIS — Z09 Encounter for follow-up examination after completed treatment for conditions other than malignant neoplasm: Secondary | ICD-10-CM

## 2023-11-22 DIAGNOSIS — N3281 Overactive bladder: Secondary | ICD-10-CM

## 2023-11-22 DIAGNOSIS — N3941 Urge incontinence: Secondary | ICD-10-CM

## 2023-11-22 DIAGNOSIS — Z960 Presence of urogenital implants: Secondary | ICD-10-CM

## 2023-11-24 ENCOUNTER — Ambulatory Visit: Payer: Medicare HMO | Admitting: Urology

## 2023-11-27 ENCOUNTER — Ambulatory Visit: Payer: Medicare HMO | Admitting: Urology

## 2023-12-06 ENCOUNTER — Ambulatory Visit: Payer: Medicare HMO | Admitting: Urology

## 2023-12-06 VITALS — BP 113/71 | HR 121

## 2023-12-06 DIAGNOSIS — N200 Calculus of kidney: Secondary | ICD-10-CM

## 2023-12-06 DIAGNOSIS — Z5189 Encounter for other specified aftercare: Secondary | ICD-10-CM

## 2023-12-06 LAB — URINALYSIS, ROUTINE W REFLEX MICROSCOPIC
Bilirubin, UA: NEGATIVE
Glucose, UA: NEGATIVE
Ketones, UA: NEGATIVE
Nitrite, UA: NEGATIVE
Protein,UA: NEGATIVE
RBC, UA: NEGATIVE
Specific Gravity, UA: >1.03 — ABNORMAL HIGH (ref 1.005–1.030)
Urobilinogen, Ur: 0.2 mg/dL (ref 0.2–1.0)
pH, UA: 5.5 (ref 5.0–7.5)

## 2023-12-06 LAB — MICROSCOPIC EXAMINATION

## 2023-12-06 NOTE — Progress Notes (Signed)
 12/06/2023 10:09 AM   Denise Avila [birth year] [id number]  Referring provider: Milus Height, PA [address] [place],  [place] [address]  Followuop after interstim   HPI: [name] is a 48yo here for followup after interstim placement. NO pain or drainage from the incision. Her urinary frequency and urgency is improved. No other complaints today   PMH: Past Medical History:  Diagnosis Date   Abnormal Pap smear of cervix 12/2014   Acid reflux    Adrenal insufficiency (HCC)    Anxiety    Arthritis    Arthritis    Depression    Depression    Endometriosis    Fibroids    age 82   Fibromyalgia    High cholesterol    History of kidney stones    Liver disease    per pt report, will have biopsy Nov 08, 2017   Migraines    Pleurisy 02/2015   while hospitalized    Seizure (HCC)    on meds and no seizures in 2-3 years. Unknown etiology   Skin cancer (melanoma) (HCC)    Syncope and collapse    Uterine cancer [facility])     Surgical History: Past Surgical History:  Procedure Laterality Date   ABDOMINAL HYSTERECTOMY     ABLATION     uterine   COLECTOMY     CYSTOSCOPY W/ URETERAL STENT PLACEMENT Left 02/02/2015   Procedure: CYSTOSCOPY WITH RETROGRADE PYELOGRAM/URETERAL STENT PLACEMENT;  Surgeon: Denise Field, MD;  Location: [facility];  Service: Urology;  Laterality: Left;   CYSTOSCOPY WITH RETROGRADE PYELOGRAM, URETEROSCOPY AND STENT PLACEMENT Bilateral 01/20/2022   Procedure: CYSTOSCOPY WITH RETROGRADE PYELOGRAM, URETEROSCOPY AND STENT PLACEMENT;  Surgeon: Denise Gauze, MD;  Location: [facility];  Service: Urology;  Laterality: Bilateral;   CYSTOSCOPY WITH RETROGRADE PYELOGRAM, URETEROSCOPY AND STENT PLACEMENT Left 11/03/2022   Procedure: CYSTOSCOPY WITH RETROGRADE PYELOGRAM, URETEROSCOPY AND STENT PLACEMENT;  Surgeon: Denise Gauze, MD;  Location: [facility];  Service: Urology;  Laterality: Left;  pt knows to arrive at 9:00   CYSTOSCOPY WITH  URETEROSCOPY AND STENT PLACEMENT Left 02/13/2015   Procedure: LEFT URETEROSCOPY WITH HOLMIUM LASER AND STENT PLACEMENT;  Surgeon: Denise Field, MD;  Location: [facility];  Service: Urology;  Laterality: Left;   DILATION AND CURETTAGE OF UTERUS     ESOPHAGOGASTRODUODENOSCOPY ENDOSCOPY  10/11/2017   @ [facility]; BIOPSY OF LOWER INTESTINE   EXTRACORPOREAL SHOCK WAVE LITHOTRIPSY Left 09/13/2022   Procedure: EXTRACORPOREAL SHOCK WAVE LITHOTRIPSY (ESWL);  Surgeon: Denise Gauze, MD;  Location: [facility];  Service: Urology;  Laterality: Left;   HOLMIUM LASER APPLICATION N/A 02/13/2015   Procedure: HOLMIUM LASER APPLICATION;  Surgeon: Denise Field, MD;  [facility]: [facility];  Service: Urology;  Laterality: N/A;   INTERSTIM IMPLANT PLACEMENT N/A 10/26/2023   Procedure: Leane Platt IMPLANT FIRST STAGE;  Surgeon: Denise Gauze, MD;  Location: [facility];  Service: Urology;  Laterality: N/A;   INTERSTIM IMPLANT PLACEMENT N/A 11/09/2023   Procedure: Leane Platt IMPLANT SECOND STAGE;  Surgeon: Denise Gauze, MD;  Location: [facility];  Service: Urology;  Laterality: N/A;   IR NEPHROSTOMY PLACEMENT RIGHT  07/05/2021   KNEE SURGERY Bilateral 2019   realignment of knees   LAPAROSCOPIC GASTRIC SLEEVE RESECTION     LAPAROSCOPY     x 4   MELANOMA EXCISION  2000   melanoma removed from face, basal cell removed from nose   NEPHROLITHOTOMY Right 07/08/2021   Procedure: NEPHROLITHOTOMY PERCUTANEOUS- with stent placement;  Surgeon:  Denise Avila, Denise Celeste, MD;  Location: [facility];  Service: Urology;  Laterality: Right;   REPAIR VAGINAL CUFF N/A 08/21/2015   Procedure: REPAIR VAGINAL CUFF, EXAM UNDER ANESTHESIA;  Surgeon: Denise Birchwood, MD;  Location: [facility];  Service: Gynecology;  Laterality: N/A;   ROBOTIC ASSISTED TOTAL HYSTERECTOMY WITH BILATERAL SALPINGO OOPHERECTOMY Bilateral 05/21/2015   Procedure: XI ROBOTIC ASSISTED TOTAL HYSTERECTOMY WITH BILATERAL SALPINGO OOPHORECTOMY;  Surgeon: Denise Birchwood, MD;  Location: [facility];   Service: Gynecology;  Laterality: Bilateral;   STONE EXTRACTION WITH BASKET Bilateral 01/20/2022   Procedure: STONE EXTRACTION WITH BASKET;  Surgeon: Denise Gauze, MD;  [facility];  Service: Urology;  Laterality: Bilateral;   STONE EXTRACTION WITH BASKET Left 11/03/2022   Procedure: STONE EXTRACTION WITH BASKET;  Surgeon: Denise Gauze, MD;  Location: [facility];  Service: Urology;  Laterality: Left;    Home Medications:  Allergies as of 12/06/2023       Reactions   Penicillins Swelling, Rash   Rash all over [place] and lip swelling. Has patient had a PCN reaction causing immediate rash, facial/tongue/throat swelling, SOB or lightheadedness with hypotension: Yes Has patient had a PCN reaction causing severe rash involving mucus membranes or skin necrosis: Yes Has patient had a PCN reaction that required hospitalization: No If all of the above answers are "NO", then may proceed with Cephalosporin use.   Cyanoacrylate Dermatitis   Dermabond   Nortriptyline Other (See Comments)   Severe chest pain    Nsaids Other (See Comments)   Advised not to take due to gastric sleeve    Amoxicillin Rash   Rash all over [place]   Bactrim [sulfamethoxazole-trimethoprim] Rash   Ciprofloxacin Diarrhea, Nausea Only   Tape Rash        Medication List        Accurate as of December 06, 2023 10:09 AM. If you have any questions, ask your nurse or doctor.          STOP taking these medications    baclofen 10 MG tablet Commonly known as: LIORESAL   tiZANidine 4 MG tablet Commonly known as: ZANAFLEX       TAKE these medications    acetaminophen 650 MG CR tablet Commonly known as: TYLENOL Take 1,300 mg by mouth in the morning, at noon, and at bedtime.   allopurinol 300 MG tablet Commonly known as: ZYLOPRIM Take 1 tablet (300 mg total) by mouth at bedtime.   Bariatric Multivitamins Caps Take 1 capsule by mouth daily.   cetirizine 10 MG tablet Commonly known as: ZYRTEC Take 10  mg by mouth daily.   citalopram 20 MG tablet Commonly known as: CELEXA Take 20 mg by mouth daily.   clonazePAM 0.5 MG tablet Commonly known as: KLONOPIN Take 1.5 mg by mouth at bedtime.   diphenhydrAMINE 25 MG tablet Commonly known as: BENADRYL Take 25 mg by mouth every 6 (six) hours as needed (take with for migraines).   doxycycline 100 MG capsule Commonly known as: VIBRAMYCIN Take 1 capsule (100 mg total) by mouth every 12 (twelve) hours.   DULoxetine 60 MG capsule Commonly known as: CYMBALTA Take 120 mg by mouth 2 (two) times daily.   ezetimibe 10 MG tablet Commonly known as: ZETIA Take 10 mg by mouth in the morning.   famotidine 40 MG tablet Commonly known as: PEPCID Take 40 mg by mouth 2 (two) times daily.   fluconazole 150 MG tablet Commonly known as: DIFLUCAN Take once. May repeat in 3 days if  symptoms fail to fully resolve after first dose.   gabapentin 800 MG tablet Commonly known as: NEURONTIN Take 2 tablets (1,600 mg total) by mouth 2 (two) times daily.   Gas Relief 250 MG Caps Generic drug: Simethicone Take 250 mg by mouth 3 (three) times daily as needed (gas/bloating).   Gemtesa 75 MG Tabs Generic drug: Vibegron Take 1 tablet (75 mg total) by mouth daily.   lamoTRIgine 150 MG tablet Commonly known as: LAMICTAL Take 150 mg by mouth 2 (two) times daily.   MAGNESIUM PO Take 1 tablet by mouth daily.   meclizine 12.5 MG tablet Commonly known as: ANTIVERT Take 12.5 mg by mouth 3 (three) times daily as needed for dizziness.   naratriptan 2.5 MG tablet Commonly known as: AMERGE Take 2.5 mg by mouth as needed for migraine.   omeprazole 40 MG capsule Commonly known as: PRILOSEC Take 40 mg by mouth 2 (two) times daily.   phenazopyridine 200 MG tablet Commonly known as: PYRIDIUM Take 200 mg by mouth 3 (three) times daily as needed for pain.   phentermine 15 MG capsule Take 15 mg by mouth daily.   Polyethyl Glycol-Propyl Glycol 0.4-0.3 % Gel  ophthalmic gel Commonly known as: SYSTANE Place 1 Application into both eyes 4 (four) times daily as needed (dry eyes).   prazosin 1 MG capsule Commonly known as: MINIPRESS Take 1 mg by mouth at bedtime.   pregabalin 25 MG capsule Commonly known as: LYRICA Take 100 mg by mouth 3 (three) times daily.   prochlorperazine 10 MG tablet Commonly known as: COMPAZINE Take 10 mg by mouth every 6 (six) hours as needed (migraines (nausea)).   promethazine 25 MG tablet Commonly known as: PHENERGAN Take 1 tablet (25 mg total) by mouth 4 (four) times daily as needed for nausea or vomiting.   QUEtiapine 50 MG tablet Commonly known as: SEROQUEL Take 50 mg by mouth at bedtime.   RABEprazole 20 MG tablet Commonly known as: ACIPHEX Take 1 tablet by mouth 2 (two) times daily.   rosuvastatin 5 MG tablet Commonly known as: CRESTOR Take 5 mg by mouth at bedtime.   sodium bicarbonate 650 MG tablet Take 1 tablet (650 mg total) by mouth 2 (two) times daily.   tamsulosin 0.4 MG Caps capsule Commonly known as: FLOMAX TAKE 1 CAPSULE EVERY DAY AFTER SUPPER   traMADol 50 MG tablet Commonly known as: ULTRAM Take 1 tablet (50 mg total) by mouth every 6 (six) hours as needed.   Ubrelvy 100 MG Tabs Generic drug: Ubrogepant Take 100 mg by mouth daily as needed (migraine).        Allergies:  Allergies  Allergen Reactions   Penicillins Swelling and Rash    Rash all over [place] and lip swelling. Has patient had a PCN reaction causing immediate rash, facial/tongue/throat swelling, SOB or lightheadedness with hypotension: Yes Has patient had a PCN reaction causing severe rash involving mucus membranes or skin necrosis: Yes Has patient had a PCN reaction that required hospitalization: No If all of the above answers are "NO", then may proceed with Cephalosporin use.   Cyanoacrylate Dermatitis    Dermabond     Nortriptyline Other (See Comments)    Severe chest pain     Nsaids Other (See  Comments)    Advised not to take due to gastric sleeve    Amoxicillin Rash    Rash all over [place]    Bactrim [Sulfamethoxazole-Trimethoprim] Rash   Ciprofloxacin Diarrhea and Nausea Only   Tape Rash  Family History: Family History  Problem Relation Age of Onset   Hypertension Mother    Melanoma Mother    Breast cancer Mother 73   Cancer - Other Father        liver   Diabetes Mellitus II Maternal Grandmother    Heart disease Maternal Grandmother    Breast cancer Maternal Grandmother    Cancer - Lung Maternal Grandfather    Osteoporosis Paternal Grandmother    Melanoma Paternal Grandfather     Social History:  reports that she has never smoked. She has never used smokeless tobacco. She reports that she does not drink alcohol and does not use drugs.  ROS: All other review of systems were reviewed and are negative except what is noted above in HPI  Physical Exam: BP 113/71   Pulse (!) 121   LMP 05/04/2015 (Approximate)   Constitutional:  Alert and oriented, No acute distress. HEENT: [place] AT, moist mucus membranes.  Trachea midline, no masses. Cardiovascular: No clubbing, cyanosis, or edema. Respiratory: Normal respiratory effort, no increased work of breathing. GI: Abdomen is soft, nontender, nondistended, no abdominal masses GU: No CVA tenderness.  Lymph: No cervical or inguinal lymphadenopathy. Skin: No rashes, bruises or suspicious lesions. Neurologic: Grossly intact, no focal deficits, moving all 4 extremities. Psychiatric: Normal mood and affect.  Laboratory Data: Lab Results  Component Value Date   WBC 11.0 (H) 07/09/2021   HGB 11.6 (L) 07/09/2021   HCT 35.1 (L) 07/09/2021   MCV 94.1 07/09/2021   PLT 167 07/09/2021    Lab Results  Component Value Date   CREATININE 0.67 01/17/2022    No results found for: "PSA"  No results found for: "TESTOSTERONE"  Lab Results  Component Value Date   HGBA1C 5.3 10/13/2021    Urinalysis    Component Value  Date/Time   COLORURINE YELLOW (A) 06/21/2017 1735   APPEARANCEUR Clear 10/31/2023 1121   LABSPEC 1.019 06/21/2017 1735   PHURINE 5.0 06/21/2017 1735   GLUCOSEU Negative 10/31/2023 1121   HGBUR NEGATIVE 06/21/2017 1735   BILIRUBINUR Negative 10/31/2023 1121   KETONESUR NEGATIVE 06/21/2017 1735   PROTEINUR Negative 10/31/2023 1121   PROTEINUR NEGATIVE 06/21/2017 1735   UROBILINOGEN 0.2 01/27/2020 1124   UROBILINOGEN 1.0 05/18/2015 1359   NITRITE Negative 10/31/2023 1121   NITRITE NEGATIVE 06/21/2017 1735   LEUKOCYTESUR Negative 10/31/2023 1121    Lab Results  Component Value Date   LABMICR Comment 10/31/2023   WBCUA 0-5 11/16/2022   LABEPIT 0-10 11/16/2022   MUCUS Present (A) 11/16/2022   BACTERIA Few 11/16/2022    Pertinent Imaging:  Results for orders placed during the hospital encounter of 09/13/23  DG Abd 1 View  Narrative CLINICAL DATA:  Kidney stone, right flank pain.  EXAM: ABDOMEN - 1 VIEW  COMPARISON:  Radiograph 10/14/2022  FINDINGS: No visualized stones projecting over the renal beds or course of the ureters. Stable distribution of pelvic phleboliths. No evidence of bowel obstruction. Enteric sutures in the upper abdomen. Chronic prominent loop of bowel in the right abdomen, unchanged from prior.  IMPRESSION: No visualized urinary tract stones.   Electronically Signed By: Narda Rutherford M.D. On: 09/24/2023 06:55  No results found for this or any previous visit.  No results found for this or any previous visit.  No results found for this or any previous visit.  Results for orders placed during the hospital encounter of 03/13/23  Ultrasound renal complete  Narrative CLINICAL DATA:  Nephrolithiasis follow-up.  EXAM: RENAL / URINARY  TRACT ULTRASOUND COMPLETE  COMPARISON:  None Available.  FINDINGS: Right Kidney:  Renal measurements: 11.3 x 4.5 x 6.3 cm = volume: 169 mL. Echogenicity within normal limits. No mass or  hydronephrosis visualized.  Left Kidney:  Renal measurements: 11.1 x 5.6 x 4.8 cm = volume: 158 mL. Contains a 3.4 mm nonobstructive stone.  Bladder:  Appears normal for degree of bladder distention.  Other:  None.  IMPRESSION: 1. 3.4 mm nonobstructive stone in the left kidney. 2. No other abnormalities.   Electronically Signed By: Gerome Sam III M.D. On: 03/13/2023 13:49  No results found for this or any previous visit.  No results found for this or any previous visit.  Results for orders placed during the hospital encounter of 10/13/21  CT RENAL STONE STUDY  Narrative CLINICAL DATA:  Right-sided flank pain and hematuria for 1 month. Nephrolithiasis.  EXAM: CT ABDOMEN AND PELVIS WITHOUT CONTRAST  TECHNIQUE: Multidetector CT imaging of the abdomen and pelvis was performed following the standard protocol without IV contrast.  RADIATION DOSE REDUCTION: This exam was performed according to the departmental dose-optimization program which includes automated exposure control, adjustment of the mA and/or kV according to patient size and/or use of iterative reconstruction technique.  COMPARISON:  01/01/2020  FINDINGS: Lower chest: No acute findings.  Hepatobiliary: No mass visualized on this unenhanced exam. Gallbladder is unremarkable. No evidence of biliary ductal dilatation.  Pancreas: No mass or inflammatory process visualized on this unenhanced exam.  Spleen:  Within normal limits in size.  Adrenals/Urinary tract: Several tiny less than 5 mm renal calculi are seen bilaterally. No evidence of ureteral calculi or hydronephrosis. Unremarkable unopacified urinary bladder.  Stomach/Bowel: Postop changes again seen from previous subtotal colectomy and sleeve gastrectomy. No evidence of obstruction, inflammatory process, or abnormal fluid collections.  Vascular/Lymphatic: 14 x 11 mm mesenteric soft tissue nodule or lymph node is again seen in the  right abdominal mesentery on image 64/2, stable since prior exam. No other pathologically enlarged lymph nodes identified. No evidence of abdominal aortic aneurysm.  Reproductive: Prior hysterectomy noted. Adnexal regions are unremarkable in appearance.  Other:  None.  Musculoskeletal: No suspicious bone lesions identified. Stable small sclerotic bone lesion in L4 vertebral body compared to previous CT in 2016, consistent with benign etiology.  IMPRESSION: Tiny bilateral renal calculi. No evidence of ureteral calculi, hydronephrosis, or other acute findings.  Stable small right abdominal mesenteric lymph node or soft tissue nodule. Recommend continued follow-up by CT in 6-12 months.   Electronically Signed By: Danae Orleans M.D. On: 10/13/2021 12:38   Assessment & Plan:    1. Visit for wound check (Primary) -followup 3 months with renal US for nephrolithiasis - Urinalysis, Routine w reflex microscopic   No follow-ups on file.  Wilkie Aye, MD  [facility] [place]

## 2023-12-12 ENCOUNTER — Encounter: Payer: Self-pay | Admitting: Urology

## 2023-12-12 NOTE — Patient Instructions (Signed)
 Nerve Test Before Sacral Nerve Stimulator Surgery: What to Expect  Before having a sacral nerve stimulator placed, you'll have a trial test known as a nerve test. This test helps find out if sacral nerve stimulation (SNS) will help your problem. For the test, a temporary wire, called a lead, will be placed in your body. Electric pulses will be sent from a temporary nerve stimulator device that's outside your body. The pulses will move through the lead to an area close to a sacral nerve. The test will take 1-2 weeks to see if this treatment works. You'll keep a journal of your bladder and bowel symptoms before and after the test. If your symptoms improve during the test, you may have surgery to place the permanent sacral nerve stimulator in your body. Tell a health care provider about: Any allergies you have. All medicines you take. These include vitamins, herbs, eye drops, and creams. Any problems you or family members have had with anesthesia. Any bleeding problems you have. Any surgeries you've had. Any medical problems you have. Whether you're pregnant or may be pregnant. Any trouble you have lying on your belly. What are the risks? Your health care provider will talk with you about risks. These may include: Movement of the lead away from where it was placed. Infection. Bleeding. Damage to nearby structures like nerves near the spine. Pain. Allergic reactions to medicines. What happens before? Medicines Ask about changing or stopping: Any medicines you take. Any vitamins, herbs, or supplements you take. Do not take aspirin or ibuprofen unless you're told to. General instructions Eat and drink only as you've been told. For your safety, you may: Need to wash your skin with a soap that kills germs. Get antibiotics. Have your procedure site marked. Have hair removed at the procedure site. Plan to have a responsible adult drive you home from the hospital or clinic. You may not be  allowed to drive. What happens during the procedure to place the leads? You'll be asked to lie on your belly or side. You'll be given anesthesia to numb the area and keep you from feeling pain. A long needle will be put into your lower back and guided to the place where the sacral nerves leave the spine. X-rays will be used to guide the needle to the right spot. The position of the needle will be tested. If the needle is in the right spot, your toes or feet may move. You may feel a tingling in your legs. Or you may get a pulsing and tapping feeling in the genital and rectal areas. The lead will be put through the needle and into your body. The lead will be anchored in place close to your sacral nerves. The end of the lead that's outside your body will be attached to the nerve stimulator device. A bandage will be placed to cover the lead. Your provider will program the rate at which the nerve stimulator will send the electric pulses. These steps may vary. Ask what you can expect. What happens after? You'll be watched closely until you leave. This includes checking your pain level, blood pressure, heart rate, and breathing rate. You'll be taught how to use the nerve stimulator device for the nerve test. This information is not intended to replace advice given to you by your health care provider. Make sure you discuss any questions you have with your health care provider. Document Revised: 05/31/2023 Document Reviewed: 05/31/2023 Elsevier Patient Education  2024 [employer].

## 2023-12-27 ENCOUNTER — Ambulatory Visit: Payer: Medicare HMO | Admitting: Podiatry

## 2024-02-29 ENCOUNTER — Other Ambulatory Visit: Payer: Self-pay

## 2024-02-29 DIAGNOSIS — N200 Calculus of kidney: Secondary | ICD-10-CM

## 2024-02-29 MED ORDER — SODIUM BICARBONATE 650 MG PO TABS: 650.0000 mg | ORAL_TABLET | Freq: Two times a day (BID) | ORAL | 11 refills | Status: DC

## 2024-03-06 ENCOUNTER — Telehealth: Payer: Self-pay

## 2024-03-06 NOTE — Telephone Encounter (Signed)
 Pharmacy tech called to clarify a prescription of Prazosin written on 02/09/2024 the tech was notified that our office did not write that Rx and has not seen the pt since 12/06/2023 so we have no information supporting the Rx tech voiced her understanding and stated she would make a note of it

## 2024-03-08 ENCOUNTER — Telehealth: Payer: Self-pay

## 2024-03-08 NOTE — Telephone Encounter (Signed)
 Pharmacy called to confirm if patient should be taking prazosin and tamsulosin .  Per MD patient should only be taking tamsulosin .  Pharmacy made aware.

## 2024-04-21 ENCOUNTER — Other Ambulatory Visit: Payer: Self-pay | Admitting: Urology

## 2024-04-21 DIAGNOSIS — N200 Calculus of kidney: Secondary | ICD-10-CM

## 2024-05-30 NOTE — Progress Notes (Signed)
 [facility]  BARIATRIC SURGERY REVISION CONSULT - FOLLOW-UP  CC: obesity, hx sleeve, GERD, metabolic syndrome.  HISTORY OF PRESENT ILLNESS: The patient is a 48 y.o. female who last underwent a sleeve gastrectomy by Dr. Virgel in 2020.  Postop course was c/b by an extended hospital stay for inability to tolerate PO intake.  She was started on TPN and monitored closely by both Dr. Virgel and our dietitian postoperatively.  She had a total colectomy secondary to colonic inertia problems.   Does not have an ostomy. Reports loose stools daily since colectomy. She reports severe reflux symptoms that she states are so bad that she has to have something done. See Dr. Pollyann note from 8/13 for discussion/ plan- but she is pursuing a surgical intervention at this time to revise sleeve, to N-sleeve, fundoplication/ possible nissen/wrap or  RYGB.    Saw Dr. Tomi 04/17/24 for 2nd opinion due to complex med/surgical hx, total colectomy. Also had EGD 05/10/24: Normal esophagus. - A sleeve gastrectomy was found, characterized by very large retained fundus with normal sleeve anatomy down below without spiral or stenosis. - Normal examined duodenum. - No specimens collected.   Per Dr. Emilee initial visit note: There remains a concern that converting to a Roux-en-Y gastric bypass could result in increased bowel movements and potential malabsorptive complications. While we could consider shortening the limb lengths to reduce this risk, gastric bypass is not high on my list of preferred treatment options.   First and foremost, non-surgical management remains the priority. Weight loss would be my top recommendation. If ZepBound becomes an option for her, that would be ideal.   Second, we could consider endoscopic approaches. These may include dilation of the incisura using an achalasia balloon, dilation of the  pylorus, and repeat Botox injection to evaluate for symptom relief.   If those options fail, we could then consider surgical interventions.   Her upper GI series shows a fairly dilated fundus. Manometry reveals poor esophageal motility. Surgical considerations could include:              .           A falciform ligament wrap              .           Resection of the neofundus without additional wrapping              .           N-sleeve (Nissen wrap of the sleeve fundus)              .           Conversion to gastric bypass (with caution given her prior colectomy) Weight loss:  The patient's pre-operative weight prior to surgery was 260 lbs.  The lowest weight the patient was able to achieve post-surgery was 202 lbs and was able to maintain this for several months, then started having wt regain.   Weight today is 255 lbs (BMI 33.7)   Medical hx/Comorbid conditions:  --prediabetes, metabolic syndrome, fatty liver, hx of total colectomy, migraines, GERD, PTSD/Depression, chronic pain   -Abdominal surgeries: lap sleeve, total colectomy, hysterectomy, vaginal cuff repair  Conditions/medications that would require optimization or clearance prior to surgery:  --abnormal ECG/prolonged QT- Cardiology clearance  Dietary habits:  --Current dietary, hydration, exercise and supplementation habits and recommendations for optimization discussed in detail at RD appt today.  Patient saw Dr. Rosalina, see notes below; then had f/u with Dr. Tomi to consider for surgery/2nd opinion.  UGI: Impression: Mildly decreased esophageal motility is a chronic finding, by description on the prior exam it may have improved. Otherwise unremarkable upper GI status post gastric sleeve procedure.    She has elevated acid levels s/p LSG with BMI 36. She also has poor motility with 9 failed swallows and 10 incomplete bolus clearances.  She is interested in revision surgery to improve her GERD and comorbidities as  well as her overall quality of life.  She has seen us  to discuss medical weight loss options, tried phentermine in the past; no new meds ordered due to contraindications and/or pt preference. Takes metformin. Hx of kidney stones 1 year ago, no topiramate at this time.  Contrave may be a future option since she reports she takes naltrexone currently.  In clinic today measurements were Ht:185.4 cm (6' 1) Wt:(!) 116 kg (255 lb 12.8 oz) ADJ:Anib surface area is 2.44 meters squared.SABRA BMI is Body mass index is 33.75 kg/m.. Ideal body weight is Weight in (lb) to have BMI = 25: 189.1.  The excess body weight interferes with activities of daily living and negatively impacts quality of life.  The patient reports the following medical conditions.  Wt Readings from Last 10 Encounters:  06/06/24 (!) 116 kg (255 lb 12.8 oz)  05/29/24 (!) 118.4 kg (261 lb)  05/21/24 (!) 119.2 kg (262 lb 12.6 oz)  05/15/24 (!) 117.8 kg (259 lb 9.6 oz)  05/02/24 (!) 117.8 kg (259 lb 12.8 oz)  04/17/24 (!) 114.7 kg (252 lb 12.8 oz)  04/10/24 (!) 117.5 kg (259 lb)  03/27/24 (!) 114.3 kg (252 lb)  03/22/24 (!) 114.3 kg (252 lb)  03/15/24 (!) 114.4 kg (252 lb 3.2 oz)    MEDICAL CONDITIONS: Patient Active Problem List  Diagnosis  . Diarrhea following gastrointestinal surgery  . Status post total colectomy for colonic inertia (05/2016)  . History of constipation  . Bilateral lower abdominal pain  . CRP elevated  . Fatty liver  . Elevated liver function tests  . Confusion and disorientation  . Clostridium difficile diarrhea  . H/O multiple sclerosis (CMS/HHS-HCC)  . Vasovagal syncope  . Atypical chest pain  . Abnormal ECG  . Kidney stones  . Pre-diabetes  . Metabolic syndrome  . Bloating  . Episode of recurrent major depressive disorder ()  . Gastroesophageal reflux disease without esophagitis  . Excessive daytime sleepiness  . PTSD (post-traumatic stress disorder)  . Moderate episode of recurrent major  depressive disorder (CMS/HHS-HCC)  . Rosacea  . History of nonmelanoma skin cancer  . Intractable migraine without aura and with status migrainosus  . S/P laparoscopic sleeve gastrectomy  . Generalized anxiety disorder  . Panic disorder  . Obesity (BMI 30-39.9), unspecified  . Gastroesophageal reflux disease  . S/P total colectomy  . Cervicalgia  . Neuropathy of left common peroneal nerve at head of fibula  . Anxiety and depression  . Seizure-like activity (CMS/HHS-HCC)  . Idiopathic peripheral neuropathy    MEDICATIONS: Current Outpatient Medications  Medication Sig Dispense Refill  . acetaminophen  (TYLENOL ) 500 MG tablet Take 2 tablets (1,000 mg total) by mouth every 6 (six) hours as needed for Pain 30 tablet 0  . allopurinoL  (ZYLOPRIM ) 300 MG tablet Take 1 tablet by mouth at bedtime    . celecoxib (CELEBREX) 200 MG capsule Take 200 mg by mouth once daily    . clonazePAM  (KLONOPIN ) 0.5 MG  tablet Take 0.5 tab by mouth every morning and 1 tab every night 45 tablet 5  . colestipoL (COLESTID) 1 gram tablet Take 2 tablets (2 g total) by mouth 2 (two) times daily 120 tablet 11  . Compound Medication doxepin 1.9%, diclofenac  3%, cyclobenzaprine  2%, lidocaine  5%, gabapentin  2%. Apply to affected area 4x/day prn. 1 each 11  . Compound Medication Take 4.5 mg by mouth every morning Med Name: Naltrexone 90 each 0  . dexlansoprazole (DEXILANT) 60 mg DR capsule Take 1 capsule (60 mg total) by mouth once daily 30 capsule 11  . diphenhydrAMINE  (BENADRYL ) 25 mg capsule Take 25 mg by mouth every 6 (six) hours as needed (Rescue for headaches) Takes in combination with the phenergan  for migraine therapy.    . DULoxetine  (CYMBALTA ) 60 MG DR capsule TAKE 2 CAPSULES ONE TIME DAILY 180 capsule 3  . ezetimibe  (ZETIA ) 10 mg tablet Take 10 mg by mouth every morning    . famotidine (PEPCID) 40 MG tablet Take 1 tablet (40 mg total) by mouth 2 (two) times daily 60 tablet 11  . lamoTRIgine  (LAMICTAL ) 100 MG tablet  TAKE 3 TABLETS EVERY DAY 270 tablet 3  . levocetirizine (XYZAL) 5 MG tablet Take 5 mg by mouth every morning    . lidocaine  (XYLOCAINE ) 2 % solution Swish and swallow 15 mLs 4 (four) times daily as needed for Pain Mix 15mL of lidocaine  with 15mL of over the counter maalox. Take up to 4 times daily, as needed. 600 mL 0  . lurasidone (LATUDA) 40 mg tablet Take 1 tablet (40 mg total) by mouth daily with breakfast 30 tablet 2  . meclizine (ANTIVERT) 12.5 mg tablet Take 1-2 tablets (12.5-25 mg total) by mouth 3 (three) times daily as needed for Dizziness 60 tablet 2  . metFORMIN (GLUCOPHAGE) 500 MG tablet TAKE 1 TABLET TWICE DAILY WITH MEALS 180 tablet 3  . methocarbamoL (ROBAXIN) 500 MG tablet Take 2 tablets (1,000 mg total) by mouth 3 (three) times daily as needed 180 tablet 2  . multivitamin with iron-minerals (SUPER THERA VITE M) tablet Take 1 tablet by mouth every morning       . naratriptan (AMERGE) 2.5 MG tablet Take 1 tablet (2.5 mg total) by mouth as directed May take a second dose after 4 hours if needed. 12 tablet 11  . ondansetron  (ZOFRAN ) 8 MG tablet Take 8 mg by mouth every 8 (eight) hours as needed for Nausea    . phenazopyridine  (PYRIDIUM ) 200 MG tablet Take 200 mg by mouth as needed for Pain    . prazosin (MINIPRESS) 2 MG capsule Take 1 capsule (2 mg total) by mouth at bedtime 30 capsule 5  . pregabalin  (LYRICA ) 100 MG capsule Take 1 capsule (100 mg total) by mouth 3 (three) times daily for 180 days 270 capsule 1  . pregabalin  (LYRICA ) 50 MG capsule Take 1 capsule (50 mg total) by mouth 3 (three) times daily 90 capsule 11  . promethazine  (PHENERGAN ) 25 MG tablet TAKE 1 TABLET EVERY 8 HOURS AS NEEDED FOR NAUSEA 90 tablet 1  . QUEtiapine  (SEROQUEL ) 50 MG tablet Take 1 tablet (50 mg total) by mouth at bedtime 30 tablet 5  . rosuvastatin  (CRESTOR ) 5 MG tablet Take 5 mg by mouth at bedtime    . venlafaxine (EFFEXOR) 25 MG tablet Take 1 tablet (25 mg total) by mouth once daily for 180 days 30  tablet 5  . zolpidem  (AMBIEN ) 10 mg tablet Take 1 tablet (10 mg total)  by mouth at bedtime as needed for Sleep for up to 180 days 30 tablet 5  . metroNIDAZOLE  (METROGEL ) 0.75 % gel Apply to the face 1-2 times daily (Patient not taking: Reported on 05/21/2024) 45 g 6   Current Facility-Administered Medications  Medication Dose Route Frequency Provider Last Rate Last Admin  . onabotulinumtoxinA (BOTOX) solution 155 Units  155 Units Intramuscular As Directed Babara Rolland Cake, NP   155 Units at 05/29/24 1040    PAST MEDICAL HISTORY:  Past Medical History:  Diagnosis Date  . Adrenal insufficiency (CMS/HHS-HCC) 12/02/2017  . Allergy 1980  . Anesthesia complication    wakes up with migraine after anesthesia  . Anxiety 1999  . Arthritis    osteoarthritis  . Bilateral lower abdominal pain 07/27/2017  . Cancer (CMS/HHS-HCC) 2016  . CRP elevated 07/27/2017  . Depression 1999  . Diarrhea following gastrointestinal surgery 06/06/2017  . Elevated liver function tests   . Endometriosis   . Fatty liver   . GERD (gastroesophageal reflux disease)   . History of abnormal cervical Pap smear 1995  . History of motion sickness   . Metabolic syndrome 11/12/2018   Abdominal obesity; triglycerides >150 (284); HDL<50 (39)    . Migraines   . Moderate episode of recurrent major depressive disorder (CMS/HHS-HCC) 10/30/2019  . MS (multiple sclerosis) (CMS/HHS-HCC)   . Nephrolithiasis   . PCOS (polycystic ovarian syndrome)   . PONV (postoperative nausea and vomiting)   . Poor intravenous access   . PTSD (post-traumatic stress disorder) 10/30/2019  . Seizures (CMS/HHS-HCC) 2018   when cortisol gets low  she passes out and has seizures  . Vasovagal syncope 03/05/2018    PAST SURGICAL HISTORY:  Past Surgical History:  Procedure Laterality Date  . MELANOMA EXCISION   2000   removed from face  . CYSTOSCOPY W/ URETERAL STENT PLACEMENT LEFT Left 02/02/2015  . ROBOTIC ASSISTED TOTAL HYSTERECTOMY WITH  BILATERAL SALPINGO OOPHERECTOMY Bilateral 05/11/2015  . REPAIR VAGINAL CUFF  08/21/2015  . LAPAROSCOPIC COLECTOMY PARTIAL W/ANASTAMOSIS  05/2016  . SMALL INTESTINE ENDOSCOPY W/BIOPSY N/A 10/11/2017   Procedure: Small Bowel Enteroscopy;  Surgeon: Debarah Charlie Franky Mickey., MD;  Location: [facility];  Service: Gastroenterology;  Laterality: N/A;  . SIGMOIDOSCOPY FLEXIBLE W/BIOPSY N/A 10/11/2017   Procedure: SIGMOIDOSCOPY, FLEXIBLE; WITH BIOPSY, SINGLE OR MULTIPLE;  Surgeon: Debarah Charlie Franky Mickey., MD;  Location: [facility];  Service: Gastroenterology;  Laterality: N/A;  . ESOPHAGOGASTRODUODENOSCOPY ENDOSCOPY  10/11/2017  . EGD N/A 08/15/2018   Procedure: EGD, FLEXIBLE, TRANSORAL; DIAGNOSTIC;  Surgeon: Virgel Gobble, MD;  Location: [facility];  Service: General Surgery;  Laterality: N/A;  . EGD N/A 03/14/2019   Procedure: EGD;  Surgeon: Jain-Spangler, Kunoor, MD;  Location: [facility];  Service: General Surgery;  Laterality: N/A;  . SIGMOIDOSCOPY FLEXIBLE W/BIOPSY N/A 03/05/2020   Procedure: SIGMOIDOSCOPY, FLEXIBLE; WITH BIOPSY, SINGLE OR MULTIPLE;  Surgeon: Louann Alm Hora, MD;  Location: [facility];  Service: Gastroenterology;  Laterality: N/A;  clip placed at site of biopsy pt doesn't have sigmoid due to surgery, Dr. Louann thought it should be ileoscopy but those codes are only through stoma  . ESOPHAGOGASTRODOUDENOSCOPY W/BIOPSY N/A 09/02/2020   Procedure: Upper Endoscopy (EGD);  Surgeon: Cherylin Patton FALCON, MD;  Location: [facility];  Service: Gastroenterology;  Laterality: N/A;  per Dr. Cherylin  . SIGMOIDOSCOPY FLEXIBLE N/A 11/12/2020   Procedure: Flexible Sigmoidoscopy;  Surgeon: Alvan Gwendlyn Bars, MD;  Location: [facility];  Service: Gastroenterology;  Laterality: N/A;  . EGD  N/A 10/09/2023   Procedure: EGD - Upper Endoscopy with FLIP;  Surgeon: Selmer Leita Norris, DO;  Location: [facility];  Service: Gastroenterology;   Laterality: N/A;  . ESOPHAGEAL DILATION N/A 10/09/2023   Procedure: FLIP - Endoluminal Functional Imaging Probe Study;  Surgeon: Selmer Leita Norris, DO;  Location: [facility];  Service: Gastroenterology;  Laterality: N/A;  . INTERSTIM IMPLANT FIRST STAGE   10/26/2023  . INTERSTIM IMPLANT SECOND STAGE  11/09/2023  . NEUROPLASTY MAJOR PERIPHERAL NERVE EXTREMITY Left 12/07/2023   Procedure: Left peroneal revision decompression;  Surgeon: Lucillie Marylen Mutton, MD;  Location: [facility];  Service: Orthopedics;  Laterality: Left;  . TRANSFER ADJACENT TISSUE LEG Left 12/07/2023   Procedure: ADJACENT TISSUE TRANSFER OR REARRANGEMENT, LEG; DEFECT 10.0 SQ CM TO 30.0 SQ CM;  Surgeon: Lucillie Marylen Mutton, MD;  Location: [facility];  Service: Orthopedics;  Laterality: Left;  . ESOPHAGOGASTRODOUDENOSCOPY W/INJECTION N/A 01/08/2024   Procedure: EGD - Upper Endoscopy with FLIP and Botulinum Toxin Injection;  Surgeon: Louann Alm Hora, MD;  Location: [facility];  Service: Gastroenterology;  Laterality: N/A;  . ESOPHAGEAL DILATION N/A 01/08/2024   Procedure: FLIP - ESOPHAGEAL BALLOON DISTENSION STUDY;  Surgeon: Louann Alm Hora, MD;  Location: [facility];  Service: Gastroenterology;  Laterality: N/A;  . EGD N/A 05/10/2024   Procedure: ESOPHAGOGASTRODUODENOSCOPY, FLEXIBLE, TRANSORAL; DIAGNOSTIC, INCLUDING COLLECTION OF SPECIMEN(S) BY BRUSHING OR WASHING, WHEN PERFORMED (SEPARATE PROCEDURE) - DILATION;  Surgeon: Portenier, Lonell Craze, MD;  Location: [facility];  Service: General Surgery;  Laterality: N/A;  . CARPAL TUNNEL RELEASE Bilateral    ~2014  . COLON SURGERY    . gastric sleeve     ~2019-2020  . knee surgery Bilateral    surgery to straighten knees  . peroneal nerve release Bilateral    right 12/23, left 11/11/2022    FAMILY HISTORY:  Family History  Problem Relation Name Age of Onset  . Neuropathy Mother Andres   . High blood pressure (Hypertension) Mother Andres   . Depression Mother  Andres   . Melanoma Mother Vicky        acral, required immunotherapy  . Anxiety Mother Andres   . Bipolar disorder Mother Andres   . Obesity Mother Andres   . Cancer Mother Andres   . Diabetes Mother Andres   . Psychosis Mother Andres   . Schizophrenia Mother Andres   . Suicidality Mother Andres        multiple suicide attempts  . Basal cell carcinoma Mother Andres   . Liver cancer Father Velma   . Cancer Father [place]   . Asthma Son Marsa   . Anxiety Son Marsa   . Anxiety Maternal Uncle Wayne   . Cancer Maternal Uncle [place]   . Anxiety Paternal Aunt Karyl   . Neuropathy Maternal Grandmother Odell   . Anxiety Maternal Grandmother Odell   . High blood pressure (Hypertension) Maternal Grandmother Odell   . Stroke Maternal Grandmother Odell   . Diabetes Maternal Grandmother Odell   . Heart disease Maternal Grandmother Odell   . Coronary Artery Disease (Blocked arteries around heart) Maternal Grandmother Odell   . Heart failure Maternal Grandmother Odell   . Bipolar disorder Maternal Grandmother Odell        hospitalized  . Suicidality Maternal Grandmother Odell        suicide attempt  . Alzheimer's disease Paternal Grandmother Maye   . Hip fracture Paternal Grandmother Maye   . Osteoporosis (Thinning of bones) Paternal Grandmother Maye   .  Colon cancer Neg Hx    . Stomach cancer Neg Hx    . Inflammatory bowel disease Neg Hx    . Celiac disease Neg Hx    . Esophageal cancer Neg Hx    . Anesthesia problems Neg Hx      SOCIAL HISTORY:  Social History   Socioeconomic History  . Marital status: Married    Spouse name: Gordy  . Number of children: 1  . Highest education level: Associate degree: occupational, Scientist, product/process development, or vocational program  Occupational History  . 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 Smoking status: Never  . Smokeless tobacco: Never  Vaping Use  . Vaping status: Never Used  Substance and Sexual Activity  . Alcohol use: No  .  Drug use: No  . Sexual activity: Yes    Partners: Male    Birth control/protection: Post-menopausal    Comment: Husband  Social History Narrative   Former Producer, television/film/video for 20 years. Disabled since ~2010 for anxiety. Biological son 57. Three step-children with current husband of 14 years. Tech school degree.   Social Drivers of Corporate investment banker Strain: Low Risk  (03/22/2024)   Overall Financial Resource Strain (CARDIA)   . Difficulty of Paying Living Expenses: Not hard at all  Food Insecurity: No Food Insecurity (03/22/2024)   Hunger Vital Sign   . Worried About Programme researcher, broadcasting/film/video in the Last Year: Never true   . Ran Out of Food in the Last Year: Never true  Transportation Needs: No Transportation Needs (03/22/2024)   PRAPARE - Transportation   . Lack of Transportation (Medical): No   . Lack of Transportation (Non-Medical): No  Housing Stability: Low Risk  (03/22/2024)   Housing Stability Vital Sign   . Unable to Pay for Housing in the Last Year: No   . Number of Times Moved in the Last Year: 0   . Homeless in the Last Year: No   GERD score= 75  REVIEW OF SYSTEMS: Review of Systems: GI: See HPI. Cardiovascular: Denies CP, hx of heart attack, hx stroke. Pulmonary: some SOB, no cough. Hematology: Denies use of blood thinners, hx blood clot.  Musculoskeletal: + back pain, joint pain, .  PHYSICAL EXAMINATION:   BP 123/78 (BP Location: Left upper arm, Patient Position: Sitting, BP Cuff Size: XL)   Pulse 93   Ht 185.4 cm (6' 1)   Wt (!) 116 kg (255 lb 12.8 oz)   LMP  (LMP Unknown)   SpO2 97%   BMI 33.75 kg/m   GENERAL: Alert, in no acute distress. PSYCH: Mood and affect appropriate. HEENT: Pupils equal. EOMs grossly normal.  NECK: Supple, trachea was in midline. LUNGS: Non-labored breathing. CARDIO: Regular rate. ABDOMEN: Protuberant.  Lap sleeve, lap hysterectomy/salpingectomy/oopherectomy; Colectomy.  Lab Results  Component Value Date   TSH 1.15 07/12/2023    Lab Results  Component Value Date   WBC 12.0 (H) 03/12/2024   HGB 13.3 03/12/2024   HCT 41.4 03/12/2024   MCV 92 03/12/2024   PLT 272 03/12/2024   Most recent 25 OH Vitamin D value Lab Results  Component Value Date   VITD 36 07/12/2023    Lab Results  Component Value Date   IRON 132 07/12/2023   TIBC 280 07/12/2023   FERRITIN 51 07/12/2023   Lab Results  Component Value Date   HGBA1C 5.5 04/12/2023   HGBA1C 5.7 07/17/2018    Problem List Items Addressed This Visit  Endocrine   Metabolic syndrome - Primary   Relevant Orders   25 OH Vitamin D   Ferritin   Folate   Hemoglobin A1C   Iron and Total Iron Binding Capacity (TIBC)   Magnesium    Phosphorus   Parathyroid Hormone (PTH)   Vitamin B12   Vitamin A    Vitamin B1 (Thiamine ), Blood   Vitamin K1 - SENDOUT [facility]   Zinc , Serum or Plasma - SENDOUT [facility]   Obesity (BMI 30-39.9), unspecified   Relevant Orders   25 OH Vitamin D   Ferritin   Folate   Hemoglobin A1C   Iron and Total Iron Binding Capacity (TIBC)   Magnesium    Phosphorus   Parathyroid Hormone (PTH)   Vitamin B12   Vitamin A    Vitamin B1 (Thiamine ), Blood   Vitamin K1 - S[facility]OUT [facility]   Zinc , Serum or Plasma - SENDOUT [facility]     FEN/GI   Gastroesophageal reflux disease without esophagitis   Relevant Orders   25 OH Vitamin D   Ferritin   Folate   Hemoglobin A1C   Iron and Total Iron Binding Capacity (TIBC)   Magnesium    Phosphorus   Parathyroid Hormone (PTH)   Vitamin B12   Vitamin A    Vitamin B1 (Thiamine ), Blood   Vitamin K1 - S[facility]OUT [facility]   Zinc , Serum or Plasma - SENDOUT [facility]   Other Visit Diagnoses       Postsurgical malabsorption (HHS-HCC)       Relevant Orders   25 OH Vitamin D   Ferritin   Folate   Hemoglobin A1C   Iron and Total Iron Binding Capacity (TIBC)   Magnesium    Phosphorus   Parathyroid Hormone (PTH)   Vitamin B12   Vitamin A    Vitamin B1 (Thiamine ), Blood   Vitamin K1 - SENDOUT  [facility]   Zinc , Serum or Plasma - SENDOUT [facility]     Status post bariatric surgery       Relevant Orders   25 OH Vitamin D   Ferritin   Folate   Hemoglobin A1C   Iron and Total Iron Binding Capacity (TIBC)   Magnesium    Phosphorus   Parathyroid Hormone (PTH)   Vitamin B12   Vitamin A    Vitamin B1 (Thiamine ), Blood   Vitamin K1 - SENDOUT [facility]   Zinc , Serum or Plasma - SENDOUT [facility]       ASSESSMENT & PLAN:  JULLIAN PREVITI is a pleasant 48 y.o. year old female who presents to clinic today for evaluation for possible revisional bariatric surgery from sleeve gastrectomy to possible nissen/sleeve wrap or other procedure for severe GERD.  She has a history of obesity and other comorbidities listed above. Her current BMI is Body mass index is 33.75 kg/m., making her a possible candidate.  Surgery discussion: We discussed the general revision pathway, that it is overall similar to requirements prior to primary surgery, and that visits with PA or RD to optimize dietary habits are a program requirement.  We discussed that during this time, we can continue medical weight loss to either prepare for, or possibly, if very successful, avoid revision surgery.   In addition, patient spent 30+ minutes going over higher risk nature of revision surgeries with our nursing team, as well as the fact that weight loss is not as predictable/reliable as with primary procedures. Many revision options also increase malabsorption, resulting in higher risk of vitamin and nutrition deficiencies, as well as increased frequency and urgency of bowel habits.  In order to proceed with revisional procedure, we will have to obtain: 1) EGD/UGI - done 2) discussion with surgeon on revision options- Dr. Tomi- Done 3) Labs- ordered  4) CXR- done 5) EKG- done- abnormal- referred for cardio clearance 6) RD clearance- Needs f/u visit/ Follow up pending 7) Psych consult- to be scheduled at checkout 8)  Additional requirements, per insurance policy: [employer] medicare, needs 2 additional PA/RD visits. 9) Additional clearances: cardiology, neurology Has sleep study scheduled next week to r/o OSA   Pt is encouraged to call the office with any questions or concerns in the meantime and understands that Dr. Emilee nurse, Eleanor, will be her primary point of contact in navigating path toward revision surgery.   Pt demonstrates understanding and there are no barriers to education.    I spent a total of 45 minutes in both face-to-face and non-face-to-face activities for this visit on the date of this encounter, including counseling patient on weight loss revision options, insurance requirements, reviewing diagnostic studies, ordering labs, and completing medical documentation.  Attestation Statement:   I personally performed the service, non-incident to. (WP)   MARA RIVERA SANCHEZ, PA-C

## 2024-06-06 NOTE — Progress Notes (Signed)
 Department of Physical Therapy & Occupational Therapy Visit Date: 06/06/2024 Clinic Location: [facility] [address] [place] [address] Dept: [phone] Loc: [phone] Visit Number: 1 for current Episode of Care   SUMMARY ASSESSMENT  Denise Avila presents for pre-operative PT evaluation for metabolic and weight loss surgery. The patient demonstrates a           Gait Velocity (m/sec) - Self Selected speed: 1.19 m/sec, Chair Stands: 10 in 30 seconds indicating inadequate lower extremity strength and endurance, and does have pain-limited activity. Other significant comorbidities include preDM, migraines, GERD, PTSD/depression, metabolic syndrome, GAD indicating patient will benefit from skilled PT intervention to address the above stated impairments, participate in a supervised exercise program, and assist the patient in maximizing their functional mobility. The patient was provided with education on pre and post-operative activity, as noted below. Pt is already established with lymph PT. The patient will follow up with established POC.  SUBJECTIVE  Denise Avila is a 48 y.o. year old female referred to Physical Therapy for evaluation of her    ICD-10-CM  1. Muscular deconditioning  R29.898  2. Muscle weakness (generalized)  M62.81   History Related to Current Episode: Denise Avila presents to [facility] for revision patient evaluation, with a BMI of 33.75 and previous unsuccessful attempts at weight loss. The patient does not engage in regular physical activity. Self-reported barriers to exercise or activity participation include   recovering from recent surgery, fatigue, feeling unwell. Pt reports that she had previously been doing water  aerobics 3x/week but has not yet returned to this following surgery. Pt has been working with lymphedema PT and intermittently does  HEP.   Patient goals: to improve health   Pain Intensity (0-10):   Pain Assessment Pain Assessment %%: 0-10 Pain Score %%:   4 Pain Type: Chronic pain Pain Loc: Leg Pain Orientation: Bilateral     - (0 = completely disabled, 100 equals full function)  Current Function: (including Participation Restrictions:) Prior Level of Function: Independent Current Level of Function: Independent  Barriers to Patient Learning: None Signs Of Abuse/Neglect: No. History of Falls in the Past 90 Days: no Falls Risk Factors:  none  Past Medical History   Past Medical History:  Diagnosis Date  . Adrenal insufficiency (CMS/HHS-HCC) 12/02/2017  . Allergy 1980  . Anesthesia complication    wakes up with migraine after anesthesia  . Anxiety 1999  . Arthritis    osteoarthritis  . Bilateral lower abdominal pain 07/27/2017  . Cancer (CMS/HHS-HCC) 2016  . CRP elevated 07/27/2017  . Depression 1999  . Diarrhea following gastrointestinal surgery 06/06/2017  . Elevated liver function tests   . Endometriosis   . Fatty liver   . GERD (gastroesophageal reflux disease)   . History of abnormal cervical Pap smear 1995  . History of motion sickness   . Metabolic syndrome 11/12/2018   Abdominal obesity; triglycerides >150 (284); HDL<50 (39)    . Migraines   . Moderate episode of recurrent major depressive disorder (CMS/HHS-HCC) 10/30/2019  . MS (multiple sclerosis) (CMS/HHS-HCC)   . Nephrolithiasis   . PCOS (polycystic ovarian syndrome)   . PONV (postoperative nausea and vomiting)   . Poor intravenous access   . PTSD (post-traumatic stress disorder) 10/30/2019  . Seizures (CMS/HHS-HCC) 2018   when cortisol gets low  she passes out and has seizures  . Vasovagal syncope 03/05/2018    Current  Medications   Current Outpatient Medications  Medication Sig Dispense Refill  . acetaminophen  (TYLENOL ) 500 MG tablet Take 2 tablets (1,000 mg total) by mouth every 6 (six) hours as needed for Pain 30  tablet 0  . allopurinoL  (ZYLOPRIM ) 300 MG tablet Take 1 tablet by mouth at bedtime    . celecoxib (CELEBREX) 200 MG capsule Take 200 mg by mouth once daily    . clonazePAM  (KLONOPIN ) 0.5 MG tablet Take 0.5 tab by mouth every morning and 1 tab every night 45 tablet 5  . colestipoL (COLESTID) 1 gram tablet Take 2 tablets (2 g total) by mouth 2 (two) times daily 120 tablet 11  . Compound Medication doxepin 1.9%, diclofenac  3%, cyclobenzaprine  2%, lidocaine  5%, gabapentin  2%. Apply to affected area 4x/day prn. 1 each 11  . Compound Medication Take 4.5 mg by mouth every morning Med Name: Naltrexone 90 each 0  . dexlansoprazole (DEXILANT) 60 mg DR capsule Take 1 capsule (60 mg total) by mouth once daily 30 capsule 11  . diphenhydrAMINE  (BENADRYL ) 25 mg capsule Take 25 mg by mouth every 6 (six) hours as needed (Rescue for headaches) Takes in combination with the phenergan  for migraine therapy.    . DULoxetine  (CYMBALTA ) 60 MG DR capsule TAKE 2 CAPSULES ONE TIME DAILY 180 capsule 3  . ezetimibe  (ZETIA ) 10 mg tablet Take 10 mg by mouth every morning    . famotidine (PEPCID) 40 MG tablet Take 1 tablet (40 mg total) by mouth 2 (two) times daily 60 tablet 11  . lamoTRIgine  (LAMICTAL ) 100 MG tablet TAKE 3 TABLETS EVERY DAY 270 tablet 3  . levocetirizine (XYZAL) 5 MG tablet Take 5 mg by mouth every morning    . lidocaine  (XYLOCAINE ) 2 % solution Swish and swallow 15 mLs 4 (four) times daily as needed for Pain Mix 15mL of lidocaine  with 15mL of over the counter maalox. Take up to 4 times daily, as needed. 600 mL 0  . lurasidone (LATUDA) 40 mg tablet Take 1 tablet (40 mg total) by mouth daily with breakfast 30 tablet 2  . meclizine (ANTIVERT) 12.5 mg tablet Take 1-2 tablets (12.5-25 mg total) by mouth 3 (three) times daily as needed for Dizziness 60 tablet 2  . metFORMIN (GLUCOPHAGE) 500 MG tablet TAKE 1 TABLET TWICE DAILY WITH MEALS 180 tablet 3  . methocarbamoL (ROBAXIN) 500 MG tablet Take 2 tablets (1,000 mg  total) by mouth 3 (three) times daily as needed 180 tablet 2  . metroNIDAZOLE  (METROGEL ) 0.75 % gel Apply to the face 1-2 times daily (Patient not taking: Reported on 05/21/2024) 45 g 6  . multivitamin with iron-minerals (SUPER THERA VITE M) tablet Take 1 tablet by mouth every morning       . naratriptan (AMERGE) 2.5 MG tablet Take 1 tablet (2.5 mg total) by mouth as directed May take a second dose after 4 hours if needed. 12 tablet 11  . ondansetron  (ZOFRAN ) 8 MG tablet Take 8 mg by mouth every 8 (eight) hours as needed for Nausea    . phenazopyridine  (PYRIDIUM ) 200 MG tablet Take 200 mg by mouth as needed for Pain    . prazosin (MINIPRESS) 2 MG capsule Take 1 capsule (2 mg total) by mouth at bedtime 30 capsule 5  . pregabalin  (LYRICA ) 100 MG capsule Take 1 capsule (100 mg total) by mouth 3 (three) times daily for 180 days 270 capsule 1  . pregabalin  (LYRICA ) 50 MG capsule Take 1 capsule (50 mg total) by mouth 3 (  three) times daily 90 capsule 11  . promethazine  (PHENERGAN ) 25 MG tablet TAKE 1 TABLET EVERY 8 HOURS AS NEEDED FOR NAUSEA 90 tablet 1  . QUEtiapine  (SEROQUEL ) 50 MG tablet Take 1 tablet (50 mg total) by mouth at bedtime 30 tablet 5  . rosuvastatin  (CRESTOR ) 5 MG tablet Take 5 mg by mouth at bedtime    . venlafaxine (EFFEXOR) 25 MG tablet Take 1 tablet (25 mg total) by mouth once daily for 180 days 30 tablet 5  . zolpidem  (AMBIEN ) 10 mg tablet Take 1 tablet (10 mg total) by mouth at bedtime as needed for Sleep for up to 180 days 30 tablet 5   Current Facility-Administered Medications  Medication Dose Route Frequency Provider Last Rate Last Admin  . onabotulinumtoxinA (BOTOX) solution 155 Units  155 Units Intramuscular As Directed Babara Rolland Cake, NP   155 Units at 05/29/24 1040    Past Surgical History   Past Surgical History:  Procedure Laterality Date  . MELANOMA EXCISION   2000   removed from face  . CYSTOSCOPY W/ URETERAL STENT PLACEMENT LEFT Left 02/02/2015  . ROBOTIC  ASSISTED TOTAL HYSTERECTOMY WITH BILATERAL SALPINGO OOPHERECTOMY Bilateral 05/11/2015  . REPAIR VAGINAL CUFF  08/21/2015  . LAPAROSCOPIC COLECTOMY PARTIAL W/ANASTAMOSIS  05/2016  . SMALL INTESTINE ENDOSCOPY W/BIOPSY N/A 10/11/2017   Procedure: Small Bowel Enteroscopy;  Surgeon: Debarah Charlie Franky Mickey., MD;  Location: [facility];  Service: Gastroenterology;  Laterality: N/A;  . SIGMOIDOSCOPY FLEXIBLE W/BIOPSY N/A 10/11/2017   Procedure: SIGMOIDOSCOPY, FLEXIBLE; WITH BIOPSY, SINGLE OR MULTIPLE;  Surgeon: Debarah Charlie Franky Mickey., MD;  Location: [facility];  Service: Gastroenterology;  Laterality: N/A;  . ESOPHAGOGASTRODUODENOSCOPY ENDOSCOPY  10/11/2017  . EGD N/A 08/15/2018   Procedure: EGD, FLEXIBLE, TRANSORAL; DIAGNOSTIC;  Surgeon: Virgel Gobble, MD;  Location: [facility];  Service: General Surgery;  Laterality: N/A;  . EGD N/A 03/14/2019   Procedure: EGD;  Surgeon: Jain-Spangler, Kunoor, MD;  Location: [facility];  Service: General Surgery;  Laterality: N/A;  . SIGMOIDOSCOPY FLEXIBLE W/BIOPSY N/A 03/05/2020   Procedure: SIGMOIDOSCOPY, FLEXIBLE; WITH BIOPSY, SINGLE OR MULTIPLE;  Surgeon: Louann Alm Hora, MD;  Location: [facility];  Service: Gastroenterology;  Laterality: N/A;  clip placed at site of biopsy pt doesn't have sigmoid due to surgery, Dr. Louann thought it should be ileoscopy but those codes are only through stoma  . ESOPHAGOGASTRODOUDENOSCOPY W/BIOPSY N/A 09/02/2020   Procedure: Upper Endoscopy (EGD);  Surgeon: Cherylin Patton FALCON, MD;  Location: [facility];  Service: Gastroenterology;  Laterality: N/A;  per Dr. Cherylin  . SIGMOIDOSCOPY FLEXIBLE N/A 11/12/2020   Procedure: Flexible Sigmoidoscopy;  Surgeon: Alvan Gwendlyn Bars, MD;  Location: [facility];  Service: Gastroenterology;  Laterality: N/A;  . EGD N/A 10/09/2023   Procedure: EGD - Upper Endoscopy with FLIP;  Surgeon: Selmer Leita Norris, DO;  Location: [facility];  Service: Gastroenterology;  Laterality: N/A;  . ESOPHAGEAL DILATION N/A 10/09/2023   Procedure: FLIP - Endoluminal Functional Imaging Probe Study;  Surgeon: Selmer Leita Norris, DO;  Location: [facility];  Service: Gastroenterology;  Laterality: N/A;  . INTERSTIM IMPLANT FIRST STAGE   10/26/2023  . INTERSTIM IMPLANT SECOND STAGE  11/09/2023  . NEUROPLASTY MAJOR PERIPHERAL NERVE EXTREMITY Left 12/07/2023   Procedure: Left peroneal revision decompression;  Surgeon: Lucillie Marylen Mutton, MD;  Location: [facility];  Service: Orthopedics;  Laterality: Left;  . TRANSFER ADJACENT TISSUE LEG Left 12/07/2023   Procedure: ADJACENT TISSUE TRANSFER OR REARRANGEMENT, LEG; DEFECT 10.0 SQ  CM TO 30.0 SQ CM;  Surgeon: Li, Neill Yun, MD;  Location: [facility];  Service: Orthopedics;  Laterality: Left;  . ESOPHAGOGASTRODOUDENOSCOPY W/INJECTION N/A 01/08/2024   Procedure: EGD - Upper Endoscopy with FLIP and Botulinum Toxin Injection;  Surgeon: Louann Alm Hora, MD;  Location: [facility];  Service: Gastroenterology;  Laterality: N/A;  . ESOPHAGEAL DILATION N/A 01/08/2024   Procedure: FLIP - ESOPHAGEAL BALLOON DISTENSION STUDY;  Surgeon: Louann Alm Hora, MD;  Location: [facility];  Service: Gastroenterology;  Laterality: N/A;  . EGD N/A 05/10/2024   Procedure: ESOPHAGOGASTRODUODENOSCOPY, FLEXIBLE, TRANSORAL; DIAGNOSTIC, INCLUDING COLLECTION OF SPECIMEN(S) BY BRUSHING OR WASHING, WHEN PERFORMED (SEPARATE PROCEDURE) - DILATION;  Surgeon: Portenier, Lonell Craze, MD;  Location: [facility];  Service: General Surgery;  Laterality: N/A;  . CARPAL TUNNEL RELEASE Bilateral    ~2014  . COLON SURGERY    . gastric sleeve     ~2019-2020  . knee surgery Bilateral    surgery to straighten knees  . peroneal nerve release Bilateral    right 12/23, left 11/11/2022    Radiological Studies: See EHR  Outcomes Measures: Neuro/Geri Chair Stands: 10 Cardio/Pulm Gait Velocity: Self-Selected Speed       Number of Meters Walked: 3      Number of Seconds Walked: 2.53 seconds           Gait Velocity (m/sec) - Self Selected speed: 1.19 m/sec             * No data to display                 OBJECTIVE  There were no vitals filed for this visit.  MWLS Examination:  General observation: Pleasant and cooperative  Cognition:  Alert and oriented x4  Inspection: Posture Normal  Squat Normal    Motor: Lower Extremity Right  Left  Hip Flexion  4/5 4/5  Knee Extension  5/5 5/5  Knee Flexion  5/5 5/5  Dorsiflexion  5/5 5/5  Plantarflexion  5/5 5/5        Balance Feet together held 10 seconds   Semi-tandem held 10 seconds   Tandem held 3-9.99 seconds   Single Leg Stance held 3-9.99 seconds      Gait Speed (3 meter walk)              Gait Velocity (m/sec) - Self Selected speed: 1.19 m/sec         Gait Speed Norms for Self-Selected Pace Age Men Women  18-29 1.18 1.11  30-39 1.21 1.15  40-49 1.21 1.14  50-59 1.16 1.15  60-69 1.16 1.05  70-79 1.07 0.99  80-89 0.97 0.95  Bohannon & Wang, 2019  Gait Speed Norms Less than 0.4 m/sec: Household ambulator 0.4 to 0.8 m/sec: Limited community ambulator 0.8 to 1.2 m/sec: [employer]  1.2 m/sec and above: Able to safely cross streets Lennox et al. 2010)   Depiction of walking speeds and the associated outcomes. m/s, meters per second; ?, increased; LE, lower extremity; indep, independent; ADL, activities of daily living; AD, Alzheimer's disease; 2x, two times; yo, years old; d/c, discharge  * Able to climb several flights of stairs **More likely to require long term hospital care than d/c home or nursing home ([address] DELENA Kirks SL, Lusardi M. 2015)       30 Second Chair Stand (no UE use)    Chair Stands: 10     Chair stand norms for age and gender (based on no UE use):  Age 65-64 years: Women 12-17, Men 14-19   Age 25-69 years: Women 11-16, Men 12-18   Age 20-74 years: Women 54-15,  Men 12-17   Age 71-79 years: Women 47-15, Men 11-17   Age 37-84 years: Women 9-14, Men 10-15   Age 67-89 years: Women 8-13, Men 8-14   Age 7-94 years: Women 4-11, Men 7-12   (Rikli and Joshua, 2001) 30 second sit to stand is performed to assess lower extremity strength and endurance Below normative range indicates an increased risk for falls   Today's Treatment Included: Therapeutic Activity: the following interventions were performed to improve patient's independence with and ability to perform transfers, functional activities, home exercise program, post-operative recovery and/or daily activities:  -Discussion with patient on barriers to exercise, benefits of exercise for overall health, endurance, and strength and to optimize outcomes. Discussed recommendations for initiating or continued physical activity both pre- and post-operatively. Reviewed returning to previous activities post operatively, and emphasized pain and fatigue guided return. Emphasized benefits of early activity and mobility postoperatively for improved outcomes, recovery time, and to reduce fatigue following surgery. Reviewed resistance training for maintenance of muscle mass and strength, and long term goals of physical activity for weight maintenance including: 300 minutes of physical activity per week, strength training 2-3 times per week.    Patient was provided with the following exercises/handouts and patient discussion about handout content: [facility], Patient Education Exercise for Health and Weight Maintenance Handout  Self-Selected Preop Goal: walk 10 minutes daily  Frequency, sets, and reps were detailed on the patient's handout and/or video   Patient/Family education: The patient was educated regarding examination findings.  The patient received education regarding home exercises through demonstration/handout/verbal cues, appeared to show willingness to  learn.   ASSESSMENT  In summary, Airyonna presents to PT today with generalized muscle deconditioning since Date of Onset: 03/14/19 (DOS - [facility]). Primary impairments include Limited muscular endurance, Limited cardiopulmonary endurance, and Pain. The patient will benefit from skilled PT intervention to address the above stated impairments, participate in a supervised exercise program, and assist the patient in maximizing their functional mobility  The patient's condition, Plan of Care and Outcome for this episode of care may be negatively impacted by PSH/PMH, Relevant Personal Factors and Co-morbidities that include, but may not be limited to: chronicity pain/symptoms , BMI/body habitus , and mental health history.  Cendy's clinical presentation today is Stable and/or uncomplicated characteristics in nature, and the complexity of clinical decision making required to determine the plan of care today was of low complexity.   Goals: 1. The patient will be independent with HEP.     GOAL MET   Time Frame: Today only   Rehab Potential - Good   PLAN  The plan of care was made with the patient and/or family/caregiver.   Recommended Follow up: No further treatment indicated at this time - pt to continue with established PT POC Planned Potential Interventions include: Aquatic Physical Therapy, Civil engineer, contracting, Education in Diagnosis and Self Management, Education in Risk Reduction Practices, Functional Progression, Joint Mobilization, Neuromuscular Training, Passive ROM/Stretching, Patient/Family/Caregiver Education, Economist, Therapeutic Exercises, Soft Tissue Mobilization, Stretches/ROM Exercises, Strengthening  Plan for next visit: Continued progression of home exercise program and manual therapy intervention as indicated. Note: If patient does not return for follow up visit(s) related to this episode of care, this note will serve as their discharge note from physical therapy.       Billing Information: Visit  Date: 06/06/2024 PT Evaluation or progress note completed today: Yes Date of Onset: 03/14/19 ([facility] - [facility]) Visit Number: 1 Session Start:: 1221 Session Stop:: 1231 Total Time: 10 minutes   PT Eval LOW Complexity CPT 97161: 1    Therapeutic Activity CPT 97530: 4 minutes                    Note: Per the evaluating physical therapist's plan of care, if patient does not return for follow up visit(s) related to this episode of care, this note will serve as their discharge note from physical therapy.  If patient returns to clinic with variance in plan of care, then it may be attributable to one or more of the following factors: preferred clinician availability, appointment time request availability, therapy pool appointment availability, major holiday with clinic closure, care partner availability, patient transportation, conflicting medical appointment, inclement weather, patient illness, and/or scheduling error.  ___________________________________________________                                 Clinical Notes on MyChart: Progress notes documented by your healthcare team will now be available on the MyChart portal.  We believe that patients should be a part of the healthcare team.  We encourage you to review notes after visits and in preparation for upcoming appointments.  This provides the opportunity to review recommendations as well as to prepare questions for your healthcare team to address during your next visit.  If you identify discrepancies in the documentation or have specific questions related to the notes, please bring them to your next scheduled visit to discuss with your Physical Therapist (PT).  With increased transparency, our hope is that we create more trust, better communication, more shared decision-making, and increased satisfaction. Please be aware that these notes will not be discussed over the phone or through My Chart messages.  They will be  discussed only at your next office visit with your provider. *Some images could not be shown.

## 2024-06-07 ENCOUNTER — Ambulatory Visit (HOSPITAL_COMMUNITY)
Admission: RE | Admit: 2024-06-07 | Discharge: 2024-06-07 | Disposition: A | Discharge status: Home / Self Care | Source: Ambulatory Visit | Attending: Urology | Admitting: Urology

## 2024-06-07 DIAGNOSIS — N200 Calculus of kidney: Secondary | ICD-10-CM | POA: Insufficient documentation

## 2024-06-13 ENCOUNTER — Other Ambulatory Visit: Payer: Self-pay | Admitting: Physician Assistant

## 2024-06-13 DIAGNOSIS — Z0000 Encounter for general adult medical examination without abnormal findings: Secondary | ICD-10-CM

## 2024-06-19 ENCOUNTER — Ambulatory Visit (HOSPITAL_COMMUNITY)
Admission: RE | Admit: 2024-06-19 | Discharge: 2024-06-19 | Disposition: A | Discharge status: Home / Self Care | Source: Ambulatory Visit | Attending: Urology | Admitting: Urology

## 2024-06-19 ENCOUNTER — Ambulatory Visit: Admitting: Urology

## 2024-06-19 VITALS — BP 108/74 | HR 111

## 2024-06-19 DIAGNOSIS — N3281 Overactive bladder: Secondary | ICD-10-CM

## 2024-06-19 DIAGNOSIS — N200 Calculus of kidney: Secondary | ICD-10-CM | POA: Insufficient documentation

## 2024-06-19 LAB — URINALYSIS, ROUTINE W REFLEX MICROSCOPIC
Bilirubin, UA: NEGATIVE
Glucose, UA: NEGATIVE
Ketones, UA: NEGATIVE
Leukocytes,UA: NEGATIVE
Nitrite, UA: NEGATIVE
Protein,UA: NEGATIVE
RBC, UA: NEGATIVE
Specific Gravity, UA: 1.02 (ref 1.005–1.030)
Urobilinogen, Ur: 0.2 mg/dL (ref 0.2–1.0)
pH, UA: 6 (ref 5.0–7.5)

## 2024-06-19 NOTE — Progress Notes (Signed)
 06/19/2024 9:27 AM   Denise Avila 27-Aug-1976 [id number]  Referring provider: Alvera Reagin, PA [address] [place],  [place] [address]  Followup OAB and nephrolithiasis   HPI: [name] is a 48yo here for followup for nephrolithiasis and OAB. Renal US  shows bilateral 3-51mm renal calculi. She denies any flank pain. She had Axonics placed 11/2023. She notes her urination was initially improved but then she fell and after that she started feeling the vibration in her buttock. She is having intermittent right abdominal pain.    PMH: Past Medical History:  Diagnosis Date   Abnormal Pap smear of cervix 12/2014   Acid reflux    Adrenal insufficiency (HCC)    Anxiety    Arthritis    Arthritis    Depression    Depression    Endometriosis    Fibroids    age 24   Fibromyalgia    High cholesterol    History of kidney stones    Liver disease    per pt report, will have biopsy Nov 08, 2017   Migraines    Pleurisy 02/2015   while hospitalized    Seizure (HCC)    on meds and no seizures in 2-3 years. Unknown etiology   Skin cancer (melanoma) (HCC)    Syncope and collapse    Uterine cancer [facility])     Surgical History: Past Surgical History:  Procedure Laterality Date   ABDOMINAL HYSTERECTOMY     ABLATION     uterine   COLECTOMY     CYSTOSCOPY W/ URETERAL STENT PLACEMENT Left 02/02/2015   Procedure: CYSTOSCOPY WITH RETROGRADE PYELOGRAM/URETERAL STENT PLACEMENT;  Surgeon: Donnice Brooks, MD;  Location: [facility];  Service: Urology;  Laterality: Left;   CYSTOSCOPY WITH RETROGRADE PYELOGRAM, URETEROSCOPY AND STENT PLACEMENT Bilateral 01/20/2022   Procedure: CYSTOSCOPY WITH RETROGRADE PYELOGRAM, URETEROSCOPY AND STENT PLACEMENT;  Surgeon: Sherrilee Belvie CROME, MD;  Location: [facility];  Service: Urology;  Laterality: Bilateral;   CYSTOSCOPY WITH RETROGRADE PYELOGRAM, URETEROSCOPY AND STENT PLACEMENT Left 11/03/2022   Procedure: CYSTOSCOPY WITH RETROGRADE PYELOGRAM,  URETEROSCOPY AND STENT PLACEMENT;  Surgeon: Sherrilee Belvie CROME, MD;  Location: [facility];  Service: Urology;  Laterality: Left;  pt knows to arrive at 9:00   CYSTOSCOPY WITH URETEROSCOPY AND STENT PLACEMENT Left 02/13/2015   Procedure: LEFT URETEROSCOPY WITH HOLMIUM LASER AND STENT PLACEMENT;  Surgeon: Donnice Brooks, MD;  Location: [facility];  Service: Urology;  Laterality: Left;   DILATION AND CURETTAGE OF UTERUS     ESOPHAGOGASTRODUODENOSCOPY ENDOSCOPY  10/11/2017   @ [facility]; BIOPSY OF LOWER INTESTINE   EXTRACORPOREAL SHOCK WAVE LITHOTRIPSY Left 09/13/2022   Procedure: EXTRACORPOREAL SHOCK WAVE LITHOTRIPSY (ESWL);  Surgeon: Sherrilee Belvie CROME, MD;  Location: [facility];  Service: Urology;  Laterality: Left;   HOLMIUM LASER APPLICATION N/A 02/13/2015   Procedure: HOLMIUM LASER APPLICATION;  Surgeon: Donnice Brooks, MD;  Location: [facility];  Service: Urology;  Laterality: N/A;   INTERSTIM IMPLANT PLACEMENT N/A 10/26/2023   Procedure: RENNA IMPLANT FIRST STAGE;  Surgeon: Sherrilee Belvie CROME, MD;  Location: [facility];  Service: Urology;  Laterality: N/A;   INTERSTIM IMPLANT PLACEMENT N/A 11/09/2023   Procedure: RENNA IMPLANT SECOND STAGE;  Surgeon: Sherrilee Belvie CROME, MD;  Location: [facility];  Service: Urology;  Laterality: N/A;   IR NEPHROSTOMY PLACEMENT RIGHT  07/05/2021   KNEE SURGERY Bilateral 2019   realignment of knees   LAPAROSCOPIC GASTRIC SLEEVE RESECTION     LAPAROSCOPY     x 4  MELANOMA EXCISION  2000   melanoma removed from face, basal cell removed from nose   NEPHROLITHOTOMY Right 07/08/2021   Procedure: NEPHROLITHOTOMY PERCUTANEOUS- with stent placement;  Surgeon: Sherrilee Belvie CROME, MD;  Location: [facility];  Service: Urology;  Laterality: Right;   REPAIR VAGINAL CUFF N/A 08/21/2015   Procedure: REPAIR VAGINAL CUFF, EXAM UNDER ANESTHESIA;  Surgeon: Maurilio Ship, MD;  Location: [facility];  Service: Gynecology;  Laterality: N/A;   ROBOTIC ASSISTED TOTAL HYSTERECTOMY WITH BILATERAL SALPINGO  OOPHERECTOMY Bilateral 05/21/2015   Procedure: XI ROBOTIC ASSISTED TOTAL HYSTERECTOMY WITH BILATERAL SALPINGO OOPHORECTOMY;  Surgeon: Maurilio Ship, MD;  Location: [facility];  Service: Gynecology;  Laterality: Bilateral;   STONE EXTRACTION WITH BASKET Bilateral 01/20/2022   Procedure: STONE EXTRACTION WITH BASKET;  Surgeon: Sherrilee Belvie CROME, MD;  Location: [facility];  Service: Urology;  Laterality: Bilateral;   STONE EXTRACTION WITH BASKET Left 11/03/2022   Procedure: STONE EXTRACTION WITH BASKET;  Surgeon: Sherrilee Belvie CROME, MD;  Location: [facility];  Service: Urology;  Laterality: Left;    Home Medications:  Allergies as of 06/19/2024       Reactions   Penicillins Swelling, Rash   Rash all over [place] and lip swelling. Has patient had a PCN reaction causing immediate rash, facial/tongue/throat swelling, SOB or lightheadedness with hypotension: Yes Has patient had a PCN reaction causing severe rash involving mucus membranes or skin necrosis: Yes Has patient had a PCN reaction that required hospitalization: No If all of the above answers are NO, then may proceed with Cephalosporin use.   Cyanoacrylate Dermatitis   Dermabond   Nortriptyline Other (See Comments)   Severe chest pain    Nsaids Other (See Comments)   Advised not to take due to gastric sleeve    Amoxicillin Rash   Rash all over [place]   Bactrim  [sulfamethoxazole -trimethoprim ] Rash   Ciprofloxacin  Diarrhea, Nausea Only   Tape Rash        Medication List        Accurate as of June 19, 2024  9:27 AM. If you have any questions, ask your nurse or doctor.          acetaminophen  650 MG CR tablet Commonly known as: TYLENOL  Take 1,300 mg by mouth in the morning, at noon, and at bedtime.   allopurinol  300 MG tablet Commonly known as: ZYLOPRIM  TAKE 1 TABLET AT BEDTIME   Bariatric Multivitamins Caps Take 1 capsule by mouth daily.   cetirizine 10 MG tablet Commonly known as: ZYRTEC Take 10 mg by mouth daily.    citalopram  20 MG tablet Commonly known as: CELEXA  Take 20 mg by mouth daily.   clonazePAM  0.5 MG tablet Commonly known as: KLONOPIN  Take 1.5 mg by mouth at bedtime.   diphenhydrAMINE  25 MG tablet Commonly known as: BENADRYL  Take 25 mg by mouth every 6 (six) hours as needed (take with for migraines).   doxycycline  100 MG capsule Commonly known as: VIBRAMYCIN  Take 1 capsule (100 mg total) by mouth every 12 (twelve) hours.   ezetimibe  10 MG tablet Commonly known as: ZETIA  Take 10 mg by mouth in the morning.   famotidine 40 MG tablet Commonly known as: PEPCID Take 40 mg by mouth 2 (two) times daily.   fluconazole  150 MG tablet Commonly known as: DIFLUCAN  Take once. May repeat in 3 days if symptoms fail to fully resolve after first dose.   gabapentin  800 MG tablet Commonly known as: NEURONTIN  Take 2 tablets (1,600 mg total) by mouth 2 (two) times daily.  Gas Relief 250 MG Caps Generic drug: Simethicone  Take 250 mg by mouth 3 (three) times daily as needed (gas/bloating).   Gemtesa  75 MG Tabs Generic drug: Vibegron  Take 1 tablet (75 mg total) by mouth daily.   lamoTRIgine  150 MG tablet Commonly known as: LAMICTAL  Take 150 mg by mouth 2 (two) times daily.   MAGNESIUM  PO Take 1 tablet by mouth daily.   meclizine 12.5 MG tablet Commonly known as: ANTIVERT Take 12.5 mg by mouth 3 (three) times daily as needed for dizziness.   naratriptan 2.5 MG tablet Commonly known as: AMERGE Take 2.5 mg by mouth as needed for migraine.   omeprazole 40 MG capsule Commonly known as: PRILOSEC Take 40 mg by mouth 2 (two) times daily.   phenazopyridine  200 MG tablet Commonly known as: PYRIDIUM  Take 200 mg by mouth 3 (three) times daily as needed for pain.   phentermine 15 MG capsule Take 15 mg by mouth daily.   Polyethyl Glycol-Propyl Glycol 0.4-0.3 % Gel ophthalmic gel Commonly known as: SYSTANE Place 1 Application into both eyes 4 (four) times daily as needed (dry eyes).    prazosin 1 MG capsule Commonly known as: MINIPRESS Take 1 mg by mouth at bedtime.   pregabalin  25 MG capsule Commonly known as: LYRICA  Take 100 mg by mouth 3 (three) times daily.   prochlorperazine  10 MG tablet Commonly known as: COMPAZINE  Take 10 mg by mouth every 6 (six) hours as needed (migraines (nausea)).   promethazine  25 MG tablet Commonly known as: PHENERGAN  Take 1 tablet (25 mg total) by mouth 4 (four) times daily as needed for nausea or vomiting.   QUEtiapine  50 MG tablet Commonly known as: SEROQUEL  Take 50 mg by mouth at bedtime.   RABEprazole 20 MG tablet Commonly known as: ACIPHEX Take 1 tablet by mouth 2 (two) times daily.   rosuvastatin  5 MG tablet Commonly known as: CRESTOR  Take 5 mg by mouth at bedtime.   sodium bicarbonate  650 MG tablet Take 1 tablet (650 mg total) by mouth 2 (two) times daily.   tamsulosin  0.4 MG Caps capsule Commonly known as: FLOMAX  TAKE 1 CAPSULE EVERY DAY AFTER SUPPER   traMADol  50 MG tablet Commonly known as: ULTRAM  Take 1 tablet (50 mg total) by mouth every 6 (six) hours as needed.   Ubrelvy 100 MG Tabs Generic drug: Ubrogepant Take 100 mg by mouth daily as needed (migraine).        Allergies:  Allergies  Allergen Reactions   Penicillins Swelling and Rash    Rash all over [place] and lip swelling. Has patient had a PCN reaction causing immediate rash, facial/tongue/throat swelling, SOB or lightheadedness with hypotension: Yes Has patient had a PCN reaction causing severe rash involving mucus membranes or skin necrosis: Yes Has patient had a PCN reaction that required hospitalization: No If all of the above answers are NO, then may proceed with Cephalosporin use.   Cyanoacrylate Dermatitis    Dermabond     Nortriptyline Other (See Comments)    Severe chest pain     Nsaids Other (See Comments)    Advised not to take due to gastric sleeve    Amoxicillin Rash    Rash all over [place]    Bactrim   [Sulfamethoxazole -Trimethoprim ] Rash   Ciprofloxacin  Diarrhea and Nausea Only   Tape Rash    Family History: Family History  Problem Relation Age of Onset   Hypertension Mother    Melanoma Mother    Breast cancer Mother 18   Cancer -  Other Father        liver   Diabetes Mellitus II Maternal Grandmother    Heart disease Maternal Grandmother    Breast cancer Maternal Grandmother    Cancer - Lung Maternal Grandfather    Osteoporosis Paternal Grandmother    Melanoma Paternal Grandfather     Social History:  reports that she has never smoked. She has never used smokeless tobacco. She reports that she does not drink alcohol and does not use drugs.  ROS: All other review of systems were reviewed and are negative except what is noted above in HPI  Physical Exam: BP 108/74   Pulse (!) 111   LMP 05/04/2015 (Approximate)   Constitutional:  Alert and oriented, No acute distress. HEENT: [place] AT, moist mucus membranes.  Trachea midline, no masses. Cardiovascular: No clubbing, cyanosis, or edema. Respiratory: Normal respiratory effort, no increased work of breathing. GI: Abdomen is soft, nontender, nondistended, no abdominal masses GU: No CVA tenderness.  Lymph: No cervical or inguinal lymphadenopathy. Skin: No rashes, bruises or suspicious lesions. Neurologic: Grossly intact, no focal deficits, moving all 4 extremities. Psychiatric: Normal mood and affect.  Laboratory Data: Lab Results  Component Value Date   WBC 11.0 (H) 07/09/2021   HGB 11.6 (L) 07/09/2021   HCT 35.1 (L) 07/09/2021   MCV 94.1 07/09/2021   PLT 167 07/09/2021    Lab Results  Component Value Date   CREATININE 0.67 01/17/2022    No results found for: PSA  No results found for: TESTOSTERONE  Lab Results  Component Value Date   HGBA1C 5.3 10/13/2021    Urinalysis    Component Value Date/Time   COLORURINE YELLOW (A) 06/21/2017 1735   APPEARANCEUR Clear 12/06/2023 1009   LABSPEC 1.019 06/21/2017  1735   PHURINE 5.0 06/21/2017 1735   GLUCOSEU Negative 12/06/2023 1009   HGBUR NEGATIVE 06/21/2017 1735   BILIRUBINUR Negative 12/06/2023 1009   KETONESUR NEGATIVE 06/21/2017 1735   PROTEINUR Negative 12/06/2023 1009   PROTEINUR NEGATIVE 06/21/2017 1735   UROBILINOGEN 0.2 01/27/2020 1124   UROBILINOGEN 1.0 05/18/2015 1359   NITRITE Negative 12/06/2023 1009   NITRITE NEGATIVE 06/21/2017 1735   LEUKOCYTESUR Trace (A) 12/06/2023 1009    Lab Results  Component Value Date   LABMICR See below: 12/06/2023   WBCUA 0-5 12/06/2023   LABEPIT 0-10 12/06/2023   MUCUS Present (A) 11/16/2022   BACTERIA Few 12/06/2023    Pertinent Imaging: *** Results for orders placed during the hospital encounter of 09/13/23  DG Abd 1 View  Narrative CLINICAL DATA:  Kidney stone, right flank pain.  EXAM: ABDOMEN - 1 VIEW  COMPARISON:  Radiograph 10/14/2022  FINDINGS: No visualized stones projecting over the renal beds or course of the ureters. Stable distribution of pelvic phleboliths. No evidence of bowel obstruction. Enteric sutures in the upper abdomen. Chronic prominent loop of bowel in the right abdomen, unchanged from prior.  IMPRESSION: No visualized urinary tract stones.   Electronically Signed By: Andrea Gasman M.D. On: 09/24/2023 06:55  No results found for this or any previous visit.  No results found for this or any previous visit.  No results found for this or any previous visit.  Results for orders placed during the hospital encounter of 06/07/24  Ultrasound renal complete  Narrative CLINICAL DATA:  Initial evaluation for nephrolithiasis.  EXAM: RENAL / URINARY TRACT ULTRASOUND COMPLETE  COMPARISON:  Prior ultrasound from 03/13/2023  FINDINGS: Right Kidney:  Renal measurements: 12.1 x 5.7 x 5.7 cm = volume: 204.2  mL. Renal echogenicity within normal limits. 3 mm nonobstructive calculus present at the lower pole. No hydronephrosis. No focal renal  mass.  Left Kidney:  Renal measurements: 13.8 x 5.3 x 5.6 cm = volume: 215.6 mL. Renal echogenicity within normal limits. 5 mm nonobstructive calculus present at the lower pole. No hydronephrosis. 1.6 x 0.9 x 1.3 cm simple cyst present at the interpolar region.  Bladder:  Appears normal for degree of bladder distention. Both jets are seen.  Other:  None.  IMPRESSION: 1. Bilateral nonobstructive nephrolithiasis as above. No hydronephrosis. 2. 1.6 cm simple cyst at the interpolar left kidney.   Electronically Signed By: Morene Hoard M.D. On: 06/13/2024 00:54  No results found for this or any previous visit.  No results found for this or any previous visit.  Results for orders placed during the hospital encounter of 10/13/21  CT RENAL STONE STUDY  Narrative CLINICAL DATA:  Right-sided flank pain and hematuria for 1 month. Nephrolithiasis.  EXAM: CT ABDOMEN AND PELVIS WITHOUT CONTRAST  TECHNIQUE: Multidetector CT imaging of the abdomen and pelvis was performed following the standard protocol without IV contrast.  RADIATION DOSE REDUCTION: This exam was performed according to the departmental dose-optimization program which includes automated exposure control, adjustment of the mA and/or kV according to patient size and/or use of iterative reconstruction technique.  COMPARISON:  01/01/2020  FINDINGS: Lower chest: No acute findings.  Hepatobiliary: No mass visualized on this unenhanced exam. Gallbladder is unremarkable. No evidence of biliary ductal dilatation.  Pancreas: No mass or inflammatory process visualized on this unenhanced exam.  Spleen:  Within normal limits in size.  Adrenals/Urinary tract: Several tiny less than 5 mm renal calculi are seen bilaterally. No evidence of ureteral calculi or hydronephrosis. Unremarkable unopacified urinary bladder.  Stomach/Bowel: Postop changes again seen from previous subtotal colectomy and sleeve  gastrectomy. No evidence of obstruction, inflammatory process, or abnormal fluid collections.  Vascular/Lymphatic: 14 x 11 mm mesenteric soft tissue nodule or lymph node is again seen in the right abdominal mesentery on image 64/2, stable since prior exam. No other pathologically enlarged lymph nodes identified. No evidence of abdominal aortic aneurysm.  Reproductive: Prior hysterectomy noted. Adnexal regions are unremarkable in appearance.  Other:  None.  Musculoskeletal: No suspicious bone lesions identified. Stable small sclerotic bone lesion in L4 vertebral body compared to previous CT in 2016, consistent with benign etiology.  IMPRESSION: Tiny bilateral renal calculi. No evidence of ureteral calculi, hydronephrosis, or other acute findings.  Stable small right abdominal mesenteric lymph node or soft tissue nodule. Recommend continued follow-up by CT in 6-12 months.   Electronically Signed By: Norleen DELENA Kil M.D. On: 10/13/2021 12:38   Assessment & Plan:    1. Nephrolithiasis (Primary) KUB today, will call with results - Urinalysis, Routine w reflex microscopic  2. OAB (overactive bladder) KUb, will call with results   No follow-ups on file.  Belvie Clara, MD  [facility] [place]

## 2024-06-24 ENCOUNTER — Encounter: Payer: Self-pay | Admitting: Urology

## 2024-06-24 NOTE — Telephone Encounter (Signed)
 KUB results in please review and advise

## 2024-06-25 ENCOUNTER — Encounter: Payer: Self-pay | Admitting: Urology

## 2024-06-25 NOTE — Patient Instructions (Signed)

## 2024-07-09 ENCOUNTER — Other Ambulatory Visit: Payer: Self-pay | Admitting: Urology

## 2024-07-09 DIAGNOSIS — N3281 Overactive bladder: Secondary | ICD-10-CM

## 2024-07-16 ENCOUNTER — Telehealth: Payer: Self-pay

## 2024-07-16 NOTE — Telephone Encounter (Signed)
 Patient needing to reschedule her November surgery.  Please advise.  Call: [phone]

## 2024-07-16 NOTE — Telephone Encounter (Signed)
 Communication documented in surgery workque

## 2024-07-22 ENCOUNTER — Ambulatory Visit

## 2024-08-05 DIAGNOSIS — G629 Polyneuropathy, unspecified: Secondary | ICD-10-CM | POA: Diagnosis not present

## 2024-08-05 DIAGNOSIS — G4730 Sleep apnea, unspecified: Secondary | ICD-10-CM | POA: Diagnosis not present

## 2024-08-05 DIAGNOSIS — E669 Obesity, unspecified: Secondary | ICD-10-CM | POA: Diagnosis not present

## 2024-08-05 DIAGNOSIS — F32A Depression, unspecified: Secondary | ICD-10-CM | POA: Diagnosis not present

## 2024-08-05 DIAGNOSIS — K219 Gastro-esophageal reflux disease without esophagitis: Secondary | ICD-10-CM | POA: Diagnosis not present

## 2024-08-05 DIAGNOSIS — K2100 Gastro-esophageal reflux disease with esophagitis, without bleeding: Secondary | ICD-10-CM | POA: Diagnosis not present

## 2024-08-05 DIAGNOSIS — F419 Anxiety disorder, unspecified: Secondary | ICD-10-CM | POA: Diagnosis not present

## 2024-08-05 DIAGNOSIS — K9589 Other complications of other bariatric procedure: Secondary | ICD-10-CM | POA: Diagnosis not present

## 2024-08-05 DIAGNOSIS — Z6836 Body mass index (BMI) 36.0-36.9, adult: Secondary | ICD-10-CM | POA: Diagnosis not present

## 2024-08-05 DIAGNOSIS — K760 Fatty (change of) liver, not elsewhere classified: Secondary | ICD-10-CM | POA: Diagnosis not present

## 2024-08-05 DIAGNOSIS — Z6832 Body mass index (BMI) 32.0-32.9, adult: Secondary | ICD-10-CM | POA: Diagnosis not present

## 2024-08-21 ENCOUNTER — Ambulatory Visit: Admitting: Urology

## 2024-09-06 ENCOUNTER — Ambulatory Visit

## 2024-09-11 ENCOUNTER — Ambulatory Visit: Admitting: Urology

## 2024-09-16 ENCOUNTER — Encounter: Payer: Self-pay | Admitting: Urology

## 2024-09-17 ENCOUNTER — Telehealth: Payer: Self-pay

## 2024-09-17 NOTE — Telephone Encounter (Signed)
 Attempted to call pt to schedule ua drop lvm to c/b to schedule

## 2024-10-07 ENCOUNTER — Ambulatory Visit

## 2024-10-17 ENCOUNTER — Other Ambulatory Visit: Payer: Self-pay | Admitting: Urology

## 2024-10-17 DIAGNOSIS — N200 Calculus of kidney: Secondary | ICD-10-CM

## 2024-10-29 ENCOUNTER — Other Ambulatory Visit: Payer: Self-pay

## 2024-10-29 ENCOUNTER — Other Ambulatory Visit

## 2024-10-29 ENCOUNTER — Telehealth: Payer: Self-pay | Admitting: Urology

## 2024-10-29 DIAGNOSIS — R3915 Urgency of urination: Secondary | ICD-10-CM

## 2024-10-29 DIAGNOSIS — R350 Frequency of micturition: Secondary | ICD-10-CM

## 2024-10-29 LAB — URINALYSIS, ROUTINE W REFLEX MICROSCOPIC
Glucose, UA: NEGATIVE
Nitrite, UA: NEGATIVE
Protein,UA: NEGATIVE
RBC, UA: NEGATIVE
Specific Gravity, UA: 1.02 (ref 1.005–1.030)
Urobilinogen, Ur: 0.2 mg/dL (ref 0.2–1.0)
pH, UA: 6 (ref 5.0–7.5)

## 2024-10-29 LAB — MICROSCOPIC EXAMINATION: Bacteria, UA: NONE SEEN

## 2024-10-29 NOTE — Telephone Encounter (Signed)
 Dysuria  Patient called with c/o dysuria x off and on since Decmber  Pain: burning  Severity:8/10  Associated Signs and Symptoms:  Fever: thinks she had fever yesterday Chills: yes Hematuria: no Urgency: yes Frequency: yes Hesitancy:yes Incontinence: no Nausea: yes Vomiting: no  Message sent to clinic staff to return call to patient to advise of plan.

## 2024-10-29 NOTE — Telephone Encounter (Signed)
" °  Urologic History:  Any Recent Urologic Surgeries or Procedures:no Recurrent UTI's:yes Cystitis: yes  Prostatitis:no Kidney or Bladder Stones: no Plan: Walk-in Clinic: no Appointment w/Physician: [no Lab visit scheduled for urine drop off: Yes Advice given:  Do you take on daily medications for UTI suppression No   "

## 2024-10-29 NOTE — Telephone Encounter (Signed)
 UA results reviewed by Dr. Sherrilee. Will wait for culture results to treat.

## 2024-10-30 NOTE — Telephone Encounter (Signed)
 Patient made aware and voiced understanding.

## 2024-10-31 LAB — URINE CULTURE

## 2024-11-01 ENCOUNTER — Ambulatory Visit: Payer: Self-pay

## 2024-11-01 NOTE — Telephone Encounter (Signed)
 Spoke with Nurse via phone call see other encounter.

## 2024-11-01 NOTE — Telephone Encounter (Signed)
 Patient called and made aware of negative urine culture. Patient voiced understanding.

## 2024-11-01 NOTE — Telephone Encounter (Signed)
 Patient had left a voicemail concerning her recent urine results. Reviewed urine results with Dr. Sherrilee (culture pending) UA per Dr. Sherrilee no suspicion of UTI.   Per Dr. Sherrilee patient has an interstim device implanted and would like to know more about her current symptoms.   Returned call to patient no. Answer. Left message to return call.

## 2024-11-04 ENCOUNTER — Ambulatory Visit

## 2024-11-14 ENCOUNTER — Encounter (HOSPITAL_COMMUNITY): Admission: RE | Payer: Self-pay | Source: Home / Self Care

## 2024-11-14 ENCOUNTER — Ambulatory Visit (HOSPITAL_COMMUNITY): Admission: RE | Admit: 2024-11-14 | Admitting: Urology

## 2024-11-14 SURGERY — REVISION, SACRAL NERVE STIMULATOR, INTERSTIM
Anesthesia: General

## 2024-11-27 ENCOUNTER — Ambulatory Visit: Admitting: Urology

## 2024-12-02 ENCOUNTER — Ambulatory Visit
